# Patient Record
Sex: Female | Born: 1970 | Race: White | Hispanic: No | Marital: Married | Smoking: Former smoker
Health system: Southern US, Community
[De-identification: ages and names within clinical notes are randomized; demographics above are authoritative.]

## PROBLEM LIST (undated history)

## (undated) DIAGNOSIS — I639 Cerebral infarction, unspecified: Secondary | ICD-10-CM

## (undated) DIAGNOSIS — G4700 Insomnia, unspecified: Secondary | ICD-10-CM

## (undated) DIAGNOSIS — F4310 Post-traumatic stress disorder, unspecified: Secondary | ICD-10-CM

## (undated) DIAGNOSIS — I10 Essential (primary) hypertension: Secondary | ICD-10-CM

## (undated) DIAGNOSIS — E079 Disorder of thyroid, unspecified: Secondary | ICD-10-CM

## (undated) DIAGNOSIS — M5124 Other intervertebral disc displacement, thoracic region: Secondary | ICD-10-CM

## (undated) DIAGNOSIS — F319 Bipolar disorder, unspecified: Secondary | ICD-10-CM

## (undated) DIAGNOSIS — M4714 Other spondylosis with myelopathy, thoracic region: Secondary | ICD-10-CM

## (undated) DIAGNOSIS — K219 Gastro-esophageal reflux disease without esophagitis: Secondary | ICD-10-CM

## (undated) DIAGNOSIS — N189 Chronic kidney disease, unspecified: Secondary | ICD-10-CM

## (undated) DIAGNOSIS — G894 Chronic pain syndrome: Secondary | ICD-10-CM

## (undated) DIAGNOSIS — M961 Postlaminectomy syndrome, not elsewhere classified: Secondary | ICD-10-CM

## (undated) DIAGNOSIS — G5600 Carpal tunnel syndrome, unspecified upper limb: Secondary | ICD-10-CM

## (undated) DIAGNOSIS — J45909 Unspecified asthma, uncomplicated: Secondary | ICD-10-CM

## (undated) DIAGNOSIS — F32A Depression, unspecified: Secondary | ICD-10-CM

## (undated) DIAGNOSIS — G4730 Sleep apnea, unspecified: Secondary | ICD-10-CM

## (undated) DIAGNOSIS — E039 Hypothyroidism, unspecified: Secondary | ICD-10-CM

## (undated) DIAGNOSIS — T7840XA Allergy, unspecified, initial encounter: Secondary | ICD-10-CM

## (undated) DIAGNOSIS — F17200 Nicotine dependence, unspecified, uncomplicated: Secondary | ICD-10-CM

## (undated) DIAGNOSIS — E119 Type 2 diabetes mellitus without complications: Secondary | ICD-10-CM

## (undated) DIAGNOSIS — J189 Pneumonia, unspecified organism: Secondary | ICD-10-CM

## (undated) DIAGNOSIS — M1990 Unspecified osteoarthritis, unspecified site: Secondary | ICD-10-CM

## (undated) DIAGNOSIS — F419 Anxiety disorder, unspecified: Secondary | ICD-10-CM

## (undated) HISTORY — PX: ABDOMINAL HYSTERECTOMY: SHX81

## (undated) HISTORY — PX: TONSILLECTOMY: SUR1361

## (undated) HISTORY — PX: KNEE ARTHROSCOPY: SUR90

## (undated) HISTORY — DX: Depression, unspecified: F32.A

## (undated) HISTORY — PX: SPINE SURGERY: SHX786

## (undated) HISTORY — DX: Sleep apnea, unspecified: G47.30

## (undated) HISTORY — PX: CHOLECYSTECTOMY: SHX55

## (undated) HISTORY — DX: Essential (primary) hypertension: I10

## (undated) HISTORY — DX: Anxiety disorder, unspecified: F41.9

## (undated) HISTORY — DX: Unspecified osteoarthritis, unspecified site: M19.90

## (undated) HISTORY — DX: Unspecified asthma, uncomplicated: J45.909

## (undated) HISTORY — DX: Allergy, unspecified, initial encounter: T78.40XA

## (undated) HISTORY — DX: Bipolar disorder, unspecified: F31.9

## (undated) HISTORY — DX: Post-traumatic stress disorder, unspecified: F43.10

## (undated) HISTORY — PX: JOINT REPLACEMENT: SHX530

---
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
## 1991-10-28 HISTORY — PX: KNEE ARTHROSCOPY: SUR90

## 1996-10-27 HISTORY — PX: KNEE ARTHROSCOPY: SHX127

## 2000-07-02 ENCOUNTER — Emergency Department (HOSPITAL_COMMUNITY): Admission: EM | Admit: 2000-07-02 | Discharge: 2000-07-02 | Payer: Self-pay | Admitting: Emergency Medicine

## 2011-06-08 HISTORY — PX: CHOLECYSTECTOMY: SHX55

## 2011-12-03 HISTORY — PX: ABDOMINAL HYSTERECTOMY: SHX81

## 2019-10-28 HISTORY — PX: LUMBAR LAMINECTOMY: SHX95

## 2019-10-28 HISTORY — PX: BACK SURGERY: SHX140

## 2020-05-23 ENCOUNTER — Ambulatory Visit: Payer: Self-pay | Attending: Internal Medicine

## 2020-05-23 DIAGNOSIS — Z20822 Contact with and (suspected) exposure to covid-19: Secondary | ICD-10-CM

## 2020-05-24 LAB — SARS-COV-2, NAA 2 DAY TAT

## 2020-05-24 LAB — NOVEL CORONAVIRUS, NAA: SARS-CoV-2, NAA: NOT DETECTED

## 2020-06-24 ENCOUNTER — Encounter: Payer: Self-pay | Admitting: Emergency Medicine

## 2020-06-24 ENCOUNTER — Other Ambulatory Visit: Payer: Self-pay

## 2020-06-24 ENCOUNTER — Ambulatory Visit
Admission: EM | Admit: 2020-06-24 | Discharge: 2020-06-24 | Disposition: A | Discharge status: Home / Self Care | Payer: 59 | Attending: Family Medicine | Admitting: Family Medicine

## 2020-06-24 DIAGNOSIS — Z79899 Other long term (current) drug therapy: Secondary | ICD-10-CM | POA: Diagnosis not present

## 2020-06-24 DIAGNOSIS — Z20822 Contact with and (suspected) exposure to covid-19: Secondary | ICD-10-CM | POA: Diagnosis not present

## 2020-06-24 DIAGNOSIS — K219 Gastro-esophageal reflux disease without esophagitis: Secondary | ICD-10-CM | POA: Insufficient documentation

## 2020-06-24 DIAGNOSIS — R112 Nausea with vomiting, unspecified: Secondary | ICD-10-CM | POA: Insufficient documentation

## 2020-06-24 DIAGNOSIS — E119 Type 2 diabetes mellitus without complications: Secondary | ICD-10-CM | POA: Insufficient documentation

## 2020-06-24 DIAGNOSIS — B349 Viral infection, unspecified: Secondary | ICD-10-CM | POA: Diagnosis not present

## 2020-06-24 DIAGNOSIS — E079 Disorder of thyroid, unspecified: Secondary | ICD-10-CM | POA: Insufficient documentation

## 2020-06-24 DIAGNOSIS — Z87891 Personal history of nicotine dependence: Secondary | ICD-10-CM | POA: Insufficient documentation

## 2020-06-24 DIAGNOSIS — H9202 Otalgia, left ear: Secondary | ICD-10-CM | POA: Insufficient documentation

## 2020-06-24 DIAGNOSIS — R0989 Other specified symptoms and signs involving the circulatory and respiratory systems: Secondary | ICD-10-CM | POA: Diagnosis not present

## 2020-06-24 DIAGNOSIS — Z7984 Long term (current) use of oral hypoglycemic drugs: Secondary | ICD-10-CM | POA: Insufficient documentation

## 2020-06-24 HISTORY — DX: Disorder of thyroid, unspecified: E07.9

## 2020-06-24 HISTORY — DX: Gastro-esophageal reflux disease without esophagitis: K21.9

## 2020-06-24 HISTORY — DX: Type 2 diabetes mellitus without complications: E11.9

## 2020-06-24 MED ORDER — PROMETHAZINE HCL 25 MG PO TABS: 25.0000 mg | ORAL_TABLET | Freq: Four times a day (QID) | ORAL | 0 refills | Status: DC | PRN

## 2020-06-24 MED ORDER — ONDANSETRON 4 MG PO TBDP: 4.0000 mg | ORAL_TABLET | Freq: Three times a day (TID) | ORAL | 0 refills | Status: DC | PRN

## 2020-06-24 MED ORDER — NEOMYCIN-POLYMYXIN-HC 3.5-10000-1 OT SUSP: 4.0000 [drp] | Freq: Three times a day (TID) | OTIC | 0 refills | Status: AC

## 2020-06-24 NOTE — Discharge Instructions (Addendum)
Rest.  Fluids.  Meds as prescribed.  Call health at work.  Take care  Dr. Lacinda Axon

## 2020-06-24 NOTE — ED Provider Notes (Signed)
[facility]    CSN: [id number] Arrival date & time: 06/24/20  0177      History   Chief Complaint Chief Complaint  Patient presents with   Nausea   Emesis   Otalgia   HPI  49 year old female presents with the above complaints.  Patient reports that she developed nausea and vomiting which started on Saturday.  She had 2 episodes of emesis.  She had an episode of nausea vomiting again today.  Unclear inciting factor.  No fever.  No chills.  No reports of abdominal pain.  No diarrhea.  She does note that she has a scratchy throat.  She reports that she has had left ear pain as of today as well.  She denies respiratory symptoms.  She has taken Zofran without relief.  No other complaints or concerns at this time.  Past Medical History:  Diagnosis Date   Diabetes mellitus without complication (HCC)    GERD (gastroesophageal reflux disease)    Thyroid disease    Past Surgical History:  Procedure Laterality Date   ABDOMINAL HYSTERECTOMY     CHOLECYSTECTOMY     TONSILLECTOMY     OB History   No obstetric history on file.    Home Medications    Prior to Admission medications   Medication Sig Start Date End Date Taking? Authorizing Provider  cholecalciferol (VITAMIN D3) 25 MCG (1000 UNIT) tablet Take 1,000 Units by mouth daily.   Yes [provider]  levothyroxine (SYNTHROID) 50 MCG tablet Take 50 mcg by mouth daily before breakfast.   Yes [provider]  metFORMIN (GLUCOPHAGE) 1000 MG tablet Take 1,000 mg by mouth daily with breakfast.   Yes [provider]  pantoprazole (PROTONIX) 40 MG tablet Take 40 mg by mouth daily.   Yes [provider]  QUEtiapine (SEROQUEL) 300 MG tablet Take 300 mg by mouth at bedtime.   Yes [provider]  rosuvastatin (CRESTOR) 5 MG tablet Take 5 mg by mouth daily.   Yes [provider]  sertraline (ZOLOFT) 100 MG tablet Take 100 mg by mouth daily.   Yes [provider]  sitaGLIPtin (JANUVIA) 100 MG tablet Take 100 mg by mouth daily.   Yes [provider]  zolpidem (AMBIEN) 10 MG tablet Take 10 mg by mouth at bedtime as needed for sleep.   Yes [provider]  neomycin-polymyxin-hydrocortisone (CORTISPORIN) 3.5-10000-1 OTIC suspension Place 4 drops into the left ear 3 (three) times daily for 7 days. 06/24/20 07/01/20  Coral Spikes, DO  ondansetron (ZOFRAN ODT) 4 MG disintegrating tablet Take 1 tablet (4 mg total) by mouth every 8 (eight) hours as needed for nausea or vomiting. 06/24/20   Coral Spikes, DO  promethazine (PHENERGAN) 25 MG tablet Take 1 tablet (25 mg total) by mouth every 6 (six) hours as needed for refractory nausea / vomiting. 06/24/20   Coral Spikes, DO    Family History Family History  Problem Relation Age of Onset   Hypertension Mother     Social History Social History   Tobacco Use   Smoking status: Former Smoker   Smokeless tobacco: Never Used  Scientific laboratory technician Use: Never used  Substance Use Topics   Alcohol use: Not Currently   Drug use: Not on file     Allergies   Sulfa antibiotics   Review of Systems Review of Systems  Constitutional: Negative for fever.  HENT: Positive for ear pain.   Gastrointestinal: Positive for  nausea and vomiting.   Physical Exam Triage Vital Signs ED Triage Vitals  Enc Vitals Group     BP 06/24/20 0941 120/66     Pulse Rate 06/24/20 0941 86     Resp 06/24/20 0941 14     Temp 06/24/20 0941 98.4 F (36.9 C)     Temp Source 06/24/20 0941 Oral     SpO2 06/24/20 0941 96 %     Weight 06/24/20 0936 172 lb (78 kg)     Height 06/24/20 0936 5\' 4"  (1.626 m)     Head Circumference --      Peak Flow --      Pain Score 06/24/20 0935 3     Pain Loc --      Pain Edu? --      Excl. in [place]? --    Updated Vital Signs BP 120/66 (BP Location: Left Arm)    Pulse 86    Temp 98.4 F (36.9 C) (Oral)    Resp 14    Ht 5\' 4"  (1.626 m)    Wt 78 kg    SpO2 96%    BMI  29.52 kg/m   Visual Acuity Right Eye Distance:   Left Eye Distance:   Bilateral Distance:    Right Eye Near:   Left Eye Near:    Bilateral Near:     Physical Exam Vitals and nursing note reviewed.  Constitutional:      General: She is not in acute distress.    Appearance: Normal appearance. She is not ill-appearing.  HENT:     Head: Normocephalic and atraumatic.  Eyes:     General:        Right eye: No discharge.        Left eye: No discharge.     Conjunctiva/sclera: Conjunctivae normal.  Cardiovascular:     Rate and Rhythm: Normal rate.     Heart sounds: No murmur heard.   Pulmonary:     Effort: Pulmonary effort is normal.     Breath sounds: Normal breath sounds. No wheezing, rhonchi or rales.  Abdominal:     General: There is no distension.     Palpations: Abdomen is soft.     Comments: Epigastric tenderness to palpation.  Neurological:     Mental Status: She is alert.  Psychiatric:        Mood and Affect: Mood normal.        Behavior: Behavior normal.    UC Treatments / Results  Labs (all labs ordered are listed, but only abnormal results are displayed) Labs Reviewed  SARS CORONAVIRUS 2 (TAT 6-24 HRS)    EKG   Radiology No results found.  Procedures Procedures (including critical care time)  Medications Ordered in UC Medications - No data to display  Initial Impression / Assessment and Plan / UC Course  I have reviewed the triage vital signs and the nursing notes.  Pertinent labs & imaging results that were available during my care of the patient were reviewed by me and considered in my medical decision making (see chart for details).    49 year old female presents with suspected viral illness.  Zofran as needed for nausea and vomiting.  Phenergan for refractory nausea/vomiting.  Cortisporin otic for the left ear. Awaiting COVID test results.   Final Clinical Impressions(s) / UC Diagnoses   Final diagnoses:  Viral illness     Discharge  Instructions     Rest.  Fluids.  Meds as prescribed.  Call health  at work.  Take care  Dr. Lacinda Axon    ED Prescriptions    Medication Sig Dispense Auth. Provider   promethazine (PHENERGAN) 25 MG tablet Take 1 tablet (25 mg total) by mouth every 6 (six) hours as needed for refractory nausea / vomiting. 30 tablet Gennavieve Huq G, DO   ondansetron (ZOFRAN ODT) 4 MG disintegrating tablet Take 1 tablet (4 mg total) by mouth every 8 (eight) hours as needed for nausea or vomiting. 20 tablet Starlin Steib G, DO   neomycin-polymyxin-hydrocortisone (CORTISPORIN) 3.5-10000-1 OTIC suspension Place 4 drops into the left ear 3 (three) times daily for 7 days. 10 mL Coral Spikes, DO     PDMP not reviewed this encounter.   Coral Spikes, DO 06/24/20 1012

## 2020-06-24 NOTE — ED Triage Notes (Signed)
Patient c/o N/V that started Saturday.  Patient c/o left ear pain that started today.  Patient denies fevers.

## 2020-06-25 LAB — SARS CORONAVIRUS 2 (TAT 6-24 HRS): SARS Coronavirus 2: NEGATIVE

## 2020-07-13 ENCOUNTER — Telehealth: Payer: Self-pay

## 2020-07-13 NOTE — Telephone Encounter (Signed)
Copied from [place] [id number]. Topic: Appointment Scheduling - Scheduling Inquiry for Clinic >> Jul 12, 2020 10:06 AM Scherrie Gerlach wrote: Reason for CRM: Timia Casselman is married to this pt, and Lattie Haw sees Dr Caryn Section.  Pt is a [employer] employee. Lattie Haw would like to know if anyone there would be willing to take this pt on as a new pt?

## 2020-07-13 NOTE — Telephone Encounter (Signed)
Dr. Caryn Section is not accepting any new patients. Are there any other providers in the office who are accepting new patients? Please let this patient know.

## 2020-07-16 NOTE — Telephone Encounter (Signed)
Appt. Made with Laverna Peace, FNP for 08/01/20 at 3:00 p.m.

## 2020-07-31 ENCOUNTER — Other Ambulatory Visit: Payer: Self-pay

## 2020-07-31 ENCOUNTER — Ambulatory Visit
Admission: EM | Admit: 2020-07-31 | Discharge: 2020-07-31 | Disposition: A | Discharge status: Home / Self Care | Payer: 59 | Attending: Internal Medicine | Admitting: Internal Medicine

## 2020-07-31 DIAGNOSIS — S336XXA Sprain of sacroiliac joint, initial encounter: Secondary | ICD-10-CM

## 2020-07-31 MED ORDER — METHOCARBAMOL 500 MG PO TABS: 500.0000 mg | ORAL_TABLET | Freq: Two times a day (BID) | ORAL | 0 refills | Status: DC

## 2020-07-31 MED ORDER — IBUPROFEN 600 MG PO TABS: 600.0000 mg | ORAL_TABLET | Freq: Four times a day (QID) | ORAL | 0 refills | Status: DC | PRN

## 2020-07-31 MED ORDER — KETOROLAC TROMETHAMINE 60 MG/2ML IM SOLN
60.0000 mg | Freq: Once | INTRAMUSCULAR | Status: AC
  Administered 2020-07-31: 60 mg via INTRAMUSCULAR

## 2020-07-31 NOTE — ED Triage Notes (Signed)
Pt reports on Sunday a donkey bucked her and she injured her low back.

## 2020-07-31 NOTE — ED Provider Notes (Signed)
[facility]    CSN: [id number] Arrival date & time: 07/31/20  1741      History   Chief Complaint Chief Complaint  Patient presents with   Back Pain    HPI Kathleen Carr is a 49 y.o. female.   49 yo female here for left low back pain after being hit by her donkey. She says she was feeding the horses and did not see the donkey behind her. The donkey hit her and she fell back jarring her back. She has been using Lidocaine patches and getting about 3 hours of relief.      Past Medical History:  Diagnosis Date   Diabetes mellitus without complication (HCC)    GERD (gastroesophageal reflux disease)    Thyroid disease     There are no problems to display for this patient.   Past Surgical History:  Procedure Laterality Date   ABDOMINAL HYSTERECTOMY     CHOLECYSTECTOMY     TONSILLECTOMY      OB History   No obstetric history on file.      Home Medications    Prior to Admission medications   Medication Sig Start Date End Date Taking? Authorizing Provider  cholecalciferol (VITAMIN D3) 25 MCG (1000 UNIT) tablet Take 1,000 Units by mouth daily.    [provider]  levothyroxine (SYNTHROID) 50 MCG tablet Take 50 mcg by mouth daily before breakfast.    [provider]  metFORMIN (GLUCOPHAGE) 1000 MG tablet Take 1,000 mg by mouth daily with breakfast.    [provider]  ondansetron (ZOFRAN ODT) 4 MG disintegrating tablet Take 1 tablet (4 mg total) by mouth every 8 (eight) hours as needed for nausea or vomiting. 06/24/20   Coral Spikes, DO  pantoprazole (PROTONIX) 40 MG tablet Take 40 mg by mouth daily.    [provider]  promethazine (PHENERGAN) 25 MG tablet Take 1 tablet (25 mg total) by mouth every 6 (six) hours as needed for refractory nausea / vomiting. 06/24/20   Coral Spikes, DO  QUEtiapine (SEROQUEL) 300 MG tablet Take 300 mg by mouth at bedtime.    [provider]  rosuvastatin (CRESTOR) 5 MG tablet Take  5 mg by mouth daily.    [provider]  sertraline (ZOLOFT) 100 MG tablet Take 100 mg by mouth daily.    [provider]  sitaGLIPtin (JANUVIA) 100 MG tablet Take 100 mg by mouth daily.    [provider]  zolpidem (AMBIEN) 10 MG tablet Take 10 mg by mouth at bedtime as needed for sleep.    [provider]    Family History Family History  Problem Relation Age of Onset   Hypertension Mother     Social History Social History   Tobacco Use   Smoking status: Former Smoker   Smokeless tobacco: Never Used  Scientific laboratory technician Use: Never used  Substance Use Topics   Alcohol use: Not Currently   Drug use: Not on file     Allergies   Sulfa antibiotics   Review of Systems Review of Systems  Constitutional: Negative for activity change and fever.  HENT: Negative for congestion and rhinorrhea.   Respiratory: Negative for cough.   Cardiovascular: Negative for chest pain.  Gastrointestinal: Negative for nausea and vomiting.  Musculoskeletal: Positive for back pain and myalgias. Negative for arthralgias and joint swelling.  Skin: Negative.   Neurological: Negative for weakness and numbness.  Hematological: Negative.   Psychiatric/Behavioral: Negative.  Physical Exam Triage Vital Signs ED Triage Vitals  Enc Vitals Group     BP 07/31/20 1752 (!) 131/94     Pulse Rate 07/31/20 1752 75     Resp 07/31/20 1752 18     Temp 07/31/20 1752 98.6 F (37 C)     Temp Source 07/31/20 1752 Oral     SpO2 07/31/20 1752 98 %     Weight 07/31/20 1751 171 lb 15.3 oz (78 kg)     Height 07/31/20 1751 5\' 4"  (1.626 m)     Head Circumference --      Peak Flow --      Pain Score 07/31/20 1750 8     Pain Loc --      Pain Edu? --      Excl. in [place]? --    No data found.  Updated Vital Signs BP (!) 131/94 (BP Location: Left Arm)    Pulse 75    Temp 98.6 F (37 C) (Oral)    Resp 18    Ht 5\' 4"  (1.626 m)    Wt 171 lb 15.3 oz (78 kg)    SpO2 98%     BMI 29.52 kg/m   Visual Acuity Right Eye Distance:   Left Eye Distance:   Bilateral Distance:    Right Eye Near:   Left Eye Near:    Bilateral Near:     Physical Exam Vitals and nursing note reviewed.  Constitutional:      General: She is not in acute distress.    Appearance: Normal appearance.  HENT:     Head: Normocephalic and atraumatic.  Pulmonary:     Effort: Pulmonary effort is normal.     Breath sounds: Normal breath sounds.  Musculoskeletal:     Comments: There is no midline tenderness noted in lumbar of sacral spine. There is soft tissue swelling over the posterior, superior sacroiliac ligament  Neurological:     Mental Status: She is alert.      UC Treatments / Results  Labs (all labs ordered are listed, but only abnormal results are displayed) Labs Reviewed - No data to display  EKG   Radiology No results found.  Procedures Procedures (including critical care time)  Medications Ordered in UC Medications  ketorolac (TORADOL) injection 60 mg (60 mg Intramuscular Given 07/31/20 1819)    Initial Impression / Assessment and Plan / UC Course  I have reviewed the triage vital signs and the nursing notes.  Pertinent labs & imaging results that were available during my care of the patient were reviewed by me and considered in my medical decision making (see chart for details).   Patient is here for evaluation of left low back pain that started this weekend after being bucked by a donkey. She has normal gait, no bony tenderness. On exam her sacroiliac ligament is inflamed.  Will D/c home with Ibuprofen, moist heat, and stretches.    Final Clinical Impressions(s) / UC Diagnoses   Final diagnoses:  Sacroiliac (ligament) sprain, initial encounter     Discharge Instructions     Take Ibuprofen 600 mg every 6 hours as needed for pain.  Apply moist heat for 20 minutes at a time, 2-3 times a day.  Continue to use the Lidocaine patches.  Return for  worsening symptoms.     ED Prescriptions    None     PDMP not reviewed this encounter.   Margarette Canada, NP 07/31/20 [phone]

## 2020-07-31 NOTE — Discharge Instructions (Addendum)
Take Ibuprofen 600 mg every 6 hours as needed for pain.  Apply moist heat for 20 minutes at a time, 2-3 times a day.  Continue to use the Lidocaine patches.  Return for worsening symptoms.

## 2020-08-01 ENCOUNTER — Ambulatory Visit (INDEPENDENT_AMBULATORY_CARE_PROVIDER_SITE_OTHER): Payer: 59 | Admitting: Adult Health

## 2020-08-01 ENCOUNTER — Encounter: Payer: Self-pay | Admitting: Adult Health

## 2020-08-01 VITALS — BP 124/97 | HR 77 | Temp 97.5°F | Resp 16 | Wt 178.4 lb

## 2020-08-01 DIAGNOSIS — M545 Low back pain, unspecified: Secondary | ICD-10-CM

## 2020-08-01 DIAGNOSIS — Z6830 Body mass index (BMI) 30.0-30.9, adult: Secondary | ICD-10-CM

## 2020-08-01 DIAGNOSIS — S3992XA Unspecified injury of lower back, initial encounter: Secondary | ICD-10-CM

## 2020-08-01 DIAGNOSIS — S7002XA Contusion of left hip, initial encounter: Secondary | ICD-10-CM | POA: Diagnosis not present

## 2020-08-01 LAB — POCT URINALYSIS DIPSTICK (MANUAL)
Leukocytes, UA: NEGATIVE
Nitrite, UA: NEGATIVE
Poct Bilirubin: NEGATIVE
Poct Blood: NEGATIVE
Poct Glucose: NORMAL mg/dL
Poct Ketones: NEGATIVE
Poct Protein: NEGATIVE mg/dL
Poct Urobilinogen: NORMAL mg/dL
Spec Grav, UA: <=1.005 — AB (ref 1.010–1.025)
pH, UA: 6 (ref 5.0–8.0)

## 2020-08-01 NOTE — Progress Notes (Addendum)
New patient visit   Patient: Kathleen Carr   DOB: 1971/05/11   49 y.o. Female  MRN: [id number] Visit Date: 08/01/2020  Today's healthcare provider: Marcille Buffy, FNP   Chief Complaint  Patient presents with  . New Patient (Initial Visit)   Subjective    Kathleen Carr is a 49 y.o. female who presents today as a new patient to establish care.  HPI  Patient comes to our office from [place], she states that she feels fairly well today but would like to address acute back pain. Patient reports that 4 days ago a  [place] had ran into the back of her, she reports being seen at [facility]gent care last night and given injection of Torodal and prescribed Methocarbamol 500mg . Patient reports today that pain is still present and medication prescribed has not helped with pain in lumbar area.   She moved here in June from [place].   She reports her donkey ran into her on  07/29/2020 while she was feeding her donkey. She was seen at the [facility] urgent care on 07/31/2020 and given Robaxin and Toradol and Ibuprofen of which she has not taken Ibuprfen today.  She was not x rayed at urgent care. Felt no relief from Robaxin. She reprts pain in her left groin when she is turning her back.  Numbness into left thigh and buttock she reports is always there and never goes away.  Kathleen Carr is a 49 y.o. female who presents today as a new patient to establish care.  HPI  Patient comes to our office from [place], she states that she feels fairly well today but would like to address acute back pain. Patient reports that 4 days ago a  [place] had ran into the back of her, she reports being seen at [facility] last night and given injection of Torodal and prescribed Methocarbamol 500mg . Patient reports today that pain is still present and medication prescribed has not helped with pain in lumbar area.   She moved here in June from [place].   She reports her donkey ran into her on  07/29/2020 while she was  feeding her donkey. She was seen at the [facility] urgent care on 07/31/2020 and given Robaxin and Toradol and Ibuprofen of which she has not taken Ibuprfen today.  She was not x rayed at urgent care. Felt no relief from Robaxin. She reprts pain in her left groin when she is turning her back.  Numbness into left thigh and buttock she reports is always there and never goes away. She reports she has felt a crunching in her lower back.   Patient  denies any fever, chills, rash, chest pain, shortness of breath, nausea, vomiting, or diarrhea.  Denies dizziness, lightheadedness, pre syncopal or syncopal episodes.    Past Medical History:  Diagnosis Date  . Allergy   . Anxiety   . Arthritis   . Asthma   . Bipolar depression ([place])   . Depression   . Diabetes mellitus without complication ([place])   . GERD (gastroesophageal reflux disease)   . Hypertension   . PTSD (post-traumatic stress disorder)   . PTSD (post-traumatic stress disorder)   . Thyroid disease    Past Surgical History:  Procedure Laterality Date  . ABDOMINAL HYSTERECTOMY    . CHOLECYSTECTOMY    . TONSILLECTOMY     Family Status  Relation Name Status  . Mother  Alive  . Father  Alive  . MGM  (Not  Specified)  . MGF  (Not Specified)  . PGF  (Not Specified)   Family History  Problem Relation Age of Onset  . Hypertension Mother   . Thyroid disease Mother   . Ulcerative colitis Father   . Glaucoma Maternal Grandmother   . Renal Disease Maternal Grandfather   . Diabetes Maternal Grandfather        Type2  . Stroke Paternal Grandfather   . Hypertension Paternal Grandfather    Social History   Socioeconomic History  . Marital status: Married    Spouse name: Not on file  . Number of children: Not on file  . Years of education: Not on file  . Highest education level: Not on file  Occupational History  . Not on file  Tobacco Use  . Smoking status: Former Smoker    Quit date: 05/25/2020    Years since quitting: 0.1  .  Smokeless tobacco: Never Used  Vaping Use  . Vaping Use: Never used  Substance and Sexual Activity  . Alcohol use: Not Currently  . Drug use: Not on file  . Sexual activity: Not on file  Other Topics Concern  . Not on file  Social History Narrative  . Not on file   Social Determinants of Health   Financial Resource Strain:   . Difficulty of Paying Living Expenses: Not on file  Food Insecurity:   . Worried About Charity fundraiser in the Last Year: Not on file  . Ran Out of Food in the Last Year: Not on file  Transportation Needs:   . Lack of Transportation (Medical): Not on file  . Lack of Transportation (Non-Medical): Not on file  Physical Activity:   . Days of Exercise per Week: Not on file  . Minutes of Exercise per Session: Not on file  Stress:   . Feeling of Stress : Not on file  Social Connections:   . Frequency of Communication with Friends and Family: Not on file  . Frequency of Social Gatherings with Friends and Family: Not on file  . Attends Religious Services: Not on file  . Active Member of Clubs or Organizations: Not on file  . Attends Archivist Meetings: Not on file  . Marital Status: Not on file   Outpatient Medications Prior to Visit  Medication Sig  . cholecalciferol (VITAMIN D3) 25 MCG (1000 UNIT) tablet Take 1,000 Units by mouth daily.  Marland Kitchen ibuprofen (ADVIL) 600 MG tablet Take 1 tablet (600 mg total) by mouth every 6 (six) hours as needed.  Marland Kitchen levothyroxine (SYNTHROID) 50 MCG tablet Take 50 mcg by mouth daily before breakfast.  . metFORMIN (GLUCOPHAGE) 1000 MG tablet Take 1,000 mg by mouth daily with breakfast.  . methocarbamol (ROBAXIN) 500 MG tablet Take 1 tablet (500 mg total) by mouth 2 (two) times daily.  . pantoprazole (PROTONIX) 40 MG tablet Take 40 mg by mouth daily.  . QUEtiapine (SEROQUEL) 300 MG tablet Take 300 mg by mouth at bedtime.  . rosuvastatin (CRESTOR) 5 MG tablet Take 5 mg by mouth daily.  . sertraline (ZOLOFT) 100 MG tablet  Take 100 mg by mouth daily.  . sitaGLIPtin (JANUVIA) 100 MG tablet Take 100 mg by mouth daily.  Marland Kitchen zolpidem (AMBIEN) 10 MG tablet Take 10 mg by mouth at bedtime as needed for sleep.  . [DISCONTINUED] ondansetron (ZOFRAN ODT) 4 MG disintegrating tablet Take 1 tablet (4 mg total) by mouth every 8 (eight) hours as needed for nausea or vomiting.  . [  DISCONTINUED] promethazine (PHENERGAN) 25 MG tablet Take 1 tablet (25 mg total) by mouth every 6 (six) hours as needed for refractory nausea / vomiting.   No facility-administered medications prior to visit.   Allergies  Allergen Reactions  . Dust Mite Mixed Allergen Ext [Mite (D. Farinae)]     Respiratory distresss  . Other     Allergy to Hickory, walnut and Birch trees and all grasses and allergic to Rabbits- patient reports anaphylactic   . Sulfa Antibiotics Hives     There is no immunization history on file for this patient.  Health Maintenance  Topic Date Due  . COVID-19 Vaccine (1) Never done  . PAP SMEAR-Modifier  08/05/2020 (Originally 09/23/1992)  . INFLUENZA VACCINE  01/24/2021 (Originally 05/27/2020)  . TETANUS/TDAP  08/05/2021 (Originally 09/23/1990)  . Hepatitis C Screening  08/05/2021 (Originally October 21, 1971)  . HIV Screening  08/05/2021 (Originally 09/23/1986)    Patient Care Team: Diontre Harps, Kelby Aline, FNP as PCP - General (Family Medicine)  Review of Systems  Constitutional: Negative.   HENT: Negative.   Respiratory: Negative.   Cardiovascular: Negative.   Gastrointestinal: Negative.   Genitourinary: Positive for urgency. Negative for decreased urine volume, difficulty urinating, dyspareunia, dysuria, enuresis, flank pain, frequency, genital sores, hematuria, menstrual problem, pelvic pain and vaginal bleeding.  Musculoskeletal: Positive for back pain. Negative for arthralgias, gait problem, joint swelling, myalgias, neck pain and neck stiffness.  Allergic/Immunologic: Positive for environmental allergies.  Neurological:  Positive for numbness. Negative for dizziness, tremors, seizures, syncope, facial asymmetry, speech difficulty, weakness, light-headedness and headaches.  Hematological: Negative.   Psychiatric/Behavioral: Negative.   All other systems reviewed and are negative.     Objective    BP (!) 124/97   Pulse 77   Temp (!) 97.5 F (36.4 C) (Oral)   Resp 16   Wt 178 lb 6.4 oz (80.9 kg)   SpO2 100%   BMI 30.62 kg/m  Physical Exam BP (!) 124/97   Pulse 77   Temp (!) 97.5 F (36.4 C) (Oral)   Resp 16   Wt 178 lb 6.4 oz (80.9 kg)   SpO2 100%   BMI 30.62 kg/m  Physical Exam Vitals reviewed.  Constitutional:      General: She is not in acute distress.    Appearance: Normal appearance. She is not ill-appearing, toxic-appearing or diaphoretic.  HENT:     Head: Normocephalic and atraumatic.     Right Ear: Tympanic membrane, ear canal and external ear normal. There is no impacted cerumen.     Left Ear: Tympanic membrane, ear canal and external ear normal.     Nose: Nose normal. No congestion or rhinorrhea.     Mouth/Throat:     Mouth: Mucous membranes are moist.     Pharynx: No oropharyngeal exudate or posterior oropharyngeal erythema.  Eyes:     General: No scleral icterus.       Right eye: No discharge.        Left eye: No discharge.     Extraocular Movements: Extraocular movements intact.     Conjunctiva/sclera: Conjunctivae normal.     Pupils: Pupils are equal, round, and reactive to light.  Neck:     Vascular: No carotid bruit.  Cardiovascular:     Rate and Rhythm: Normal rate and regular rhythm.     Pulses: Normal pulses.     Heart sounds: Normal heart sounds. No murmur heard.  No friction rub. No gallop.   Pulmonary:     Effort: Pulmonary effort  is normal. No respiratory distress.     Breath sounds: Normal breath sounds. No stridor. No wheezing, rhonchi or rales.  Chest:     Chest wall: No tenderness.  Abdominal:     General: There is no distension.     Palpations:  Abdomen is soft.     Tenderness: There is no abdominal tenderness. There is no right CVA tenderness or left CVA tenderness.  Genitourinary:    Comments: Patient is alert and oriented and responsive to questions Engages in eye contact with provider. Speaks in full sentences without any pauses without any shortness of breath or distress.   Musculoskeletal:        General: Tenderness and signs of injury present.     Cervical back: Normal, normal range of motion and neck supple. No rigidity or tenderness.     Thoracic back: Normal.     Lumbar back: No swelling, edema, deformity, signs of trauma, lacerations, spasms or bony tenderness. Decreased range of motion. Positive left straight leg raise test (patient grimaces and hollars with any raise. ). Negative right straight leg raise test. No scoliosis.     Right lower leg: No edema.     Left lower leg: No edema.     Comments: No bruising or rash noted.  She is able to get on exam table.   Lymphadenopathy:     Cervical: No cervical adenopathy.  Skin:    General: Skin is warm.     Capillary Refill: Capillary refill takes less than 2 seconds.     Findings: No erythema or rash.  Neurological:     General: No focal deficit present.     Mental Status: She is alert and oriented to person, place, and time.     Coordination: Coordination normal.     Deep Tendon Reflexes: Reflexes normal.  Psychiatric:        Mood and Affect: Mood normal.        Behavior: Behavior normal.        Thought Content: Thought content normal.        Judgment: Judgment normal.    Depression Screen PHQ 2/9 Scores 08/01/2020  PHQ - 2 Score 0  PHQ- 9 Score 3   Results for orders placed or performed in visit on 08/01/20  TSH  Result Value Ref Range   TSH 2.420 0.450 - 4.500 uIU/mL  CBC with Differential/Platelet  Result Value Ref Range   WBC 10.1 3.4 - 10.8 x10E3/uL   RBC 4.68 3.77 - 5.28 x10E6/uL   Hemoglobin 15.2 11.1 - 15.9 g/dL   Hematocrit 44.0 34.0 - 46.6 %     MCV 94 79 - 97 fL   MCH 32.5 26.6 - 33.0 pg   MCHC 34.5 31 - 35 g/dL   RDW 12.1 11.7 - 15.4 %   Platelets 409 150 - 450 x10E3/uL   Neutrophils 39 Not Estab. %   Lymphs 51 Not Estab. %   Monocytes 6 Not Estab. %   Eos 2 Not Estab. %   Basos 1 Not Estab. %   Neutrophils Absolute 4.0 1 - 7 x10E3/uL   Lymphocytes Absolute 5.3 (H) 0 - 3 x10E3/uL   Monocytes Absolute 0.6 0 - 0 x10E3/uL   EOS (ABSOLUTE) 0.2 0.0 - 0.4 x10E3/uL   Basophils Absolute 0.1 0 - 0 x10E3/uL   Immature Granulocytes 1 Not Estab. %   Immature Grans (Abs) 0.1 0.0 - 0.1 x10E3/uL  Comprehensive Metabolic Panel (CMET)  Result Value Ref Range  Glucose 92 65 - 99 mg/dL   BUN 13 6 - 24 mg/dL   Creatinine, Ser 0.88 0.57 - 1.00 mg/dL   GFR calc non Af Amer 78 >59 mL/min/1.73   GFR calc Af Amer 90 >59 mL/min/1.73   BUN/Creatinine Ratio 15 9 - 23   Sodium 137 134 - 144 mmol/L   Potassium 4.7 3.5 - 5.2 mmol/L   Chloride 100 96 - 106 mmol/L   CO2 22 20 - 29 mmol/L   Calcium 9.8 8.7 - 10.2 mg/dL   Total Protein 7.2 6.0 - 8.5 g/dL   Albumin 5.0 (H) 3.8 - 4.8 g/dL   Globulin, Total 2.2 1.5 - 4.5 g/dL   Albumin/Globulin Ratio 2.3 (H) 1.2 - 2.2   Bilirubin Total 0.2 0.0 - 1.2 mg/dL   Alkaline Phosphatase 74 44 - 121 IU/L   AST 18 0 - 40 IU/L   ALT 17 0 - 32 IU/L  HgB A1c  Result Value Ref Range   Hgb A1c MFr Bld 5.9 (H) 4.8 - 5.6 %   Est. average glucose Bld gHb Est-mCnc 123 mg/dL  Lipid Panel w/o Chol/HDL Ratio  Result Value Ref Range   Cholesterol, Total 193 100 - 199 mg/dL   Triglycerides 151 (H) 0 - 149 mg/dL   HDL 61 >39 mg/dL   VLDL Cholesterol Cal 26 5 - 40 mg/dL   LDL Chol Calc (NIH) 106 (H) 0 - 99 mg/dL  POCT Urinalysis Dip Manual  Result Value Ref Range   Spec Grav, UA <=1.005 (A) 1.010 - 1.025   pH, UA 6.0 5.0 - 8.0   Leukocytes, UA Negative Negative   Nitrite, UA Negative Negative   Poct Protein Negative Negative, trace mg/dL   Poct Glucose Normal Normal mg/dL   Poct Ketones Negative Negative    Poct Urobilinogen Normal Normal mg/dL   Poct Bilirubin Negative Negative   Poct Blood Negative Negative, trace    Assessment & Plan     She is advised to go to [facility] for further work up on her back given the amount of pain she has with straight left raise leg. She will go now to [facility].   .    Orders Placed This Encounter  Procedures  . TSH  . CBC with Differential/Platelet  . Comprehensive Metabolic Panel (CMET)  . HgB A1c  . Lipid Panel w/o Chol/HDL Ratio  . POCT Urinalysis Dip Manual    Labs ordered.  Needs CPE in future.  Return in about 1 week (around 08/08/2020), or if symptoms worsen or fail to improve, for at any time for any worsening symptoms, Go to Emergency room/ urgent care if wors    Addressed acute and or chronic medical problems today requiring over 40  minutes reviewing patients medical record,labs, counseling patient regarding patient's conditions, any medications, answering questions regarding health, and coordination of care as needed. After visit summary patient given copy and reviewed.  There are other unrelated non-urgent complaints, but due to the busy schedule and the amount of time I've already spent with her, time does not permit me to address these routine issues at today's visit. I've requested another appointment to review these additional issues.   Red Flags discussed. The patient was given clear instructions to go to ER or return to medical center if any red flags develop, symptoms do not improve, worsen or new problems develop. They verbalized understanding.   Marcille Buffy, [place] [phone] (phone) [phone] (fax)  [place]

## 2020-08-01 NOTE — Patient Instructions (Addendum)
Walk in at [facility] now  [phone] [employer] reviews Orthopedic clinic in [place], [place] COVID-19 info: [employer] [email or web address] Get online care: [email or web address] Address: [address], [place], [place] [address] Hours:  Open ? Closes 9PM Updated by business 3 weeks ago  Phone: [phone] Appointments: [email or web address]  Health Maintenance, Female Adopting a healthy lifestyle and getting preventive care are important in promoting health and wellness. Ask your health care provider about:  The right schedule for you to have regular tests and exams.  Things you can do on your own to prevent diseases and keep yourself healthy. What should I know about diet, weight, and exercise? Eat a healthy diet   Eat a diet that includes plenty of vegetables, fruits, low-fat dairy products, and lean protein.  Do not eat a lot of foods that are high in solid fats, added sugars, or sodium. Maintain a healthy weight Body mass index (BMI) is used to identify weight problems. It estimates body fat based on height and weight. Your health care provider can help determine your BMI and help you achieve or maintain a healthy weight. Get regular exercise Get regular exercise. This is one of the most important things you can do for your health. Most adults should:  Exercise for at least 150 minutes each week. The exercise should increase your heart rate and make you sweat (moderate-intensity exercise).  Do strengthening exercises at least twice a week. This is in addition to the moderate-intensity exercise.  Spend less time sitting. Even light physical activity can be beneficial. Watch cholesterol and blood lipids Have your blood tested for lipids and cholesterol at 48 years of age, then have this test every 5 years. Have your cholesterol levels checked more often if:  Your lipid or cholesterol levels are high.  You are older than 49 years of age.  You are at high risk for  heart disease. What should I know about cancer screening? Depending on your health history and family history, you may need to have cancer screening at various ages. This may include screening for:  Breast cancer.  Cervical cancer.  Colorectal cancer.  Skin cancer.  Lung cancer. What should I know about heart disease, diabetes, and high blood pressure? Blood pressure and heart disease  High blood pressure causes heart disease and increases the risk of stroke. This is more likely to develop in people who have high blood pressure readings, are of African descent, or are overweight.  Have your blood pressure checked: ? Every 3-5 years if you are 59-23 years of age. ? Every year if you are 84 years old or older. Diabetes Have regular diabetes screenings. This checks your fasting blood sugar level. Have the screening done:  Once every three years after age 51 if you are at a normal weight and have a low risk for diabetes.  More often and at a younger age if you are overweight or have a high risk for diabetes. What should I know about preventing infection? Hepatitis B If you have a higher risk for hepatitis B, you should be screened for this virus. Talk with your health care provider to find out if you are at risk for hepatitis B infection. Hepatitis C Testing is recommended for:  Everyone born from [birth year] through 1965.  Anyone with known risk factors for hepatitis C. Sexually transmitted infections (STIs)  Get screened for STIs, including gonorrhea and chlamydia, if: ? You are sexually active and are younger than 49 years of age. ?  You are older than 49 years of age and your health care provider tells you that you are at risk for this type of infection. ? Your sexual activity has changed since you were last screened, and you are at increased risk for chlamydia or gonorrhea. Ask your health care provider if you are at risk.  Ask your health care provider about whether you are at  high risk for HIV. Your health care provider may recommend a prescription medicine to help prevent HIV infection. If you choose to take medicine to prevent HIV, you should first get tested for HIV. You should then be tested every 3 months for as long as you are taking the medicine. Pregnancy  If you are about to stop having your period (premenopausal) and you may become pregnant, seek counseling before you get pregnant.  Take 400 to 800 micrograms (mcg) of folic acid every day if you become pregnant.  Ask for birth control (contraception) if you want to prevent pregnancy. Osteoporosis and menopause Osteoporosis is a disease in which the bones lose minerals and strength with aging. This can result in bone fractures. If you are 14 years old or older, or if you are at risk for osteoporosis and fractures, ask your health care provider if you should:  Be screened for bone loss.  Take a calcium or vitamin D supplement to lower your risk of fractures.  Be given hormone replacement therapy (HRT) to treat symptoms of menopause. Follow these instructions at home: Lifestyle  Do not use any products that contain nicotine or tobacco, such as cigarettes, e-cigarettes, and chewing tobacco. If you need help quitting, ask your health care provider.  Do not use street drugs.  Do not share needles.  Ask your health care provider for help if you need support or information about quitting drugs. Alcohol use  Do not drink alcohol if: ? Your health care provider tells you not to drink. ? You are pregnant, may be pregnant, or are planning to become pregnant.  If you drink alcohol: ? Limit how much you use to 0-1 drink a day. ? Limit intake if you are breastfeeding.  Be aware of how much alcohol is in your drink. In the [place] one drink equals one 12 oz bottle of beer (355 mL), one 5 oz glass of wine (148 mL), or one 1 oz glass of hard liquor (44 mL). General instructions  Schedule regular health,  dental, and eye exams.  Stay current with your vaccines.  Tell your health care provider if: ? You often feel depressed. ? You have ever been abused or do not feel safe at home. Summary  Adopting a healthy lifestyle and getting preventive care are important in promoting health and wellness.  Follow your health care provider's instructions about healthy diet, exercising, and getting tested or screened for diseases.  Follow your health care provider's instructions on monitoring your cholesterol and blood pressure. This information is not intended to replace advice given to you by your health care provider. Make sure you discuss any questions you have with your health care provider. Document Revised: 10/06/2018 Document Reviewed: 10/06/2018 Elsevier Patient Education  2020 [employer] for [employer] Calories are units of energy. Your body needs a certain amount of calories from food to keep you going throughout the day. When you eat more calories than your body needs, your body stores the extra calories as fat. When you eat fewer calories than your body needs, your body  burns fat to get the energy it needs. Calorie counting means keeping track of how many calories you eat and drink each day. Calorie counting can be helpful if you need to lose weight. If you make sure to eat fewer calories than your body needs, you should lose weight. Ask your health care provider what a healthy weight is for you. For calorie counting to work, you will need to eat the right number of calories in a day in order to lose a healthy amount of weight per week. A dietitian can help you determine how many calories you need in a day and will give you suggestions on how to reach your calorie goal.  A healthy amount of weight to lose per week is usually 1-2 lb (0.5-0.9 kg). This usually means that your daily calorie intake should be reduced by 500-750 calories.  Eating 1,200 - 1,500 calories per day  can help most women lose weight.  Eating 1,500 - 1,800 calories per day can help most men lose weight. What is my plan? My goal is to have __________ calories per day. If I have this many calories per day, I should lose around __________ pounds per week. What do I need to know about calorie counting? In order to meet your daily calorie goal, you will need to:  Find out how many calories are in each food you would like to eat. Try to do this before you eat.  Decide how much of the food you plan to eat.  Write down what you ate and how many calories it had. Doing this is called keeping a food log. To successfully lose weight, it is important to balance calorie counting with a healthy lifestyle that includes regular activity. Aim for 150 minutes of moderate exercise (such as walking) or 75 minutes of vigorous exercise (such as running) each week. Where do I find calorie information?  The number of calories in a food can be found on a Nutrition Facts label. If a food does not have a Nutrition Facts label, try to look up the calories online or ask your dietitian for help. Remember that calories are listed per serving. If you choose to have more than one serving of a food, you will have to multiply the calories per serving by the amount of servings you plan to eat. For example, the label on a package of bread might say that a serving size is 1 slice and that there are 90 calories in a serving. If you eat 1 slice, you will have eaten 90 calories. If you eat 2 slices, you will have eaten 180 calories. How do I keep a food log? Immediately after each meal, record the following information in your food log:  What you ate. Don't forget to include toppings, sauces, and other extras on the food.  How much you ate. This can be measured in cups, ounces, or number of items.  How many calories each food and drink had.  The total number of calories in the meal. Keep your food log near you, such as in a  small notebook in your pocket, or use a mobile app or website. Some programs will calculate calories for you and show you how many calories you have left for the day to meet your goal. What are some calorie counting tips?   Use your calories on foods and drinks that will fill you up and not leave you hungry: ? Some examples of foods that fill you up are  nuts and nut butters, vegetables, lean proteins, and high-fiber foods like whole grains. High-fiber foods are foods with more than 5 g fiber per serving. ? Drinks such as sodas, specialty coffee drinks, alcohol, and juices have a lot of calories, yet do not fill you up.  Eat nutritious foods and avoid empty calories. Empty calories are calories you get from foods or beverages that do not have many vitamins or protein, such as candy, sweets, and soda. It is better to have a nutritious high-calorie food (such as an avocado) than a food with few nutrients (such as a bag of chips).  Know how many calories are in the foods you eat most often. This will help you calculate calorie counts faster.  Pay attention to calories in drinks. Low-calorie drinks include water and unsweetened drinks.  Pay attention to nutrition labels for "low fat" or "fat free" foods. These foods sometimes have the same amount of calories or more calories than the full fat versions. They also often have added sugar, starch, or salt, to make up for flavor that was removed with the fat.  Find a way of tracking calories that works for you. Get creative. Try different apps or programs if writing down calories does not work for you. What are some portion control tips?  Know how many calories are in a serving. This will help you know how many servings of a certain food you can have.  Use a measuring cup to measure serving sizes. You could also try weighing out portions on a kitchen scale. With time, you will be able to estimate serving sizes for some foods.  Take some time to put  servings of different foods on your favorite plates, bowls, and cups so you know what a serving looks like.  Try not to eat straight from a bag or box. Doing this can lead to overeating. Put the amount you would like to eat in a cup or on a plate to make sure you are eating the right portion.  Use smaller plates, glasses, and bowls to prevent overeating.  Try not to multitask (for example, watch TV or use your computer) while eating. If it is time to eat, sit down at a table and enjoy your food. This will help you to know when you are full. It will also help you to be aware of what you are eating and how much you are eating. What are tips for following this plan? Reading food labels  Check the calorie count compared to the serving size. The serving size may be smaller than what you are used to eating.  Check the source of the calories. Make sure the food you are eating is high in vitamins and protein and low in saturated and trans fats. Shopping  Read nutrition labels while you shop. This will help you make healthy decisions before you decide to purchase your food.  Make a grocery list and stick to it. Cooking  Try to cook your favorite foods in a healthier way. For example, try baking instead of frying.  Use low-fat dairy products. Meal planning  Use more fruits and vegetables. Half of your plate should be fruits and vegetables.  Include lean proteins like poultry and fish. How do I count calories when eating out?  Ask for smaller portion sizes.  Consider sharing an entree and sides instead of getting your own entree.  If you get your own entree, eat only half. Ask for a box at the beginning of  your meal and put the rest of your entree in it so you are not tempted to eat it.  If calories are listed on the menu, choose the lower calorie options.  Choose dishes that include vegetables, fruits, whole grains, low-fat dairy products, and lean protein.  Choose items that are  boiled, broiled, grilled, or steamed. Stay away from items that are buttered, battered, fried, or served with cream sauce. Items labeled "crispy" are usually fried, unless stated otherwise.  Choose water, low-fat milk, unsweetened iced tea, or other drinks without added sugar. If you want an alcoholic beverage, choose a lower calorie option such as a glass of wine or light beer.  Ask for dressings, sauces, and syrups on the side. These are usually high in calories, so you should limit the amount you eat.  If you want a salad, choose a garden salad and ask for grilled meats. Avoid extra toppings like bacon, cheese, or fried items. Ask for the dressing on the side, or ask for olive oil and vinegar or lemon to use as dressing.  Estimate how many servings of a food you are given. For example, a serving of cooked rice is  cup or about the size of half a baseball. Knowing serving sizes will help you be aware of how much food you are eating at restaurants. The list below tells you how big or small some common portion sizes are based on everyday objects: ? 1 oz--4 stacked dice. ? 3 oz--1 deck of cards. ? 1 tsp--1 die. ? 1 Tbsp-- a ping-pong ball. ? 2 Tbsp--1 ping-pong ball. ?  cup-- baseball. ? 1 cup--1 baseball. Summary  Calorie counting means keeping track of how many calories you eat and drink each day. If you eat fewer calories than your body needs, you should lose weight.  A healthy amount of weight to lose per week is usually 1-2 lb (0.5-0.9 kg). This usually means reducing your daily calorie intake by 500-750 calories.  The number of calories in a food can be found on a Nutrition Facts label. If a food does not have a Nutrition Facts label, try to look up the calories online or ask your dietitian for help.  Use your calories on foods and drinks that will fill you up, and not on foods and drinks that will leave you hungry.  Use smaller plates, glasses, and bowls to prevent  overeating. This information is not intended to replace advice given to you by your health care provider. Make sure you discuss any questions you have with your health care provider. Document Revised: 07/02/2018 Document Reviewed: 09/12/2016 Elsevier Patient Education  [place] and Cholesterol Restricted Eating Plan Getting too much fat and cholesterol in your diet may cause health problems. Choosing the right foods helps keep your fat and cholesterol at normal levels. This can keep you from getting certain diseases. Your doctor may recommend an eating plan that includes:  Total fat: ______% or less of total calories a day.  Saturated fat: ______% or less of total calories a day.  Cholesterol: less than _________mg a day.  Fiber: ______g a day. What are tips for following this plan? Meal planning  At meals, divide your plate into four equal parts: ? Fill one-half of your plate with vegetables and green salads. ? Fill one-fourth of your plate with whole grains. ? Fill one-fourth of your plate with low-fat (lean) protein foods.  Eat fish that is high in omega-3 fats at least  two times a week. This includes mackerel, tuna, sardines, and salmon.  Eat foods that are high in fiber, such as whole grains, beans, apples, broccoli, carrots, peas, and barley. General tips   Work with your doctor to lose weight if you need to.  Avoid: ? Foods with added sugar. ? Fried foods. ? Foods with partially hydrogenated oils.  Limit alcohol intake to no more than 1 drink a day for nonpregnant women and 2 drinks a day for men. One drink equals 12 oz of beer, 5 oz of wine, or 1 oz of hard liquor. Reading food labels  Check food labels for: ? Trans fats. ? Partially hydrogenated oils. ? Saturated fat (g) in each serving. ? Cholesterol (mg) in each serving. ? Fiber (g) in each serving.  Choose foods with healthy fats, such as: ? Monounsaturated fats. ? Polyunsaturated  fats. ? Omega-3 fats.  Choose grain products that have whole grains. Look for the word "whole" as the first word in the ingredient list. Cooking  Cook foods using low-fat methods. These include baking, boiling, grilling, and broiling.  Eat more home-cooked foods. Eat at restaurants and buffets less often.  Avoid cooking using saturated fats, such as butter, cream, palm oil, palm kernel oil, and coconut oil. Recommended foods  Fruits  All fresh, canned (in natural juice), or frozen fruits. Vegetables  Fresh or frozen vegetables (raw, steamed, roasted, or grilled). Green salads. Grains  Whole grains, such as whole wheat or whole grain breads, crackers, cereals, and pasta. Unsweetened oatmeal, bulgur, barley, quinoa, or brown rice. Corn or whole wheat flour tortillas. Meats and other protein foods  Ground beef (85% or leaner), grass-fed beef, or beef trimmed of fat. Skinless chicken or [place]. Ground chicken or [place]. Pork trimmed of fat. All fish and seafood. Egg whites. Dried beans, peas, or lentils. Unsalted nuts or seeds. Unsalted canned beans. Nut butters without added sugar or oil. Dairy  Low-fat or nonfat dairy products, such as skim or 1% milk, 2% or reduced-fat cheeses, low-fat and fat-free ricotta or cottage cheese, or plain low-fat and nonfat yogurt. Fats and oils  Tub margarine without trans fats. Light or reduced-fat mayonnaise and salad dressings. Avocado. Olive, canola, sesame, or safflower oils. The items listed above may not be a complete list of foods and beverages you can eat. Contact a dietitian for more information. Foods to avoid Fruits  Canned fruit in heavy syrup. Fruit in cream or butter sauce. Fried fruit. Vegetables  Vegetables cooked in cheese, cream, or butter sauce. Fried vegetables. Grains  White bread. White pasta. White rice. Cornbread. Bagels, pastries, and croissants. Crackers and snack foods that contain trans fat and hydrogenated  oils. Meats and other protein foods  Fatty cuts of meat. Ribs, chicken wings, bacon, sausage, bologna, salami, chitterlings, fatback, hot dogs, bratwurst, and packaged lunch meats. Liver and organ meats. Whole eggs and egg yolks. Chicken and [place] with skin. Fried meat. Dairy  Whole or 2% milk, cream, half-and-half, and cream cheese. Whole milk cheeses. Whole-fat or sweetened yogurt. Full-fat cheeses. Nondairy creamers and whipped toppings. Processed cheese, cheese spreads, and cheese curds. Beverages  Alcohol. Sugar-sweetened drinks such as sodas, lemonade, and fruit drinks. Fats and oils  Butter, stick margarine, lard, shortening, ghee, or bacon fat. Coconut, palm kernel, and palm oils. Sweets and desserts  Corn syrup, sugars, honey, and molasses. Candy. Jam and jelly. Syrup. Sweetened cereals. Cookies, pies, cakes, donuts, muffins, and ice cream. The items listed above may not be a complete list  of foods and beverages you should avoid. Contact a dietitian for more information. Summary  Choosing the right foods helps keep your fat and cholesterol at normal levels. This can keep you from getting certain diseases.  At meals, fill one-half of your plate with vegetables and green salads.  Eat high-fiber foods, like whole grains, beans, apples, carrots, peas, and barley.  Limit added sugar, saturated fats, alcohol, and fried foods. This information is not intended to replace advice given to you by your health care provider. Make sure you discuss any questions you have with your health care provider. Document Revised: 06/16/2018 Document Reviewed: 06/30/2017 Elsevier Patient Education  [place].

## 2020-08-02 ENCOUNTER — Telehealth: Payer: Self-pay

## 2020-08-02 DIAGNOSIS — M545 Low back pain, unspecified: Secondary | ICD-10-CM | POA: Diagnosis not present

## 2020-08-02 NOTE — Telephone Encounter (Signed)
Please review and authorize refills. KW

## 2020-08-02 NOTE — Telephone Encounter (Signed)
Waiting on pending lab results- new patient.

## 2020-08-02 NOTE — Telephone Encounter (Signed)
Kathleen Carr stopped by the office this morning to please let [name] know that she needs all of her medications sent into [facility].    She only had enough for a month and she is almost out.

## 2020-08-03 LAB — CBC WITH DIFFERENTIAL/PLATELET
Basophils Absolute: 0.1 10*3/uL (ref 0.0–0.2)
Basos: 1 %
EOS (ABSOLUTE): 0.2 10*3/uL (ref 0.0–0.4)
Eos: 2 %
Hematocrit: 44 % (ref 34.0–46.6)
Hemoglobin: 15.2 g/dL (ref 11.1–15.9)
Immature Grans (Abs): 0.1 10*3/uL (ref 0.0–0.1)
Immature Granulocytes: 1 %
Lymphocytes Absolute: 5.3 10*3/uL — ABNORMAL HIGH (ref 0.7–3.1)
Lymphs: 51 %
MCH: 32.5 pg (ref 26.6–33.0)
MCHC: 34.5 g/dL (ref 31.5–35.7)
MCV: 94 fL (ref 79–97)
Monocytes Absolute: 0.6 10*3/uL (ref 0.1–0.9)
Monocytes: 6 %
Neutrophils Absolute: 4 10*3/uL (ref 1.4–7.0)
Neutrophils: 39 %
Platelets: 409 10*3/uL (ref 150–450)
RBC: 4.68 x10E6/uL (ref 3.77–5.28)
RDW: 12.1 % (ref 11.7–15.4)
WBC: 10.1 10*3/uL (ref 3.4–10.8)

## 2020-08-03 LAB — COMPREHENSIVE METABOLIC PANEL
ALT: 17 IU/L (ref 0–32)
AST: 18 IU/L (ref 0–40)
Albumin/Globulin Ratio: 2.3 — ABNORMAL HIGH (ref 1.2–2.2)
Albumin: 5 g/dL — ABNORMAL HIGH (ref 3.8–4.8)
Alkaline Phosphatase: 74 IU/L (ref 44–121)
BUN/Creatinine Ratio: 15 (ref 9–23)
BUN: 13 mg/dL (ref 6–24)
Bilirubin Total: 0.2 mg/dL (ref 0.0–1.2)
CO2: 22 mmol/L (ref 20–29)
Calcium: 9.8 mg/dL (ref 8.7–10.2)
Chloride: 100 mmol/L (ref 96–106)
Creatinine, Ser: 0.88 mg/dL (ref 0.57–1.00)
GFR calc Af Amer: 90 mL/min/{1.73_m2} (ref >59)
GFR calc non Af Amer: 78 mL/min/{1.73_m2} (ref >59)
Globulin, Total: 2.2 g/dL (ref 1.5–4.5)
Glucose: 92 mg/dL (ref 65–99)
Potassium: 4.7 mmol/L (ref 3.5–5.2)
Sodium: 137 mmol/L (ref 134–144)
Total Protein: 7.2 g/dL (ref 6.0–8.5)

## 2020-08-03 LAB — HEMOGLOBIN A1C
Est. average glucose Bld gHb Est-mCnc: 123 mg/dL
Hgb A1c MFr Bld: 5.9 % — ABNORMAL HIGH (ref 4.8–5.6)

## 2020-08-03 LAB — LIPID PANEL W/O CHOL/HDL RATIO
Cholesterol, Total: 193 mg/dL (ref 100–199)
HDL: 61 mg/dL (ref >39)
LDL Chol Calc (NIH): 106 mg/dL — ABNORMAL HIGH (ref 0–99)
Triglycerides: 151 mg/dL — ABNORMAL HIGH (ref 0–149)
VLDL Cholesterol Cal: 26 mg/dL (ref 5–40)

## 2020-08-03 LAB — TSH: TSH: 2.42 u[IU]/mL (ref 0.450–4.500)

## 2020-08-05 ENCOUNTER — Encounter: Payer: Self-pay | Admitting: Adult Health

## 2020-08-05 DIAGNOSIS — Z6830 Body mass index (BMI) 30.0-30.9, adult: Secondary | ICD-10-CM | POA: Insufficient documentation

## 2020-08-05 DIAGNOSIS — S3992XA Unspecified injury of lower back, initial encounter: Secondary | ICD-10-CM | POA: Insufficient documentation

## 2020-08-05 DIAGNOSIS — E663 Overweight: Secondary | ICD-10-CM | POA: Insufficient documentation

## 2020-08-05 DIAGNOSIS — G8929 Other chronic pain: Secondary | ICD-10-CM | POA: Insufficient documentation

## 2020-08-05 HISTORY — DX: Morbid (severe) obesity due to excess calories: E66.01

## 2020-08-08 ENCOUNTER — Other Ambulatory Visit: Payer: Self-pay | Admitting: Adult Health

## 2020-08-08 MED ORDER — ROSUVASTATIN CALCIUM 5 MG PO TABS
5.0000 mg | ORAL_TABLET | Freq: Every day | ORAL | 1 refills | Status: DC
Start: 2020-08-08 — End: 2020-08-08

## 2020-08-08 MED ORDER — METFORMIN HCL 1000 MG PO TABS
1000.0000 mg | ORAL_TABLET | Freq: Every day | ORAL | 1 refills | Status: DC
Start: 2020-08-08 — End: 2021-01-22

## 2020-08-08 MED ORDER — SERTRALINE HCL 100 MG PO TABS
100.0000 mg | ORAL_TABLET | Freq: Every day | ORAL | 0 refills | Status: DC
Start: 2020-08-08 — End: 2020-11-05

## 2020-08-08 MED ORDER — LEVOTHYROXINE SODIUM 50 MCG PO TABS: 50.0000 ug | ORAL_TABLET | Freq: Every day | ORAL | 1 refills | Status: DC

## 2020-08-08 MED ORDER — QUETIAPINE FUMARATE 300 MG PO TABS
300.0000 mg | ORAL_TABLET | Freq: Every day | ORAL | 0 refills | Status: DC
Start: 2020-08-08 — End: 2020-11-13

## 2020-08-08 MED ORDER — PANTOPRAZOLE SODIUM 40 MG PO TBEC: 40.0000 mg | DELAYED_RELEASE_TABLET | Freq: Every day | ORAL | 1 refills | Status: DC

## 2020-08-08 MED ORDER — SITAGLIPTIN PHOSPHATE 100 MG PO TABS: 100.0000 mg | ORAL_TABLET | Freq: Every day | ORAL | 1 refills | Status: DC

## 2020-08-08 NOTE — Progress Notes (Signed)
TSH within normal limits continue synthroid 8mcg daily.  Cholesterol shows high triglycerides, monitor glucose closely, avoid excessive breads, starches in diet as well as processed foods. Adding on daily mercury free fish oil may help lower as well.  LDL elevated will continue Crestor 5mg  for now, will recheck cholesterol in 6 months advised.   Discuss lifestyle modification with patient e.g. increase exercise, fiber, fruits, vegetables, lean meat, and omega 3/fish intake and decrease saturated fat.   CBC and CMP ok.  Hemoglobin A1C controlled continue current medications and keep log of blood sugars, report any persistent or symptomatic low/ high readings.   Recheck cbc, cmp, TSH, A1C and lipid panel in 6 months please add future labs to patients orders.   Need to schedule follow up appointment in 3 months for anxiety/ depression and refill on psych medications advised.   Refilled following medications, will not refill Ambien since on Seroquel at night as discussed. Can follow with psychiatry if wants.    Follow up as recommended and if needed.

## 2020-08-09 DIAGNOSIS — M5459 Other low back pain: Secondary | ICD-10-CM | POA: Diagnosis not present

## 2020-08-09 DIAGNOSIS — M5416 Radiculopathy, lumbar region: Secondary | ICD-10-CM | POA: Diagnosis not present

## 2020-08-15 ENCOUNTER — Other Ambulatory Visit: Payer: Self-pay | Admitting: Physician Assistant

## 2020-08-15 DIAGNOSIS — M545 Low back pain, unspecified: Secondary | ICD-10-CM

## 2020-08-23 ENCOUNTER — Other Ambulatory Visit: Payer: Self-pay | Admitting: Physician Assistant

## 2020-08-28 ENCOUNTER — Other Ambulatory Visit: Payer: Self-pay

## 2020-08-28 ENCOUNTER — Ambulatory Visit
Admission: RE | Admit: 2020-08-28 | Discharge: 2020-08-28 | Disposition: A | Discharge status: Home / Self Care | Payer: 59 | Source: Ambulatory Visit | Attending: Physician Assistant | Admitting: Physician Assistant

## 2020-08-28 DIAGNOSIS — M545 Low back pain, unspecified: Secondary | ICD-10-CM | POA: Diagnosis not present

## 2020-08-28 IMAGING — MR MR LUMBAR SPINE W/O CM
4 of 6 series · 32 of 48 positions shown · non-contrast
Comparison: None.

CLINICAL DATA: Central left low back pain. Left hip pain and left
buttock numbness. Pain down left leg into toes.

EXAM:
MRI LUMBAR SPINE WITHOUT CONTRAST
TECHNIQUE: Multiplanar, multisequence MR imaging of the lumbar spine was
performed. No intravenous contrast was administered.

[Series 5: T2 · sagittal · 4.0mm · 0.88mm/px · 5 of 17 slices shown (1 of 2)]
[im 1/17]
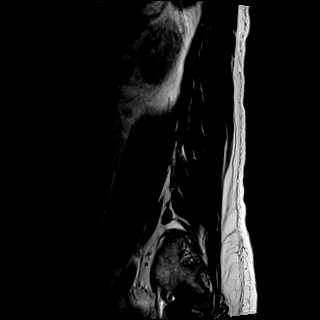
[im 5/17]
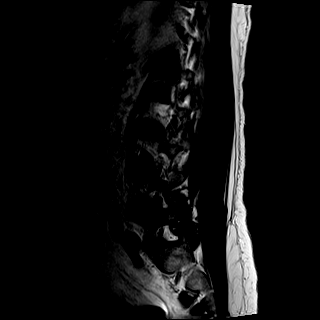
[im 9/17]
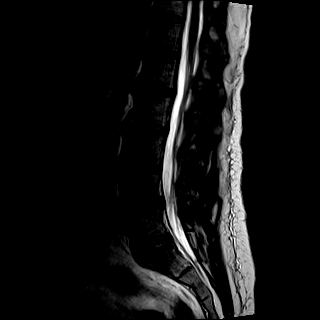
[im 13/17]
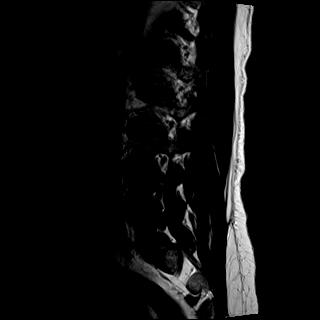
[im 17/17]
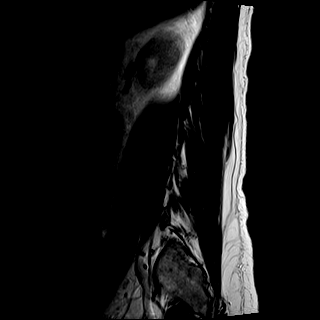

[Series 6: T1 · sagittal · 4.0mm · 0.88mm/px · 5 of 17 slices shown (1 of 2)]
[im 1/17]
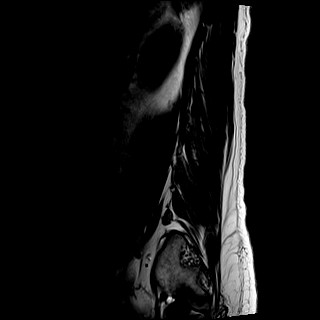
[im 5/17]
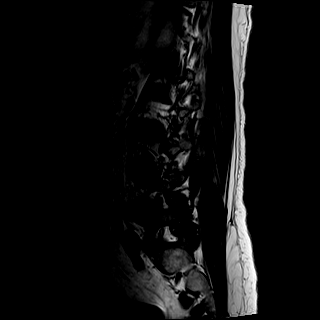
[im 9/17]
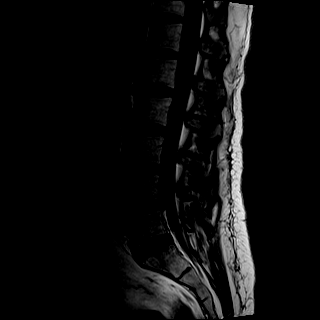
[im 13/17]
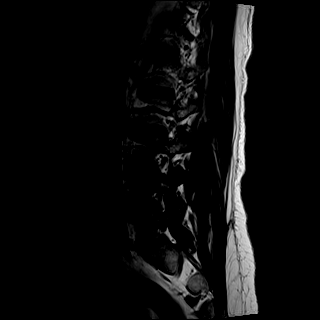
[im 17/17]
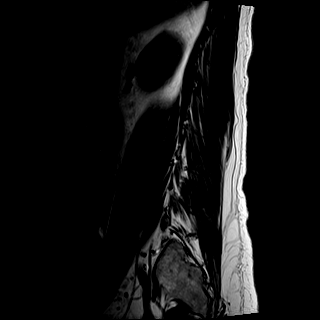

[Series 8: T2 · axial · 4.0mm · 0.78mm/px · z∈[-117,+101]mm · 11 of 36 slices shown (2 of 2)]
[im 1/36]
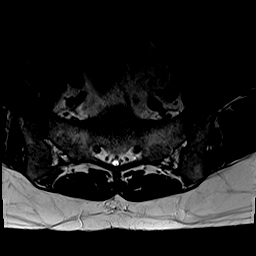
[im 4/36]
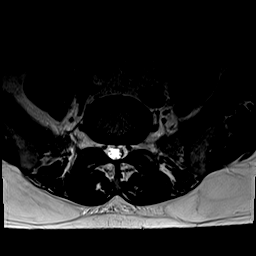
[im 8/36]
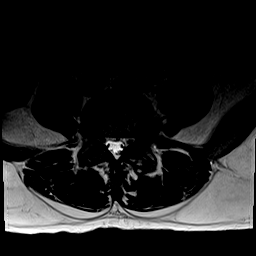
[im 11/36]
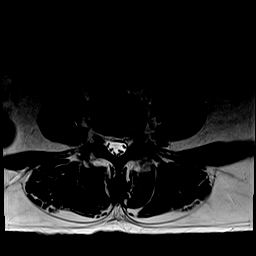
[im 15/36]
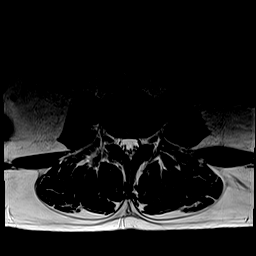
[im 18/36]
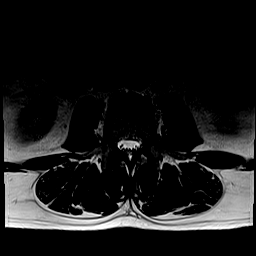
[im 22/36]
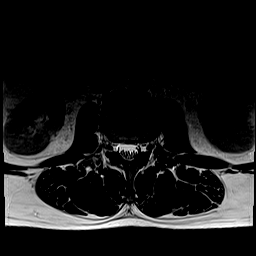
[im 25/36]
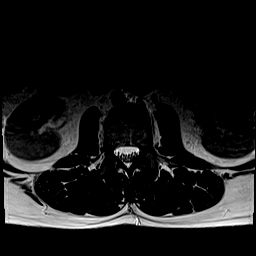
[im 29/36]
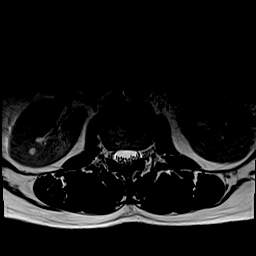
[im 32/36]
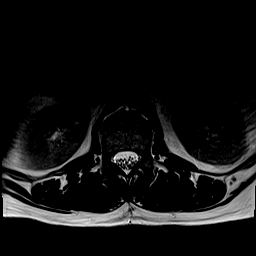
[im 36/36]
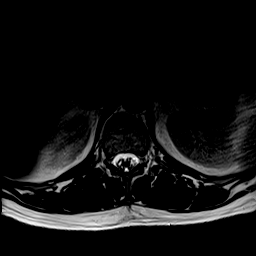

[Series 9: T1 · axial · 4.0mm · 0.39mm/px · z∈[-117,+101]mm · 11 of 36 slices shown (2 of 2)]
[im 1/36]
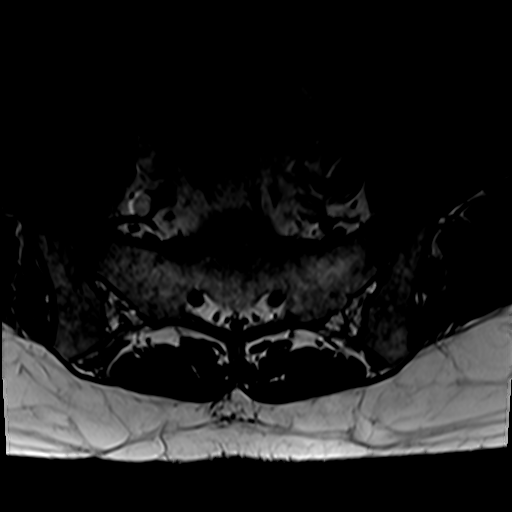
[im 4/36]
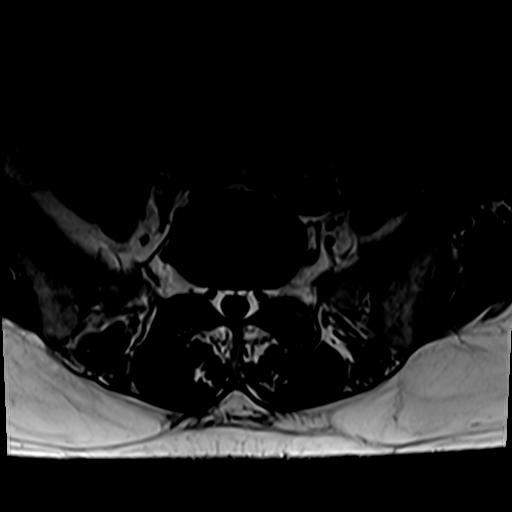
[im 8/36]
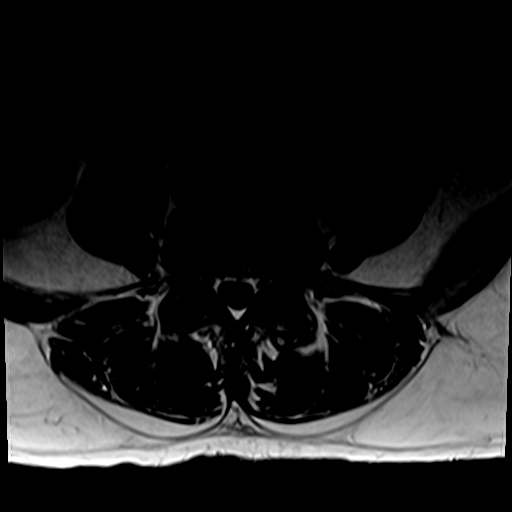
[im 11/36]
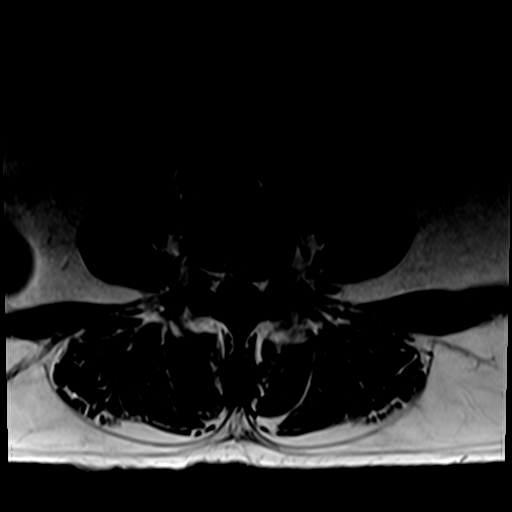
[im 15/36]
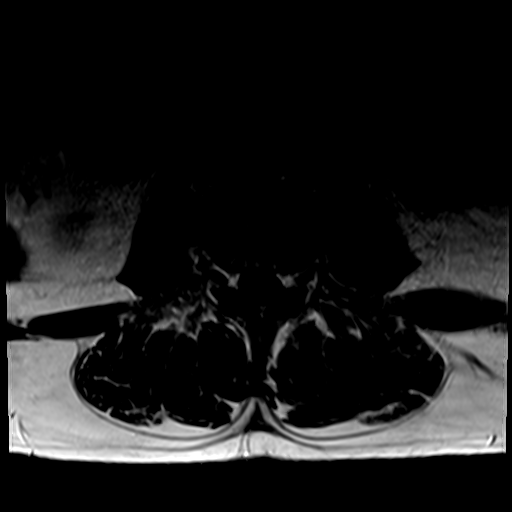
[im 18/36]
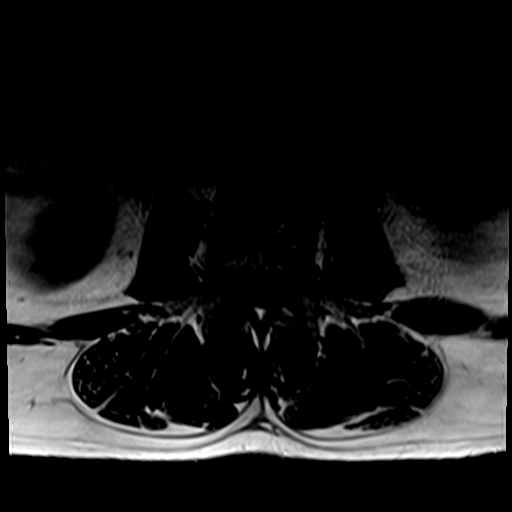
[im 22/36]
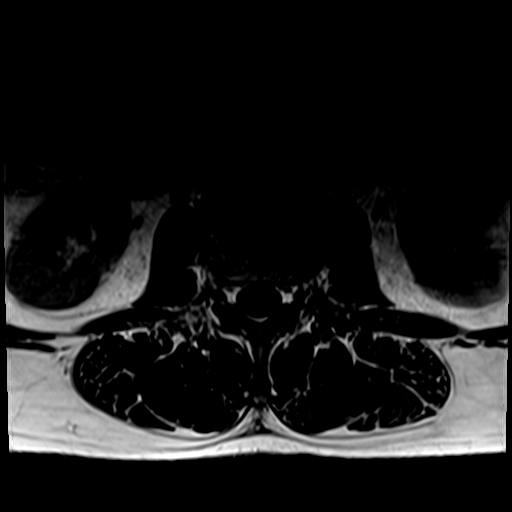
[im 25/36]
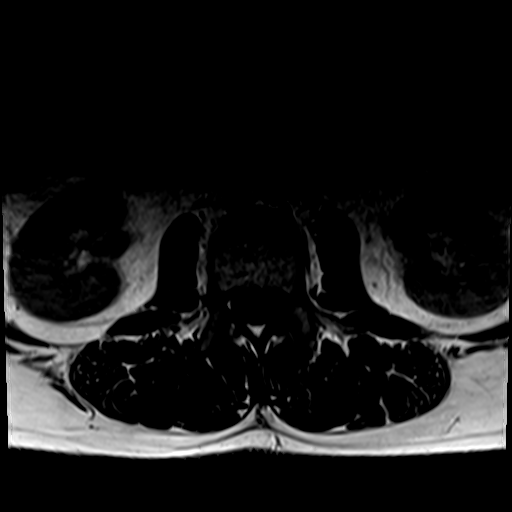
[im 29/36]
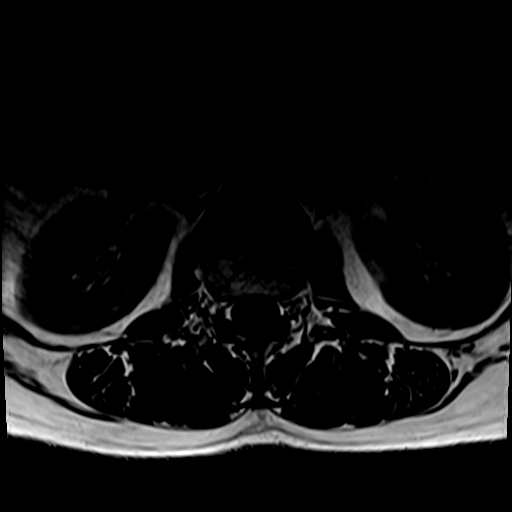
[im 32/36]
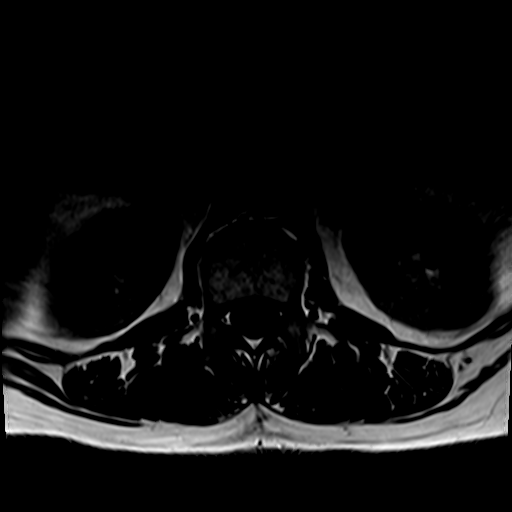
[im 36/36]
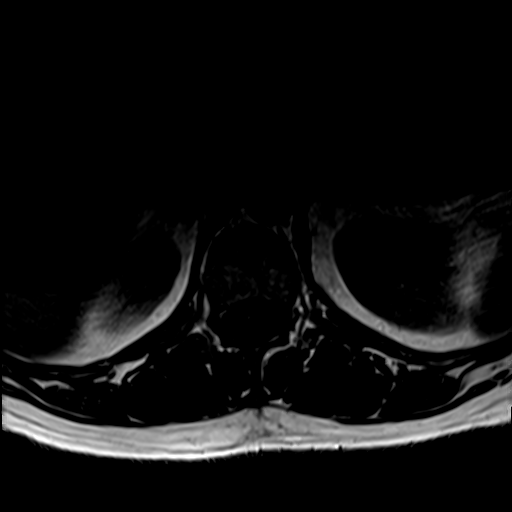

[32 of 48 positions shown; findings below may reference images not displayed]

FINDINGS: Segmentation: 5 lumbar type vertebral bodies with the inferior-most
fully formed intervertebral disc labeled L5-S1.

Alignment:  Physiologic.

Vertebrae: No evidence of acute fracture, discitis/osteomyelitis or
suspicious bone lesion. Vertebral body heights are maintained.

Conus medullaris and cauda equina: Conus extends to the superior L1
level. Conus and cauda equina appear normal.

Paraspinal and other soft tissues: Unremarkable.

Disc levels:

T12-L1: No significant disc protrusion, foraminal stenosis, or canal
stenosis.

L1-L2: No significant disc protrusion, foraminal stenosis, or canal
stenosis.

L2-L3: Minimal disc bulge and mild facet hypertrophy without
significant canal or foraminal stenosis.

L3-L4: No significant disc protrusion, foraminal stenosis, or canal
stenosis.

L4-L5: Broad-based disc bulge with superimposed left
foraminal/extraforaminal disc protrusion. Moderate bilateral facet
hypertrophy. Resulting moderate to severe left foraminal stenosis.
Moderate left subarticular recess stenosis. No significant right
foraminal stenosis or central canal stenosis.

L5-S1: Small broad-based disc bulge and moderate bilateral facet
hypertrophy without significant canal or foraminal stenosis.
IMPRESSION: At L4-L5 there is a disc bulge with superimposed left
foraminal/extraforaminal disc protrusion, which results in moderate
to severe left foraminal stenosis and moderate left subarticular
recess stenosis. No significant central canal stenosis.

## 2020-08-31 ENCOUNTER — Other Ambulatory Visit: Payer: Self-pay | Admitting: Nurse Practitioner

## 2020-08-31 DIAGNOSIS — M5116 Intervertebral disc disorders with radiculopathy, lumbar region: Secondary | ICD-10-CM | POA: Diagnosis not present

## 2020-09-13 ENCOUNTER — Other Ambulatory Visit: Payer: Self-pay | Admitting: Physician Assistant

## 2020-09-13 DIAGNOSIS — Z7984 Long term (current) use of oral hypoglycemic drugs: Secondary | ICD-10-CM | POA: Diagnosis not present

## 2020-09-13 DIAGNOSIS — E119 Type 2 diabetes mellitus without complications: Secondary | ICD-10-CM | POA: Diagnosis not present

## 2020-09-13 DIAGNOSIS — Z87891 Personal history of nicotine dependence: Secondary | ICD-10-CM | POA: Diagnosis not present

## 2020-09-13 DIAGNOSIS — Z882 Allergy status to sulfonamides status: Secondary | ICD-10-CM | POA: Diagnosis not present

## 2020-09-13 DIAGNOSIS — M47816 Spondylosis without myelopathy or radiculopathy, lumbar region: Secondary | ICD-10-CM | POA: Diagnosis not present

## 2020-09-13 DIAGNOSIS — M48061 Spinal stenosis, lumbar region without neurogenic claudication: Secondary | ICD-10-CM | POA: Diagnosis not present

## 2020-09-13 DIAGNOSIS — M5126 Other intervertebral disc displacement, lumbar region: Secondary | ICD-10-CM | POA: Diagnosis not present

## 2020-09-13 DIAGNOSIS — I10 Essential (primary) hypertension: Secondary | ICD-10-CM | POA: Diagnosis not present

## 2020-09-13 DIAGNOSIS — J45909 Unspecified asthma, uncomplicated: Secondary | ICD-10-CM | POA: Diagnosis not present

## 2020-09-17 ENCOUNTER — Other Ambulatory Visit: Payer: Self-pay | Admitting: Physician Assistant

## 2020-09-24 ENCOUNTER — Other Ambulatory Visit: Payer: Self-pay | Admitting: Orthopedic Surgery

## 2020-10-03 ENCOUNTER — Other Ambulatory Visit: Payer: Self-pay | Admitting: Orthopedic Surgery

## 2020-11-05 ENCOUNTER — Other Ambulatory Visit: Payer: Self-pay | Admitting: Adult Health

## 2020-11-08 ENCOUNTER — Ambulatory Visit: Payer: Self-pay | Admitting: Adult Health

## 2020-11-13 ENCOUNTER — Encounter: Payer: Self-pay | Admitting: Adult Health

## 2020-11-13 ENCOUNTER — Other Ambulatory Visit: Payer: Self-pay | Admitting: Adult Health

## 2020-11-13 ENCOUNTER — Other Ambulatory Visit: Payer: Self-pay

## 2020-11-13 ENCOUNTER — Telehealth (INDEPENDENT_AMBULATORY_CARE_PROVIDER_SITE_OTHER): Payer: 59 | Admitting: Adult Health

## 2020-11-13 DIAGNOSIS — E119 Type 2 diabetes mellitus without complications: Secondary | ICD-10-CM | POA: Diagnosis not present

## 2020-11-13 DIAGNOSIS — G4700 Insomnia, unspecified: Secondary | ICD-10-CM | POA: Diagnosis not present

## 2020-11-13 DIAGNOSIS — E039 Hypothyroidism, unspecified: Secondary | ICD-10-CM

## 2020-11-13 DIAGNOSIS — F319 Bipolar disorder, unspecified: Secondary | ICD-10-CM | POA: Diagnosis not present

## 2020-11-13 MED ORDER — QUETIAPINE FUMARATE 300 MG PO TABS: 300.0000 mg | ORAL_TABLET | Freq: Every day | ORAL | 0 refills | Status: DC

## 2020-11-13 MED ORDER — ZOLPIDEM TARTRATE 5 MG PO TABS: 5.0000 mg | ORAL_TABLET | Freq: Every evening | ORAL | 0 refills | Status: DC | PRN

## 2020-11-13 NOTE — Patient Instructions (Signed)
Hypothyroidism  Hypothyroidism is when the thyroid gland does not make enough of certain hormones (it is underactive). The thyroid gland is a small gland located in the lower front part of the neck, just in front of the windpipe (trachea). This gland makes hormones that help control how the body uses food for energy (metabolism) as well as how the heart and brain function. These hormones also play a role in keeping your bones strong. When the thyroid is underactive, it produces too little of the hormones thyroxine (T4) and triiodothyronine (T3). What are the causes? This condition may be caused by:  Hashimoto's disease. This is a disease in which the body's disease-fighting system (immune system) attacks the thyroid gland. This is the most common cause.  Viral infections.  Pregnancy.  Certain medicines.  Birth defects.  Past radiation treatments to the head or neck for cancer.  Past treatment with radioactive iodine.  Past exposure to radiation in the environment.  Past surgical removal of part or all of the thyroid.  Problems with a gland in the center of the brain (pituitary gland).  Lack of enough iodine in the diet. What increases the risk? You are more likely to develop this condition if:  You are female.  You have a family history of thyroid conditions.  You use a medicine called lithium.  You take medicines that affect the immune system (immunosuppressants). What are the signs or symptoms? Symptoms of this condition include:  Feeling as though you have no energy (lethargy).  Not being able to tolerate cold.  Weight gain that is not explained by a change in diet or exercise habits.  Lack of appetite.  Dry skin.  Coarse hair.  Menstrual irregularity.  Slowing of thought processes.  Constipation.  Sadness or depression. How is this diagnosed? This condition may be diagnosed based on:  Your symptoms, your medical history, and a physical exam.  Blood  tests. You may also have imaging tests, such as an ultrasound or MRI. How is this treated? This condition is treated with medicine that replaces the thyroid hormones that your body does not make. After you begin treatment, it may take several weeks for symptoms to go away. Follow these instructions at home:  Take over-the-counter and prescription medicines only as told by your health care provider.  If you start taking any new medicines, tell your health care provider.  Keep all follow-up visits as told by your health care provider. This is important. ? As your condition improves, your dosage of thyroid hormone medicine may change. ? You will need to have blood tests regularly so that your health care provider can monitor your condition. Contact a health care provider if:  Your symptoms do not get better with treatment.  You are taking thyroid hormone replacement medicine and you: ? Sweat a lot. ? Have tremors. ? Feel anxious. ? Lose weight rapidly. ? Cannot tolerate heat. ? Have emotional swings. ? Have diarrhea. ? Feel weak. Get help right away if you have:  Chest pain.  An irregular heartbeat.  A rapid heartbeat.  Difficulty breathing. Summary  Hypothyroidism is when the thyroid gland does not make enough of certain hormones (it is underactive).  When the thyroid is underactive, it produces too little of the hormones thyroxine (T4) and triiodothyronine (T3).  The most common cause is Hashimoto's disease, a disease in which the body's disease-fighting system (immune system) attacks the thyroid gland. The condition can also be caused by viral infections, medicine, pregnancy, or  past radiation treatment to the head or neck.  Symptoms may include weight gain, dry skin, constipation, feeling as though you do not have energy, and not being able to tolerate cold.  This condition is treated with medicine to replace the thyroid hormones that your body does not make. This  information is not intended to replace advice given to you by your health care provider. Make sure you discuss any questions you have with your health care provider. Document Revised: 07/13/2020 Document Reviewed: 06/28/2020 Elsevier Patient Education  2021 [employer] Inc. Levothyroxine tablets What is this medicine? LEVOTHYROXINE (lee voe thye ROX een) is a thyroid hormone. This medicine can improve symptoms of thyroid deficiency such as slow speech, lack of energy, weight gain, hair loss, dry skin, and feeling cold. It also helps to treat goiter (an enlarged thyroid gland). It is also used to treat some kinds of thyroid cancer along with surgery and other medicines. This medicine may be used for other purposes; ask your health care provider or pharmacist if you have questions. COMMON BRAND NAME(S): Estre, Euthyrox, Levo-T, Levothroid, Levoxyl, Synthroid, Thyro-Tabs, Unithroid What should I tell my health care provider before I take this medicine? They need to know if you have any of these conditions:  Addison's disease or other adrenal gland problem  angina  bone problems  diabetes  dieting or on a weight loss program  fertility problems  heart disease  pituitary gland problem  take medicines that treat or prevent blood clots  an unusual or allergic reaction to levothyroxine, thyroid hormones, other medicines, foods, dyes, or preservatives  pregnant or trying to get pregnant  breast-feeding How should I use this medicine? Take this medicine by mouth with plenty of water. It is best to take on an empty stomach, at least 30 minutes to one hour before breakfast. Avoid taking antacids containing aluminum or magnesium, simethicone, bile acid sequestrants, calcium carbonate, sodium polystyrene sulfonate, ferrous sulfate, sevelamer, lanthanum, or sucralfate within 4 hours of taking this medicine. Follow the directions on the prescription label. Take at the same time each day. Do not take  your medicine more often than directed. Contact your pediatrician regarding the use of this medicine in children. While this drug may be prescribed for children and infants as young as a few days of age for selected conditions, precautions do apply. For infants, you may crush the tablet and place in a small amount of (5 to 10 mL or 1 to 2 teaspoonfuls) of water, breast milk, or non-soy based infant formula. Do not mix with soy-based infant formula. Give as directed. Overdosage: If you think you have taken too much of this medicine contact a poison control center or emergency room at once. NOTE: This medicine is only for you. Do not share this medicine with others. What if I miss a dose? If you miss a dose, take it as soon as you can. If it is almost time for your next dose, take only that dose. Do not take double or extra doses. What may interact with this medicine?  amiodarone  antacids  anti-thyroid medicines  calcium supplements  carbamazepine  certain medicines for depression  certain medicines to treat cancer  cholestyramine  clofibrate  colesevelam  colestipol  digoxin  female hormones, like estrogens and birth control pills, patches, rings, or injections  iron supplements  ketamine  lanthanum  liquid nutrition products like Ensure  lithium  medicines for colds and breathing difficulties  medicines for diabetes  medicines or dietary  supplements for weight loss  methadone  niacin  orlistat  oxandrolone  phenobarbital or other barbiturates  phenytoin  rifampin  sevelamer  simethicone  sodium polystyrene sulfonate  soy isoflavones  steroid medicines like prednisone or cortisone  sucralfate  testosterone  theophylline  warfarin This list may not describe all possible interactions. Give your health care provider a list of all the medicines, herbs, non-prescription drugs, or dietary supplements you use. Also tell them if you smoke,  drink alcohol, or use illegal drugs. Some items may interact with your medicine. What should I watch for while using this medicine? Be sure to take this medicine with plenty of fluids. Some tablets may cause choking, gagging, or difficulty swallowing from the tablet getting stuck in your throat. Most of these problems disappear if the medicine is taken with the right amount of water or other fluids. Do not switch brands of this medicine unless your health care professional agrees with the change. Ask questions if you are uncertain. You will need regular exams and occasional blood tests to check the response to treatment. If you are receiving this medicine for an underactive thyroid, it may be several weeks before you notice an improvement. Check with your doctor or health care professional if your symptoms do not improve. It may be necessary for you to take this medicine for the rest of your life. Do not stop using this medicine unless your doctor or health care professional advises you to. This medicine can affect blood sugar levels. If you have diabetes, check your blood sugar as directed. You may lose some of your hair when you first start treatment. With time, this usually corrects itself. If you are going to have surgery, tell your doctor or health care professional that you are taking this medicine. What side effects may I notice from receiving this medicine? Side effects that you should report to your doctor or health care professional as soon as possible:  allergic reactions like skin rash, itching or hives, swelling of the face, lips, or tongue  anxious  breathing problems  changes in menstrual periods  chest pain  diarrhea  excessive sweating or intolerance to heat  fast or irregular heartbeat  leg cramps  nervousness  swelling of ankles, feet, or legs  tremors  trouble sleeping  vomiting Side effects that usually do not require medical attention (report to your  doctor or health care professional if they continue or are bothersome):  changes in appetite  headache  irritable  nausea  weight loss This list may not describe all possible side effects. Call your doctor for medical advice about side effects. You may report side effects to FDA at 1-800-FDA-1088. Where should I keep my medicine? Keep out of the reach of children. Store at room temperature between 15 and 30 degrees C (59 and 86 degrees F). Protect from light and moisture. Keep container tightly closed. Throw away any unused medicine after the expiration date. NOTE: This sheet is a summary. It may not cover all possible information. If you have questions about this medicine, talk to your doctor, pharmacist, or health care provider.  2021 Elsevier/Gold Standard (2019-10-06 13:39:26) Diabetes Mellitus and Nutrition, Adult When you have diabetes, or diabetes mellitus, it is very important to have healthy eating habits because your blood sugar (glucose) levels are greatly affected by what you eat and drink. Eating healthy foods in the right amounts, at about the same times every day, can help you:  Control your blood glucose.  Lower your risk of heart disease.  Improve your blood pressure.  Reach or maintain a healthy weight. What can affect my meal plan? Every person with diabetes is different, and each person has different needs for a meal plan. Your health care provider may recommend that you work with a dietitian to make a meal plan that is best for you. Your meal plan may vary depending on factors such as:  The calories you need.  The medicines you take.  Your weight.  Your blood glucose, blood pressure, and cholesterol levels.  Your activity level.  Other health conditions you have, such as heart or kidney disease. How do carbohydrates affect me? Carbohydrates, also called carbs, affect your blood glucose level more than any other type of food. Eating carbs naturally  raises the amount of glucose in your blood. Carb counting is a method for keeping track of how many carbs you eat. Counting carbs is important to keep your blood glucose at a healthy level, especially if you use insulin or take certain oral diabetes medicines. It is important to know how many carbs you can safely have in each meal. This is different for every person. Your dietitian can help you calculate how many carbs you should have at each meal and for each snack. How does alcohol affect me? Alcohol can cause a sudden decrease in blood glucose (hypoglycemia), especially if you use insulin or take certain oral diabetes medicines. Hypoglycemia can be a life-threatening condition. Symptoms of hypoglycemia, such as sleepiness, dizziness, and confusion, are similar to symptoms of having too much alcohol.  Do not drink alcohol if: ? Your health care provider tells you not to drink. ? You are pregnant, may be pregnant, or are planning to become pregnant.  If you drink alcohol: ? Do not drink on an empty stomach. ? Limit how much you use to:  0-1 drink a day for women.  0-2 drinks a day for men. ? Be aware of how much alcohol is in your drink. In the [place] one drink equals one 12 oz bottle of beer (355 mL), one 5 oz glass of wine (148 mL), or one 1 oz glass of hard liquor (44 mL). ? Keep yourself hydrated with water, diet soda, or unsweetened iced tea.  Keep in mind that regular soda, juice, and other mixers may contain a lot of sugar and must be counted as carbs. What are tips for following this plan? Reading food labels  Start by checking the serving size on the "Nutrition Facts" label of packaged foods and drinks. The amount of calories, carbs, fats, and other nutrients listed on the label is based on one serving of the item. Many items contain more than one serving per package.  Check the total grams (g) of carbs in one serving. You can calculate the number of servings of carbs in one  serving by dividing the total carbs by 15. For example, if a food has 30 g of total carbs per serving, it would be equal to 2 servings of carbs.  Check the number of grams (g) of saturated fats and trans fats in one serving. Choose foods that have a low amount or none of these fats.  Check the number of milligrams (mg) of salt (sodium) in one serving. Most people should limit total sodium intake to less than 2,300 mg per day.  Always check the nutrition information of foods labeled as "low-fat" or "nonfat." These foods may be higher in added sugar or refined  carbs and should be avoided.  Talk to your dietitian to identify your daily goals for nutrients listed on the label. Shopping  Avoid buying canned, pre-made, or processed foods. These foods tend to be high in fat, sodium, and added sugar.  Shop around the outside edge of the grocery store. This is where you will most often find fresh fruits and vegetables, bulk grains, fresh meats, and fresh dairy. Cooking  Use low-heat cooking methods, such as baking, instead of high-heat cooking methods like deep frying.  Cook using healthy oils, such as olive, canola, or sunflower oil.  Avoid cooking with butter, cream, or high-fat meats. Meal planning  Eat meals and snacks regularly, preferably at the same times every day. Avoid going long periods of time without eating.  Eat foods that are high in fiber, such as fresh fruits, vegetables, beans, and whole grains. Talk with your dietitian about how many servings of carbs you can eat at each meal.  Eat 4-6 oz (112-168 g) of lean protein each day, such as lean meat, chicken, fish, eggs, or tofu. One ounce (oz) of lean protein is equal to: ? 1 oz (28 g) of meat, chicken, or fish. ? 1 egg. ?  cup (62 g) of tofu.  Eat some foods each day that contain healthy fats, such as avocado, nuts, seeds, and fish.   What foods should I eat? Fruits Berries. Apples. Oranges. Peaches. Apricots. Plums. Grapes.  Mango. Papaya. Pomegranate. Kiwi. Cherries. Vegetables Lettuce. Spinach. Leafy greens, including kale, chard, collard greens, and mustard greens. Beets. Cauliflower. Cabbage. Broccoli. Carrots. Green beans. Tomatoes. Peppers. Onions. Cucumbers. Brussels sprouts. Grains Whole grains, such as whole-wheat or whole-grain bread, crackers, tortillas, cereal, and pasta. Unsweetened oatmeal. Quinoa. Brown or wild rice. Meats and other proteins Seafood. Poultry without skin. Lean cuts of poultry and beef. Tofu. Nuts. Seeds. Dairy Low-fat or fat-free dairy products such as milk, yogurt, and cheese. The items listed above may not be a complete list of foods and beverages you can eat. Contact a dietitian for more information. What foods should I avoid? Fruits Fruits canned with syrup. Vegetables Canned vegetables. Frozen vegetables with butter or cream sauce. Grains Refined white flour and flour products such as bread, pasta, snack foods, and cereals. Avoid all processed foods. Meats and other proteins Fatty cuts of meat. Poultry with skin. Breaded or fried meats. Processed meat. Avoid saturated fats. Dairy Full-fat yogurt, cheese, or milk. Beverages Sweetened drinks, such as soda or iced tea. The items listed above may not be a complete list of foods and beverages you should avoid. Contact a dietitian for more information. Questions to ask a health care provider  Do I need to meet with a diabetes educator?  Do I need to meet with a dietitian?  What number can I call if I have questions?  When are the best times to check my blood glucose? Where to find more information:  American Diabetes Association: diabetes.org  Academy of Nutrition and Dietetics: www.eatright.[employer]  National Institute of Diabetes and Digestive and Kidney Diseases: [employer].tnwww.niddk.nih.gov  Association of Diabetes Care and Education Specialists: www.diabeteseducator.org Summary  It is important to have healthy eating habits  because your blood sugar (glucose) levels are greatly affected by what you eat and drink.  A healthy meal plan will help you control your blood glucose and maintain a healthy lifestyle.  Your health care provider may recommend that you work with a dietitian to make a meal plan that is best for you.  Keep  in mind that carbohydrates (carbs) and alcohol have immediate effects on your blood glucose levels. It is important to count carbs and to use alcohol carefully. This information is not intended to replace advice given to you by your health care provider. Make sure you discuss any questions you have with your health care provider. Document Revised: 09/20/2019 Document Reviewed: 09/20/2019 Elsevier Patient Education  2021 Elsevier Inc. Insomnia Insomnia is a sleep disorder that makes it difficult to fall asleep or stay asleep. Insomnia can cause fatigue, low energy, difficulty concentrating, mood swings, and poor performance at work or school. There are three different ways to classify insomnia:  Difficulty falling asleep.  Difficulty staying asleep.  Waking up too early in the morning. Any type of insomnia can be long-term (chronic) or short-term (acute). Both are common. Short-term insomnia usually lasts for three months or less. Chronic insomnia occurs at least three times a week for longer than three months. What are the causes? Insomnia may be caused by another condition, situation, or substance, such as:  Anxiety.  Certain medicines.  Gastroesophageal reflux disease (GERD) or other gastrointestinal conditions.  Asthma or other breathing conditions.  Restless legs syndrome, sleep apnea, or other sleep disorders.  Chronic pain.  Menopause.  Stroke.  Abuse of alcohol, tobacco, or illegal drugs.  Mental health conditions, such as depression.  Caffeine.  Neurological disorders, such as Alzheimer's disease.  An overactive thyroid (hyperthyroidism). Sometimes, the cause  of insomnia may not be known. What increases the risk? Risk factors for insomnia include:  Gender. Women are affected more often than men.  Age. Insomnia is more common as you get older.  Stress.  Lack of exercise.  Irregular work schedule or working night shifts.  Traveling between different time zones.  Certain medical and mental health conditions. What are the signs or symptoms? If you have insomnia, the main symptom is having trouble falling asleep or having trouble staying asleep. This may lead to other symptoms, such as:  Feeling fatigued or having low energy.  Feeling nervous about going to sleep.  Not feeling rested in the morning.  Having trouble concentrating.  Feeling irritable, anxious, or depressed. How is this diagnosed? This condition may be diagnosed based on:  Your symptoms and medical history. Your health care provider may ask about: ? Your sleep habits. ? Any medical conditions you have. ? Your mental health.  A physical exam. How is this treated? Treatment for insomnia depends on the cause. Treatment may focus on treating an underlying condition that is causing insomnia. Treatment may also include:  Medicines to help you sleep.  Counseling or therapy.  Lifestyle adjustments to help you sleep better. Follow these instructions at home: Eating and drinking  Limit or avoid alcohol, caffeinated beverages, and cigarettes, especially close to bedtime. These can disrupt your sleep.  Do not eat a large meal or eat spicy foods right before bedtime. This can lead to digestive discomfort that can make it hard for you to sleep.   Sleep habits  Keep a sleep diary to help you and your health care provider figure out what could be causing your insomnia. Write down: ? When you sleep. ? When you wake up during the night. ? How well you sleep. ? How rested you feel the next day. ? Any side effects of medicines you are taking. ? What you eat and  drink.  Make your bedroom a dark, comfortable place where it is easy to fall asleep. ? Put up shades  or blackout curtains to block light from outside. ? Use a white noise machine to block noise. ? Keep the temperature cool.  Limit screen use before bedtime. This includes: ? Watching TV. ? Using your smartphone, tablet, or computer.  Stick to a routine that includes going to bed and waking up at the same times every day and night. This can help you fall asleep faster. Consider making a quiet activity, such as reading, part of your nighttime routine.  Try to avoid taking naps during the day so that you sleep better at night.  Get out of bed if you are still awake after 15 minutes of trying to sleep. Keep the lights down, but try reading or doing a quiet activity. When you feel sleepy, go back to bed.   General instructions  Take over-the-counter and prescription medicines only as told by your health care provider.  Exercise regularly, as told by your health care provider. Avoid exercise starting several hours before bedtime.  Use relaxation techniques to manage stress. Ask your health care provider to suggest some techniques that may work well for you. These may include: ? Breathing exercises. ? Routines to release muscle tension. ? Visualizing peaceful scenes.  Make sure that you drive carefully. Avoid driving if you feel very sleepy.  Keep all follow-up visits as told by your health care provider. This is important. Contact a health care provider if:  You are tired throughout the day.  You have trouble in your daily routine due to sleepiness.  You continue to have sleep problems, or your sleep problems get worse. Get help right away if:  You have serious thoughts about hurting yourself or someone else. If you ever feel like you may hurt yourself or others, or have thoughts about taking your own life, get help right away. You can go to your nearest emergency department or  call:  Your local emergency services (911 in the [place]  A suicide crisis helpline, such as the [place] at 216-648-8385. This is open 24 hours a day. Summary  Insomnia is a sleep disorder that makes it difficult to fall asleep or stay asleep.  Insomnia can be long-term (chronic) or short-term (acute).  Treatment for insomnia depends on the cause. Treatment may focus on treating an underlying condition that is causing insomnia.  Keep a sleep diary to help you and your health care provider figure out what could be causing your insomnia. This information is not intended to replace advice given to you by your health care provider. Make sure you discuss any questions you have with your health care provider. Document Revised: 08/23/2020 Document Reviewed: 08/23/2020 Elsevier Patient Education  2021 [place]. Zolpidem Tablets What is this medicine? ZOLPIDEM (zole PI dem) is used to treat insomnia. This medicine helps you to fall asleep and sleep through the night. This medicine may be used for other purposes; ask your health care provider or pharmacist if you have questions. COMMON BRAND NAME(S): Ambien What should I tell my health care provider before I take this medicine? They need to know if you have any of these conditions:  depression  history of drug abuse or addiction  if you often drink alcohol  liver disease  lung or breathing disease  myasthenia gravis  sleep apnea  sleep-walking, driving, eating or other activity while not fully awake after taking a sleep medicine  suicidal thoughts, plans, or attempt; a previous suicide attempt by you or a family member  an unusual  or allergic reaction to zolpidem, other medicines, foods, dyes, or preservatives  pregnant or trying to get pregnant  breast-feeding How should I use this medicine? Take this medicine by mouth with a glass of water. Follow the directions on the prescription label. It  is better to take this medicine on an empty stomach and only when you are ready for bed. Do not take your medicine more often than directed. If you have been taking this medicine for several weeks and suddenly stop taking it, you may get unpleasant withdrawal symptoms. Your doctor or health care professional may want to gradually reduce the dose. Do not stop taking this medicine on your own. Always follow your doctor or health care professional's advice. A special MedGuide will be given to you by the pharmacist with each prescription and refill. Be sure to read this information carefully each time. Talk to your pediatrician regarding the use of this medicine in children. Special care may be needed. Overdosage: If you think you have taken too much of this medicine contact a poison control center or emergency room at once. NOTE: This medicine is only for you. Do not share this medicine with others. What if I miss a dose? This does not apply. This medication should only be taken immediately before going to sleep. Do not take double or extra doses. What may interact with this medicine?  alcohol  antihistamines for allergy, cough and cold  certain medicines for anxiety or sleep  certain medicines for depression, like amitriptyline, fluoxetine, sertraline  certain medicines for fungal infections like ketoconazole and itraconazole  certain medicines for seizures like phenobarbital, primidone  ciprofloxacin  dietary supplements for sleep, like valerian or kava kava  general anesthetics like halothane, isoflurane, methoxyflurane, propofol  local anesthetics like lidocaine, pramoxine, tetracaine  medicines that relax muscles for surgery  narcotic medicines for pain  phenothiazines like chlorpromazine, mesoridazine, prochlorperazine, thioridazine  rifampin This list may not describe all possible interactions. Give your health care provider a list of all the medicines, herbs, non-prescription  drugs, or dietary supplements you use. Also tell them if you smoke, drink alcohol, or use illegal drugs. Some items may interact with your medicine. What should I watch for while using this medicine? Visit your doctor or health care professional for regular checks on your progress. Keep a regular sleep schedule by going to bed at about the same time each night. Avoid caffeine-containing drinks in the evening hours. When sleep medicines are used every night for more than a few weeks, they may stop working. Talk to your doctor if you still have trouble sleeping. After taking this medicine, you may get up out of bed and do an activity that you do not know you are doing. The next morning, you may have no memory of this. Activities include driving a car ("sleep-driving"), making and eating food, talking on the phone, sexual activity, and sleep-walking. Serious injuries have occurred. Stop the medicine and call your doctor right away if you find out you have done any of these activities. Do not take this medicine if you have used alcohol that evening. Do not take it if you have taken another medicine for sleep. The risk of doing these sleep-related activities is higher. Wait for at least 8 hours after you take a dose before driving or doing other activities that require full mental alertness. Do not take this medicine unless you are able to stay in bed for a full night (7 to 8 hours) before you must  be active again. You may have a decrease in mental alertness the day after use, even if you feel that you are fully awake. Tell your doctor if you will need to perform activities requiring full alertness, such as driving, the next day. Do not stand or sit up quickly after taking this medicine, especially if you are an older patient. This reduces the risk of dizzy or fainting spells. If you or your family notice any changes in your behavior, such as new or worsening depression, thoughts of harming yourself, anxiety, other  unusual or disturbing thoughts, or memory loss, call your doctor right away. After you stop taking this medicine, you may have trouble falling asleep. This is called rebound insomnia. This problem usually goes away on its own after 1 or 2 nights. What side effects may I notice from receiving this medicine? Side effects that you should report to your doctor or health care professional as soon as possible:  allergic reactions like skin rash, itching or hives, swelling of the face, lips, or tongue  breathing problems  changes in vision  confusion  depressed mood or other changes in moods or emotions  feeling faint or lightheaded, falls  hallucinations  loss of balance or coordination  loss of memory  numbness or tingling of the tongue  restlessness, excitability, or feelings of anxiety or agitation  signs and symptoms of liver injury like dark yellow or brown urine; general ill feeling or flu-like symptoms; light-colored stools; loss of appetite; nausea; right upper belly pain; unusually weak or tired; yellowing of the eyes or skin  suicidal thoughts  unusual activities while not fully awake like driving, eating, making phone calls, or sexual activity Side effects that usually do not require medical attention (report to your doctor or health care professional if they continue or are bothersome):  dizziness  drowsiness the day after you take this medicine  headache This list may not describe all possible side effects. Call your doctor for medical advice about side effects. You may report side effects to FDA at 1-800-FDA-1088. Where should I keep my medicine? Keep out of the reach of children. This medicine can be abused. Keep your medicine in a safe place to protect it from theft. Do not share this medicine with anyone. Selling or giving away this medicine is dangerous and against the law. This medicine may cause accidental overdose and death if taken by other adults, children,  or pets. Mix any unused medicine with a substance like cat litter or coffee grounds. Then throw the medicine away in a sealed container like a sealed bag or a coffee can with a lid. Do not use the medicine after the expiration date. Store at room temperature between 20 and 25 degrees C (68 and 77 degrees F). NOTE: This sheet is a summary. It may not cover all possible information. If you have questions about this medicine, talk to your doctor, pharmacist, or health care provider.  2021 Elsevier/Gold Standard (2020-10-05 09:54:34) Quetiapine tablets What is this medicine? QUETIAPINE (kwe TYE a peen) is an antipsychotic. It is used to treat schizophrenia and bipolar disorder, also known as manic-depression. This medicine may be used for other purposes; ask your health care provider or pharmacist if you have questions. COMMON BRAND NAME(S): Seroquel What should I tell my health care provider before I take this medicine? They need to know if you have any of these conditions:  blockage in your bowel  cataracts  constipation  dementia  diabetes  difficulty  swallowing  glaucoma  heart disease  high levels of prolactin  history of breast cancer  history of irregular heartbeat  liver disease  low blood counts, like low white cell, platelet, or red cell counts  low blood pressure  Parkinson's disease  prostate disease  seizures  suicidal thoughts, plans or attempt; a previous suicide attempt by you or a family member  thyroid disease  trouble passing urine  an unusual or allergic reaction to quetiapine, other medicines, foods, dyes, or preservatives  pregnant or trying to get pregnant  breast-feeding How should I use this medicine? Take this medicine by mouth. Swallow it with a drink of water. Follow the directions on the prescription label. If it upsets your stomach you can take it with food. Take your medicine at regular intervals. Do not take it more often than  directed. Do not stop taking except on the advice of your doctor or health care professional. A special MedGuide will be given to you by the pharmacist with each prescription and refill. Be sure to read this information carefully each time. Talk to your pediatrician regarding the use of this medicine in children. While this drug may be prescribed for children as young as 10 years for selected conditions, precautions do apply. Patients over age 6 years may have a stronger reaction to this medicine and need smaller doses. Overdosage: If you think you have taken too much of this medicine contact a poison control center or emergency room at once. NOTE: This medicine is only for you. Do not share this medicine with others. What if I miss a dose? If you miss a dose, take it as soon as you can. If it is almost time for your next dose, take only that dose. Do not take double or extra doses. What may interact with this medicine? Do not take this medicine with any of the following medications:  cisapride  dronedarone  metoclopramide  pimozide  thioridazine This medicine may also interact with the following medications:  alcohol  antihistamines for allergy, cough, and cold  atropine  avasimibe  certain antivirals for HIV or hepatitis  certain medicines for anxiety or sleep  certain medicines for bladder problems like oxybutynin, tolterodine  certain medicines for depression like amitriptyline, fluoxetine, nefazodone, sertraline  certain medicines for fungal infections like fluconazole, ketoconazole, itraconazole, posaconazole  certain medicines for stomach problems like dicyclomine, hyoscyamine  certain medicines for travel sickness like scopolamine  cimetidine  general anesthetics like halothane, isoflurane, methoxyflurane, propofol  ipratropium  levodopa or other medicines for Parkinson's disease  medicines for blood pressure  medicines for seizures  medicines that  relax muscles for surgery  narcotic medicines for pain  other medicines that prolong the QT interval (cause an abnormal heart rhythm)  phenothiazines like chlorpromazine, prochlorperazine  rifampin  St. John's wort This list may not describe all possible interactions. Give your health care provider a list of all the medicines, herbs, non-prescription drugs, or dietary supplements you use. Also tell them if you smoke, drink alcohol, or use illegal drugs. Some items may interact with your medicine. What should I watch for while using this medicine? Visit your health care professional for regular checks on your progress. Tell your health care professional if symptoms do not start to get better or if they get worse. Do not stop taking except on your health care professional's advice. You may develop a severe reaction. Your health care professional will tell you how much medicine to take. You may need  to have an eye exam before and during use of this medicine. This medicine may increase blood sugar. Ask your health care provider if changes in diet or medicines are needed if you have diabetes. Patients and their families should watch out for new or worsening depression or thoughts of suicide. Also watch out for sudden or severe changes in feelings such as feeling anxious, agitated, panicky, irritable, hostile, aggressive, impulsive, severely restless, overly excited and hyperactive, or not being able to sleep. If this happens, especially at the beginning of antidepressant treatment or after a change in dose, call your health care professional. Dennis Bast may get dizzy or drowsy. Do not drive, use machinery, or do anything that needs mental alertness until you know how this medicine affects you. Do not stand or sit up quickly, especially if you are an older patient. This reduces the risk of dizzy or fainting spells. Alcohol may interfere with the effect of this medicine. Avoid alcoholic drinks. This drug can  cause problems with controlling your body temperature. It can lower the response of your body to cold temperatures. If possible, stay indoors during cold weather. If you must go outdoors, wear warm clothes. It can also lower the response of your body to heat. Do not overheat. Do not over-exercise. Stay out of the sun when possible. If you must be in the sun, wear cool clothing. Drink plenty of water. If you have trouble controlling your body temperature, call your health care provider right away. What side effects may I notice from receiving this medicine? Side effects that you should report to your doctor or health care professional as soon as possible:  allergic reactions like skin rash, itching or hives, swelling of the face, lips, or tongue  breathing problems  changes in vision  confusion  elevated mood, decreased need for sleep, racing thoughts, impulsive behavior  eye pain  fast, irregular heartbeat  fever or chills, sore throat  inability to keep still  males: prolonged or painful erection  problems with balance, talking, walking  redness, blistering, peeling, or loosening of the skin, including inside the mouth  seizures  signs and symptoms of high blood sugar such as being more thirsty or hungry or having to urinate more than normal. You may also feel very tired or have blurry vision  signs and symptoms of hypothyroidism like fatigue; increased sensitivity to cold; weight gain; hoarseness; thinning hair  signs and symptoms of low blood pressure like dizziness; feeling faint or lightheaded; falls; unusually weak or tired  signs and symptoms of neuroleptic malignant syndrome like confusion; fast, irregular heartbeat; high fever; increased sweating; stiff muscles  sudden numbness or weakness of the face, arm, or leg  suicidal thoughts or other mood changes  trouble swallowing  uncontrollable movements of the arms, face, head, mouth, neck, or upper body Side effects  that usually do not require medical attention (report to your doctor or health care professional if they continue or are bothersome):  change in sex drive or performance  constipation  drowsiness  dry mouth  upset stomach  weight gain This list may not describe all possible side effects. Call your doctor for medical advice about side effects. You may report side effects to FDA at 1-800-FDA-1088. Where should I keep my medicine? Keep out of the reach of children. Store at room temperature between 15 and 30 degrees C (59 and 86 degrees F). Throw away any unused medicine after the expiration date. NOTE: This sheet is a summary. It may  not cover all possible information. If you have questions about this medicine, talk to your doctor, pharmacist, or health care provider.  2021 Elsevier/Gold Standard (2019-08-31 14:42:48)

## 2020-11-13 NOTE — Progress Notes (Addendum)
Virtual Visit via Video Note  I connected with Kathleen Carr on 11/13/20 at 10:20 AM EST by a video enabled telemedicine application and verified that I am speaking with the correct person using two identifiers. Parties involved in visit as below:   Location: Patient: at home  Provider: Provider: Provider's office at  [facility], [place].      I discussed the limitations of evaluation and management by telemedicine and the availability of in person appointments. The patient expressed understanding and agreed to proceed.  History of Present Illness:  She still suffers from Insomnia, she is taking Seroquel 300 mg for bipolar, she has always taken Ambien 10 mg for sleep she reports. I will give her Ambien 5mg  for sleep, not to mix with any other sedatives and discussed precautions.  She is still taking current dose of synthroid, She has no concerns or unwanted symptoms. Due for TSH now. Will come to lab for this.   She reports she does not drink alcohol.  She had hemidisectomy, L5 and L4. She is feeing better.  Denies any loss of bowel or bladder control.  Denies saddle paresthesias.  Denies radiculopathy/ paresthesias.    She has no other concerns at this visit.   Patient  denies any fever, body aches,chills, rash, chest pain, shortness of breath, nausea, vomiting, or diarrhea.  Denies dizziness, lightheadedness, pre syncopal or syncopal episodes.   Observations/Objective:   Patient is alert and oriented and responsive to questions Engages in conversation with provider. Speaks in full sentences without any pauses without any shortness of breath or distress.  Assessment and Plan:  Insomnia, unspecified type - Plan: zolpidem (AMBIEN) 5 MG tablet, TSH, CBC with Differential/Platelet, Comprehensive Metabolic Panel (CMET)  Diabetes mellitus without complication ([place]) - Plan: HgB A1c  Hypothyroidism, unspecified type - Plan: TSH  Bipolar 1 disorder ([facility]) - Plan:  QUEtiapine (SEROQUEL) 300 MG tablet   Orders Placed This Encounter  Procedures  . TSH  . CBC with Differential/Platelet  . Comprehensive Metabolic Panel (CMET)  . HgB A1c    Follow Up Instructions:   Red Flags discussed. The patient was given clear instructions to go to ER or return to medical center if any red flags develop, symptoms do not improve, worsen or new problems develop. They verbalized understanding.   Return in about 3 months (around 02/11/2021), or if symptoms worsen or fail to improve, for at any time for any worsening symptoms, Go to Emergency room/ urgent care if worse.   I discussed the assessment and treatment plan with the patient. The patient was provided an opportunity to ask questions and all were answered. The patient agreed with the plan and demonstrated an understanding of the instructions.   The patient was advised to call back or seek an in-person evaluation if the symptoms worsen or if the condition fails to improve as anticipated.  I provided 30 minutes of non-face-to-face time during this encounter. The entirety of the information documented in the History of Present Illness, Review of Systems and Physical Exam were personally obtained by me. Portions of this information were initially documented by the CMA and reviewed by me for thoroughness and accuracy.    I discussed the limitations of evaluation and management by telemedicine and the availability of in person appointments. The patient expressed understanding and agreed to proceed.  Marcille Buffy, FNP

## 2020-11-23 ENCOUNTER — Other Ambulatory Visit: Payer: Self-pay | Admitting: Orthopedic Surgery

## 2020-12-14 ENCOUNTER — Other Ambulatory Visit: Payer: Self-pay | Admitting: Adult Health

## 2020-12-14 DIAGNOSIS — G4700 Insomnia, unspecified: Secondary | ICD-10-CM

## 2020-12-14 NOTE — Telephone Encounter (Signed)
Requested medication (s) are due for refill today: yes  Requested medication (s) are on the active medication list: yes  Last refill: 11/13/2020  Future visit scheduled: no  Notes to clinic:  this refill cannot be delegated    Requested Prescriptions  Pending Prescriptions Disp Refills   zolpidem (AMBIEN) 5 MG tablet [Pharmacy Med Name: ZOLPIDEM TARTRATE 5 MG TAB 5 Tablet] 30 tablet 0    Sig: Take 1 tablet (5 mg total) by mouth at bedtime as needed for sleep.      Not Delegated - Psychiatry:  Anxiolytics/Hypnotics Failed - 12/14/2020 11:37 AM      Failed - This refill cannot be delegated      Failed - Urine Drug Screen completed in last 360 days      Passed - Valid encounter within last 6 months    Recent Outpatient Visits           1 month ago Insomnia, unspecified type   [place], FNP   4 months ago Acute left-sided low back pain without sciatica   [place], Kelby Aline, FNP

## 2020-12-14 NOTE — Telephone Encounter (Signed)
Refilled Ambien 12/14/20.Marland Kitchen

## 2021-01-14 ENCOUNTER — Other Ambulatory Visit: Payer: Self-pay | Admitting: Physician Assistant

## 2021-01-15 LAB — COMPREHENSIVE METABOLIC PANEL
ALT: 21 IU/L (ref 0–32)
AST: 19 IU/L (ref 0–40)
Albumin/Globulin Ratio: 2.2 (ref 1.2–2.2)
Albumin: 4.9 g/dL — ABNORMAL HIGH (ref 3.8–4.8)
Alkaline Phosphatase: 81 IU/L (ref 44–121)
BUN/Creatinine Ratio: 15 (ref 9–23)
BUN: 13 mg/dL (ref 6–24)
Bilirubin Total: 0.3 mg/dL (ref 0.0–1.2)
CO2: 22 mmol/L (ref 20–29)
Calcium: 10 mg/dL (ref 8.7–10.2)
Chloride: 103 mmol/L (ref 96–106)
Creatinine, Ser: 0.86 mg/dL (ref 0.57–1.00)
Globulin, Total: 2.2 g/dL (ref 1.5–4.5)
Glucose: 97 mg/dL (ref 65–99)
Potassium: 4.7 mmol/L (ref 3.5–5.2)
Sodium: 140 mmol/L (ref 134–144)
Total Protein: 7.1 g/dL (ref 6.0–8.5)
eGFR: 83 mL/min/{1.73_m2} (ref >59)

## 2021-01-15 LAB — CBC WITH DIFFERENTIAL/PLATELET
Basophils Absolute: 0.1 10*3/uL (ref 0.0–0.2)
Basos: 0 %
EOS (ABSOLUTE): 0.2 10*3/uL (ref 0.0–0.4)
Eos: 1 %
Hematocrit: 41 % (ref 34.0–46.6)
Hemoglobin: 14.4 g/dL (ref 11.1–15.9)
Immature Grans (Abs): 0.1 10*3/uL (ref 0.0–0.1)
Immature Granulocytes: 1 %
Lymphocytes Absolute: 6.7 10*3/uL — ABNORMAL HIGH (ref 0.7–3.1)
Lymphs: 44 %
MCH: 32.4 pg (ref 26.6–33.0)
MCHC: 35.1 g/dL (ref 31.5–35.7)
MCV: 92 fL (ref 79–97)
Monocytes Absolute: 0.7 10*3/uL (ref 0.1–0.9)
Monocytes: 5 %
Neutrophils Absolute: 7.6 10*3/uL — ABNORMAL HIGH (ref 1.4–7.0)
Neutrophils: 49 %
Platelets: 346 10*3/uL (ref 150–450)
RBC: 4.44 x10E6/uL (ref 3.77–5.28)
RDW: 12.6 % (ref 11.7–15.4)
WBC: 15.4 10*3/uL — ABNORMAL HIGH (ref 3.4–10.8)

## 2021-01-15 LAB — TSH: TSH: 0.803 u[IU]/mL (ref 0.450–4.500)

## 2021-01-15 LAB — HEMOGLOBIN A1C
Est. average glucose Bld gHb Est-mCnc: 146 mg/dL
Hgb A1c MFr Bld: 6.7 % — ABNORMAL HIGH (ref 4.8–5.6)

## 2021-01-18 ENCOUNTER — Encounter: Payer: Self-pay | Admitting: Adult Health

## 2021-01-18 NOTE — Progress Notes (Signed)
WBC elevated, any signs of infection ?  CMP ok.  Hemoglobin A1C is 6.7 continue current medications for now, and increase exercises and make lifestyle dietary changes.  TSH ok, continue medication same dose recheck TSH in 3 months.

## 2021-01-21 ENCOUNTER — Other Ambulatory Visit: Payer: Self-pay

## 2021-01-21 ENCOUNTER — Ambulatory Visit (INDEPENDENT_AMBULATORY_CARE_PROVIDER_SITE_OTHER): Payer: BLUE CROSS/BLUE SHIELD | Admitting: Adult Health

## 2021-01-21 ENCOUNTER — Other Ambulatory Visit: Payer: Self-pay | Admitting: Adult Health

## 2021-01-21 ENCOUNTER — Encounter: Payer: Self-pay | Admitting: Adult Health

## 2021-01-21 VITALS — BP 127/67 | HR 72 | Temp 98.1°F | Wt 184.0 lb

## 2021-01-21 DIAGNOSIS — G8929 Other chronic pain: Secondary | ICD-10-CM | POA: Diagnosis not present

## 2021-01-21 DIAGNOSIS — D72829 Elevated white blood cell count, unspecified: Secondary | ICD-10-CM

## 2021-01-21 DIAGNOSIS — B369 Superficial mycosis, unspecified: Secondary | ICD-10-CM

## 2021-01-21 DIAGNOSIS — G4700 Insomnia, unspecified: Secondary | ICD-10-CM | POA: Diagnosis not present

## 2021-01-21 DIAGNOSIS — H6243 Otitis externa in other diseases classified elsewhere, bilateral: Secondary | ICD-10-CM

## 2021-01-21 DIAGNOSIS — M545 Low back pain, unspecified: Secondary | ICD-10-CM

## 2021-01-21 DIAGNOSIS — R5383 Other fatigue: Secondary | ICD-10-CM | POA: Insufficient documentation

## 2021-01-21 LAB — POCT URINALYSIS DIPSTICK
Bilirubin, UA: NEGATIVE
Blood, UA: NEGATIVE
Glucose, UA: NEGATIVE
Ketones, UA: NEGATIVE
Leukocytes, UA: NEGATIVE
Nitrite, UA: NEGATIVE
Protein, UA: NEGATIVE
Spec Grav, UA: 1.015 (ref 1.010–1.025)
Urobilinogen, UA: 0.2 E.U./dL
pH, UA: 6 (ref 5.0–8.0)

## 2021-01-21 MED ORDER — FLUCONAZOLE 150 MG PO TABS: 150.0000 mg | ORAL_TABLET | ORAL | 0 refills | Status: DC

## 2021-01-21 MED ORDER — ZOLPIDEM TARTRATE 5 MG PO TABS: 5.0000 mg | ORAL_TABLET | Freq: Every evening | ORAL | 0 refills | Status: DC | PRN

## 2021-01-21 MED ORDER — ACETIC ACID 2 % OT SOLN: 4.0000 [drp] | Freq: Three times a day (TID) | OTIC | 2 refills | Status: DC

## 2021-01-21 MED ORDER — AMOXICILLIN-POT CLAVULANATE 875-125 MG PO TABS: 1.0000 | ORAL_TABLET | Freq: Two times a day (BID) | ORAL | 0 refills | Status: DC

## 2021-01-21 NOTE — Patient Instructions (Addendum)
Acetic Acid ear solution What is this medicine? ACETIC ACID (a SEE tik AS id) ear drops are used to treat external ear infections, such as "swimmer's ear". This medicine may be used for other purposes; ask your health care provider or pharmacist if you have questions. COMMON BRAND NAME(S): Acetasol, Borofair, VoSoL What should I tell my health care provider before I take this medicine? They need to know if you have any of these conditions:  ruptured ear drum  an unusual or allergic reaction to acetic acid, propylene glycol, other medicines, foods, dyes, or preservatives  pregnant or trying to get pregnant  breast-feeding How should I use this medicine? This medicine is only for use in your ears. Follow the directions on the prescription label. Wash your hands with soap and water. First, carefully clean your ear(s) with a dry cotton swab. Gently warm the bottle by holding it in the hand for 1 to 2 minutes. Use as directed by your doctor or health care professional. Shanda Howells down on your side with the affected ear up. Try not to touch the tip of the dropper to your ear, fingertips, or other surface. Squeeze the bottle gently to place the prescribed number of drops in the ear canal. Stay in this position for 30 to 60 seconds, and up to 5 minutes, to help the drops soak into the ear. Repeat, if necessary, for the opposite ear. Do not use your medicine more often than directed. Finish the full course of medicine prescribed by your health care professional even if you think your condition is better. Talk to your pediatrician regarding the use of this medicine in children. While this drug may be prescribed for children as young as 23 years of age and older for selected conditions, precautions do apply. Overdosage: If you think you have taken too much of this medicine contact a poison control center or emergency room at once. NOTE: This medicine is only for you. Do not share this medicine with others. What if  I miss a dose? If you miss a dose, use it as soon as you can. If it is almost time for your next dose, use only that dose. Do not use double or extra doses. What may interact with this medicine? Interactions are not expected. Do not use other ear products without talking to your doctor or health care professional. This list may not describe all possible interactions. Give your health care provider a list of all the medicines, herbs, non-prescription drugs, or dietary supplements you use. Also tell them if you smoke, drink alcohol, or use illegal drugs. Some items may interact with your medicine. What should I watch for while using this medicine? Tell your doctor or health care professional if your ear infection does not get better in a few days. If a rash or allergic reaction occurs, stop using this product right away and contact your doctor or health care professional. It is important that you keep the infected ear(s) clean and dry. When bathing, try not to get the infected ear(s) wet. Do not go swimming unless your doctor or health care professional has told you otherwise. To prevent the spread of infection, do not share ear products or share towels and washcloths with anyone else. What side effects may I notice from receiving this medicine? Side effects that you should report to your doctor or health care professional as soon as possible:  allergic reactions like skin rash, itching or hives, swelling of the face, lips, or tongue  burning and redness  worsening ear pain Side effects that usually do not require medical attention (report to your doctor or health care professional if they continue or are bothersome):  unpleasant feeling while putting the drops in the ear This list may not describe all possible side effects. Call your doctor for medical advice about side effects. You may report side effects to FDA at 1-800-FDA-1088. Where should I keep my medicine? Keep out of the reach of  children. Store at room temperature between 15 and 30 degrees C (59 and 86 degrees F). Throw away any unused medicine after the expiration date. NOTE: This sheet is a summary. It may not cover all possible information. If you have questions about this medicine, talk to your doctor, pharmacist, or health care provider.  2021 Elsevier/Gold Standard (2015-11-15 08:33:14) Leukocytosis Leukocytosis means that a person has more white blood cells than normal. White blood cells are made in the bone marrow. Bone marrow is the spongy tissue inside bones. The main job of white blood cells is to fight infection. Having too many white blood cells is a common condition. It can develop as a result of many types of medical problems. What are the causes? Leukocytosis may be caused by various conditions. In some cases, the bone marrow is normal but is still making too many white blood cells. This can be due to:  Infection.  Injury.  Physical stress.  Emotional stress.  Surgery.  Allergic reactions.  Tumors that do not start in the blood or bone marrow.  An inherited disease.  Certain medicines.  Pregnancy and labor. In other cases, a person may have a bone marrow disorder that is causing the body to make too many white blood cells. Bone marrow disorders include:  Leukemia. This is a type of blood cancer.  Myeloproliferative disorders. These disorders cause blood cells to grow abnormally. What are the signs or symptoms? Often, this condition causes no symptoms. Some people may have symptoms due to the medical condition that is causing their leukocytosis. These symptoms may include:  Bleeding.  Bruising.  Fever.  Night sweats.  Swollen lymph nodes.  An enlarged spleen.  Repeated infections.  Weakness.  Weight loss. How is this diagnosed? This condition is diagnosed with blood tests. It is often found when blood is tested as part of a routine physical exam. You may have other tests  to help determine why you have too many white blood cells. These tests may include:  A complete blood count (CBC). This test measures all the types of blood cells in your body.  Chest X-rays, urine tests, or other tests to look for signs of infection.  Bone marrow aspiration. For this test, a needle is put into your bone. Cells from the bone marrow are removed through the needle and examined under a microscope.  Other tests on the blood or bone marrow sample.  CT scan, bone scan, or other imaging tests.   How is this treated? Usually, treatment is not needed for leukocytosis. However, if an infection, cancer, bone marrow disorder, or other serious problem is causing your leukocytosis, it will need to be treated. Treatment may include:  Regular monitoring of your white blood cell count to look for changes.  Antibiotic medicine if you have a bacterial infection.  Bone marrow transplant. This treatment replaces your diseased bone marrow with healthy cells that will grow new bone marrow.  Chemotherapy or biological therapies such as the use of antibodies. These treatments may be used  to kill cancer cells or to decrease the number of white blood cells. Follow these instructions at home: Medicines  Take over-the-counter and prescription medicines only as told by your health care provider.  If you were prescribed an antibiotic medicine, take it as told by your health care provider. Do not stop taking the antibiotic even if you start to feel better. Eating and drinking  Eat foods that are low in saturated fats and high in fiber. Eat plenty of fruits and vegetables.  Drink enough fluid to keep your urine pale yellow.  Limit your intake of caffeine and alcohol.   General instructions  Maintain a healthy weight. Ask your health care provider what weight is best for you.  Do 30 minutes of exercise at least 5 times each week. Check with your health care provider before you start a new  exercise routine.  Follow any safety precautions as told by your health care provider. This may be needed if you are at increased risk for infection or bleeding because of your condition.  Do not use any products that contain nicotine or tobacco, such as cigarettes, e-cigarettes, and chewing tobacco. If you need help quitting, ask your health care provider.  Keep all follow-up visits as told by your health care provider. This is important. Contact a health care provider if you:  Feel weak or more tired than usual.  Develop chills, a cough, or nasal congestion.  Have a fever.  Lose weight without trying.  Have night sweats.  Bruise easily.  Have new or worsening symptoms. Get help right away if you:  Bleed more than normal.  Have chest pain.  Have trouble breathing.  Have uncontrolled nausea or vomiting.  Feel dizzy or light-headed. Summary  Leukocytosis means that a person has more white blood cells than normal.  This condition often causes no symptoms.  This condition may be caused by various conditions.  If an infection, cancer, bone marrow disorder, or other serious problem is causing your leukocytosis, it will need to be treated.  Keep all follow-up visits as told by your health care provider. This is important. This information is not intended to replace advice given to you by your health care provider. Make sure you discuss any questions you have with your health care provider. Document Revised: 07/08/2018 Document Reviewed: 07/08/2018 Elsevier Patient Education  2021 [employer].

## 2021-01-21 NOTE — Progress Notes (Signed)
Established patient visit   Patient: Kathleen Carr   DOB: May 20, 1971   51 y.o. Female  MRN: [id number] Visit Date: 01/21/2021  Today's healthcare provider: Marcille Buffy, FNP   No chief complaint on file.  Subjective    HPI  Patient is a 50 year old female who states she is here to discuss her last labwork.  Patient had labs done on 01/14/21 which included Hgb A1C, Met C, CBC and TSH.     Recommendation was as follows: Hemoglobin A1C is 6.7 continue current medications for now, and increase exercises and make lifestyle dietary changes. WBC elevated, any signs of infection ?  CMP ok.  Did have steroid injection the beginning of March.  She denies any URI symptoms., she has has lower back pain- chronic. She denies any change.  She does have itchy bilateral ear canals.    Patient  denies any fever, body aches,chills, rash, chest pain, shortness of breath, nausea, vomiting, or diarrhea.  Denies dizziness, lightheadedness, pre syncopal or syncopal episodes.    Lab Results  Component Value Date   CHOL 193 08/02/2020   HDL 61 08/02/2020   LDLCALC 106 (H) 08/02/2020   TRIG 151 (H) 08/02/2020   Lab Results  Component Value Date   TSH 0.803 01/14/2021   Lab Results  Component Value Date   HGBA1C 6.7 (H) 01/14/2021   Lab Results  Component Value Date   WBC 15.4 (H) 01/14/2021   HGB 14.4 01/14/2021   HCT 41.0 01/14/2021   MCV 92 01/14/2021   PLT 346 01/14/2021     Medications: Outpatient Medications Prior to Visit  Medication Sig  . cholecalciferol (VITAMIN D3) 25 MCG (1000 UNIT) tablet Take 1,000 Units by mouth daily.  Marland Kitchen levothyroxine (SYNTHROID) 50 MCG tablet Take 1 tablet (50 mcg total) by mouth daily before breakfast.  . loratadine (CLARITIN) 10 MG tablet Take 10 mg by mouth daily.  . metFORMIN (GLUCOPHAGE) 1000 MG tablet Take 1 tablet (1,000 mg total) by mouth daily with breakfast.  . Omega-3 Fatty Acids (FISH OIL) 1000 MG CAPS Take by mouth.  .  pantoprazole (PROTONIX) 40 MG tablet Take 1 tablet (40 mg total) by mouth daily.  . QUEtiapine (SEROQUEL) 300 MG tablet Take 1 tablet (300 mg total) by mouth at bedtime.  . rosuvastatin (CRESTOR) 5 MG tablet Take 1 tablet (5 mg total) by mouth daily.  . sertraline (ZOLOFT) 100 MG tablet TAKE 1 TABLET BY MOUTH DAILY.  . sitaGLIPtin (JANUVIA) 100 MG tablet Take 1 tablet (100 mg total) by mouth daily.  . [DISCONTINUED] zolpidem (AMBIEN) 5 MG tablet TAKE 1 TABLET (5 MG TOTAL) BY MOUTH AT BEDTIME AS NEEDED FOR SLEEP.  Marland Kitchen methocarbamol (ROBAXIN) 500 MG tablet Take 1 tablet (500 mg total) by mouth 2 (two) times daily.   No facility-administered medications prior to visit.    Review of Systems  Constitutional: Positive for fatigue. Negative for chills, diaphoresis and fever.  HENT: Negative for congestion, ear pain, postnasal drip, rhinorrhea, sinus pressure, sinus pain, sneezing and sore throat.   Respiratory: Negative for cough, shortness of breath and wheezing.   Cardiovascular: Negative for chest pain.  Gastrointestinal: Negative for abdominal pain, constipation, diarrhea, nausea and vomiting.  Genitourinary: Negative for difficulty urinating, dysuria, frequency and hematuria.        Objective    BP 127/67 (BP Location: Left Arm, Patient Position: Sitting, Cuff Size: Normal)   Pulse 72   Temp 98.1 F (36.7 C) (  Oral)   Wt 184 lb (83.5 kg)   SpO2 95%   BMI 31.58 kg/m  BP Readings from Last 3 Encounters:  01/21/21 127/67  08/01/20 (!) 124/97  07/31/20 (!) 131/94   Wt Readings from Last 3 Encounters:  01/21/21 184 lb (83.5 kg)  08/01/20 178 lb 6.4 oz (80.9 kg)  07/31/20 171 lb 15.3 oz (78 kg)       Physical Exam Vitals reviewed.  Constitutional:      General: She is not in acute distress.    Appearance: Normal appearance. She is not ill-appearing, toxic-appearing or diaphoretic.  HENT:     Head: Normocephalic and atraumatic.     Jaw: There is normal jaw occlusion.     Right  Ear: Tympanic membrane normal. Drainage (dry skin with white exudate scant bilateral ear canals. ) and swelling present. There is no impacted cerumen. Tympanic membrane is not perforated or erythematous.     Left Ear: Tympanic membrane and ear canal normal. Drainage and swelling present. There is no impacted cerumen. Tympanic membrane is not perforated or erythematous.     Nose: Nose normal.     Mouth/Throat:     Mouth: Mucous membranes are moist.  Eyes:     General: No scleral icterus.       Right eye: No discharge.        Left eye: No discharge.  Cardiovascular:     Rate and Rhythm: Normal rate and regular rhythm.     Pulses: Normal pulses.     Heart sounds: Normal heart sounds.  Pulmonary:     Effort: Pulmonary effort is normal.     Breath sounds: Normal breath sounds.  Abdominal:     Palpations: Abdomen is soft.  Musculoskeletal:        General: Normal range of motion.     Cervical back: Normal range of motion and neck supple.  Skin:    General: Skin is warm.  Neurological:     General: No focal deficit present.     Mental Status: She is oriented to person, place, and time.  Psychiatric:        Mood and Affect: Mood normal.        Behavior: Behavior normal.        Thought Content: Thought content normal.        Judgment: Judgment normal.        Results for orders placed or performed in visit on 01/21/21  POCT urinalysis dipstick  Result Value Ref Range   Color, UA yellow    Clarity, UA clear    Glucose, UA Negative Negative   Bilirubin, UA neg    Ketones, UA neg    Spec Grav, UA 1.015 1.010 - 1.025   Blood, UA neg    pH, UA 6.0 5.0 - 8.0   Protein, UA Negative Negative   Urobilinogen, UA 0.2 0.2 or 1.0 E.U./dL   Nitrite, UA neg    Leukocytes, UA Negative Negative   Appearance     Odor      Assessment & Plan     Leukocytosis, unspecified type - Plan: POCT urinalysis dipstick, CBC with Differential/Platelet, amoxicillin-clavulanate (AUGMENTIN) 875-125 MG  tablet, fluconazole (DIFLUCAN) 150 MG tablet  Chronic midline low back pain, unspecified whether sciatica present - Plan: POCT urinalysis dipstick, CBC with Differential/Platelet  Insomnia, unspecified type - Plan: zolpidem (AMBIEN) 5 MG tablet  Otitis externa, fungal, both ears - Plan: acetic acid 2 % otic solution  Fatigue, unspecified type  Requested Diflucan for antibiotic yeast if occurs.   Negative urine.  Wbc was over 15.  Will give course of Augmentin.  We will recheck CBC today.   She had a steroid injection could be cause of WBC elevation however she has mildly elevated leukocytes as well; Meds ordered this encounter  Medications  . amoxicillin-clavulanate (AUGMENTIN) 875-125 MG tablet    Sig: Take 1 tablet by mouth 2 (two) times daily.    Dispense:  20 tablet    Refill:  0  . fluconazole (DIFLUCAN) 150 MG tablet    Sig: Take 1 tablet (150 mg total) by mouth as directed. Take one tablet by mouth on day 1. May repeat dose of one tablet by mouth on day four.    Dispense:  2 tablet    Refill:  0  . zolpidem (AMBIEN) 5 MG tablet    Sig: Take 1 tablet (5 mg total) by mouth at bedtime as needed for sleep.    Dispense:  30 tablet    Refill:  0  . acetic acid 2 % otic solution    Sig: Place 4 drops into both ears 3 (three) times daily.    Dispense:  15 mL    Refill:  2   Orders Placed This Encounter  Procedures  . CBC with Differential/Platelet  . POCT urinalysis dipstick  labs today.  Return in about 3 weeks (around 02/11/2021), or if symptoms worsen or fail to improve, for at any time for any worsening symptoms, Go to Emergency room/ urgent care if worse.          Marcille Buffy, [place] [phone] (phone) [phone] (fax)  [place]

## 2021-01-22 ENCOUNTER — Other Ambulatory Visit: Payer: Self-pay | Admitting: Adult Health

## 2021-01-22 DIAGNOSIS — D72829 Elevated white blood cell count, unspecified: Secondary | ICD-10-CM

## 2021-01-22 LAB — CBC WITH DIFFERENTIAL/PLATELET
Basophils Absolute: 0.1 10*3/uL (ref 0.0–0.2)
Basos: 1 %
EOS (ABSOLUTE): 0.3 10*3/uL (ref 0.0–0.4)
Eos: 3 %
Hematocrit: 42.8 % (ref 34.0–46.6)
Hemoglobin: 14.7 g/dL (ref 11.1–15.9)
Immature Grans (Abs): 0.1 10*3/uL (ref 0.0–0.1)
Immature Granulocytes: 1 %
Lymphocytes Absolute: 5.7 10*3/uL — ABNORMAL HIGH (ref 0.7–3.1)
Lymphs: 49 %
MCH: 31.8 pg (ref 26.6–33.0)
MCHC: 34.3 g/dL (ref 31.5–35.7)
MCV: 93 fL (ref 79–97)
Monocytes Absolute: 0.7 10*3/uL (ref 0.1–0.9)
Monocytes: 6 %
Neutrophils Absolute: 4.7 10*3/uL (ref 1.4–7.0)
Neutrophils: 40 %
Platelets: 337 10*3/uL (ref 150–450)
RBC: 4.62 x10E6/uL (ref 3.77–5.28)
RDW: 12.6 % (ref 11.7–15.4)
WBC: 11.5 10*3/uL — ABNORMAL HIGH (ref 3.4–10.8)

## 2021-01-22 MED ORDER — METFORMIN HCL 1000 MG PO TABS
1000.0000 mg | ORAL_TABLET | Freq: Two times a day (BID) | ORAL | 0 refills | Status: DC
Start: 2021-01-22 — End: 2021-01-22

## 2021-01-22 NOTE — Progress Notes (Signed)
WBC trending down, complete antibiotic as directed and then recheck  CBC in 2- 3 weeks.  Return if any symptoms worsening. Lab orders placed for walk in.

## 2021-01-22 NOTE — Progress Notes (Signed)
Medications Discontinued During This Encounter  Medication Reason  . sitaGLIPtin (JANUVIA) 100 MG tablet Not covered by the pt's insurance

## 2021-01-22 NOTE — Progress Notes (Signed)
Meds ordered this encounter  Medications  . metFORMIN (GLUCOPHAGE) 1000 MG tablet    Sig: Take 1 tablet (1,000 mg total) by mouth 2 (two) times daily.    Dispense:  180 tablet    Refill:  0

## 2021-02-04 ENCOUNTER — Encounter: Payer: Self-pay | Admitting: Adult Health

## 2021-02-04 ENCOUNTER — Ambulatory Visit (INDEPENDENT_AMBULATORY_CARE_PROVIDER_SITE_OTHER): Payer: BLUE CROSS/BLUE SHIELD | Admitting: Adult Health

## 2021-02-04 ENCOUNTER — Other Ambulatory Visit: Payer: Self-pay

## 2021-02-04 ENCOUNTER — Other Ambulatory Visit: Payer: Self-pay | Admitting: Adult Health

## 2021-02-04 VITALS — BP 110/70 | HR 71 | Resp 16 | Ht 64.0 in | Wt 180.2 lb

## 2021-02-04 DIAGNOSIS — Z713 Dietary counseling and surveillance: Secondary | ICD-10-CM | POA: Diagnosis not present

## 2021-02-04 DIAGNOSIS — D72829 Elevated white blood cell count, unspecified: Secondary | ICD-10-CM | POA: Diagnosis not present

## 2021-02-04 DIAGNOSIS — T3695XA Adverse effect of unspecified systemic antibiotic, initial encounter: Secondary | ICD-10-CM

## 2021-02-04 DIAGNOSIS — Z6830 Body mass index (BMI) 30.0-30.9, adult: Secondary | ICD-10-CM

## 2021-02-04 DIAGNOSIS — B379 Candidiasis, unspecified: Secondary | ICD-10-CM

## 2021-02-04 DIAGNOSIS — F319 Bipolar disorder, unspecified: Secondary | ICD-10-CM

## 2021-02-04 DIAGNOSIS — Z01818 Encounter for other preprocedural examination: Secondary | ICD-10-CM | POA: Insufficient documentation

## 2021-02-04 MED ORDER — PANTOPRAZOLE SODIUM 40 MG PO TBEC
DELAYED_RELEASE_TABLET | Freq: Every day | ORAL | 0 refills | Status: DC
  Filled 2021-02-04: qty 90, 90d supply, fill #0
  Filled 2021-02-26: qty 30, 30d supply, fill #0

## 2021-02-04 MED ORDER — ROSUVASTATIN CALCIUM 5 MG PO TABS
ORAL_TABLET | Freq: Every day | ORAL | 1 refills | Status: DC
  Filled 2021-02-04: qty 90, 90d supply, fill #0
  Filled 2021-05-19: qty 90, 90d supply, fill #1

## 2021-02-04 MED ORDER — LEVOTHYROXINE SODIUM 50 MCG PO TABS
ORAL_TABLET | Freq: Every day | ORAL | 1 refills | Status: DC
  Filled 2021-02-04: qty 90, 90d supply, fill #0
  Filled 2021-05-19: qty 90, 90d supply, fill #1

## 2021-02-04 MED ORDER — SERTRALINE HCL 100 MG PO TABS
ORAL_TABLET | Freq: Every day | ORAL | 0 refills | Status: DC
  Filled 2021-02-04: qty 90, 90d supply, fill #0

## 2021-02-04 MED ORDER — FLUCONAZOLE 150 MG PO TABS
150.0000 mg | ORAL_TABLET | ORAL | 0 refills | Status: DC
  Filled 2021-02-04: qty 2, 4d supply, fill #0

## 2021-02-04 NOTE — Patient Instructions (Signed)
Leukocytosis Leukocytosis means that a person has more white blood cells than normal. White blood cells are made in the bone marrow. Bone marrow is the spongy tissue inside bones. The main job of white blood cells is to fight infection. Having too many white blood cells is a common condition. It can develop as a result of many types of medical problems. What are the causes? Leukocytosis may be caused by various conditions. In some cases, the bone marrow is normal but is still making too many white blood cells. This can be due to:  Infection.  Injury.  Physical stress.  Emotional stress.  Surgery.  Allergic reactions.  Tumors that do not start in the blood or bone marrow.  An inherited disease.  Certain medicines.  Pregnancy and labor. In other cases, a person may have a bone marrow disorder that is causing the body to make too many white blood cells. Bone marrow disorders include:  Leukemia. This is a type of blood cancer.  Myeloproliferative disorders. These disorders cause blood cells to grow abnormally. What are the signs or symptoms? Often, this condition causes no symptoms. Some people may have symptoms due to the medical condition that is causing their leukocytosis. These symptoms may include:  Bleeding.  Bruising.  Fever.  Night sweats.  Swollen lymph nodes.  An enlarged spleen.  Repeated infections.  Weakness.  Weight loss. How is this diagnosed? This condition is diagnosed with blood tests. It is often found when blood is tested as part of a routine physical exam. You may have other tests to help determine why you have too many white blood cells. These tests may include:  A complete blood count (CBC). This test measures all the types of blood cells in your body.  Chest X-rays, urine tests, or other tests to look for signs of infection.  Bone marrow aspiration. For this test, a needle is put into your bone. Cells from the bone marrow are removed  through the needle and examined under a microscope.  Other tests on the blood or bone marrow sample.  CT scan, bone scan, or other imaging tests.   How is this treated? Usually, treatment is not needed for leukocytosis. However, if an infection, cancer, bone marrow disorder, or other serious problem is causing your leukocytosis, it will need to be treated. Treatment may include:  Regular monitoring of your white blood cell count to look for changes.  Antibiotic medicine if you have a bacterial infection.  Bone marrow transplant. This treatment replaces your diseased bone marrow with healthy cells that will grow new bone marrow.  Chemotherapy or biological therapies such as the use of antibodies. These treatments may be used to kill cancer cells or to decrease the number of white blood cells. Follow these instructions at home: Medicines  Take over-the-counter and prescription medicines only as told by your health care provider.  If you were prescribed an antibiotic medicine, take it as told by your health care provider. Do not stop taking the antibiotic even if you start to feel better. Eating and drinking  Eat foods that are low in saturated fats and high in fiber. Eat plenty of fruits and vegetables.  Drink enough fluid to keep your urine pale yellow.  Limit your intake of caffeine and alcohol.   General instructions  Maintain a healthy weight. Ask your health care provider what weight is best for you.  Do 30 minutes of exercise at least 5 times each week. Check with your health care  provider before you start a new exercise routine.  Follow any safety precautions as told by your health care provider. This may be needed if you are at increased risk for infection or bleeding because of your condition.  Do not use any products that contain nicotine or tobacco, such as cigarettes, e-cigarettes, and chewing tobacco. If you need help quitting, ask your health care provider.  Keep all  follow-up visits as told by your health care provider. This is important. Contact a health care provider if you:  Feel weak or more tired than usual.  Develop chills, a cough, or nasal congestion.  Have a fever.  Lose weight without trying.  Have night sweats.  Bruise easily.  Have new or worsening symptoms. Get help right away if you:  Bleed more than normal.  Have chest pain.  Have trouble breathing.  Have uncontrolled nausea or vomiting.  Feel dizzy or light-headed. Summary  Leukocytosis means that a person has more white blood cells than normal.  This condition often causes no symptoms.  This condition may be caused by various conditions.  If an infection, cancer, bone marrow disorder, or other serious problem is causing your leukocytosis, it will need to be treated.  Keep all follow-up visits as told by your health care provider. This is important. This information is not intended to replace advice given to you by your health care provider. Make sure you discuss any questions you have with your health care provider. Document Revised: 07/08/2018 Document Reviewed: 07/08/2018 Elsevier Patient Education  2021 [place] for [employer] Calories are units of energy. Your body needs a certain number of calories from food to keep going throughout the day. When you eat or drink more calories than your body needs, your body stores the extra calories mostly as fat. When you eat or drink fewer calories than your body needs, your body burns fat to get the energy it needs. Calorie counting means keeping track of how many calories you eat and drink each day. Calorie counting can be helpful if you need to lose weight. If you eat fewer calories than your body needs, you should lose weight. Ask your health care provider what a healthy weight is for you. For calorie counting to work, you will need to eat the right number of calories each day to lose a healthy  amount of weight per week. A dietitian can help you figure out how many calories you need in a day and will suggest ways to reach your calorie goal.  A healthy amount of weight to lose each week is usually 1-2 lb (0.5-0.9 kg). This usually means that your daily calorie intake should be reduced by 500-750 calories.  Eating 1,200-1,500 calories a day can help most women lose weight.  Eating 1,500-1,800 calories a day can help most men lose weight. What do I need to know about calorie counting? Work with your health care provider or dietitian to determine how many calories you should get each day. To meet your daily calorie goal, you will need to:  Find out how many calories are in each food that you would like to eat. Try to do this before you eat.  Decide how much of the food you plan to eat.  Keep a food log. Do this by writing down what you ate and how many calories it had. To successfully lose weight, it is important to balance calorie counting with a healthy lifestyle that includes regular activity. Where do  I find calorie information? The number of calories in a food can be found on a Nutrition Facts label. If a food does not have a Nutrition Facts label, try to look up the calories online or ask your dietitian for help. Remember that calories are listed per serving. If you choose to have more than one serving of a food, you will have to multiply the calories per serving by the number of servings you plan to eat. For example, the label on a package of bread might say that a serving size is 1 slice and that there are 90 calories in a serving. If you eat 1 slice, you will have eaten 90 calories. If you eat 2 slices, you will have eaten 180 calories.   How do I keep a food log? After each time that you eat, record the following in your food log as soon as possible:  What you ate. Be sure to include toppings, sauces, and other extras on the food.  How much you ate. This can be measured in  cups, ounces, or number of items.  How many calories were in each food and drink.  The total number of calories in the food you ate. Keep your food log near you, such as in a pocket-sized notebook or on an app or website on your mobile phone. Some programs will calculate calories for you and show you how many calories you have left to meet your daily goal. What are some portion-control tips?  Know how many calories are in a serving. This will help you know how many servings you can have of a certain food.  Use a measuring cup to measure serving sizes. You could also try weighing out portions on a kitchen scale. With time, you will be able to estimate serving sizes for some foods.  Take time to put servings of different foods on your favorite plates or in your favorite bowls and cups so you know what a serving looks like.  Try not to eat straight from a food's packaging, such as from a bag or box. Eating straight from the package makes it hard to see how much you are eating and can lead to overeating. Put the amount you would like to eat in a cup or on a plate to make sure you are eating the right portion.  Use smaller plates, glasses, and bowls for smaller portions and to prevent overeating.  Try not to multitask. For example, avoid watching TV or using your computer while eating. If it is time to eat, sit down at a table and enjoy your food. This will help you recognize when you are full. It will also help you be more mindful of what and how much you are eating. What are tips for following this plan? Reading food labels  Check the calorie count compared with the serving size. The serving size may be smaller than what you are used to eating.  Check the source of the calories. Try to choose foods that are high in protein, fiber, and vitamins, and low in saturated fat, trans fat, and sodium. Shopping  Read nutrition labels while you shop. This will help you make healthy decisions about which  foods to buy.  Pay attention to nutrition labels for low-fat or fat-free foods. These foods sometimes have the same number of calories or more calories than the full-fat versions. They also often have added sugar, starch, or salt to make up for flavor that was removed with the  fat.  Make a grocery list of lower-calorie foods and stick to it. Cooking  Try to cook your favorite foods in a healthier way. For example, try baking instead of frying.  Use low-fat dairy products. Meal planning  Use more fruits and vegetables. One-half of your plate should be fruits and vegetables.  Include lean proteins, such as chicken, [place], and fish. Lifestyle Each week, aim to do one of the following:  150 minutes of moderate exercise, such as walking.  75 minutes of vigorous exercise, such as running. General information  Know how many calories are in the foods you eat most often. This will help you calculate calorie counts faster.  Find a way of tracking calories that works for you. Get creative. Try different apps or programs if writing down calories does not work for you. What foods should I eat?  Eat nutritious foods. It is better to have a nutritious, high-calorie food, such as an avocado, than a food with few nutrients, such as a bag of potato chips.  Use your calories on foods and drinks that will fill you up and will not leave you hungry soon after eating. ? Examples of foods that fill you up are nuts and nut butters, vegetables, lean proteins, and high-fiber foods such as whole grains. High-fiber foods are foods with more than 5 g of fiber per serving.  Pay attention to calories in drinks. Low-calorie drinks include water and unsweetened drinks. The items listed above may not be a complete list of foods and beverages you can eat. Contact a dietitian for more information.   What foods should I limit? Limit foods or drinks that are not good sources of vitamins, minerals, or protein or that  are high in unhealthy fats. These include:  Candy.  Other sweets.  Sodas, specialty coffee drinks, alcohol, and juice. The items listed above may not be a complete list of foods and beverages you should avoid. Contact a dietitian for more information. How do I count calories when eating out?  Pay attention to portions. Often, portions are much larger when eating out. Try these tips to keep portions smaller: ? Consider sharing a meal instead of getting your own. ? If you get your own meal, eat only half of it. Before you start eating, ask for a container and put half of your meal into it. ? When available, consider ordering smaller portions from the menu instead of full portions.  Pay attention to your food and drink choices. Knowing the way food is cooked and what is included with the meal can help you eat fewer calories. ? If calories are listed on the menu, choose the lower-calorie options. ? Choose dishes that include vegetables, fruits, whole grains, low-fat dairy products, and lean proteins. ? Choose items that are boiled, broiled, grilled, or steamed. Avoid items that are buttered, battered, fried, or served with cream sauce. Items labeled as crispy are usually fried, unless stated otherwise. ? Choose water, low-fat milk, unsweetened iced tea, or other drinks without added sugar. If you want an alcoholic beverage, choose a lower-calorie option, such as a glass of wine or light beer. ? Ask for dressings, sauces, and syrups on the side. These are usually high in calories, so you should limit the amount you eat. ? If you want a salad, choose a garden salad and ask for grilled meats. Avoid extra toppings such as bacon, cheese, or fried items. Ask for the dressing on the side, or ask for olive oil  and vinegar or lemon to use as dressing.  Estimate how many servings of a food you are given. Knowing serving sizes will help you be aware of how much food you are eating at restaurants. Where to  find more information  Centers for Disease Control and Prevention: http://www.wolf.info/  [place]. Department of Agriculture: http://www.wilson-mendoza.org/ Summary  Calorie counting means keeping track of how many calories you eat and drink each day. If you eat fewer calories than your body needs, you should lose weight.  A healthy amount of weight to lose per week is usually 1-2 lb (0.5-0.9 kg). This usually means reducing your daily calorie intake by 500-750 calories.  The number of calories in a food can be found on a Nutrition Facts label. If a food does not have a Nutrition Facts label, try to look up the calories online or ask your dietitian for help.  Use smaller plates, glasses, and bowls for smaller portions and to prevent overeating.  Use your calories on foods and drinks that will fill you up and not leave you hungry shortly after a meal. This information is not intended to replace advice given to you by your health care provider. Make sure you discuss any questions you have with your health care provider. Document Revised: 11/24/2019 Document Reviewed: 11/24/2019 Elsevier Patient Education  [place]. Phentermine tablets or capsules What is this medicine? PHENTERMINE (FEN ter meen) decreases your appetite. It is used with a reduced calorie diet and exercise to help you lose weight. This medicine may be used for other purposes; ask your health care provider or pharmacist if you have questions. COMMON BRAND NAME(S): Adipex-P, Atti-Plex P, Atti-Plex P Spansule, Fastin, Lomaira, Pro-Fast, Tara-8 What should I tell my health care provider before I take this medicine? They need to know if you have any of these conditions:  agitation or nervousness  diabetes  glaucoma  heart disease  high blood pressure  history of drug abuse or addiction  history of stroke  kidney disease  lung disease called Primary Pulmonary Hypertension (PPH)  taken an MAOI like Carbex, Eldepryl, Marplan, Nardil, or  Parnate in last 14 days  taking stimulant medicines for attention disorders, weight loss, or to stay awake  thyroid disease  an unusual or allergic reaction to phentermine, other medicines, foods, dyes, or preservatives  pregnant or trying to get pregnant  breast-feeding How should I use this medicine? Take this medicine by mouth with a glass of water. Follow the directions on the prescription label. Take your medicine at regular intervals. Do not take it more often than directed. Do not stop taking except on your doctor's advice. Talk to your pediatrician regarding the use of this medicine in children. While this drug may be prescribed for children 17 years or older for selected conditions, precautions do apply. Overdosage: If you think you have taken too much of this medicine contact a poison control center or emergency room at once. NOTE: This medicine is only for you. Do not share this medicine with others. What if I miss a dose? If you miss a dose, take it as soon as you can. If it is almost time for your next dose, take only that dose. Do not take double or extra doses. What may interact with this medicine? Do not take this medicine with any of the following medications:  MAOIs like Carbex, Eldepryl, Marplan, Nardil, and Parnate This medicine may also interact with the following medications:  alcohol  certain medicines for depression,  anxiety, or psychotic disorders  certain medicines for high blood pressure  linezolid  medicines for colds or breathing difficulties like pseudoephedrine or phenylephrine  medicines for diabetes  sibutramine  stimulant medicines for attention disorders, weight loss, or to stay awake This list may not describe all possible interactions. Give your health care provider a list of all the medicines, herbs, non-prescription drugs, or dietary supplements you use. Also tell them if you smoke, drink alcohol, or use illegal drugs. Some items may  interact with your medicine. What should I watch for while using this medicine? Visit your doctor or health care provider for regular checks on your progress. Do not stop taking except on your health care provider's advice. You may develop a severe reaction. Your health care provider will tell you how much medicine to take. Do not take this medicine close to bedtime. It may prevent you from sleeping. You may get drowsy or dizzy. Do not drive, use machinery, or do anything that needs mental alertness until you know how this medicine affects you. Do not stand or sit up quickly, especially if you are an older patient. This reduces the risk of dizzy or fainting spells. Alcohol may increase dizziness and drowsiness. Avoid alcoholic drinks. This medicine may affect blood sugar levels. Ask your healthcare provider if changes in diet or medicines are needed if you have diabetes. Women should inform their health care provider if they wish to become pregnant or think they might be pregnant. Losing weight while pregnant is not advised and may cause harm to the unborn child. Talk to your health care provider for more information. What side effects may I notice from receiving this medicine? Side effects that you should report to your doctor or health care professional as soon as possible:  allergic reactions like skin rash, itching or hives, swelling of the face, lips, or tongue  breathing problems  changes in emotions or moods  changes in vision  chest pain or chest tightness  fast, irregular heartbeat  feeling faint or lightheaded  increased blood pressure  irritable  restlessness  tremors  seizures  signs and symptoms of a stroke like changes in vision; confusion; trouble speaking or understanding; severe headaches; sudden numbness or weakness of the face, arm or leg; trouble walking; dizziness; loss of balance or coordination  unusually weak or tired Side effects that usually do not  require medical attention (report to your doctor or health care professional if they continue or are bothersome):  changes in taste  constipation or diarrhea  dizziness  dry mouth  headache  trouble sleeping  upset stomach This list may not describe all possible side effects. Call your doctor for medical advice about side effects. You may report side effects to FDA at 1-800-FDA-1088. Where should I keep my medicine? Keep out of the reach of children. This medicine can be abused. Keep your medicine in a safe place to protect it from theft. Do not share this medicine with anyone. Selling or giving away this medicine is dangerous and against the law. This medicine may cause harm and death if it is taken by other adults, children, or pets. Return medicine that has not been used to an official disposal site. Contact the DEA at 581-553-8672 or your city/county government to find a site. If you cannot return the medicine, mix any unused medicine with a substance like cat litter or coffee grounds. Then throw the medicine away in a sealed container like a sealed bag or coffee can  with a lid. Do not use the medicine after the expiration date. Store at room temperature between 20 and 25 degrees C (68 and 77 degrees F). Keep container tightly closed. NOTE: This sheet is a summary. It may not cover all possible information. If you have questions about this medicine, talk to your doctor, pharmacist, or health care provider.  2021 Elsevier/Gold Standard (2019-08-19 12:54:20)

## 2021-02-04 NOTE — Progress Notes (Signed)
Established patient visit   Patient: Kathleen Carr   DOB: 08-Aug-1971   50 y.o. Female  MRN: [id number] Visit Date: 02/04/2021  Today's healthcare provider: Marcille Buffy, FNP   Chief Complaint  Patient presents with  . Obesity   Subjective    HPI HPI    Patient would like to discuss weight management and control, patient reports that she is getting married in June and  and would like to loss 10-15lbs, patient states that she is unable to exercise due to chronic back problems.     Last edited by Minette Headland, CMA on 02/04/2021 10:59 AM. (History)      Patient  denies any fever, body aches,chills, rash, chest pain, shortness of breath, nausea, vomiting, or diarrhea.  Denies dizziness, lightheadedness, pre syncopal or syncopal episodes.       Medications: Outpatient Medications Prior to Visit  Medication Sig  . acetic acid 2 % otic solution PLACE 4 DROPS INTO BOTH EARS 3 (THREE) TIMES DAILY.  . cholecalciferol (VITAMIN D3) 25 MCG (1000 UNIT) tablet Take 1,000 Units by mouth daily.  Marland Kitchen gabapentin (NEURONTIN) 300 MG capsule TAKE 1 CAPSULE BY MOUTH TWICE DAILY FOR 30 DAYS  . loratadine (CLARITIN) 10 MG tablet Take 10 mg by mouth daily.  . metFORMIN (GLUCOPHAGE) 1000 MG tablet TAKE 1 TABLET (1,000 MG TOTAL) BY MOUTH 2 (TWO) TIMES DAILY.  Marland Kitchen Omega-3 Fatty Acids (FISH OIL) 1000 MG CAPS Take by mouth.  Marland Kitchen tiZANidine (ZANAFLEX) 4 MG tablet TAKE 1 TABLET BY MOUTH AS NEEDED AT BED TIME  . zolpidem (AMBIEN) 5 MG tablet TAKE 1 TABLET (5 MG TOTAL) BY MOUTH AT BEDTIME AS NEEDED FOR SLEEP.  . [DISCONTINUED] amoxicillin-clavulanate (AUGMENTIN) 875-125 MG tablet TAKE 1 TABLET BY MOUTH 2 (TWO) TIMES DAILY.  . [DISCONTINUED] levothyroxine (SYNTHROID) 50 MCG tablet TAKE 1 TABLET BY MOUTH DAILY BEFORE BREAKFAST.  . [DISCONTINUED] pantoprazole (PROTONIX) 40 MG tablet TAKE 1 TABLET BY MOUTH DAILY.  . [DISCONTINUED] QUEtiapine (SEROQUEL) 300 MG tablet TAKE 1 TABLET (300 MG TOTAL) BY  MOUTH AT BEDTIME.  . [DISCONTINUED] rosuvastatin (CRESTOR) 5 MG tablet TAKE 1 TABLET BY MOUTH DAILY.  . [DISCONTINUED] sertraline (ZOLOFT) 100 MG tablet TAKE 1 TABLET BY MOUTH DAILY  . [DISCONTINUED] acetaminophen-codeine (TYLENOL #3) 300-30 MG tablet TAKE 1 TABLET BY MOUTH EVERY 6 HOURS  . [DISCONTINUED] cyclobenzaprine (FLEXERIL) 10 MG tablet TAKE 1 TABLET BY MOUTH DAILY AT BEDTIME.  . [DISCONTINUED] fluconazole (DIFLUCAN) 150 MG tablet TAKE ONE TABLET BY MOUTH ON DAY 1. MAY REPEAT DOSE OF ONE TABLET BY MOUTH ON DAY FOUR.  . [DISCONTINUED] HYDROcodone-acetaminophen (NORCO/VICODIN) 5-325 MG tablet TAKE 1 TABLET BY MOUTH EVERY 6 HOURS AS NEEDED FOR PAIN  . [DISCONTINUED] HYDROcodone-acetaminophen (NORCO/VICODIN) 5-325 MG tablet TAKE 1 TABLET BY MOUTH EVERY 6 HOURS AS NEEDED FOR PAIN  . [DISCONTINUED] HYDROcodone-acetaminophen (NORCO/VICODIN) 5-325 MG tablet TAKE 1 TO 2 TABLETS BY MOUTH EVERY 6 HOURS AS NEEDED FOR PAIN  . [DISCONTINUED] meloxicam (MOBIC) 7.5 MG tablet TAKE 1 TABLET BY MOUTH TWICE A DAY WITH MEALS.  . [DISCONTINUED] meloxicam (MOBIC) 7.5 MG tablet TAKE 1 TABLET BY MOUTH ONCE DAILY  . [DISCONTINUED] methylPREDNISolone (MEDROL DOSEPAK) 4 MG TBPK tablet TAKE ALL 6 TABLETS BY MOUTH ON DAY 1, THEN DECREASE BY ONE TABLET EACH DAY (6-5-4-3-2-1)  . [DISCONTINUED] traMADol (ULTRAM) 50 MG tablet TAKE 1 TABLET BY MOUTH EVERY 6 HOURS AS NEEDED.   No facility-administered medications prior to visit.    Review of  Systems  Constitutional: Negative.   HENT: Negative.   Cardiovascular: Negative.   Gastrointestinal: Negative.   Genitourinary: Negative.   Musculoskeletal: Negative.   Neurological: Negative.        Objective    BP 110/70   Pulse 71   Resp 16   Ht 5\' 4"  (1.626 m)   Wt 180 lb 3.2 oz (81.7 kg)   SpO2 99%   BMI 30.93 kg/m  BP Readings from Last 3 Encounters:  02/04/21 110/70  01/21/21 127/67  08/01/20 (!) 124/97   Wt Readings from Last 3 Encounters:  02/04/21 180  lb 3.2 oz (81.7 kg)  01/21/21 184 lb (83.5 kg)  08/01/20 178 lb 6.4 oz (80.9 kg)       Physical Exam Vitals reviewed.  Constitutional:      General: She is not in acute distress.    Appearance: She is well-developed. She is not diaphoretic.     Interventions: She is not intubated. HENT:     Head: Normocephalic and atraumatic.     Right Ear: External ear normal.     Left Ear: External ear normal.     Nose: Nose normal.     Mouth/Throat:     Pharynx: No oropharyngeal exudate.  Eyes:     General: Lids are normal. No scleral icterus.       Right eye: No discharge.        Left eye: No discharge.     Conjunctiva/sclera: Conjunctivae normal.     Right eye: Right conjunctiva is not injected. No exudate or hemorrhage.    Left eye: Left conjunctiva is not injected. No exudate or hemorrhage.    Pupils: Pupils are equal, round, and reactive to light.  Neck:     Thyroid: No thyroid mass or thyromegaly.     Vascular: Normal carotid pulses. No carotid bruit, hepatojugular reflux or JVD.     Trachea: Trachea and phonation normal. No tracheal tenderness or tracheal deviation.     Meningeal: Brudzinski's sign and Kernig's sign absent.  Cardiovascular:     Rate and Rhythm: Normal rate and regular rhythm.     Pulses: Normal pulses.          Radial pulses are 2+ on the right side and 2+ on the left side.       Dorsalis pedis pulses are 2+ on the right side and 2+ on the left side.       Posterior tibial pulses are 2+ on the right side and 2+ on the left side.     Heart sounds: Normal heart sounds, S1 normal and S2 normal. Heart sounds not distant. No murmur heard. No friction rub. No gallop.   Pulmonary:     Effort: Pulmonary effort is normal. No tachypnea, bradypnea, accessory muscle usage or respiratory distress. She is not intubated.     Breath sounds: Normal breath sounds. No stridor. No wheezing or rales.  Chest:     Chest wall: No tenderness.  Breasts:     Right: No supraclavicular  adenopathy.     Left: No supraclavicular adenopathy.    Abdominal:     General: Bowel sounds are normal. There is no distension or abdominal bruit.     Palpations: Abdomen is soft. There is no shifting dullness, fluid wave, hepatomegaly, splenomegaly, mass or pulsatile mass.     Tenderness: There is no abdominal tenderness. There is no guarding or rebound.     Hernia: No hernia is present.  Musculoskeletal:  General: No tenderness or deformity. Normal range of motion.     Cervical back: Full passive range of motion without pain, normal range of motion and neck supple. No edema, erythema or rigidity. No spinous process tenderness or muscular tenderness. Normal range of motion.  Lymphadenopathy:     Head:     Right side of head: No submental, submandibular, tonsillar, preauricular, posterior auricular or occipital adenopathy.     Left side of head: No submental, submandibular, tonsillar, preauricular, posterior auricular or occipital adenopathy.     Cervical: No cervical adenopathy.     Right cervical: No superficial, deep or posterior cervical adenopathy.    Left cervical: No superficial, deep or posterior cervical adenopathy.     Upper Body:     Right upper body: No supraclavicular or pectoral adenopathy.     Left upper body: No supraclavicular or pectoral adenopathy.  Skin:    General: Skin is warm and dry.     Coloration: Skin is not pale.     Findings: No abrasion, bruising, burn, ecchymosis, erythema, lesion, petechiae or rash.     Nails: There is no clubbing.  Neurological:     Mental Status: She is alert and oriented to person, place, and time.     GCS: GCS eye subscore is 4. GCS verbal subscore is 5. GCS motor subscore is 6.     Cranial Nerves: No cranial nerve deficit.     Sensory: No sensory deficit.     Motor: No tremor, atrophy, abnormal muscle tone or seizure activity.     Coordination: Coordination normal.     Gait: Gait normal.     Deep Tendon Reflexes: Reflexes  are normal and symmetric. Reflexes normal. Babinski sign absent on the right side. Babinski sign absent on the left side.     Reflex Scores:      Tricep reflexes are 2+ on the right side and 2+ on the left side.      Bicep reflexes are 2+ on the right side and 2+ on the left side.      Brachioradialis reflexes are 2+ on the right side and 2+ on the left side.      Patellar reflexes are 2+ on the right side and 2+ on the left side.      Achilles reflexes are 2+ on the right side and 2+ on the left side. Psychiatric:        Speech: Speech normal.        Behavior: Behavior normal.        Thought Content: Thought content normal.        Judgment: Judgment normal.      No results found for any visits on 02/04/21.  Assessment & Plan     Antibiotic-induced yeast infection - Plan: DISCONTINUED: fluconazole (DIFLUCAN) 150 MG tablet  Leukocytosis, unspecified type  Weight loss counseling, encounter for  Body mass index (BMI) of 30.0-30.9 in adult  Meds ordered this encounter  Medications  . DISCONTD: fluconazole (DIFLUCAN) 150 MG tablet    Sig: Take 1 tablet (150 mg total) by mouth as directed. Take one tablet by mouth on day 1. May repeat dose of one tablet by mouth on day four.    Dispense:  2 tablet    Refill:  0   phentermine 37.5mg  po qd sent to pharmacy, patient verbalized understanding of risk versus benefit.   Follow up in one month blood pressure and weight check.   Return in about 1 month (around  03/06/2021), or if symptoms worsen or fail to improve, for at any time for any worsening symptoms, Go to Emergency room/ urgent care if worse.      The entirety of the information documented in the History of Present Illness, Review of Systems and Physical Exam were personally obtained by me. Portions of this information were initially documented by the CMA and reviewed by me for thoroughness and accuracy.      Marcille Buffy, [place] [phone] (phone) [phone] (fax)  [place]

## 2021-02-04 NOTE — Telephone Encounter (Signed)
Requested medications are due for refill today yes  Requested medications are on the active medication list yes  Last refill 11/13/20  Last visit 10/2020  Future visit scheduled 03/06/21  Notes to clinic Not Delegated.

## 2021-02-05 ENCOUNTER — Other Ambulatory Visit: Payer: Self-pay | Admitting: Adult Health

## 2021-02-05 ENCOUNTER — Other Ambulatory Visit: Payer: Self-pay

## 2021-02-05 ENCOUNTER — Encounter: Payer: Self-pay | Admitting: Adult Health

## 2021-02-05 DIAGNOSIS — D72829 Elevated white blood cell count, unspecified: Secondary | ICD-10-CM

## 2021-02-05 DIAGNOSIS — B379 Candidiasis, unspecified: Secondary | ICD-10-CM

## 2021-02-05 LAB — CBC WITH DIFFERENTIAL/PLATELET
Basophils Absolute: 0.1 10*3/uL (ref 0.0–0.2)
Basos: 1 %
EOS (ABSOLUTE): 0.2 10*3/uL (ref 0.0–0.4)
Eos: 2 %
Hematocrit: 44.3 % (ref 34.0–46.6)
Hemoglobin: 14.6 g/dL (ref 11.1–15.9)
Immature Grans (Abs): 0.1 10*3/uL (ref 0.0–0.1)
Immature Granulocytes: 1 %
Lymphocytes Absolute: 4.9 10*3/uL — ABNORMAL HIGH (ref 0.7–3.1)
Lymphs: 44 %
MCH: 31.3 pg (ref 26.6–33.0)
MCHC: 33 g/dL (ref 31.5–35.7)
MCV: 95 fL (ref 79–97)
Monocytes Absolute: 0.6 10*3/uL (ref 0.1–0.9)
Monocytes: 5 %
Neutrophils Absolute: 5.3 10*3/uL (ref 1.4–7.0)
Neutrophils: 47 %
Platelets: 308 10*3/uL (ref 150–450)
RBC: 4.66 x10E6/uL (ref 3.77–5.28)
RDW: 13 % (ref 11.7–15.4)
WBC: 11.2 10*3/uL — ABNORMAL HIGH (ref 3.4–10.8)

## 2021-02-05 LAB — RETICULOCYTES: Retic Ct Pct: 1.1 % (ref 0.6–2.6)

## 2021-02-05 MED ORDER — QUETIAPINE FUMARATE 300 MG PO TABS
ORAL_TABLET | ORAL | 0 refills | Status: DC
  Filled 2021-02-05: qty 90, 90d supply, fill #0

## 2021-02-05 MED ORDER — PHENTERMINE HCL 37.5 MG PO CAPS
37.5000 mg | ORAL_CAPSULE | ORAL | 0 refills | Status: DC
  Filled 2021-02-05: qty 30, 30d supply, fill #0

## 2021-02-05 MED ORDER — NYSTATIN 100000 UNIT/ML MT SUSP
OROMUCOSAL | 0 refills | Status: DC
  Filled 2021-02-05: qty 60, 3d supply, fill #0

## 2021-02-05 MED ORDER — FLUCONAZOLE 150 MG PO TABS: 150.0000 mg | ORAL_TABLET | ORAL | 0 refills | Status: DC

## 2021-02-05 MED ORDER — DOXYCYCLINE HYCLATE 100 MG PO TABS
100.0000 mg | ORAL_TABLET | Freq: Two times a day (BID) | ORAL | 0 refills | Status: DC
  Filled 2021-02-05: qty 20, 10d supply, fill #0

## 2021-02-05 NOTE — Progress Notes (Signed)
Meds ordered this encounter  Medications  . phentermine 37.5 MG capsule    Sig: Take 1 capsule (37.5 mg total) by mouth every morning.    Dispense:  30 capsule    Refill:  0

## 2021-02-05 NOTE — Progress Notes (Signed)
Meds ordered this encounter  Medications  . fluconazole (DIFLUCAN) 150 MG tablet    Sig: Take 1 tablet (150 mg total) by mouth as directed. Take one tablet by mouth on day 1. May repeat dose of one tablet by mouth on day four.    Dispense:  2 tablet    Refill:  0  . nystatin (MYCOSTATIN) 100000 UNIT/ML suspension    Sig: Use as needed up to four times daily 89ml in mouth swish hold and spit.    Dispense:  60 mL    Refill:  0

## 2021-02-05 NOTE — Progress Notes (Signed)
Meds ordered this encounter Medications  doxycycline (VIBRA-TABS) 100 MG tablet   Sig: Take 1 tablet (100 mg total) by mouth 2 (two) times daily.   Dispense:  20 tablet   Refill:  0  CBC still slightly elevated but improved with Augmentin, she has completed, would like her to take Doxycycline as prescribed antibiotic and recheck CBC in 3 to 4 weeks walk in at lab and return to office sooner if she starts to feel bad again or any new or worsening symptoms at anytime.

## 2021-02-05 NOTE — Progress Notes (Signed)
Meds ordered this encounter  Medications  . doxycycline (VIBRA-TABS) 100 MG tablet    Sig: Take 1 tablet (100 mg total) by mouth 2 (two) times daily.    Dispense:  20 tablet    Refill:  0   Orders Placed This Encounter  Procedures  . CBC with Differential/Platelet    Standing Status:   Future    Standing Expiration Date:   04/07/2021

## 2021-02-08 ENCOUNTER — Other Ambulatory Visit: Payer: Self-pay

## 2021-02-20 ENCOUNTER — Other Ambulatory Visit: Payer: Self-pay | Admitting: Adult Health

## 2021-02-20 DIAGNOSIS — G4700 Insomnia, unspecified: Secondary | ICD-10-CM

## 2021-02-21 ENCOUNTER — Other Ambulatory Visit: Payer: Self-pay

## 2021-02-21 MED ORDER — ZOLPIDEM TARTRATE 5 MG PO TABS
ORAL_TABLET | ORAL | 0 refills | Status: DC
  Filled 2021-02-21: qty 30, 30d supply, fill #0

## 2021-02-21 NOTE — Telephone Encounter (Signed)
Requested medication (s) are due for refill today: yes  Requested medication (s) are on the active medication list: yes   Last refill:  01/21/2021  Future visit scheduled: yes   Notes to clinic:  this refill cannot be delegated    Requested Prescriptions  Pending Prescriptions Disp Refills   zolpidem (AMBIEN) 5 MG tablet 30 tablet 0    Sig: TAKE 1 TABLET (5 MG TOTAL) BY MOUTH AT BEDTIME AS NEEDED FOR SLEEP.      Not Delegated - Psychiatry:  Anxiolytics/Hypnotics Failed - 02/20/2021 11:01 PM      Failed - This refill cannot be delegated      Failed - Urine Drug Screen completed in last 360 days      Passed - Valid encounter within last 6 months    Recent Outpatient Visits           2 weeks ago Antibiotic-induced yeast infection   [facility], Kelby Aline, FNP   1 month ago Leukocytosis, unspecified type   [facility], Kelby Aline, FNP   3 months ago Insomnia, unspecified type   [facility], Kelby Aline, FNP   6 months ago Acute left-sided low back pain without sciatica   [facility], Kelby Aline, FNP       Future Appointments             In 1 week Flinchum, Kelby Aline, [place], [facility]

## 2021-02-26 ENCOUNTER — Other Ambulatory Visit: Payer: Self-pay

## 2021-02-27 ENCOUNTER — Telehealth: Payer: Self-pay

## 2021-02-27 NOTE — Telephone Encounter (Signed)
Submitted a prior authorization for Protonix 40MG  Tablets.  KEY [id number]

## 2021-03-01 NOTE — Telephone Encounter (Signed)
Dann returned the call and asks if we can do an appeal for the medication. She states that she has tried everything else and was put on Protonix after being septic by her previous doctors.

## 2021-03-01 NOTE — Telephone Encounter (Signed)
Pt called returning your call 

## 2021-03-01 NOTE — Telephone Encounter (Signed)
Left a detailed message for Kathleen Carr that her Pantoprazole medication was denied. Case Details placed in Kathleen Carr's review folder.

## 2021-03-04 NOTE — Telephone Encounter (Signed)
Could try to do another prior authorization if she can give you all that she is taken in past, if unable to take any other medication may have to have her evaluate with gastrointestinal.

## 2021-03-06 ENCOUNTER — Other Ambulatory Visit: Payer: Self-pay

## 2021-03-06 ENCOUNTER — Encounter: Payer: Self-pay | Admitting: Adult Health

## 2021-03-06 ENCOUNTER — Ambulatory Visit (INDEPENDENT_AMBULATORY_CARE_PROVIDER_SITE_OTHER): Payer: Self-pay | Admitting: Adult Health

## 2021-03-06 VITALS — BP 90/58 | HR 78 | Temp 98.3°F | Ht 64.02 in | Wt 172.2 lb

## 2021-03-06 DIAGNOSIS — Z713 Dietary counseling and surveillance: Secondary | ICD-10-CM | POA: Diagnosis not present

## 2021-03-06 DIAGNOSIS — K219 Gastro-esophageal reflux disease without esophagitis: Secondary | ICD-10-CM | POA: Diagnosis not present

## 2021-03-06 DIAGNOSIS — K2970 Gastritis, unspecified, without bleeding: Secondary | ICD-10-CM | POA: Insufficient documentation

## 2021-03-06 MED ORDER — PHENTERMINE HCL 37.5 MG PO CAPS
37.5000 mg | ORAL_CAPSULE | ORAL | 0 refills | Status: DC
  Filled 2021-03-06: qty 30, 30d supply, fill #0

## 2021-03-06 MED ORDER — PANTOPRAZOLE SODIUM 40 MG PO TBEC
40.0000 mg | DELAYED_RELEASE_TABLET | Freq: Every day | ORAL | 11 refills | Status: DC
  Filled 2021-03-06 – 2021-03-13 (×3): qty 30, 30d supply, fill #0
  Filled 2021-04-09: qty 30, 30d supply, fill #1
  Filled 2021-05-19: qty 30, 30d supply, fill #2
  Filled 2021-06-18: qty 30, 30d supply, fill #3
  Filled 2021-07-18: qty 30, 30d supply, fill #4
  Filled 2021-08-19: qty 30, 30d supply, fill #5

## 2021-03-06 NOTE — Telephone Encounter (Signed)
Noted thanks °

## 2021-03-06 NOTE — Telephone Encounter (Signed)
Pt was in office to discuss prior authorization. Kathleen Carr stated that she has taken Pepcid, zantac, prilosec, nexium. Requests another prior authorization to be sent in for Protonix.

## 2021-03-06 NOTE — Progress Notes (Signed)
Established Patient Office Visit  Subjective:  Patient ID: Kathleen Carr, female    DOB: 1971-10-06  Age: 50 y.o. MRN: [id number]  CC:  Chief Complaint  Patient presents with  . Medication question    Pt wants to discuss prior authorization for protonix. Pt has tried Pepcid, zantac, prilosec, nexium    HPI  Kathleen Carr presents for here for weight check on Phentermine 37.5 mg once daily.   Blood pressure slightly low this morning, she is hydrated  She reports and will drink more today. She is asymptomatic.  Patient  denies any fever, body aches,chills, rash, chest pain, shortness of breath, nausea, vomiting, or diarrhea.  Denies any palpitations.   Denies dizziness, lightheadedness, pre syncopal or syncopal episodes.   Wt Readings from Last 3 Encounters:  03/06/21 172 lb 3.2 oz (78.1 kg)  02/04/21 180 lb 3.2 oz (81.7 kg)  01/21/21 184 lb (83.5 kg)   Filed Weights   03/06/21 1006  Weight: 172 lb 3.2 oz (78.1 kg)   Vitals with BMI 03/06/2021 02/04/2021 01/21/2021  Height 5' 4.016" 5' 4"  -  Weight 172 lbs 3 oz 180 lbs 3 oz 184 lbs  BMI 94.85 46.27 -  Systolic 90 035 009  Diastolic 58 70 67  Pulse 78 71 72   Past Medical History:  Diagnosis Date  . Allergy   . Anxiety   . Arthritis   . Asthma   . Bipolar depression ([place])   . Depression   . Diabetes mellitus without complication ([place])   . GERD (gastroesophageal reflux disease)   . Hypertension   . PTSD (post-traumatic stress disorder)   . PTSD (post-traumatic stress disorder)   . Thyroid disease     Past Surgical History:  Procedure Laterality Date  . ABDOMINAL HYSTERECTOMY    . CHOLECYSTECTOMY    . TONSILLECTOMY      Family History  Problem Relation Age of Onset  . Hypertension Mother   . Thyroid disease Mother   . Ulcerative colitis Father   . Glaucoma Maternal Grandmother   . Renal Disease Maternal Grandfather   . Diabetes Maternal Grandfather        Type2  . Stroke Paternal Grandfather   .  Hypertension Paternal Grandfather     Social History   Socioeconomic History  . Marital status: Married    Spouse name: Not on file  . Number of children: Not on file  . Years of education: Not on file  . Highest education level: Not on file  Occupational History  . Not on file  Tobacco Use  . Smoking status: Former Smoker    Quit date: 05/25/2020    Years since quitting: 0.7  . Smokeless tobacco: Never Used  Vaping Use  . Vaping Use: Never used  Substance and Sexual Activity  . Alcohol use: Not Currently  . Drug use: Not on file  . Sexual activity: Not on file  Other Topics Concern  . Not on file  Social History Narrative  . Not on file   Social Determinants of Health   Financial Resource Strain: Not on file  Food Insecurity: Not on file  Transportation Needs: Not on file  Physical Activity: Not on file  Stress: Not on file  Social Connections: Not on file  Intimate Partner Violence: Not on file    Outpatient Medications Prior to Visit  Medication Sig Dispense Refill  . acetic acid 2 % otic solution PLACE 4 DROPS INTO BOTH EARS 3 (  THREE) TIMES DAILY. 15 mL 2  . cholecalciferol (VITAMIN D3) 25 MCG (1000 UNIT) tablet Take 1,000 Units by mouth daily.    Marland Kitchen levothyroxine (SYNTHROID) 50 MCG tablet TAKE 1 TABLET BY MOUTH DAILY BEFORE BREAKFAST. 90 tablet 1  . loratadine (CLARITIN) 10 MG tablet Take 10 mg by mouth daily.    . metFORMIN (GLUCOPHAGE) 1000 MG tablet TAKE 1 TABLET (1,000 MG TOTAL) BY MOUTH 2 (TWO) TIMES DAILY. 180 tablet 0  . Omega-3 Fatty Acids (FISH OIL) 1000 MG CAPS Take by mouth.    . QUEtiapine (SEROQUEL) 300 MG tablet TAKE 1 TABLET (300 MG TOTAL) BY MOUTH AT BEDTIME. 90 tablet 0  . rosuvastatin (CRESTOR) 5 MG tablet TAKE 1 TABLET BY MOUTH DAILY. 90 tablet 1  . sertraline (ZOLOFT) 100 MG tablet TAKE 1 TABLET BY MOUTH DAILY 90 tablet 0  . zolpidem (AMBIEN) 5 MG tablet TAKE 1 TABLET (5 MG TOTAL) BY MOUTH AT BEDTIME AS NEEDED FOR SLEEP. 30 tablet 0  .  pantoprazole (PROTONIX) 40 MG tablet TAKE 1 TABLET BY MOUTH DAILY. 90 tablet 0  . phentermine 37.5 MG capsule Take 1 capsule (37.5 mg total) by mouth every morning. 30 capsule 0  . doxycycline (VIBRA-TABS) 100 MG tablet Take 1 tablet (100 mg total) by mouth 2 (two) times daily. (Patient not taking: Reported on 03/06/2021) 20 tablet 0  . fluconazole (DIFLUCAN) 150 MG tablet Take 1 tablet (150 mg total) by mouth as directed. Take one tablet by mouth on day 1. May repeat dose of one tablet by mouth on day four. (Patient not taking: Reported on 03/06/2021) 2 tablet 0  . nystatin (MYCOSTATIN) 100000 UNIT/ML suspension Use as needed up to four times daily 62m in mouth swish hold and spit. (Patient not taking: Reported on 03/06/2021) 60 mL 0  . tiZANidine (ZANAFLEX) 4 MG tablet TAKE 1 TABLET BY MOUTH AS NEEDED AT BED TIME (Patient not taking: Reported on 03/06/2021) 30 tablet 0   No facility-administered medications prior to visit.    Allergies  Allergen Reactions  . Dust Mite Mixed Allergen Ext [Mite (D. Farinae)]     Respiratory distresss  . Sulfa Antibiotics Hives  . Other     Allergy to [place]ory, walnut and Birch trees and all grasses and allergic to Rabbits- patient reports anaphylactic     ROS Review of Systems  Constitutional: Negative.   HENT: Negative.   Respiratory: Negative.   Cardiovascular: Negative.   Gastrointestinal: Negative.   Genitourinary: Negative.   Musculoskeletal: Negative.   Neurological: Negative.   Hematological: Negative.   Psychiatric/Behavioral: Negative.       Objective:    Physical Exam Constitutional:      General: She is not in acute distress.    Appearance: Normal appearance. She is not ill-appearing, toxic-appearing or diaphoretic.  HENT:     Head: Normocephalic and atraumatic.     Right Ear: External ear normal.     Left Ear: External ear normal.     Nose: Nose normal.     Mouth/Throat:     Mouth: Mucous membranes are moist.  Eyes:      Conjunctiva/sclera: Conjunctivae normal.  Neck:     Vascular: No carotid bruit.  Cardiovascular:     Rate and Rhythm: Normal rate and regular rhythm.     Pulses: Normal pulses.     Heart sounds: Normal heart sounds. No murmur heard. No friction rub. No gallop.   Pulmonary:     Effort: Pulmonary effort is normal.  No respiratory distress.     Breath sounds: Normal breath sounds. No stridor. No wheezing, rhonchi or rales.  Chest:     Chest wall: No tenderness.  Abdominal:     Palpations: Abdomen is soft.  Musculoskeletal:        General: Normal range of motion.     Cervical back: Normal range of motion and neck supple. No rigidity or tenderness.  Lymphadenopathy:     Cervical: No cervical adenopathy.  Skin:    General: Skin is warm.     Findings: No erythema or rash.  Neurological:     General: No focal deficit present.     Mental Status: She is oriented to person, place, and time. Mental status is at baseline.     Motor: No weakness.     Gait: Gait normal.  Psychiatric:        Mood and Affect: Mood normal.        Behavior: Behavior normal.        Thought Content: Thought content normal.        Judgment: Judgment normal.     BP (!) 90/58 (BP Location: Left Arm, Patient Position: Sitting)   Pulse 78   Temp 98.3 F (36.8 C)   Ht 5' 4.02" (1.626 m)   Wt 172 lb 3.2 oz (78.1 kg)   SpO2 95%   BMI 29.54 kg/m  Wt Readings from Last 3 Encounters:  03/06/21 172 lb 3.2 oz (78.1 kg)  02/04/21 180 lb 3.2 oz (81.7 kg)  01/21/21 184 lb (83.5 kg)     Health Maintenance Due  Topic Date Due  . URINE MICROALBUMIN  Never done  . PAP SMEAR-Modifier  Never done  . COLONOSCOPY (Pts 45-47yr Insurance coverage will need to be confirmed)  Never done    There are no preventive care reminders to display for this patient.  Lab Results  Component Value Date   TSH 0.803 01/14/2021   Lab Results  Component Value Date   WBC 11.2 (H) 02/04/2021   HGB 14.6 02/04/2021   HCT 44.3  02/04/2021   MCV 95 02/04/2021   PLT 308 02/04/2021   Lab Results  Component Value Date   NA 140 01/14/2021   K 4.7 01/14/2021   CO2 22 01/14/2021   GLUCOSE 97 01/14/2021   BUN 13 01/14/2021   CREATININE 0.86 01/14/2021   BILITOT 0.3 01/14/2021   ALKPHOS 81 01/14/2021   AST 19 01/14/2021   ALT 21 01/14/2021   PROT 7.1 01/14/2021   ALBUMIN 4.9 (H) 01/14/2021   CALCIUM 10.0 01/14/2021   EGFR 83 01/14/2021   Lab Results  Component Value Date   CHOL 193 08/02/2020   Lab Results  Component Value Date   HDL 61 08/02/2020   Lab Results  Component Value Date   LDLCALC 106 (H) 08/02/2020   Lab Results  Component Value Date   TRIG 151 (H) 08/02/2020   No results found for: CHOLHDL Lab Results  Component Value Date   HGBA1C 6.7 (H) 01/14/2021      Assessment & Plan:   Problem List Items Addressed This Visit      Other   Weight loss counseling, encounter for - Primary   Relevant Medications   phentermine 37.5 MG capsule    Other Visit Diagnoses    Gastroesophageal reflux disease, unspecified whether esophagitis present       Relevant Medications   pantoprazole (PROTONIX) 40 MG tablet     Per weight above she  has lost weight with phentermine, denies any unwanted side effects feels well.would like to continue for 2 more months a total of 3 months.  She will call if any unwanted side effect and will discontinue medication immediately.   Discussed blood pressure being low end normal she is asymptomatic feels well and will increase hydration.   Meds ordered this encounter  Medications  . pantoprazole (PROTONIX) 40 MG tablet    Sig: Take 1 tablet (40 mg total) by mouth daily.    Dispense:  30 tablet    Refill:  11  . phentermine 37.5 MG capsule    Sig: Take 1 capsule (37.5 mg total) by mouth every morning.    Dispense:  30 capsule    Refill:  0   We will send in appeal for GERD medication as she has tried all the alternatives [facility] has  suggested per patient. She has been on Protonix for years.   Return precautions given. Red Flags discussed. The patient was given clear instructions to go to ER or return to medical center if any red flags develop, symptoms do not improve, worsen or new problems develop. They verbalized understanding.    Risks, benefits, and alternatives of the medications and treatment plan prescribed today were discussed, and patient expressed understanding.    Education regarding symptom management and diagnosis given to patient on AVS.  Patient was in agreement with treatment plan.   Continue to follow with  Kelby Aline. Aliscia Clayton AGNP-C, FNP-C for routine health maintenance.   Kelby Aline. Annjeanette Sarwar AGNP-C, FNP-C  Follow-up: Return in about 1 month (around 04/06/2021), or if symptoms worsen or fail to improve, for at any time for any worsening symptoms, Go to Emergency room/ urgent care if worse.    Marcille Buffy, FNP

## 2021-03-06 NOTE — Patient Instructions (Signed)
Mediterranean Diet A Mediterranean diet refers to food and lifestyle choices that are based on the traditions of countries located on the [place]. This way of eating has been shown to help prevent certain conditions and improve outcomes for people who have chronic diseases, like kidney disease and heart disease. What are tips for following this plan? Lifestyle  Cook and eat meals together with your family, when possible.  Drink enough fluid to keep your urine clear or pale yellow.  Be physically active every day. This includes: ? Aerobic exercise like running or swimming. ? Leisure activities like gardening, walking, or housework.  Get 7-8 hours of sleep each night.  If recommended by your health care provider, drink red wine in moderation. This means 1 glass a day for nonpregnant women and 2 glasses a day for men. A glass of wine equals 5 oz (150 mL). Reading food labels  Check the serving size of packaged foods. For foods such as rice and pasta, the serving size refers to the amount of cooked product, not dry.  Check the total fat in packaged foods. Avoid foods that have saturated fat or trans fats.  Check the ingredients list for added sugars, such as corn syrup.   Shopping  At the grocery store, buy most of your food from the areas near the walls of the store. This includes: ? Fresh fruits and vegetables (produce). ? Grains, beans, nuts, and seeds. Some of these may be available in unpackaged forms or large amounts (in bulk). ? Fresh seafood. ? Poultry and eggs. ? Low-fat dairy products.  Buy whole ingredients instead of prepackaged foods.  Buy fresh fruits and vegetables in-season from local farmers markets.  Buy frozen fruits and vegetables in resealable bags.  If you do not have access to quality fresh seafood, buy precooked frozen shrimp or canned fish, such as tuna, salmon, or sardines.  Buy small amounts of raw or cooked vegetables, salads, or olives from  the deli or salad bar at your store.  Stock your pantry so you always have certain foods on hand, such as olive oil, canned tuna, canned tomatoes, rice, pasta, and beans. Cooking  Cook foods with extra-virgin olive oil instead of using butter or other vegetable oils.  Have meat as a side dish, and have vegetables or grains as your main dish. This means having meat in small portions or adding small amounts of meat to foods like pasta or stew.  Use beans or vegetables instead of meat in common dishes like chili or lasagna.  Experiment with different cooking methods. Try roasting or broiling vegetables instead of steaming or sauteing them.  Add frozen vegetables to soups, stews, pasta, or rice.  Add nuts or seeds for added healthy fat at each meal. You can add these to yogurt, salads, or vegetable dishes.  Marinate fish or vegetables using olive oil, lemon juice, garlic, and fresh herbs. Meal planning  Plan to eat 1 vegetarian meal one day each week. Try to work up to 2 vegetarian meals, if possible.  Eat seafood 2 or more times a week.  Have healthy snacks readily available, such as: ? Vegetable sticks with hummus. ? Greek yogurt. ? Fruit and nut trail mix.  Eat balanced meals throughout the week. This includes: ? Fruit: 2-3 servings a day ? Vegetables: 4-5 servings a day ? Low-fat dairy: 2 servings a day ? Fish, poultry, or lean meat: 1 serving a day ? Beans and legumes: 2 or more servings a week ?   Nuts and seeds: 1-2 servings a day ? Whole grains: 6-8 servings a day ? Extra-virgin olive oil: 3-4 servings a day  Limit red meat and sweets to only a few servings a month   What are my food choices?  Mediterranean diet ? Recommended  Grains: Whole-grain pasta. Brown rice. Bulgar wheat. Polenta. Couscous. Whole-wheat bread. [place].  Vegetables: Artichokes. Beets. Broccoli. Cabbage. Carrots. Eggplant. Green beans. Chard. Kale. Spinach. Onions. Leeks. Peas. Squash.  Tomatoes. Peppers. Radishes.  Fruits: Apples. Apricots. Avocado. Berries. Bananas. Cherries. Dates. Figs. Grapes. Lemons. Melon. Oranges. Peaches. Plums. Pomegranate.  Meats and other protein foods: Beans. Almonds. Sunflower seeds. Pine nuts. Peanuts. [place]. Salmon. Scallops. Shrimp. [place]. Tilapia. Clams. Oysters. Eggs.  Dairy: Low-fat milk. Cheese. Greek yogurt.  Beverages: Water. Red wine. Herbal tea.  Fats and oils: Extra virgin olive oil. Avocado oil. Grape seed oil.  Sweets and desserts: [place] yogurt with honey. Baked apples. Poached pears. Trail mix.  Seasoning and other foods: Basil. Cilantro. Coriander. Cumin. Mint. Parsley. Sage. Rosemary. Tarragon. Garlic. Oregano. Thyme. Pepper. Balsalmic vinegar. Tahini. Hummus. Tomato sauce. Olives. Mushrooms. ? Limit these  Grains: Prepackaged pasta or rice dishes. Prepackaged cereal with added sugar.  Vegetables: Deep fried potatoes (french fries).  Fruits: Fruit canned in syrup.  Meats and other protein foods: Beef. Pork. Lamb. Poultry with skin. Hot dogs. Berniece Salines.  Dairy: Ice cream. Sour cream. Whole milk.  Beverages: Juice. Sugar-sweetened soft drinks. Beer. Liquor and spirits.  Fats and oils: Butter. Canola oil. Vegetable oil. Beef fat (tallow). Lard.  Sweets and desserts: Cookies. Cakes. Pies. Candy.  Seasoning and other foods: Mayonnaise. Premade sauces and marinades. The items listed may not be a complete list. Talk with your dietitian about what dietary choices are right for you. Summary  The Mediterranean diet includes both food and lifestyle choices.  Eat a variety of fresh fruits and vegetables, beans, nuts, seeds, and whole grains.  Limit the amount of red meat and sweets that you eat.  Talk with your health care provider about whether it is safe for you to drink red wine in moderation. This means 1 glass a day for nonpregnant women and 2 glasses a day for men. A glass of wine equals 5 oz (150 mL). This information  is not intended to replace advice given to you by your health care provider. Make sure you discuss any questions you have with your health care provider. Document Revised: 06/12/2016 Document Reviewed: 06/05/2016 Elsevier Patient Education  [place]. Phentermine tablets or capsules What is this medicine? PHENTERMINE (FEN ter meen) decreases your appetite. It is used with a reduced calorie diet and exercise to help you lose weight. This medicine may be used for other purposes; ask your health care provider or pharmacist if you have questions. COMMON BRAND NAME(S): Adipex-P, Atti-Plex P, Atti-Plex P Spansule, Fastin, Lomaira, Pro-Fast, Tara-8 What should I tell my health care provider before I take this medicine? They need to know if you have any of these conditions:  agitation or nervousness  diabetes  glaucoma  heart disease  high blood pressure  history of drug abuse or addiction  history of stroke  kidney disease  lung disease called Primary Pulmonary Hypertension (PPH)  taken an MAOI like Carbex, Eldepryl, Marplan, Nardil, or Parnate in last 14 days  taking stimulant medicines for attention disorders, weight loss, or to stay awake  thyroid disease  an unusual or allergic reaction to phentermine, other medicines, foods, dyes, or preservatives  pregnant or trying to  get pregnant  breast-feeding How should I use this medicine? Take this medicine by mouth with a glass of water. Follow the directions on the prescription label. Take your medicine at regular intervals. Do not take it more often than directed. Do not stop taking except on your doctor's advice. Talk to your pediatrician regarding the use of this medicine in children. While this drug may be prescribed for children 17 years or older for selected conditions, precautions do apply. Overdosage: If you think you have taken too much of this medicine contact a poison control center or emergency room at  once. NOTE: This medicine is only for you. Do not share this medicine with others. What if I miss a dose? If you miss a dose, take it as soon as you can. If it is almost time for your next dose, take only that dose. Do not take double or extra doses. What may interact with this medicine? Do not take this medicine with any of the following medications:  MAOIs like Carbex, Eldepryl, Marplan, Nardil, and Parnate This medicine may also interact with the following medications:  alcohol  certain medicines for depression, anxiety, or psychotic disorders  certain medicines for high blood pressure  linezolid  medicines for colds or breathing difficulties like pseudoephedrine or phenylephrine  medicines for diabetes  sibutramine  stimulant medicines for attention disorders, weight loss, or to stay awake This list may not describe all possible interactions. Give your health care provider a list of all the medicines, herbs, non-prescription drugs, or dietary supplements you use. Also tell them if you smoke, drink alcohol, or use illegal drugs. Some items may interact with your medicine. What should I watch for while using this medicine? Visit your doctor or health care provider for regular checks on your progress. Do not stop taking except on your health care provider's advice. You may develop a severe reaction. Your health care provider will tell you how much medicine to take. Do not take this medicine close to bedtime. It may prevent you from sleeping. You may get drowsy or dizzy. Do not drive, use machinery, or do anything that needs mental alertness until you know how this medicine affects you. Do not stand or sit up quickly, especially if you are an older patient. This reduces the risk of dizzy or fainting spells. Alcohol may increase dizziness and drowsiness. Avoid alcoholic drinks. This medicine may affect blood sugar levels. Ask your healthcare provider if changes in diet or medicines are  needed if you have diabetes. Women should inform their health care provider if they wish to become pregnant or think they might be pregnant. Losing weight while pregnant is not advised and may cause harm to the unborn child. Talk to your health care provider for more information. What side effects may I notice from receiving this medicine? Side effects that you should report to your doctor or health care professional as soon as possible:  allergic reactions like skin rash, itching or hives, swelling of the face, lips, or tongue  breathing problems  changes in emotions or moods  changes in vision  chest pain or chest tightness  fast, irregular heartbeat  feeling faint or lightheaded  increased blood pressure  irritable  restlessness  tremors  seizures  signs and symptoms of a stroke like changes in vision; confusion; trouble speaking or understanding; severe headaches; sudden numbness or weakness of the face, arm or leg; trouble walking; dizziness; loss of balance or coordination  unusually weak or tired Side  effects that usually do not require medical attention (report to your doctor or health care professional if they continue or are bothersome):  changes in taste  constipation or diarrhea  dizziness  dry mouth  headache  trouble sleeping  upset stomach This list may not describe all possible side effects. Call your doctor for medical advice about side effects. You may report side effects to FDA at 1-800-FDA-1088. Where should I keep my medicine? Keep out of the reach of children. This medicine can be abused. Keep your medicine in a safe place to protect it from theft. Do not share this medicine with anyone. Selling or giving away this medicine is dangerous and against the law. This medicine may cause harm and death if it is taken by other adults, children, or pets. Return medicine that has not been used to an official disposal site. Contact the DEA at  (512) 245-6603 or your city/county government to find a site. If you cannot return the medicine, mix any unused medicine with a substance like cat litter or coffee grounds. Then throw the medicine away in a sealed container like a sealed bag or coffee can with a lid. Do not use the medicine after the expiration date. Store at room temperature between 20 and 25 degrees C (68 and 77 degrees F). Keep container tightly closed. NOTE: This sheet is a summary. It may not cover all possible information. If you have questions about this medicine, talk to your doctor, pharmacist, or health care provider.  2021 Elsevier/Gold Standard (2019-08-19 12:54:20)

## 2021-03-06 NOTE — Telephone Encounter (Signed)
Pt was seen in office to discuss medication and declined prior authorization.

## 2021-03-07 ENCOUNTER — Other Ambulatory Visit: Payer: Self-pay

## 2021-03-07 NOTE — Telephone Encounter (Signed)
Second Prior Authorization for Protonix has been submitted.

## 2021-03-12 ENCOUNTER — Other Ambulatory Visit: Payer: Self-pay

## 2021-03-12 NOTE — Telephone Encounter (Signed)
Noted we can check with her to see if she received the medication.

## 2021-03-13 ENCOUNTER — Other Ambulatory Visit: Payer: Self-pay

## 2021-03-13 NOTE — Telephone Encounter (Signed)
Received a fax stating that the patients pantoprazole has been approved.

## 2021-03-13 NOTE — Telephone Encounter (Signed)
Called and spoke to Draya. Zanaria was informed that the Pantoprazole Sodium 40mg  tablets have been approved. Reference Number [id number].

## 2021-03-18 ENCOUNTER — Telehealth (INDEPENDENT_AMBULATORY_CARE_PROVIDER_SITE_OTHER): Payer: BLUE CROSS/BLUE SHIELD | Admitting: Adult Health

## 2021-03-18 ENCOUNTER — Encounter: Payer: Self-pay | Admitting: Adult Health

## 2021-03-18 ENCOUNTER — Other Ambulatory Visit: Payer: Self-pay

## 2021-03-18 VITALS — Ht 64.02 in | Wt 168.0 lb

## 2021-03-18 DIAGNOSIS — B9689 Other specified bacterial agents as the cause of diseases classified elsewhere: Secondary | ICD-10-CM

## 2021-03-18 DIAGNOSIS — J40 Bronchitis, not specified as acute or chronic: Secondary | ICD-10-CM | POA: Diagnosis not present

## 2021-03-18 DIAGNOSIS — R059 Cough, unspecified: Secondary | ICD-10-CM

## 2021-03-18 DIAGNOSIS — J069 Acute upper respiratory infection, unspecified: Secondary | ICD-10-CM | POA: Diagnosis not present

## 2021-03-18 DIAGNOSIS — R062 Wheezing: Secondary | ICD-10-CM

## 2021-03-18 MED ORDER — AZITHROMYCIN 250 MG PO TABS
ORAL_TABLET | ORAL | 0 refills | Status: AC
  Filled 2021-03-18: qty 6, 5d supply, fill #0

## 2021-03-18 MED ORDER — BENZONATATE 200 MG PO CAPS
200.0000 mg | ORAL_CAPSULE | Freq: Two times a day (BID) | ORAL | 0 refills | Status: DC | PRN
  Filled 2021-03-18: qty 20, 10d supply, fill #0

## 2021-03-18 MED ORDER — ALBUTEROL SULFATE HFA 108 (90 BASE) MCG/ACT IN AERS
1.0000 | INHALATION_SPRAY | Freq: Four times a day (QID) | RESPIRATORY_TRACT | 1 refills | Status: DC | PRN
  Filled 2021-03-18: qty 8.5, 25d supply, fill #0

## 2021-03-18 MED ORDER — PREDNISONE 10 MG PO TABS
ORAL_TABLET | ORAL | 0 refills | Status: DC
  Filled 2021-03-18: qty 21, 6d supply, fill #0

## 2021-03-18 NOTE — Telephone Encounter (Signed)
Called patient to schedule for video visit with [facility]. Left message to call back.

## 2021-03-18 NOTE — Progress Notes (Signed)
Virtual Visit via Telephone Note  I connected with Kathleen Carr on 03/18/21 at  2:30 PM EDT by a video enabled telemedicine application and verified that I am speaking with the correct person using two identifiers. Parties involved in visit as below:   Location: Patient: at home Provider: Provider: Provider's office at  [facility], [place].      I discussed the limitations of evaluation and management by telemedicine and the availability of in person appointments. The patient expressed understanding and agreed to proceed.  History of Present Illness: [place] on telephone call only no viideo.  Onset on 03/11/2021.  Chest congestion. Covid test negative.  Significant other with same symptoms. Cough, fatigue, sinus and chest congestion, mild shortness of breath, denies fever or chills. ( see MY CHART message sent) Sinus pressure. Congestion in chest. NO dyspnea. NO distress. Patient  denies any fever, body aches,chills, rash, chest pain, shortness of breath, nausea, vomiting, or diarrhea.  Denies dizziness, lightheadedness, pre syncopal or syncopal episodes.    Observations/Objective: Telephone only no video.   Patient is alert and oriented and responsive to questions Engages in conversation with provider. Speaks in full sentences without any pauses without any shortness of breath or distress.  Assessment and Plan:   The primary encounter diagnosis was Bacterial upper respiratory infection. Diagnoses of Cough, Wheezing, and Bronchitis were also pertinent to this visit.   Meds ordered this encounter  Medications  . albuterol (PROAIR HFA) 108 (90 Base) MCG/ACT inhaler    Sig: Inhale 1-2 puffs into the lungs every 6 (six) hours as needed for wheezing.    Dispense:  8.5 g    Refill:  1  . azithromycin (ZITHROMAX) 250 MG tablet    Sig: Take 2 tablets on day 1, then 1 tablet daily on days 2 through 5    Dispense:  6 tablet    Refill:  0  . benzonatate  (TESSALON) 200 MG capsule    Sig: Take 1 capsule (200 mg total) by mouth 2 (two) times daily as needed for cough.    Dispense:  20 capsule    Refill:  0  . predniSONE (DELTASONE) 10 MG tablet    Sig: Take 6 tabs on day 1, 5 tabs on day 2, 4 tabs on day 3, 3 tabs on day 4, 2 tabs on day 5, 1 tab on day 6. Then stop.    Dispense:  21 tablet    Refill:  0   Continue Claritin daily.   Follow Up Instructions:   Red Flags discussed. The patient was given clear instructions to go to ER or return to medical center if any red flags develop, symptoms do not improve, worsen or new problems develop. They verbalized understanding.  Advised in person evaluation at anytime is advised if any symptoms do not improve, worsen or change at any given time.  Red Flags discussed. The patient was given clear instructions to go to ER or return to medical center if any red flags develop, symptoms do not improve, worsen or new problems develop. They verbalized understanding. Return if symptoms worsen or fail to improve, for at any time for any worsening symptoms, Go to Emergency room/ urgent care if worse.    Advised in person evaluation at anytime is advised if any symptoms do not improve, worsen or change at any given time.  Red Flags discussed. The patient was given clear instructions to go to ER or return to medical center if  any red flags develop, symptoms do not improve, worsen or new problems develop. They verbalized understanding. I discussed the assessment and treatment plan with the patient. The patient was provided an opportunity to ask questions and all were answered. The patient agreed with the plan and demonstrated an understanding of the instructions.   The patient was advised to call back or seek an in-person evaluation if the symptoms worsen or if the condition fails to improve as anticipated.  I provided 20 minutes of non-face-to-face time during this encounter.   Marcille Buffy,  FNP

## 2021-03-18 NOTE — Patient Instructions (Addendum)
Meds ordered this encounter  Medications  . albuterol (PROAIR HFA) 108 (90 Base) MCG/ACT inhaler    Sig: Inhale 1-2 puffs into the lungs every 6 (six) hours as needed for wheezing.    Dispense:  8 g    Refill:  1  . azithromycin (ZITHROMAX) 250 MG tablet    Sig: Take 2 tablets on day 1, then 1 tablet daily on days 2 through 5    Dispense:  6 tablet    Refill:  0  . benzonatate (TESSALON) 200 MG capsule    Sig: Take 1 capsule (200 mg total) by mouth 2 (two) times daily as needed for cough.    Dispense:  20 capsule    Refill:  0  . predniSONE (STERAPRED UNI-PAK 21 TAB) 10 MG (21) TBPK tablet    Sig: PO: Take 6 tablets on day 1:Take 5 tablets day 2:Take 4 tablets day 3: Take 3 tablets day 4:Take 2 tablets day five: 5 Take 1 tablet day 6    Dispense:  21 tablet    Refill:  0  Continue your allergy medication you are taking daily as well.  Advised in person evaluation at anytime is advised if any symptoms do not improve, worsen or change at any given time.  Red Flags discussed. The patient was given clear instructions to go to ER or return to medical center if any red flags develop, symptoms do not improve, worsen or new problems develop. They verbalized understanding.   Albuterol Tablets What is this medicine? ALBUTEROL (al [place]) is a bronchodilator. It treats bronchospasm. Bronchospasm is when you have trouble breathing and make loud or whistling sounds when you breathe. This drug opens the airways in the lungs so it is easier to breathe. Do not use this medicine to treat an acute asthma attack or bronchospasm. This medicine may be used for other purposes; ask your health care provider or pharmacist if you have questions. COMMON BRAND NAME(S): Proventil Repetabs What should I tell my health care provider before I take this medicine? They need to know if you have any of the following conditions:  diabetes (high blood sugar)  heart disease  high blood pressure  irregular  heartbeat or rhythm  pheochromocytoma  seizures  thyroid disease  an unusual or allergic reaction to albuterol, levalbuterol, sulfites, other medicines, foods, dyes, or preservatives  pregnant or trying to get pregnant  breast-feeding How should I use this medicine? Take this medicine by mouth. Take it as directed on the prescription label. You can take this medicine with or without food. If it upsets your stomach, take it with food. Keep taking it unless your health care provider tells you to stop. Talk to your health care provider about the use of this medicine in children. Special care may be needed. Overdosage: If you think you have taken too much of this medicine contact a poison control center or emergency room at once. NOTE: This medicine is only for you. Do not share this medicine with others. What if I miss a dose? If you miss a dose, take it as soon as you can. If it is almost time for your next dose, take only that dose. Do not take double or extra doses. What may interact with this medicine?  anti-infectives like chloroquine and pentamidine  caffeine  cisapride  diuretics  medicines for colds  medicines for depression or for emotional or psychotic conditions  medicines for weight loss including some herbal products  methadone  some antibiotics like clarithromycin, erythromycin, levofloxacin, and linezolid  some heart medicines  steroid hormones like dexamethasone, cortisone, hydrocortisone  theophylline  thyroid hormones This list may not describe all possible interactions. Give your health care provider a list of all the medicines, herbs, non-prescription drugs, or dietary supplements you use. Also tell them if you smoke, drink alcohol, or use illegal drugs. Some items may interact with your medicine. What should I watch for while using this medicine? Visit your health care provider for regular checks on your progress. Tell your health care provider if  your symptoms do not start to get better or if they get worse. Talk to your health care provider about how to treat an acute asthma attack or bronchospasm (wheezing). Be sure to always have a short-acting inhaler with you. If you use your short-acting inhaler and your symptoms do not get better or if they get worse, call your health care provider right away. You and your health care provider should develop an Asthma Action Plan that is just for you. Be sure to know what to do if you are in the yellow (asthma is getting worse) or red (medical alert) zones. Do not treat yourself for coughs, colds, or allergies without asking your health care provider for advice. Some of these medicines can affect this one. Your mouth may get dry. Chewing sugarless gum or sucking hard candy and drinking plenty of water may help. Contact your health care provider if the problem does not go away or is severe. What side effects may I notice from receiving this medicine? Side effects that you should report to your doctor or health care professional as soon as possible:  allergic reactions (skin rash, itching or hives; swelling of the face, lips, or tongue)  fever  heartbeat rhythm changes (trouble breathing; chest pain; dizziness; fast, irregular heartbeat; feeling faint or lightheaded, falls)  increase in blood pressure  muscle cramps, pain  muscle weakness  pain, tingling, numbness in the hands or feet  vomiting Side effects that usually do not require medical attention (report to your doctor or health care professional if they continue or are bothersome):  anxious  cough  diarrhea  headache  nasal congestion (runny or stuffy nose)  tremors  trouble sleeping  upset stomach This list may not describe all possible side effects. Call your doctor for medical advice about side effects. You may report side effects to FDA at 1-800-FDA-1088. Where should I keep my medicine? Keep out of the reach of  children and pets. Store at room temperature between 20 and 25 degrees C (68 and 77 degrees F). Protect from light. Keep the container tightly closed. Get rid of any unused medicine after the expiration date. To get rid of medicines that are no longer needed or expired:  Take the medicine to a medicine take-back program. Check with your pharmacy or law enforcement to find a location.  If you cannot return the medicine, check the label or package insert to see if the medicine should be thrown out in the garbage or flushed down the toilet. If you are not sure, ask your health care provider. If it is safe to put in the trash, empty the medicine out of the container. Mix the medicine with cat litter, dirt, coffee grounds, or other unwanted substance. Seal the mixture in a bag or container. Put it in the trash. NOTE: This sheet is a summary. It may not cover all possible information. If you have questions about this medicine,  talk to your doctor, pharmacist, or health care provider.  2021 Elsevier/Gold Standard (2020-09-08 15:09:45) Prednisolone tablets What is this medicine? PREDNISOLONE (pred NISS oh lone) is a corticosteroid. It is commonly used to treat inflammation of the skin, joints, lungs, and other organs. Common conditions treated include asthma, allergies, and arthritis. It is also used for other conditions, such as blood disorders and diseases of the adrenal glands. This medicine may be used for other purposes; ask your health care provider or pharmacist if you have questions. COMMON BRAND NAME(S): Millipred, Millipred DP, Millipred DP 12-Day, Millipred DP 6 Day, Prednoral What should I tell my health care provider before I take this medicine? They need to know if you have any of these conditions:  Cushing's syndrome  diabetes  glaucoma  heart problems or disease  high blood pressure  infection such as herpes, measles, tuberculosis, or chickenpox  kidney disease  liver  disease  mental problems  myasthenia gravis  osteoporosis  seizures  stomach ulcer or intestine disease including colitis and diverticulitis  thyroid problem  an unusual or allergic reaction to lactose, prednisolone, other medicines, foods, dyes, or preservatives  pregnant or trying to get pregnant  breast-feeding How should I use this medicine? Take this medicine by mouth with a glass of water. Follow the directions on the prescription label. Take it with food or milk to avoid stomach upset. If you are taking this medicine once a day, take it in the morning. Do not take more medicine than you are told to take. Do not suddenly stop taking your medicine because you may develop a severe reaction. Your doctor will tell you how much medicine to take. If your doctor wants you to stop the medicine, the dose may be slowly lowered over time to avoid any side effects. Talk to your pediatrician regarding the use of this medicine in children. Special care may be needed. Overdosage: If you think you have taken too much of this medicine contact a poison control center or emergency room at once. NOTE: This medicine is only for you. Do not share this medicine with others. What if I miss a dose? If you miss a dose, take it as soon as you can. If it is almost time for your next dose, take only that dose. Do not take double or extra doses. What may interact with this medicine? Do not take this medicine with any of the following medications:  metyrapone  mifepristone This medicine may also interact with the following medications:  aminoglutethimide  amphotericin B  aspirin and aspirin-like medicines  barbiturates  certain medicines for diabetes, like glipizide or glyburide  cholestyramine  cholinesterase inhibitors  cyclosporine  digoxin  diuretics  ephedrine  female hormones, like estrogens and birth control pills  isoniazid  ketoconazole  NSAIDS, medicines for pain and  inflammation, like ibuprofen or naproxen  phenytoin  rifampin  toxoids  vaccines  warfarin This list may not describe all possible interactions. Give your health care provider a list of all the medicines, herbs, non-prescription drugs, or dietary supplements you use. Also tell them if you smoke, drink alcohol, or use illegal drugs. Some items may interact with your medicine. What should I watch for while using this medicine? Visit your doctor or health care professional for regular checks on your progress. If you are taking this medicine over a prolonged period, carry an identification card with your name and address, the type and dose of your medicine, and your doctor's name and address. This  medicine may increase your risk of getting an infection. Tell your doctor or health care professional if you are around anyone with measles or chickenpox, or if you develop sores or blisters that do not heal properly. If you are going to have surgery, tell your doctor or health care professional that you have taken this medicine within the last twelve months. Ask your doctor or health care professional about your diet. You may need to lower the amount of salt you eat. This medicine may increase blood sugar. Ask your healthcare provider if changes in diet or medicines are needed if you have diabetes. What side effects may I notice from receiving this medicine? Side effects that you should report to your doctor or health care professional as soon as possible:  allergic reactions like skin rash, itching or hives, swelling of the face, lips, or tongue  changes in emotions or moods  eye pain   signs and symptoms of high blood sugar such as being more thirsty or hungry or having to urinate more than normal. You may also feel very tired or have blurry vision.  signs and symptoms of infection like fever or chills; cough; sore throat; pain or trouble passing urine  slow growth in children (if used for  longer periods of time)  swelling of ankles, feet  trouble sleeping  weak bones (if used for longer periods of time) Side effects that usually do not require medical attention (report to your doctor or health care professional if they continue or are bothersome):  nausea  skin problems, acne, thin and shiny skin  upset stomach  weight gain This list may not describe all possible side effects. Call your doctor for medical advice about side effects. You may report side effects to FDA at 1-800-FDA-1088. Where should I keep my medicine? Keep out of the reach of children. Store at room temperature between 15 and 30 degrees C (59 and 86 degrees F). Keep container tightly closed. Throw away any unused medicine after the expiration date. NOTE: This sheet is a summary. It may not cover all possible information. If you have questions about this medicine, talk to your doctor, pharmacist, or health care provider.  2021 Elsevier/Gold Standard (2018-07-15 10:30:56) Benzonatate capsules What is this medicine? BENZONATATE (ben ZOE na tate) is used to treat cough. This medicine may be used for other purposes; ask your health care provider or pharmacist if you have questions. COMMON BRAND NAME(S): Tessalon Perles, Zonatuss What should I tell my health care provider before I take this medicine? They need to know if you have any of these conditions:  kidney or liver disease  an unusual or allergic reaction to benzonatate, anesthetics, other medicines, foods, dyes, or preservatives  pregnant or trying to get pregnant  breast-feeding How should I use this medicine? Take this medicine by mouth with a glass of water. Follow the directions on the prescription label. Avoid breaking, chewing, or sucking the capsule, as this can cause serious side effects. Take your medicine at regular intervals. Do not take your medicine more often than directed. Talk to your pediatrician regarding the use of this  medicine in children. While this drug may be prescribed for children as young as 35 years old for selected conditions, precautions do apply. Overdosage: If you think you have taken too much of this medicine contact a poison control center or emergency room at once. NOTE: This medicine is only for you. Do not share this medicine with others. What if I miss  a dose? If you miss a dose, take it as soon as you can. If it is almost time for your next dose, take only that dose. Do not take double or extra doses. What may interact with this medicine? Do not take this medicine with any of the following medications:  MAOIs like Carbex, Eldepryl, Marplan, Nardil, and Parnate This list may not describe all possible interactions. Give your health care provider a list of all the medicines, herbs, non-prescription drugs, or dietary supplements you use. Also tell them if you smoke, drink alcohol, or use illegal drugs. Some items may interact with your medicine. What should I watch for while using this medicine? Tell your doctor if your symptoms do not improve or if they get worse. If you have a high fever, skin rash, or headache, see your health care professional. You may get drowsy or dizzy. Do not drive, use machinery, or do anything that needs mental alertness until you know how this medicine affects you. Do not sit or stand up quickly, especially if you are an older patient. This reduces the risk of dizzy or fainting spells. What side effects may I notice from receiving this medicine? Side effects that you should report to your doctor or health care professional as soon as possible:  allergic reactions like skin rash, itching or hives, swelling of the face, lips, or tongue  breathing problems  chest pain  confusion or hallucinations  irregular heartbeat  numbness of mouth or throat  seizures Side effects that usually do not require medical attention (report to your doctor or health care professional  if they continue or are bothersome):  burning feeling in the eyes  constipation  headache  nasal congestion  stomach upset This list may not describe all possible side effects. Call your doctor for medical advice about side effects. You may report side effects to FDA at 1-800-FDA-1088. Where should I keep my medicine? Keep out of the reach of children. Store at room temperature between 15 and 30 degrees C (59 and 86 degrees F). Keep tightly closed. Protect from light and moisture. Throw away any unused medicine after the expiration date. NOTE: This sheet is a summary. It may not cover all possible information. If you have questions about this medicine, talk to your doctor, pharmacist, or health care provider.  2021 Elsevier/Gold Standard (2008-01-12 14:52:56)  Cough, Adult A cough helps to clear your throat and lungs. A cough may be a sign of an illness or another medical condition. An acute cough may only last 2-3 weeks, while a chronic cough may last 8 or more weeks. Many things can cause a cough. They include:  Germs (viruses or bacteria) that attack the airway.  Breathing in things that bother (irritate) your lungs.  Allergies.  Asthma.  Mucus that runs down the back of your throat (postnasal drip).  Smoking.  Acid backing up from the stomach into the tube that moves food from the mouth to the stomach (gastroesophageal reflux).  Some medicines.  Lung problems.  Other medical conditions, such as heart failure or a blood clot in the lung (pulmonary embolism). Follow these instructions at home: Medicines  Take over-the-counter and prescription medicines only as told by your doctor.  Talk with your doctor before you take medicines that stop a cough (cough suppressants). Lifestyle  Do not smoke, and try not to be around smoke. Do not use any products that contain nicotine or tobacco, such as cigarettes, e-cigarettes, and chewing tobacco. If you need  help quitting,  ask your doctor.  Drink enough fluid to keep your pee (urine) pale yellow.  Avoid caffeine.  Do not drink alcohol if your doctor tells you not to drink.   General instructions  Watch for any changes in your cough. Tell your doctor about them.  Always cover your mouth when you cough.  Stay away from things that make you cough, such as perfume, candles, campfire smoke, or cleaning products.  If the air is dry, use a cool mist vaporizer or humidifier in your home.  If your cough is worse at night, try using extra pillows to raise your head up higher while you sleep.  Rest as needed.  Keep all follow-up visits as told by your doctor. This is important.   Contact a doctor if:  You have new symptoms.  You cough up pus.  Your cough does not get better after 2-3 weeks, or your cough gets worse.  Cough medicine does not help your cough and you are not sleeping well.  You have pain that gets worse or pain that is not helped with medicine.  You have a fever.  You are losing weight and you do not know why.  You have night sweats. Get help right away if:  You cough up blood.  You have trouble breathing.  Your heartbeat is very fast. These symptoms may be an emergency. Do not wait to see if the symptoms will go away. Get medical help right away. Call your local emergency services (911 in the [place] Do not drive yourself to the hospital. Summary  A cough helps to clear your throat and lungs. Many things can cause a cough.  Take over-the-counter and prescription medicines only as told by your doctor.  Always cover your mouth when you cough.  Contact a doctor if you have new symptoms or you have a cough that does not get better or gets worse. This information is not intended to replace advice given to you by your health care provider. Make sure you discuss any questions you have with your health care provider. Document Revised: 12/02/2019 Document Reviewed:  11/01/2018 Elsevier Patient Education  2021 [place]. Azithromycin tablets What is this medicine? AZITHROMYCIN (az ith roe MYE sin) is a macrolide antibiotic. It is used to treat or prevent certain kinds of bacterial infections. It will not work for colds, flu, or other viral infections. This medicine may be used for other purposes; ask your health care provider or pharmacist if you have questions. COMMON BRAND NAME(S): Zithromax, Zithromax Tri-Pak, Zithromax Z-Pak What should I tell my health care provider before I take this medicine? They need to know if you have any of these conditions:  history of blood diseases, like leukemia  history of irregular heartbeat  kidney disease  liver disease  myasthenia gravis  an unusual or allergic reaction to azithromycin, erythromycin, other macrolide antibiotics, foods, dyes, or preservatives  pregnant or trying to get pregnant  breast-feeding How should I use this medicine? Take this medicine by mouth with a full glass of water. Follow the directions on the prescription label. The tablets can be taken with food or on an empty stomach. If the medicine upsets your stomach, take it with food. Take your medicine at regular intervals. Do not take your medicine more often than directed. Take all of your medicine as directed even if you think your are better. Do not skip doses or stop your medicine early. Talk to your pediatrician regarding the use of  this medicine in children. While this drug may be prescribed for children as young as 6 months for selected conditions, precautions do apply. Overdosage: If you think you have taken too much of this medicine contact a poison control center or emergency room at once. NOTE: This medicine is only for you. Do not share this medicine with others. What if I miss a dose? If you miss a dose, take it as soon as you can. If it is almost time for your next dose, take only that dose. Do not take double or extra  doses. What may interact with this medicine? Do not take this medicine with any of the following medications:  cisapride  dronedarone  pimozide  thioridazine This medicine may also interact with the following medications:  antacids that contain aluminum or magnesium  birth control pills  colchicine  cyclosporine  digoxin  ergot alkaloids like dihydroergotamine, ergotamine  nelfinavir  other medicines that prolong the QT interval (an abnormal heart rhythm)  phenytoin  warfarin This list may not describe all possible interactions. Give your health care provider a list of all the medicines, herbs, non-prescription drugs, or dietary supplements you use. Also tell them if you smoke, drink alcohol, or use illegal drugs. Some items may interact with your medicine. What should I watch for while using this medicine? Tell your doctor or healthcare provider if your symptoms do not start to get better or if they get worse. This medicine may cause serious skin reactions. They can happen weeks to months after starting the medicine. Contact your healthcare provider right away if you notice fevers or flu-like symptoms with a rash. The rash may be red or purple and then turn into blisters or peeling of the skin. Or, you might notice a red rash with swelling of the face, lips or lymph nodes in your neck or under your arms. Do not treat diarrhea with over the counter products. Contact your doctor if you have diarrhea that lasts more than 2 days or if it is severe and watery. This medicine can make you more sensitive to the sun. Keep out of the sun. If you cannot avoid being in the sun, wear protective clothing and use sunscreen. Do not use sun lamps or tanning beds/booths. What side effects may I notice from receiving this medicine? Side effects that you should report to your doctor or health care professional as soon as possible:  allergic reactions like skin rash, itching or hives, swelling  of the face, lips, or tongue  bloody or watery diarrhea  breathing problems  chest pain  fast, irregular heartbeat  muscle weakness  rash, fever, and swollen lymph nodes  redness, blistering, peeling, or loosening of the skin, including inside the mouth  signs and symptoms of liver injury like dark yellow or brown urine; general ill feeling or flu-like symptoms; light-colored stools; loss of appetite; nausea; right upper belly pain; unusually weak or tired; yellowing of the eyes or skin  white patches or sores in the mouth  unusually weak or tired Side effects that usually do not require medical attention (report to your doctor or health care professional if they continue or are bothersome):  diarrhea  nausea  stomach pain  vomiting This list may not describe all possible side effects. Call your doctor for medical advice about side effects. You may report side effects to FDA at 1-800-FDA-1088. Where should I keep my medicine? Keep out of the reach of children. Store at room temperature between 15 and  30 degrees C (59 and 86 degrees F). Throw away any unused medicine after the expiration date. NOTE: This sheet is a summary. It may not cover all possible information. If you have questions about this medicine, talk to your doctor, pharmacist, or health care provider.  2021 Elsevier/Gold Standard (2019-01-20 17:19:20) Acute Bronchitis, Adult  Acute bronchitis is when air tubes in the lungs (bronchi) suddenly get swollen. The condition can make it hard for you to breathe. In adults, acute bronchitis usually goes away within 2 weeks. A cough caused by bronchitis may last up to 3 weeks. Smoking, allergies, and asthma can make the condition worse. What are the causes? This condition is caused by:  Cold and flu viruses. The most common cause of this condition is the virus that causes the common cold.  Bacteria.  Substances that irritate the lungs, including: ? Smoke from  cigarettes and other types of tobacco. ? Dust and pollen. ? Fumes from chemicals, gases, or burned fuel. ? Other materials that pollute indoor or outdoor air.  Close contact with someone who has acute bronchitis. What increases the risk? The following factors may make you more likely to develop this condition:  A weak body's defense system. This is also called the immune system.  Any condition that affects your lungs and breathing, such as asthma. What are the signs or symptoms? Symptoms of this condition include:  A cough.  Coughing up clear, yellow, or green mucus.  Wheezing.  Chest congestion.  Shortness of breath.  A fever.  Body aches.  Chills.  A sore throat. How is this treated? Acute bronchitis may go away over time without treatment. Your doctor may recommend:  Drinking more fluids.  Taking a medicine for a fever or cough.  Using a device that gets medicine into your lungs (inhaler).  Using a vaporizer or a humidifier. These are machines that add water or moisture in the air to help with coughing and poor breathing. Follow these instructions at home: Activity  Get a lot of rest.  Avoid places where there are fumes from chemicals.  Return to your normal activities as told by your doctor. Ask your doctor what activities are safe for you. Lifestyle  Drink enough fluids to keep your pee (urine) pale yellow.  Do not drink alcohol.  Do not use any products that contain nicotine or tobacco, such as cigarettes, e-cigarettes, and chewing tobacco. If you need help quitting, ask your doctor. Be aware that: ? Your bronchitis will get worse if you smoke or breathe in other people's smoke (secondhand smoke). ? Your lungs will heal faster if you quit smoking. General instructions  Take over-the-counter and prescription medicines only as told by your doctor.  Use an inhaler, cool mist vaporizer, or humidifier as told by your doctor.  Rinse your mouth often  with salt water. To make salt water, dissolve -1 tsp (3-6 g) of salt in 1 cup (237 mL) of warm water.  Keep all follow-up visits as told by your doctor. This is important.   How is this prevented? To lower your risk of getting this condition again:  Wash your hands often with soap and water. If soap and water are not available, use hand sanitizer.  Avoid contact with people who have cold symptoms.  Try not to touch your mouth, nose, or eyes with your hands.  Make sure to get the flu shot every year.   Contact a doctor if:  Your symptoms do not get better in  2 weeks.  You vomit more than once or twice.  You have symptoms of loss of fluid from your body (dehydration). These include: ? Dark urine. ? Dry skin or eyes. ? Increased thirst. ? Headaches. ? Confusion. ? Muscle cramps. Get help right away if:  You cough up blood.  You have chest pain.  You have very bad shortness of breath.  You become dehydrated.  You faint or keep feeling like you are going to faint.  You keep vomiting.  You have a very bad headache.  Your fever or chills get worse. These symptoms may be an emergency. Do not wait to see if the symptoms will go away. Get medical help right away. Call your local emergency services (911 in the [place] Do not drive yourself to the hospital. Summary  Acute bronchitis is when air tubes in the lungs (bronchi) suddenly get swollen. In adults, acute bronchitis usually goes away within 2 weeks.  Take over-the-counter and prescription medicines only as told by your doctor.  Drink enough fluid to keep your pee (urine) pale yellow.  Contact a doctor if your symptoms do not improve after 2 weeks of treatment.  Get help right away if you cough up blood, faint, or have chest pain or shortness of breath. This information is not intended to replace advice given to you by your health care provider. Make sure you discuss any questions you have with your health care  provider. Document Revised: 05/06/2019 Document Reviewed: 05/06/2019 Elsevier Patient Education  [place].

## 2021-03-19 ENCOUNTER — Other Ambulatory Visit: Payer: Self-pay

## 2021-03-31 ENCOUNTER — Other Ambulatory Visit: Payer: Self-pay | Admitting: Adult Health

## 2021-03-31 DIAGNOSIS — G4700 Insomnia, unspecified: Secondary | ICD-10-CM

## 2021-04-01 ENCOUNTER — Other Ambulatory Visit: Payer: Self-pay

## 2021-04-01 ENCOUNTER — Other Ambulatory Visit: Payer: Self-pay | Admitting: Adult Health

## 2021-04-01 DIAGNOSIS — G4700 Insomnia, unspecified: Secondary | ICD-10-CM

## 2021-04-03 ENCOUNTER — Other Ambulatory Visit: Payer: Self-pay

## 2021-04-04 ENCOUNTER — Other Ambulatory Visit: Payer: Self-pay

## 2021-04-04 ENCOUNTER — Encounter: Payer: Self-pay | Admitting: Adult Health

## 2021-04-04 ENCOUNTER — Telehealth (INDEPENDENT_AMBULATORY_CARE_PROVIDER_SITE_OTHER): Payer: BLUE CROSS/BLUE SHIELD | Admitting: Adult Health

## 2021-04-04 VITALS — BP 108/68 | Wt 170.0 lb

## 2021-04-04 DIAGNOSIS — Z713 Dietary counseling and surveillance: Secondary | ICD-10-CM | POA: Diagnosis not present

## 2021-04-04 MED ORDER — PHENTERMINE HCL 37.5 MG PO CAPS
37.5000 mg | ORAL_CAPSULE | ORAL | 0 refills | Status: DC
  Filled 2021-04-04: qty 30, 30d supply, fill #0

## 2021-04-04 MED FILL — Zolpidem Tartrate Tab 5 MG: ORAL | 30 days supply | Qty: 30 | Fill #0 | Status: AC

## 2021-04-04 NOTE — Progress Notes (Addendum)
Virtual Visit via Video Note  I connected with Kathleen Carr on 04/04/21 at 10:00 AM EDT by a video enabled telemedicine application and verified that I am speaking with the correct person using two identifiers. Parties involved in visit as below:    Location: Patient: at home  Provider: Provider: Provider's office at  [facility], [place].      I discussed the limitations of evaluation and management by telemedicine and the availability of in person appointments. The patient expressed understanding and agreed to proceed.  History of Present Illness: Patient is here by virtual video visit for a follow up on weight loss, she started Phentermine 37.5 mg  on 03/06/21 at patients request.  She is doing well on medication. She reports weight is 170 lbs on home scale.    Patient  denies any fever, body aches,chills, rash, chest pain, shortness of breath, nausea, vomiting, or diarrhea.   Blood pressure has been normal at home.   Patient  denies any fever, body aches,chills, rash, chest pain, shortness of breath, nausea, vomiting, or diarrhea.   She reports she has recovered completely from the bacterial upper respiratory infection she was treated for on 03/18/21.   Observations/Objective:   Patient is alert and oriented and responsive to questions Engages in conversation with provider. Speaks in full sentences without any pauses without any shortness of breath or distress.    Assessment and Plan:  1. Weight loss counseling, encounter for Doing well, lost a couple of pounds. Will continue diet and exercise/ lifestyle changed. Plan is phentermine for 3 months to help with weight loss jump start.  - phentermine 37.5 MG capsule; Take 1 capsule (37.5 mg total) by mouth every morning.  Dispense: 30 capsule; Refill: 0   Return in about 1 month (around 05/04/2021), or if symptoms worsen or fail to improve, for at any time for any worsening symptoms, Go to Emergency  room/ urgent care if worse.  Follow Up Instructions:   Advised in person evaluation at anytime is advised if any symptoms do not improve, worsen or change at any given time.  Red Flags discussed. The patient was given clear instructions to go to ER or return to medical center if any red flags develop, symptoms do not improve, worsen or new problems develop. They verbalized understanding.  I discussed the assessment and treatment plan with the patient. The patient was provided an opportunity to ask questions and all were answered. The patient agreed with the plan and demonstrated an understanding of the instructions.   The patient was advised to call back or seek an in-person evaluation if the symptoms worsen or if the condition fails to improve as anticipated.   Marcille Buffy, FNP

## 2021-04-04 NOTE — Patient Instructions (Signed)
Calorie Counting for Weight Loss Calories are units of energy. Your body needs a certain number of calories from food to keep going throughout the day. When you eat or drink more calories than your body needs, your body stores the extra calories mostly as fat. When you eat or drink fewer calories than your body needs, your body burns fat to get the energy it needs. Calorie counting means keeping track of how many calories you eat and drink each day. Calorie counting can be helpful if you need to lose weight. If you eat fewer calories than your body needs, you should lose weight. Ask your health care provider what a healthy weight is for you. For calorie counting to work, you will need to eat the right number of calories each day to lose a healthy amount of weight per week. A dietitian can help you figure out how many calories you need in a day and will suggest ways to reach your calorie goal. A healthy amount of weight to lose each week is usually 1-2 lb (0.5-0.9 kg). This usually means that your daily calorie intake should be reduced by 500-750 calories. Eating 1,200-1,500 calories a day can help most women lose weight. Eating 1,500-1,800 calories a day can help most men lose weight. What do I need to know about calorie counting? Work with your health care provider or dietitian to determine how many calories you should get each day. To meet your daily calorie goal, you will need to: Find out how many calories are in each food that you would like to eat. Try to do this before you eat. Decide how much of the food you plan to eat. Keep a food log. Do this by writing down what you ate and how many calories it had. To successfully lose weight, it is important to balance calorie counting with a healthy lifestyle that includes regular activity. Where do I find calorie information? The number of calories in a food can be found on a Nutrition Facts label. If a food does not have a Nutrition Facts label, try to  look up the calories online or ask your dietitian for help. Remember that calories are listed per serving. If you choose to have more than one serving of a food, you will have to multiply the calories per serving by the number of servings you plan to eat. For example, the label on a package of bread might say that a serving size is 1 slice and that there are 90 calories in a serving. If you eat 1 slice, you will have eaten 90 calories. If you eat 2 slices, you will have eaten 180 calories.   How do I keep a food log? After each time that you eat, record the following in your food log as soon as possible: What you ate. Be sure to include toppings, sauces, and other extras on the food. How much you ate. This can be measured in cups, ounces, or number of items. How many calories were in each food and drink. The total number of calories in the food you ate. Keep your food log near you, such as in a pocket-sized notebook or on an app or website on your mobile phone. Some programs will calculate calories for you and show you how many calories you have left to meet your daily goal. What are some portion-control tips? Know how many calories are in a serving. This will help you know how many servings you can have of a  certain food. Use a measuring cup to measure serving sizes. You could also try weighing out portions on a kitchen scale. With time, you will be able to estimate serving sizes for some foods. Take time to put servings of different foods on your favorite plates or in your favorite bowls and cups so you know what a serving looks like. Try not to eat straight from a food's packaging, such as from a bag or box. Eating straight from the package makes it hard to see how much you are eating and can lead to overeating. Put the amount you would like to eat in a cup or on a plate to make sure you are eating the right portion. Use smaller plates, glasses, and bowls for smaller portions and to prevent  overeating. Try not to multitask. For example, avoid watching TV or using your computer while eating. If it is time to eat, sit down at a table and enjoy your food. This will help you recognize when you are full. It will also help you be more mindful of what and how much you are eating. What are tips for following this plan? Reading food labels Check the calorie count compared with the serving size. The serving size may be smaller than what you are used to eating. Check the source of the calories. Try to choose foods that are high in protein, fiber, and vitamins, and low in saturated fat, trans fat, and sodium. Shopping Read nutrition labels while you shop. This will help you make healthy decisions about which foods to buy. Pay attention to nutrition labels for low-fat or fat-free foods. These foods sometimes have the same number of calories or more calories than the full-fat versions. They also often have added sugar, starch, or salt to make up for flavor that was removed with the fat. Make a grocery list of lower-calorie foods and stick to it. Cooking Try to cook your favorite foods in a healthier way. For example, try baking instead of frying. Use low-fat dairy products. Meal planning Use more fruits and vegetables. One-half of your plate should be fruits and vegetables. Include lean proteins, such as chicken, [place], and fish. Lifestyle Each week, aim to do one of the following: 150 minutes of moderate exercise, such as walking. 75 minutes of vigorous exercise, such as running. General information Know how many calories are in the foods you eat most often. This will help you calculate calorie counts faster. Find a way of tracking calories that works for you. Get creative. Try different apps or programs if writing down calories does not work for you. What foods should I eat? Eat nutritious foods. It is better to have a nutritious, high-calorie food, such as an avocado, than a food with few  nutrients, such as a bag of potato chips. Use your calories on foods and drinks that will fill you up and will not leave you hungry soon after eating. Examples of foods that fill you up are nuts and nut butters, vegetables, lean proteins, and high-fiber foods such as whole grains. High-fiber foods are foods with more than 5 g of fiber per serving. Pay attention to calories in drinks. Low-calorie drinks include water and unsweetened drinks. The items listed above may not be a complete list of foods and beverages you can eat. Contact a dietitian for more information.   What foods should I limit? Limit foods or drinks that are not good sources of vitamins, minerals, or protein or that are high in unhealthy  fats. These include: Candy. Other sweets. Sodas, specialty coffee drinks, alcohol, and juice. The items listed above may not be a complete list of foods and beverages you should avoid. Contact a dietitian for more information. How do I count calories when eating out? Pay attention to portions. Often, portions are much larger when eating out. Try these tips to keep portions smaller: Consider sharing a meal instead of getting your own. If you get your own meal, eat only half of it. Before you start eating, ask for a container and put half of your meal into it. When available, consider ordering smaller portions from the menu instead of full portions. Pay attention to your food and drink choices. Knowing the way food is cooked and what is included with the meal can help you eat fewer calories. If calories are listed on the menu, choose the lower-calorie options. Choose dishes that include vegetables, fruits, whole grains, low-fat dairy products, and lean proteins. Choose items that are boiled, broiled, grilled, or steamed. Avoid items that are buttered, battered, fried, or served with cream sauce. Items labeled as crispy are usually fried, unless stated otherwise. Choose water, low-fat milk,  unsweetened iced tea, or other drinks without added sugar. If you want an alcoholic beverage, choose a lower-calorie option, such as a glass of wine or light beer. Ask for dressings, sauces, and syrups on the side. These are usually high in calories, so you should limit the amount you eat. If you want a salad, choose a garden salad and ask for grilled meats. Avoid extra toppings such as bacon, cheese, or fried items. Ask for the dressing on the side, or ask for olive oil and vinegar or lemon to use as dressing. Estimate how many servings of a food you are given. Knowing serving sizes will help you be aware of how much food you are eating at restaurants. Where to find more information Centers for Disease Control and Prevention: http://www.wolf.info/ [place]. Department of Agriculture: http://www.wilson-mendoza.org/ Summary Calorie counting means keeping track of how many calories you eat and drink each day. If you eat fewer calories than your body needs, you should lose weight. A healthy amount of weight to lose per week is usually 1-2 lb (0.5-0.9 kg). This usually means reducing your daily calorie intake by 500-750 calories. The number of calories in a food can be found on a Nutrition Facts label. If a food does not have a Nutrition Facts label, try to look up the calories online or ask your dietitian for help. Use smaller plates, glasses, and bowls for smaller portions and to prevent overeating. Use your calories on foods and drinks that will fill you up and not leave you hungry shortly after a meal. This information is not intended to replace advice given to you by your health care provider. Make sure you discuss any questions you have with your health care provider. Document Revised: 11/24/2019 Document Reviewed: 11/24/2019 Elsevier Patient Education  [place]. Phentermine tablets or capsules What is this medicine? PHENTERMINE (FEN ter meen) decreases your appetite. It is used with a reduced calorie diet and exercise  to help you lose weight. This medicine may be used for other purposes; ask your health care provider or pharmacist if you have questions. COMMON BRAND NAME(S): Adipex-P, Atti-Plex P, Atti-Plex P Spansule, Fastin, Lomaira, Pro-Fast, Tara-8 What should I tell my health care provider before I take this medicine? They need to know if you have any of these conditions: agitation or nervousness diabetes glaucoma  heart disease high blood pressure history of drug abuse or addiction history of stroke kidney disease lung disease called Primary Pulmonary Hypertension (PPH) taken an MAOI like Carbex, Eldepryl, Marplan, Nardil, or Parnate in last 14 days taking stimulant medicines for attention disorders, weight loss, or to stay awake thyroid disease an unusual or allergic reaction to phentermine, other medicines, foods, dyes, or preservatives pregnant or trying to get pregnant breast-feeding How should I use this medicine? Take this medicine by mouth with a glass of water. Follow the directions on the prescription label. Take your medicine at regular intervals. Do not take it more often than directed. Do not stop taking except on your doctor's advice. Talk to your pediatrician regarding the use of this medicine in children. While this drug may be prescribed for children 17 years or older for selected conditions, precautions do apply. Overdosage: If you think you have taken too much of this medicine contact a poison control center or emergency room at once. NOTE: This medicine is only for you. Do not share this medicine with others. What if I miss a dose? If you miss a dose, take it as soon as you can. If it is almost time for your next dose, take only that dose. Do not take double or extra doses. What may interact with this medicine? Do not take this medicine with any of the following medications: MAOIs like Carbex, Eldepryl, Marplan, Nardil, and Parnate This medicine may also interact with the  following medications: alcohol certain medicines for depression, anxiety, or psychotic disorders certain medicines for high blood pressure linezolid medicines for colds or breathing difficulties like pseudoephedrine or phenylephrine medicines for diabetes sibutramine stimulant medicines for attention disorders, weight loss, or to stay awake This list may not describe all possible interactions. Give your health care provider a list of all the medicines, herbs, non-prescription drugs, or dietary supplements you use. Also tell them if you smoke, drink alcohol, or use illegal drugs. Some items may interact with your medicine. What should I watch for while using this medicine? Visit your doctor or health care provider for regular checks on your progress. Do not stop taking except on your health care provider's advice. You may develop a severe reaction. Your health care provider will tell you how much medicine to take. Do not take this medicine close to bedtime. It may prevent you from sleeping. You may get drowsy or dizzy. Do not drive, use machinery, or do anything that needs mental alertness until you know how this medicine affects you. Do not stand or sit up quickly, especially if you are an older patient. This reduces the risk of dizzy or fainting spells. Alcohol may increase dizziness and drowsiness. Avoid alcoholic drinks. This medicine may affect blood sugar levels. Ask your healthcare provider if changes in diet or medicines are needed if you have diabetes. Women should inform their health care provider if they wish to become pregnant or think they might be pregnant. Losing weight while pregnant is not advised and may cause harm to the unborn child. Talk to your health care provider for more information. What side effects may I notice from receiving this medicine? Side effects that you should report to your doctor or health care professional as soon as possible: allergic reactions like skin  rash, itching or hives, swelling of the face, lips, or tongue breathing problems changes in emotions or moods changes in vision chest pain or chest tightness fast, irregular heartbeat feeling faint or lightheaded increased blood pressure  irritable restlessness tremors seizures signs and symptoms of a stroke like changes in vision; confusion; trouble speaking or understanding; severe headaches; sudden numbness or weakness of the face, arm or leg; trouble walking; dizziness; loss of balance or coordination unusually weak or tired Side effects that usually do not require medical attention (report to your doctor or health care professional if they continue or are bothersome): changes in taste constipation or diarrhea dizziness dry mouth headache trouble sleeping upset stomach This list may not describe all possible side effects. Call your doctor for medical advice about side effects. You may report side effects to FDA at 1-800-FDA-1088. Where should I keep my medicine? Keep out of the reach of children. This medicine can be abused. Keep your medicine in a safe place to protect it from theft. Do not share this medicine with anyone. Selling or giving away this medicine is dangerous and against the law. This medicine may cause harm and death if it is taken by other adults, children, or pets. Return medicine that has not been used to an official disposal site. Contact the DEA at 346-052-6022 or your city/county government to find a site. If you cannot return the medicine, mix any unused medicine with a substance like cat litter or coffee grounds. Then throw the medicine away in a sealed container like a sealed bag or coffee can with a lid. Do not use the medicine after the expiration date. Store at room temperature between 20 and 25 degrees C (68 and 77 degrees F). Keep container tightly closed. NOTE: This sheet is a summary. It may not cover all possible information. If you have questions about  this medicine, talk to your doctor, pharmacist, or health care provider.  2021 Elsevier/Gold Standard (2019-08-19 12:54:20)

## 2021-04-10 ENCOUNTER — Other Ambulatory Visit: Payer: Self-pay | Admitting: Adult Health

## 2021-04-10 ENCOUNTER — Other Ambulatory Visit: Payer: Self-pay

## 2021-04-16 ENCOUNTER — Other Ambulatory Visit: Payer: Self-pay

## 2021-04-18 ENCOUNTER — Other Ambulatory Visit: Payer: Self-pay

## 2021-05-08 ENCOUNTER — Other Ambulatory Visit: Payer: Self-pay

## 2021-05-08 ENCOUNTER — Other Ambulatory Visit: Payer: Self-pay | Admitting: Adult Health

## 2021-05-08 ENCOUNTER — Telehealth: Payer: Self-pay | Admitting: Adult Health

## 2021-05-08 DIAGNOSIS — G4700 Insomnia, unspecified: Secondary | ICD-10-CM

## 2021-05-08 DIAGNOSIS — Z713 Dietary counseling and surveillance: Secondary | ICD-10-CM

## 2021-05-08 DIAGNOSIS — F319 Bipolar disorder, unspecified: Secondary | ICD-10-CM

## 2021-05-09 ENCOUNTER — Other Ambulatory Visit: Payer: Self-pay

## 2021-05-09 NOTE — Telephone Encounter (Signed)
PT needs a refill of:  metFORMIN (GLUCOPHAGE) 1000 MG tablet QUEtiapine (SEROQUEL) 300 MG tablet sertraline (ZOLOFT) 100 MG tablet zolpidem (AMBIEN) 5 MG tablet phentermine 37.5 MG capsule

## 2021-05-10 ENCOUNTER — Other Ambulatory Visit: Payer: Self-pay | Admitting: Family

## 2021-05-10 ENCOUNTER — Other Ambulatory Visit: Payer: Self-pay

## 2021-05-10 DIAGNOSIS — F319 Bipolar disorder, unspecified: Secondary | ICD-10-CM

## 2021-05-10 DIAGNOSIS — Z713 Dietary counseling and surveillance: Secondary | ICD-10-CM

## 2021-05-10 DIAGNOSIS — G4700 Insomnia, unspecified: Secondary | ICD-10-CM

## 2021-05-10 MED ORDER — PHENTERMINE HCL 37.5 MG PO CAPS
37.5000 mg | ORAL_CAPSULE | ORAL | 0 refills | Status: DC
  Filled 2021-05-10: qty 30, 30d supply, fill #0

## 2021-05-10 MED ORDER — METFORMIN HCL 1000 MG PO TABS
ORAL_TABLET | Freq: Two times a day (BID) | ORAL | 1 refills | Status: DC
  Filled 2021-05-10: qty 180, 90d supply, fill #0
  Filled 2021-08-11: qty 180, 90d supply, fill #1

## 2021-05-10 MED ORDER — SERTRALINE HCL 100 MG PO TABS
ORAL_TABLET | Freq: Every day | ORAL | 0 refills | Status: DC
  Filled 2021-05-10: qty 90, 90d supply, fill #0

## 2021-05-10 MED ORDER — ZOLPIDEM TARTRATE 5 MG PO TABS
ORAL_TABLET | ORAL | 0 refills | Status: DC
Start: 2021-05-10 — End: 2021-06-19
  Filled 2021-05-10: qty 30, 30d supply, fill #0

## 2021-05-10 MED ORDER — QUETIAPINE FUMARATE 300 MG PO TABS
ORAL_TABLET | ORAL | 1 refills | Status: DC
  Filled 2021-05-10: qty 90, 90d supply, fill #0
  Filled 2021-08-11: qty 30, 30d supply, fill #1
  Filled 2021-09-11: qty 30, 30d supply, fill #2
  Filled 2021-10-14: qty 30, 30d supply, fill #3

## 2021-05-10 NOTE — Telephone Encounter (Signed)
Spoke with pt to let her know that her medication has been refilled.

## 2021-05-20 ENCOUNTER — Other Ambulatory Visit: Payer: Self-pay

## 2021-06-18 ENCOUNTER — Telehealth: Payer: Self-pay | Admitting: Adult Health

## 2021-06-18 DIAGNOSIS — G4700 Insomnia, unspecified: Secondary | ICD-10-CM

## 2021-06-18 NOTE — Telephone Encounter (Signed)
PT called to request a refill of their following meds to be sent to the [facility] for refills:  zolpidem (AMBIEN) 5 MG tablet  phentermine 37.5 MG capsule

## 2021-06-18 NOTE — Telephone Encounter (Signed)
LS: 04-04-21 LO:05-10-21

## 2021-06-19 ENCOUNTER — Other Ambulatory Visit: Payer: Self-pay

## 2021-06-19 MED ORDER — ZOLPIDEM TARTRATE 5 MG PO TABS
ORAL_TABLET | ORAL | 0 refills | Status: DC
  Filled 2021-06-19: qty 30, 30d supply, fill #0

## 2021-06-19 NOTE — Telephone Encounter (Signed)
Patient notified of the below. She has appointment with PCP already.

## 2021-06-19 NOTE — Addendum Note (Signed)
Addended by: Alisa Graff on: 06/19/2021 01:30 AM   Modules accepted: Orders

## 2021-06-19 NOTE — Telephone Encounter (Signed)
I have refilled the ambien #30 with no refills.  Regarding the phentermine, per review of [name]'s note, she was going to give Kathleen Carr phentermine for three months to help with weight loss.  She has completed 3 months.  Can schedule f/u appt.

## 2021-07-18 ENCOUNTER — Telehealth: Payer: Self-pay | Admitting: Adult Health

## 2021-07-18 ENCOUNTER — Other Ambulatory Visit: Payer: Self-pay | Admitting: Internal Medicine

## 2021-07-18 DIAGNOSIS — G4700 Insomnia, unspecified: Secondary | ICD-10-CM

## 2021-07-18 NOTE — Telephone Encounter (Signed)
Patient had to be rescheduled do to her provider being out. Patient is leaving on 07/22/21 in the morning to go out of town and will be out of town for [employer] because of father's surgery. Patient needs refills on her medications. There are not office appointments.

## 2021-07-18 NOTE — Telephone Encounter (Signed)
Cancel request, patient has an appointment.

## 2021-07-19 ENCOUNTER — Other Ambulatory Visit: Payer: Self-pay

## 2021-07-19 ENCOUNTER — Encounter: Payer: Self-pay | Admitting: Internal Medicine

## 2021-07-19 ENCOUNTER — Other Ambulatory Visit: Payer: Self-pay | Admitting: Internal Medicine

## 2021-07-19 ENCOUNTER — Ambulatory Visit (INDEPENDENT_AMBULATORY_CARE_PROVIDER_SITE_OTHER): Payer: Self-pay | Admitting: Internal Medicine

## 2021-07-19 VITALS — BP 110/72 | HR 73 | Temp 98.2°F | Ht 64.0 in | Wt 171.8 lb

## 2021-07-19 DIAGNOSIS — Z23 Encounter for immunization: Secondary | ICD-10-CM

## 2021-07-19 DIAGNOSIS — D72825 Bandemia: Secondary | ICD-10-CM

## 2021-07-19 DIAGNOSIS — G4700 Insomnia, unspecified: Secondary | ICD-10-CM

## 2021-07-19 DIAGNOSIS — E785 Hyperlipidemia, unspecified: Secondary | ICD-10-CM

## 2021-07-19 DIAGNOSIS — E118 Type 2 diabetes mellitus with unspecified complications: Secondary | ICD-10-CM

## 2021-07-19 DIAGNOSIS — E663 Overweight: Secondary | ICD-10-CM

## 2021-07-19 DIAGNOSIS — F3170 Bipolar disorder, currently in remission, most recent episode unspecified: Secondary | ICD-10-CM

## 2021-07-19 DIAGNOSIS — E1169 Type 2 diabetes mellitus with other specified complication: Secondary | ICD-10-CM

## 2021-07-19 MED FILL — Zolpidem Tartrate Tab 5 MG: ORAL | 30 days supply | Qty: 30 | Fill #0 | Status: AC

## 2021-07-19 NOTE — Patient Instructions (Signed)
I am  recommending thatyou consider  adding Ozempic  to help you lose weight  and manage your diabetes.  ozempic is a medication that is taken as a weekly subcutaneous injection. It is not insulin.  It  causes your pancreas to increase its  own insulin secretion  And also slows down the emptying of your stomach,  So it decreases your appetite and helps you lose weight.

## 2021-07-19 NOTE — Telephone Encounter (Signed)
This patient has an appointment with Dr. Derrel Nip this afternoon to discuss medication refills.  I will defer this to that visit.

## 2021-07-19 NOTE — Progress Notes (Signed)
Subjective:  Patient ID: Kathleen Carr, female    DOB: [birth year]  Age: 50 y.o. MRN: [id number]  CC: The primary encounter diagnosis was Hyperlipidemia associated with type 2 diabetes mellitus ([place]). Diagnoses of Bandemia, Need for immunization against influenza, Overweight (BMI 25.0-29.9), Insomnia, unspecified type, Bipolar disorder, currently in remission ([place]), and Controlled type 2 diabetes mellitus with complication, without long-term current use of insulin ([place]) were also pertinent to this visit.  HPI Michille D Cortina presents for follow up on multiple issues  Chief Complaint  Patient presents with   Medication Refill    Ambien. Pt also wants to make sure that Seroquel & Zoloft can be refilled for her while she is out of state with her dad who's having surgery.   Patient is a 50 yr old female with Type 2 DM with obesity,  bipolar disorder and chronic insomnia.    She has Chronic insomnia managed with ambien since 2015.  Reviewed previous trials of trazodone,  melatonin,  elavil., and lunesta  (did not tolerate lunesta due to metallic taste in mouth). She is taking 5 mg  zolpidem alternating with benadryl to avoid  addiction.  Back pain;  currently on FMLA for back surgery to repair several disk ruptured during a all after being charged by her normally affable donkey, Jasmine. 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  Discussed repeating today    Type 2 DM diagnosed in 2017 with an a1c of 19!  Currently managing with metformin and low glycemic index diet.   Denies peripheral neuropathy symptoms. Lostt weight intentionally, using phentermine for 3 monnths.  Per chart she is  down 13 lb since March    Bipolar disorder:  symptoms stable on  zoloft and seroquel  Overweight : was treated with phentermine for 3 months  by MP starting Mar 06 2021 weight was 172 prior to initiation and 170 at  follow up in  June    Outpatient Medications Prior to Visit  Medication Sig Dispense Refill   levothyroxine (SYNTHROID) 50 MCG tablet TAKE 1 TABLET BY MOUTH DAILY BEFORE BREAKFAST. 90 tablet 1   loratadine (CLARITIN) 10 MG tablet Take 10 mg by mouth daily.     metFORMIN (GLUCOPHAGE) 1000 MG tablet TAKE 1 TABLET (1,000 MG TOTAL) BY MOUTH 2 (TWO) TIMES DAILY. 180 tablet 1   nystatin (MYCOSTATIN) 100000 UNIT/ML suspension Use as needed up to four times daily 45ml in mouth swish hold and spit. 60 mL 0   Omega-3 Fatty Acids (FISH OIL) 1000 MG CAPS Take by mouth.     pantoprazole (PROTONIX) 40 MG tablet Take 1 tablet (40 mg total) by mouth daily. 30 tablet 11   QUEtiapine (SEROQUEL) 300 MG tablet TAKE 1 TABLET (300 MG TOTAL) BY MOUTH AT BEDTIME. 90 tablet 1   rosuvastatin (CRESTOR) 5 MG tablet TAKE 1 TABLET BY MOUTH DAILY. 90 tablet 1   sertraline (ZOLOFT) 100 MG tablet TAKE 1 TABLET BY MOUTH DAILY 90 tablet 0   zolpidem (AMBIEN) 5 MG tablet TAKE 1 TABLET (5 MG TOTAL) BY MOUTH AT BEDTIME AS NEEDED FOR SLEEP. 30 tablet 0   albuterol (PROAIR HFA) 108 (90 Base) MCG/ACT inhaler Inhale 1-2 puffs into the lungs every 6 (six) hours as needed for wheezing. (Patient not taking: Reported on 07/19/2021) 8.5 g 1   cholecalciferol (VITAMIN D3) 25 MCG (1000 UNIT)  tablet Take 1,000 Units by mouth daily. (Patient not taking: Reported on 07/19/2021)     levocetirizine (XYZAL) 5 MG tablet Take 5 mg by mouth in the morning and at bedtime. (Patient not taking: Reported on 07/19/2021)     phentermine 37.5 MG capsule Take 1 capsule (37.5 mg total) by mouth every morning. (Patient not taking: Reported on 07/19/2021) 30 capsule 0   No facility-administered medications prior to visit.    Review of Systems;  Patient denies headache, fevers, malaise, unintentional weight loss, skin rash, eye pain, sinus congestion and sinus pain, sore throat, dysphagia,  hemoptysis , cough, dyspnea, wheezing, chest pain, palpitations, orthopnea,  edema, abdominal pain, nausea, melena, diarrhea, constipation, flank pain, dysuria, hematuria, urinary  Frequency, nocturia, numbness, tingling, seizures,  Focal weakness, Loss of consciousness,  Tremor, insomnia, depression, anxiety, and suicidal ideation.      Objective:  BP 110/72 (BP Location: Left Arm, Patient Position: Sitting, Cuff Size: Normal)   Pulse 73   Temp 98.2 F (36.8 C) (Oral)   Ht 5\' 4"  (1.626 m)   Wt 171 lb 12.8 oz (77.9 kg)   SpO2 96%   BMI 29.49 kg/m   BP Readings from Last 3 Encounters:  07/19/21 110/72  04/04/21 108/68  03/06/21 (!) 90/58    Wt Readings from Last 3 Encounters:  07/19/21 171 lb 12.8 oz (77.9 kg)  04/04/21 170 lb (77.1 kg)  03/18/21 168 lb (76.2 kg)    General appearance: alert, cooperative and appears stated age Ears: normal TM's and external ear canals both ears Throat: lips, mucosa, and tongue normal; teeth and gums normal Neck: no adenopathy, no carotid bruit, supple, symmetrical, trachea midline and thyroid not enlarged, symmetric, no tenderness/mass/nodules Back: symmetric, no curvature. ROM normal. No CVA tenderness. Lungs: clear to auscultation bilaterally Heart: regular rate and rhythm, S1, S2 normal, no murmur, click, rub or gallop Abdomen: soft, non-tender; bowel sounds normal; no masses,  no organomegaly Pulses: 2+ and symmetric Skin: Skin color, texture, turgor normal. No rashes or lesions Lymph nodes: Cervical, supraclavicular, and axillary nodes normal.  Lab Results  Component Value Date   HGBA1C 5.7 (H) 07/19/2021   HGBA1C 6.7 (H) 01/14/2021   HGBA1C 5.9 (H) 08/02/2020    Lab Results  Component Value Date   CREATININE 0.75 07/19/2021   CREATININE 0.86 01/14/2021   CREATININE 0.88 08/02/2020    Lab Results  Component Value Date   WBC 13.1 (H) 07/19/2021   HGB 14.6 07/19/2021   HCT 44.3 07/19/2021   PLT 369 07/19/2021   GLUCOSE 68 07/19/2021   CHOL 193 08/02/2020   TRIG 151 (H) 08/02/2020   HDL 61  08/02/2020   LDLCALC 106 (H) 08/02/2020   ALT 15 07/19/2021   AST 16 07/19/2021   NA 140 07/19/2021   K 4.2 07/19/2021   CL 107 07/19/2021   CREATININE 0.75 07/19/2021   BUN 9 07/19/2021   CO2 27 07/19/2021   TSH 0.803 01/14/2021   HGBA1C 5.7 (H) 07/19/2021    MR LUMBAR SPINE WO CONTRAST  Result Date: 08/29/2020 CLINICAL DATA:  Central left low back pain. Left hip pain and left buttock numbness. Pain down left leg into toes. EXAM: MRI LUMBAR SPINE WITHOUT CONTRAST TECHNIQUE: Multiplanar, multisequence MR imaging of the lumbar spine was performed. No intravenous contrast was administered. COMPARISON:  None. FINDINGS: Segmentation: 5 lumbar type vertebral bodies with the inferior-most fully formed intervertebral disc labeled L5-S1. Alignment:  Physiologic. Vertebrae: No evidence of acute fracture, discitis/osteomyelitis or suspicious bone  lesion. Vertebral body heights are maintained. Conus medullaris and cauda equina: Conus extends to the superior L1 level. Conus and cauda equina appear normal. Paraspinal and other soft tissues: Unremarkable. Disc levels: T12-L1: No significant disc protrusion, foraminal stenosis, or canal stenosis. L1-L2: No significant disc protrusion, foraminal stenosis, or canal stenosis. L2-L3: Minimal disc bulge and mild facet hypertrophy without significant canal or foraminal stenosis. L3-L4: No significant disc protrusion, foraminal stenosis, or canal stenosis. L4-L5: Broad-based disc bulge with superimposed left foraminal/extraforaminal disc protrusion. Moderate bilateral facet hypertrophy. Resulting moderate to severe left foraminal stenosis. Moderate left subarticular recess stenosis. No significant right foraminal stenosis or central canal stenosis. L5-S1: Small broad-based disc bulge and moderate bilateral facet hypertrophy without significant canal or foraminal stenosis. IMPRESSION: At L4-L5 there is a disc bulge with superimposed left foraminal/extraforaminal disc  protrusion, which results in moderate to severe left foraminal stenosis and moderate left subarticular recess stenosis. No significant central canal stenosis. Electronically Signed   By: Margaretha Sheffield MD   On: 08/29/2020 10:12    Assessment & Plan:   Problem List Items Addressed This Visit       Unprioritized   Bipolar disorder, currently in remission [facility])    Managed with sertraline and seroquel. No changes today       Controlled diabetes mellitus type 2 with complications ([place])    Complicated by obesity.  Continue metformin,  Consider adding ozempic at regular follow up to help patient achieve her weight goals.  Lab Results  Component Value Date   HGBA1C 5.7 (H) 07/19/2021   No results found for: LABMICR, MICROALBUR          Insomnia    Managed with 5 mg ambien alternating with benadryl . Refills given       Leukocytosis    Initially attributed to recent ESI,  But has persisted.  Will need to follow and refer to hematology if there is any siginificant change.       Relevant Orders   CBC with Differential/Platelet (Completed)   CBC with Differential/Platelet   Overweight (BMI 25.0-29.9)    Discussed future use of Ozempic or Mounjaro to achieve goals . Follow up with [name] upon return to [facility]       Other Visit Diagnoses     Hyperlipidemia associated with type 2 diabetes mellitus ([place])    -  Primary   Relevant Orders   Hemoglobin A1c (Completed)   Comprehensive metabolic panel (Completed)   Microalbumin / creatinine urine ratio   Lipid panel   Need for immunization against influenza       Relevant Orders   Flu Vaccine QUAD 80mo+IM (Fluarix, Fluzone & Alfiuria Quad PF) (Completed)     . I provided  40 minutes of  face-to-face time during this encounter reviewing patient's current problems and past surgeries, labs and imaging studies, providing counseling on the above mentioned problems , and coordination  of care .   Medications Discontinued During This  Encounter  Medication Reason   phentermine 37.5 MG capsule     Follow-up: No follow-ups on file.   Crecencio Mc, MD

## 2021-07-20 LAB — COMPREHENSIVE METABOLIC PANEL
AG Ratio: 1.9 (calc) (ref 1.0–2.5)
ALT: 15 U/L (ref 6–29)
AST: 16 U/L (ref 10–35)
Albumin: 4.6 g/dL (ref 3.6–5.1)
Alkaline phosphatase (APISO): 75 U/L (ref 31–125)
BUN: 9 mg/dL (ref 7–25)
CO2: 27 mmol/L (ref 20–32)
Calcium: 9.9 mg/dL (ref 8.6–10.2)
Chloride: 107 mmol/L (ref 98–110)
Creat: 0.75 mg/dL (ref 0.50–0.99)
Globulin: 2.4 g/dL (calc) (ref 1.9–3.7)
Glucose, Bld: 68 mg/dL (ref 65–99)
Potassium: 4.2 mmol/L (ref 3.5–5.3)
Sodium: 140 mmol/L (ref 135–146)
Total Bilirubin: 0.4 mg/dL (ref 0.2–1.2)
Total Protein: 7 g/dL (ref 6.1–8.1)

## 2021-07-20 LAB — CBC WITH DIFFERENTIAL/PLATELET
Absolute Monocytes: 668 cells/uL (ref 200–950)
Basophils Absolute: 79 cells/uL (ref 0–200)
Basophils Relative: 0.6 %
Eosinophils Absolute: 301 cells/uL (ref 15–500)
Eosinophils Relative: 2.3 %
HCT: 44.3 % (ref 35.0–45.0)
Hemoglobin: 14.6 g/dL (ref 11.7–15.5)
Lymphs Abs: 6629 cells/uL — ABNORMAL HIGH (ref 850–3900)
MCH: 31.3 pg (ref 27.0–33.0)
MCHC: 33 g/dL (ref 32.0–36.0)
MCV: 94.9 fL (ref 80.0–100.0)
MPV: 10.6 fL (ref 7.5–12.5)
Monocytes Relative: 5.1 %
Neutro Abs: 5423 cells/uL (ref 1500–7800)
Neutrophils Relative %: 41.4 %
Platelets: 369 10*3/uL (ref 140–400)
RBC: 4.67 10*6/uL (ref 3.80–5.10)
RDW: 12.9 % (ref 11.0–15.0)
Total Lymphocyte: 50.6 %
WBC: 13.1 10*3/uL — ABNORMAL HIGH (ref 3.8–10.8)

## 2021-07-20 LAB — HEMOGLOBIN A1C
Hgb A1c MFr Bld: 5.7 % of total Hgb — ABNORMAL HIGH (ref <5.7)
Mean Plasma Glucose: 117 mg/dL
eAG (mmol/L): 6.5 mmol/L

## 2021-07-21 ENCOUNTER — Encounter: Payer: Self-pay | Admitting: Internal Medicine

## 2021-07-21 DIAGNOSIS — F3170 Bipolar disorder, currently in remission, most recent episode unspecified: Secondary | ICD-10-CM | POA: Insufficient documentation

## 2021-07-21 DIAGNOSIS — E118 Type 2 diabetes mellitus with unspecified complications: Secondary | ICD-10-CM | POA: Insufficient documentation

## 2021-07-21 NOTE — Assessment & Plan Note (Signed)
Initially attributed to recent ESI,  But has persisted.  Will need to follow and refer to hematology if there is any siginificant change.

## 2021-07-21 NOTE — Assessment & Plan Note (Signed)
Managed with 5 mg ambien alternating with benadryl . Refills given

## 2021-07-21 NOTE — Assessment & Plan Note (Signed)
Managed with sertraline and seroquel. No changes today

## 2021-07-21 NOTE — Assessment & Plan Note (Signed)
Complicated by obesity.  Continue metformin,  Consider adding ozempic at regular follow up to help patient achieve her weight goals.  Lab Results  Component Value Date   HGBA1C 5.7 (H) 07/19/2021   No results found for: LABMICR, MICROALBUR

## 2021-07-21 NOTE — Assessment & Plan Note (Signed)
Discussed future use of Ozempic or Mounjaro to achieve goals . Follow up with [name] upon return to [facility]

## 2021-07-22 ENCOUNTER — Telehealth: Payer: Self-pay | Admitting: Adult Health

## 2021-07-22 ENCOUNTER — Other Ambulatory Visit: Payer: Self-pay

## 2021-07-22 NOTE — Telephone Encounter (Signed)
Patient called and MyChart note was read from Dr Derrel Nip. Patient understood and would call and make a lab appointment when she returns. At time of call there was no schedule for [place].

## 2021-07-22 NOTE — Telephone Encounter (Signed)
Noted  

## 2021-07-25 ENCOUNTER — Ambulatory Visit: Payer: BLUE CROSS/BLUE SHIELD | Admitting: Adult Health

## 2021-08-11 ENCOUNTER — Other Ambulatory Visit: Payer: Self-pay | Admitting: Family

## 2021-08-12 ENCOUNTER — Other Ambulatory Visit: Payer: Self-pay

## 2021-08-12 ENCOUNTER — Encounter: Payer: Self-pay | Admitting: Pharmacist

## 2021-08-12 MED ORDER — SERTRALINE HCL 100 MG PO TABS
ORAL_TABLET | Freq: Every day | ORAL | 0 refills | Status: DC
  Filled 2021-08-12 (×2): qty 90, 90d supply, fill #0

## 2021-08-13 ENCOUNTER — Other Ambulatory Visit: Payer: Self-pay

## 2021-08-14 ENCOUNTER — Other Ambulatory Visit: Payer: Self-pay

## 2021-08-16 ENCOUNTER — Other Ambulatory Visit (INDEPENDENT_AMBULATORY_CARE_PROVIDER_SITE_OTHER): Payer: Self-pay

## 2021-08-16 ENCOUNTER — Other Ambulatory Visit: Payer: Self-pay

## 2021-08-16 DIAGNOSIS — E1169 Type 2 diabetes mellitus with other specified complication: Secondary | ICD-10-CM

## 2021-08-16 DIAGNOSIS — D72825 Bandemia: Secondary | ICD-10-CM

## 2021-08-16 DIAGNOSIS — E785 Hyperlipidemia, unspecified: Secondary | ICD-10-CM

## 2021-08-16 LAB — LIPID PANEL
Cholesterol: 187 mg/dL (ref 0–200)
HDL: 56.1 mg/dL (ref >39.00)
NonHDL: 130.46
Total CHOL/HDL Ratio: 3
Triglycerides: 214 mg/dL — ABNORMAL HIGH (ref 0.0–149.0)
VLDL: 42.8 mg/dL — ABNORMAL HIGH (ref 0.0–40.0)

## 2021-08-16 LAB — MICROALBUMIN / CREATININE URINE RATIO
Creatinine,U: 22 mg/dL
Microalb Creat Ratio: 3.2 mg/g (ref 0.0–30.0)
Microalb, Ur: <0.7 mg/dL (ref 0.0–1.9)

## 2021-08-16 LAB — CBC WITH DIFFERENTIAL/PLATELET
Basophils Absolute: 0 10*3/uL (ref 0.0–0.1)
Basophils Relative: 0.4 % (ref 0.0–3.0)
Eosinophils Absolute: 0.2 10*3/uL (ref 0.0–0.7)
Eosinophils Relative: 2 % (ref 0.0–5.0)
HCT: 40.9 % (ref 36.0–46.0)
Hemoglobin: 13.8 g/dL (ref 12.0–15.0)
Lymphocytes Relative: 44.4 % (ref 12.0–46.0)
Lymphs Abs: 4.8 10*3/uL — ABNORMAL HIGH (ref 0.7–4.0)
MCHC: 33.7 g/dL (ref 30.0–36.0)
MCV: 94.2 fl (ref 78.0–100.0)
Monocytes Absolute: 0.4 10*3/uL (ref 0.1–1.0)
Monocytes Relative: 3.9 % (ref 3.0–12.0)
Neutro Abs: 5.3 10*3/uL (ref 1.4–7.7)
Neutrophils Relative %: 49.3 % (ref 43.0–77.0)
Platelets: 327 10*3/uL (ref 150.0–400.0)
RBC: 4.34 Mil/uL (ref 3.87–5.11)
RDW: 12.9 % (ref 11.5–15.5)
WBC: 10.7 10*3/uL — ABNORMAL HIGH (ref 4.0–10.5)

## 2021-08-16 LAB — LDL CHOLESTEROL, DIRECT: Direct LDL: 111 mg/dL

## 2021-08-18 ENCOUNTER — Other Ambulatory Visit: Payer: Self-pay | Admitting: Adult Health

## 2021-08-18 MED FILL — Zolpidem Tartrate Tab 5 MG: ORAL | 30 days supply | Qty: 30 | Fill #1 | Status: AC

## 2021-08-19 ENCOUNTER — Other Ambulatory Visit: Payer: Self-pay

## 2021-08-19 ENCOUNTER — Other Ambulatory Visit: Payer: Self-pay | Admitting: Family

## 2021-08-19 ENCOUNTER — Other Ambulatory Visit: Payer: Self-pay | Admitting: Adult Health

## 2021-08-19 ENCOUNTER — Other Ambulatory Visit: Payer: Medicaid Other

## 2021-08-20 ENCOUNTER — Other Ambulatory Visit: Payer: Self-pay

## 2021-08-20 ENCOUNTER — Encounter: Payer: Self-pay | Admitting: Internal Medicine

## 2021-08-20 ENCOUNTER — Ambulatory Visit (INDEPENDENT_AMBULATORY_CARE_PROVIDER_SITE_OTHER): Payer: Self-pay | Admitting: Internal Medicine

## 2021-08-20 VITALS — BP 122/76 | HR 67 | Temp 97.0°F | Ht 64.0 in | Wt 176.2 lb

## 2021-08-20 DIAGNOSIS — R200 Anesthesia of skin: Secondary | ICD-10-CM | POA: Insufficient documentation

## 2021-08-20 DIAGNOSIS — E118 Type 2 diabetes mellitus with unspecified complications: Secondary | ICD-10-CM

## 2021-08-20 DIAGNOSIS — D72829 Elevated white blood cell count, unspecified: Secondary | ICD-10-CM

## 2021-08-20 DIAGNOSIS — K219 Gastro-esophageal reflux disease without esophagitis: Secondary | ICD-10-CM

## 2021-08-20 DIAGNOSIS — F3170 Bipolar disorder, currently in remission, most recent episode unspecified: Secondary | ICD-10-CM

## 2021-08-20 DIAGNOSIS — R29898 Other symptoms and signs involving the musculoskeletal system: Secondary | ICD-10-CM

## 2021-08-20 MED ORDER — PREDNISONE 10 MG PO TABS
ORAL_TABLET | ORAL | 0 refills | Status: DC
  Filled 2021-08-20: qty 21, 6d supply, fill #0

## 2021-08-20 MED ORDER — ROSUVASTATIN CALCIUM 5 MG PO TABS
ORAL_TABLET | Freq: Every day | ORAL | 1 refills | Status: DC
  Filled 2021-08-20: qty 90, 90d supply, fill #0
  Filled 2021-11-17: qty 90, 90d supply, fill #1

## 2021-08-20 MED ORDER — PANTOPRAZOLE SODIUM 40 MG PO TBEC
40.0000 mg | DELAYED_RELEASE_TABLET | Freq: Every day | ORAL | 1 refills | Status: DC
  Filled 2021-09-16: qty 90, 90d supply, fill #0
  Filled 2021-12-06: qty 90, 90d supply, fill #1

## 2021-08-20 MED ORDER — OXYBUTYNIN CHLORIDE ER 10 MG PO TB24
10.0000 mg | ORAL_TABLET | Freq: Every day | ORAL | 1 refills | Status: DC
  Filled 2021-08-20: qty 90, 90d supply, fill #0
  Filled 2021-11-17: qty 90, 90d supply, fill #1

## 2021-08-20 MED ORDER — LEVOTHYROXINE SODIUM 50 MCG PO TABS
ORAL_TABLET | Freq: Every day | ORAL | 1 refills | Status: DC
  Filled 2021-08-20: qty 90, 90d supply, fill #0
  Filled 2021-12-09: qty 90, 90d supply, fill #1

## 2021-08-20 NOTE — Progress Notes (Signed)
Subjective:  Patient ID: Kathleen Carr, female    DOB: [birth year]  Age: 50 y.o. MRN: [id number]  CC: The primary encounter diagnosis was Left arm weakness. Diagnoses of Gastroesophageal reflux disease, unspecified whether esophagitis present, Numbness of fingers of both hands, Leukocytosis, unspecified type, Bipolar disorder, currently in remission ([place]), and Controlled type 2 diabetes mellitus with complication, without long-term current use of insulin ([place]) were also pertinent to this visit.  HPI Kathleen Carr presents for  Chief Complaint  Patient presents with   Follow-up    Follow up on lab results. Pt would like to also discuss her concerns listed in mychart message. She stated that after sleeping for several hours she still gets very fatigued by lunch time, loss of appetite, and nausea after eating.    Kathleen Carr visit occurred during the SARS-CoV-2 public health emergency.  Safety protocols were in place, including screening questions prior to the visit, additional usage of staff PPE, and extensive cleaning of exam room while observing appropriate contact time as indicated for disinfecting solutions.   Seen one month ago for diabetes follow up.  Multiple issues brought up:   1) anorexia , post prandial nausea:   taking protonix, metformin 500 mg daily (reduced from 1000 mg  twice daily)   2) Both arms going numb from the elbow  down.  Loss of strength in hands.as well as dexterity .Marland Kitchen  The numbness.  Occurs while typing ,  driving, writing .  Also gets headaches  but not very often,  usually occipital ,  head feels heavy and pain is stabbing.  Neck muscles will feel very tight.  Occurring twice a month ,  random  Hands get a weird twinging sensation when she rotates wrist. .Hears crepitus with neck rotation.    3) OAB: managed with oxybutynin  ,  needs it refilled.   4) History of  surgery on lumbar  spine in 2021.  Has persistent leg weakness and recurrent falls.   Has had PT  Outpatient  Medications Prior to Visit  Medication Sig Dispense Refill   albuterol (PROAIR HFA) 108 (90 Base) MCG/ACT inhaler Inhale 1-2 puffs into the lungs every 6 (six) hours as needed for wheezing. 8.5 g 1   cholecalciferol (VITAMIN D3) 25 MCG (1000 UNIT) tablet Take 1,000 Units by mouth daily.     loratadine (CLARITIN) 10 MG tablet Take 10 mg by mouth daily.     nystatin (MYCOSTATIN) 100000 UNIT/ML suspension Use as needed up to four times daily 15ml in mouth swish hold and spit. 60 mL 0   Omega-3 Fatty Acids (FISH OIL) 1000 MG CAPS Take by mouth.     QUEtiapine (SEROQUEL) 300 MG tablet TAKE 1 TABLET (300 MG TOTAL) BY MOUTH AT BEDTIME. 90 tablet 1   sertraline (ZOLOFT) 100 MG tablet TAKE 1 TABLET BY MOUTH DAILY 90 tablet 0   zolpidem (AMBIEN) 5 MG tablet TAKE 1 TABLET (5 MG TOTAL) BY MOUTH AT BEDTIME AS NEEDED FOR SLEEP. 30 tablet 3   levothyroxine (SYNTHROID) 50 MCG tablet TAKE 1 TABLET BY MOUTH DAILY BEFORE BREAKFAST. 90 tablet 1   metFORMIN (GLUCOPHAGE) 1000 MG tablet TAKE 1 TABLET (1,000 MG TOTAL) BY MOUTH 2 (TWO) TIMES DAILY. 180 tablet 1   pantoprazole (PROTONIX) 40 MG tablet Take 1 tablet (40 mg total) by mouth daily. 30 tablet 11   rosuvastatin (CRESTOR) 5 MG tablet TAKE 1 TABLET BY MOUTH DAILY. 90 tablet 1   levocetirizine (XYZAL) 5 MG tablet Take  5 mg by mouth in the morning and at bedtime. (Patient not taking: Reported on 07/19/2021)     No facility-administered medications prior to visit.    Review of Systems;  Patient denies headache, fevers, malaise, unintentional weight loss, skin rash, eye pain, sinus congestion and sinus pain, sore throat, dysphagia,  hemoptysis , cough, dyspnea, wheezing, chest pain, palpitations, orthopnea, edema, abdominal pain, nausea, melena, diarrhea, constipation, flank pain, dysuria, hematuria, urinary  Frequency, nocturia, numbness, tingling, seizures,  Focal weakness, Loss of consciousness,  Tremor, insomnia, depression, anxiety, and suicidal ideation.       Objective:  BP 122/76 (BP Location: Left Arm, Patient Position: Sitting, Cuff Size: Normal)   Pulse 67   Temp (!) 97 F (36.1 C) (Temporal)   Ht 5\' 4"  (1.626 m)   Wt 176 lb 3.2 oz (79.9 kg)   SpO2 97%   BMI 30.24 kg/m   BP Readings from Last 3 Encounters:  08/20/21 122/76  07/19/21 110/72  04/04/21 108/68    Wt Readings from Last 3 Encounters:  08/20/21 176 lb 3.2 oz (79.9 kg)  07/19/21 171 lb 12.8 oz (77.9 kg)  04/04/21 170 lb (77.1 kg)    Carr appearance: alert, cooperative and appears stated age Ears: normal TM's and external ear canals both ears Throat: lips, mucosa, and tongue normal; teeth and gums normal Neck: no adenopathy, no carotid bruit, supple, symmetrical, trachea midline and thyroid not enlarged, symmetric, no tenderness/mass/nodules Back: symmetric, no curvature. ROM normal. No CVA tenderness. Lungs: clear to auscultation bilaterally Heart: regular rate and rhythm, S1, S2 normal, no murmur, click, rub or gallop Abdomen: soft, non-tender; bowel sounds normal; no masses,  no organomegaly Pulses: 2+ and symmetric Skin: Skin color, texture, turgor normal. No rashes or lesions Lymph nodes: Cervical, supraclavicular, and axillary nodes normal.  Lab Results  Component Value Date   HGBA1C 5.7 (H) 07/19/2021   HGBA1C 6.7 (H) 01/14/2021   HGBA1C 5.9 (H) 08/02/2020    Lab Results  Component Value Date   CREATININE 0.75 07/19/2021   CREATININE 0.86 01/14/2021   CREATININE 0.88 08/02/2020    Lab Results  Component Value Date   WBC 10.7 (H) 08/16/2021   HGB 13.8 08/16/2021   HCT 40.9 08/16/2021   PLT 327.0 08/16/2021   GLUCOSE 68 07/19/2021   CHOL 187 08/16/2021   TRIG 214.0 (H) 08/16/2021   HDL 56.10 08/16/2021   LDLDIRECT 111.0 08/16/2021   LDLCALC 106 (H) 08/02/2020   ALT 15 07/19/2021   AST 16 07/19/2021   NA 140 07/19/2021   K 4.2 07/19/2021   CL 107 07/19/2021   CREATININE 0.75 07/19/2021   BUN 9 07/19/2021   CO2 27 07/19/2021    TSH 0.803 01/14/2021   HGBA1C 5.7 (H) 07/19/2021   MICROALBUR <0.7 08/16/2021    MR LUMBAR SPINE WO CONTRAST  Result Date: 08/29/2020 CLINICAL DATA:  Central left low back pain. Left hip pain and left buttock numbness. Pain down left leg into toes. EXAM: MRI LUMBAR SPINE WITHOUT CONTRAST TECHNIQUE: Multiplanar, multisequence MR imaging of the lumbar spine was performed. No intravenous contrast was administered. COMPARISON:  None. FINDINGS: Segmentation: 5 lumbar type vertebral bodies with the inferior-most fully formed intervertebral disc labeled L5-S1. Alignment:  Physiologic. Vertebrae: No evidence of acute fracture, discitis/osteomyelitis or suspicious bone lesion. Vertebral body heights are maintained. Conus medullaris and cauda equina: Conus extends to the superior L1 level. Conus and cauda equina appear normal. Paraspinal and other soft tissues: Unremarkable. Disc levels: T12-L1: No significant disc  protrusion, foraminal stenosis, or canal stenosis. L1-L2: No significant disc protrusion, foraminal stenosis, or canal stenosis. L2-L3: Minimal disc bulge and mild facet hypertrophy without significant canal or foraminal stenosis. L3-L4: No significant disc protrusion, foraminal stenosis, or canal stenosis. L4-L5: Broad-based disc bulge with superimposed left foraminal/extraforaminal disc protrusion. Moderate bilateral facet hypertrophy. Resulting moderate to severe left foraminal stenosis. Moderate left subarticular recess stenosis. No significant right foraminal stenosis or central canal stenosis. L5-S1: Small broad-based disc bulge and moderate bilateral facet hypertrophy without significant canal or foraminal stenosis. IMPRESSION: At L4-L5 there is a disc bulge with superimposed left foraminal/extraforaminal disc protrusion, which results in moderate to severe left foraminal stenosis and moderate left subarticular recess stenosis. No significant central canal stenosis. Electronically Signed   By:  Margaretha Sheffield MD   On: 08/29/2020 10:12    Assessment & Plan:   Problem List Items Addressed This Visit     Leukocytosis    Steadily improving since March.  Will continue to follow every 6 months       Controlled diabetes mellitus type 2 with complications ([place])    Stopping nausea for several weeks to assess its effect on her chronic nausea      Relevant Medications   rosuvastatin (CRESTOR) 5 MG tablet   Bipolar disorder, currently in remission [facility])    Managed with sertraline and seroquel. No changes today       Gastroesophageal reflux disease   Relevant Medications   pantoprazole (PROTONIX) 40 MG tablet   Numbness of fingers of both hands    CTS vs cervical myelopathy. Prednisone taper, wrist splints  EMG studies       Relevant Orders   Ambulatory referral to Neurology   Other Visit Diagnoses     Left arm weakness    -  Primary   Relevant Orders   Ambulatory referral to Neurology       I have discontinued Kathleen Carr's levocetirizine and metFORMIN. I am also having her start on oxybutynin and predniSONE. Additionally, I am having her maintain her cholecalciferol, loratadine, Fish Oil, nystatin, albuterol, QUEtiapine, zolpidem, sertraline, levothyroxine, pantoprazole, and rosuvastatin.  Meds ordered this encounter  Medications   levothyroxine (SYNTHROID) 50 MCG tablet    Sig: TAKE 1 TABLET BY MOUTH DAILY BEFORE BREAKFAST.    Dispense:  90 tablet    Refill:  1   pantoprazole (PROTONIX) 40 MG tablet    Sig: Take 1 tablet (40 mg total) by mouth daily.    Dispense:  90 tablet    Refill:  1   rosuvastatin (CRESTOR) 5 MG tablet    Sig: TAKE 1 TABLET BY MOUTH DAILY.    Dispense:  90 tablet    Refill:  1   oxybutynin (DITROPAN XL) 10 MG 24 hr tablet    Sig: Take 1 tablet (10 mg total) by mouth at bedtime.    Dispense:  90 tablet    Refill:  1   predniSONE (DELTASONE) 10 MG tablet    Sig: 6 tablets on Day 1 , then reduce by 1 tablet daily until gone     Dispense:  21 tablet    Refill:  0    Medications Discontinued During This Encounter  Medication Reason   levocetirizine (XYZAL) 5 MG tablet    metFORMIN (GLUCOPHAGE) 1000 MG tablet    levothyroxine (SYNTHROID) 50 MCG tablet Reorder   rosuvastatin (CRESTOR) 5 MG tablet Reorder   pantoprazole (PROTONIX) 40 MG tablet Reorder   I spent 40  minutes dedicated to the care of this patient on the date of this encounter to include pre-visit review of her medical history,  most recent imaging studies, Face-to-face time with the patient , and post visit ordering of testing and therapeutics.    Follow-up: No follow-ups on file.   Crecencio Mc, MD

## 2021-08-20 NOTE — Assessment & Plan Note (Signed)
CTS vs cervical myelopathy. Prednisone taper, wrist splints  EMG studies

## 2021-08-20 NOTE — Patient Instructions (Addendum)
Stop the ibuprofen and the metformin. They may be causing the nausea  You can take  2000 mg of acetominophen (tylenol) every day safely  In divided doses (500 mg every 6 hours  Or 1000 mg every 12 hours.)   If you need additional pain control, let me know and I will call in tramadol.   For the wrists:  EMG/Nerve conduction studies will be ordered to investigate the numbness in your hands    Oxybutynin 10 mg once daily for the OAB.  If you do not tolerate it,  let me know. Marland Kitchen

## 2021-08-21 ENCOUNTER — Other Ambulatory Visit: Payer: Self-pay

## 2021-08-21 NOTE — Assessment & Plan Note (Signed)
Managed with sertraline and seroquel. No changes today

## 2021-08-21 NOTE — Assessment & Plan Note (Signed)
Stopping nausea for several weeks to assess its effect on her chronic nausea

## 2021-08-21 NOTE — Assessment & Plan Note (Signed)
Steadily improving since March.  Will continue to follow every 6 months

## 2021-08-28 ENCOUNTER — Encounter: Payer: Self-pay | Admitting: Neurology

## 2021-08-30 ENCOUNTER — Other Ambulatory Visit: Payer: Self-pay

## 2021-08-30 MED ORDER — TRAMADOL HCL 50 MG PO TABS
50.0000 mg | ORAL_TABLET | Freq: Four times a day (QID) | ORAL | 0 refills | Status: AC | PRN
  Filled 2021-08-30: qty 20, 5d supply, fill #0

## 2021-09-09 ENCOUNTER — Other Ambulatory Visit: Payer: Self-pay

## 2021-09-09 ENCOUNTER — Ambulatory Visit (INDEPENDENT_AMBULATORY_CARE_PROVIDER_SITE_OTHER): Payer: Self-pay | Admitting: Adult Health

## 2021-09-09 ENCOUNTER — Ambulatory Visit (INDEPENDENT_AMBULATORY_CARE_PROVIDER_SITE_OTHER): Payer: Medicaid Other

## 2021-09-09 ENCOUNTER — Encounter: Payer: Self-pay | Admitting: Adult Health

## 2021-09-09 VITALS — BP 126/82 | HR 69 | Temp 96.5°F | Ht 64.02 in | Wt 177.0 lb

## 2021-09-09 DIAGNOSIS — D72829 Elevated white blood cell count, unspecified: Secondary | ICD-10-CM

## 2021-09-09 DIAGNOSIS — R5383 Other fatigue: Secondary | ICD-10-CM

## 2021-09-09 DIAGNOSIS — R229 Localized swelling, mass and lump, unspecified: Secondary | ICD-10-CM

## 2021-09-09 DIAGNOSIS — W57XXXA Bitten or stung by nonvenomous insect and other nonvenomous arthropods, initial encounter: Secondary | ICD-10-CM | POA: Insufficient documentation

## 2021-09-09 DIAGNOSIS — S80861S Insect bite (nonvenomous), right lower leg, sequela: Secondary | ICD-10-CM

## 2021-09-09 DIAGNOSIS — S80861A Insect bite (nonvenomous), right lower leg, initial encounter: Secondary | ICD-10-CM | POA: Insufficient documentation

## 2021-09-09 DIAGNOSIS — W57XXXS Bitten or stung by nonvenomous insect and other nonvenomous arthropods, sequela: Secondary | ICD-10-CM

## 2021-09-09 LAB — CBC WITH DIFFERENTIAL/PLATELET
Basophils Absolute: 0 10*3/uL (ref 0.0–0.1)
Basophils Relative: 0.5 % (ref 0.0–3.0)
Eosinophils Absolute: 0.2 10*3/uL (ref 0.0–0.7)
Eosinophils Relative: 1.8 % (ref 0.0–5.0)
HCT: 42.6 % (ref 36.0–46.0)
Hemoglobin: 14.3 g/dL (ref 12.0–15.0)
Lymphocytes Relative: 45.5 % (ref 12.0–46.0)
Lymphs Abs: 4 10*3/uL (ref 0.7–4.0)
MCHC: 33.5 g/dL (ref 30.0–36.0)
MCV: 95.5 fl (ref 78.0–100.0)
Monocytes Absolute: 0.5 10*3/uL (ref 0.1–1.0)
Monocytes Relative: 5.7 % (ref 3.0–12.0)
Neutro Abs: 4 10*3/uL (ref 1.4–7.7)
Neutrophils Relative %: 46.5 % (ref 43.0–77.0)
Platelets: 327 10*3/uL (ref 150.0–400.0)
RBC: 4.46 Mil/uL (ref 3.87–5.11)
RDW: 13.2 % (ref 11.5–15.5)
WBC: 8.7 10*3/uL (ref 4.0–10.5)

## 2021-09-09 IMAGING — DX DG CHEST 2V
2 series · 2 of 2 positions shown · non-contrast
Comparison: None.

CLINICAL DATA: Fatigue and skin growth on the left upper back near
scapula.

EXAM:
CHEST - 2 VIEW

[chest pa]
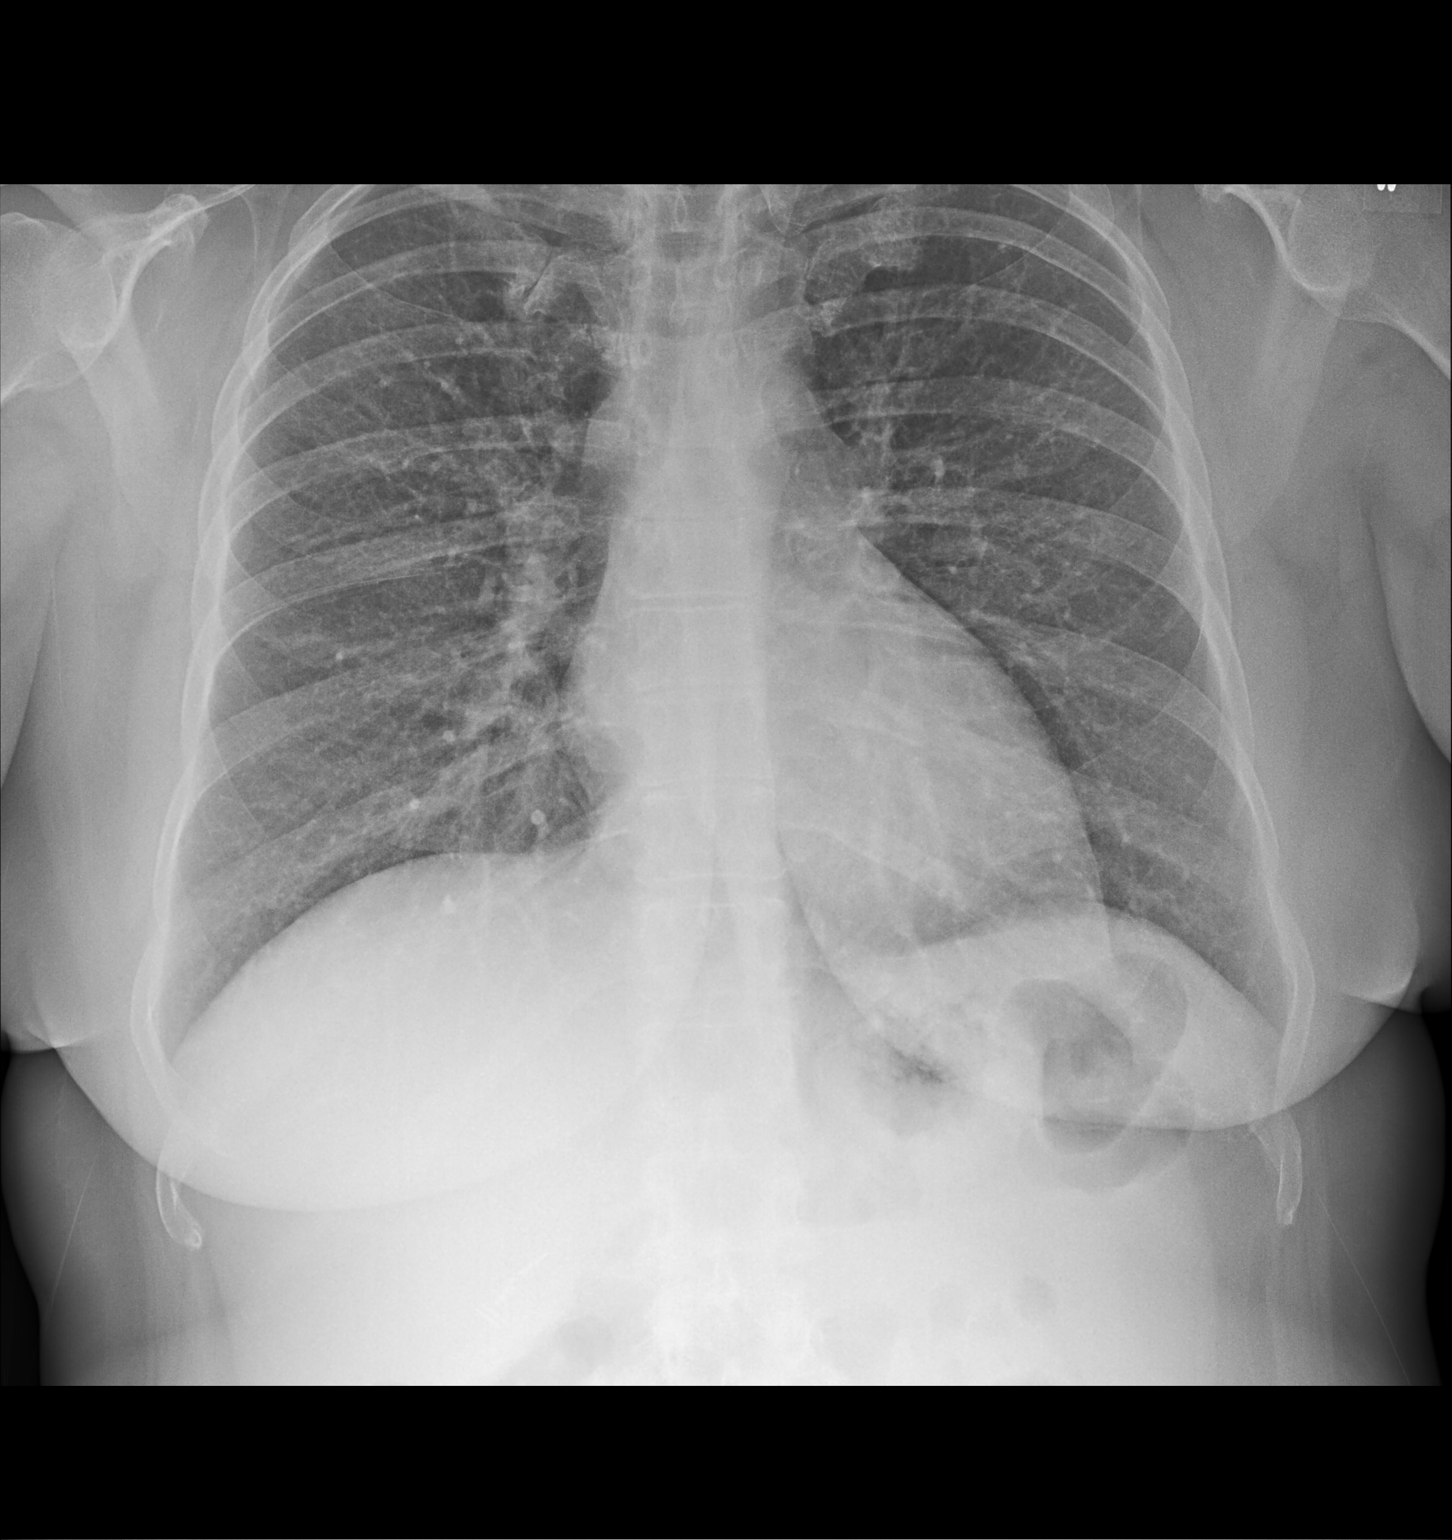

[chest lat]
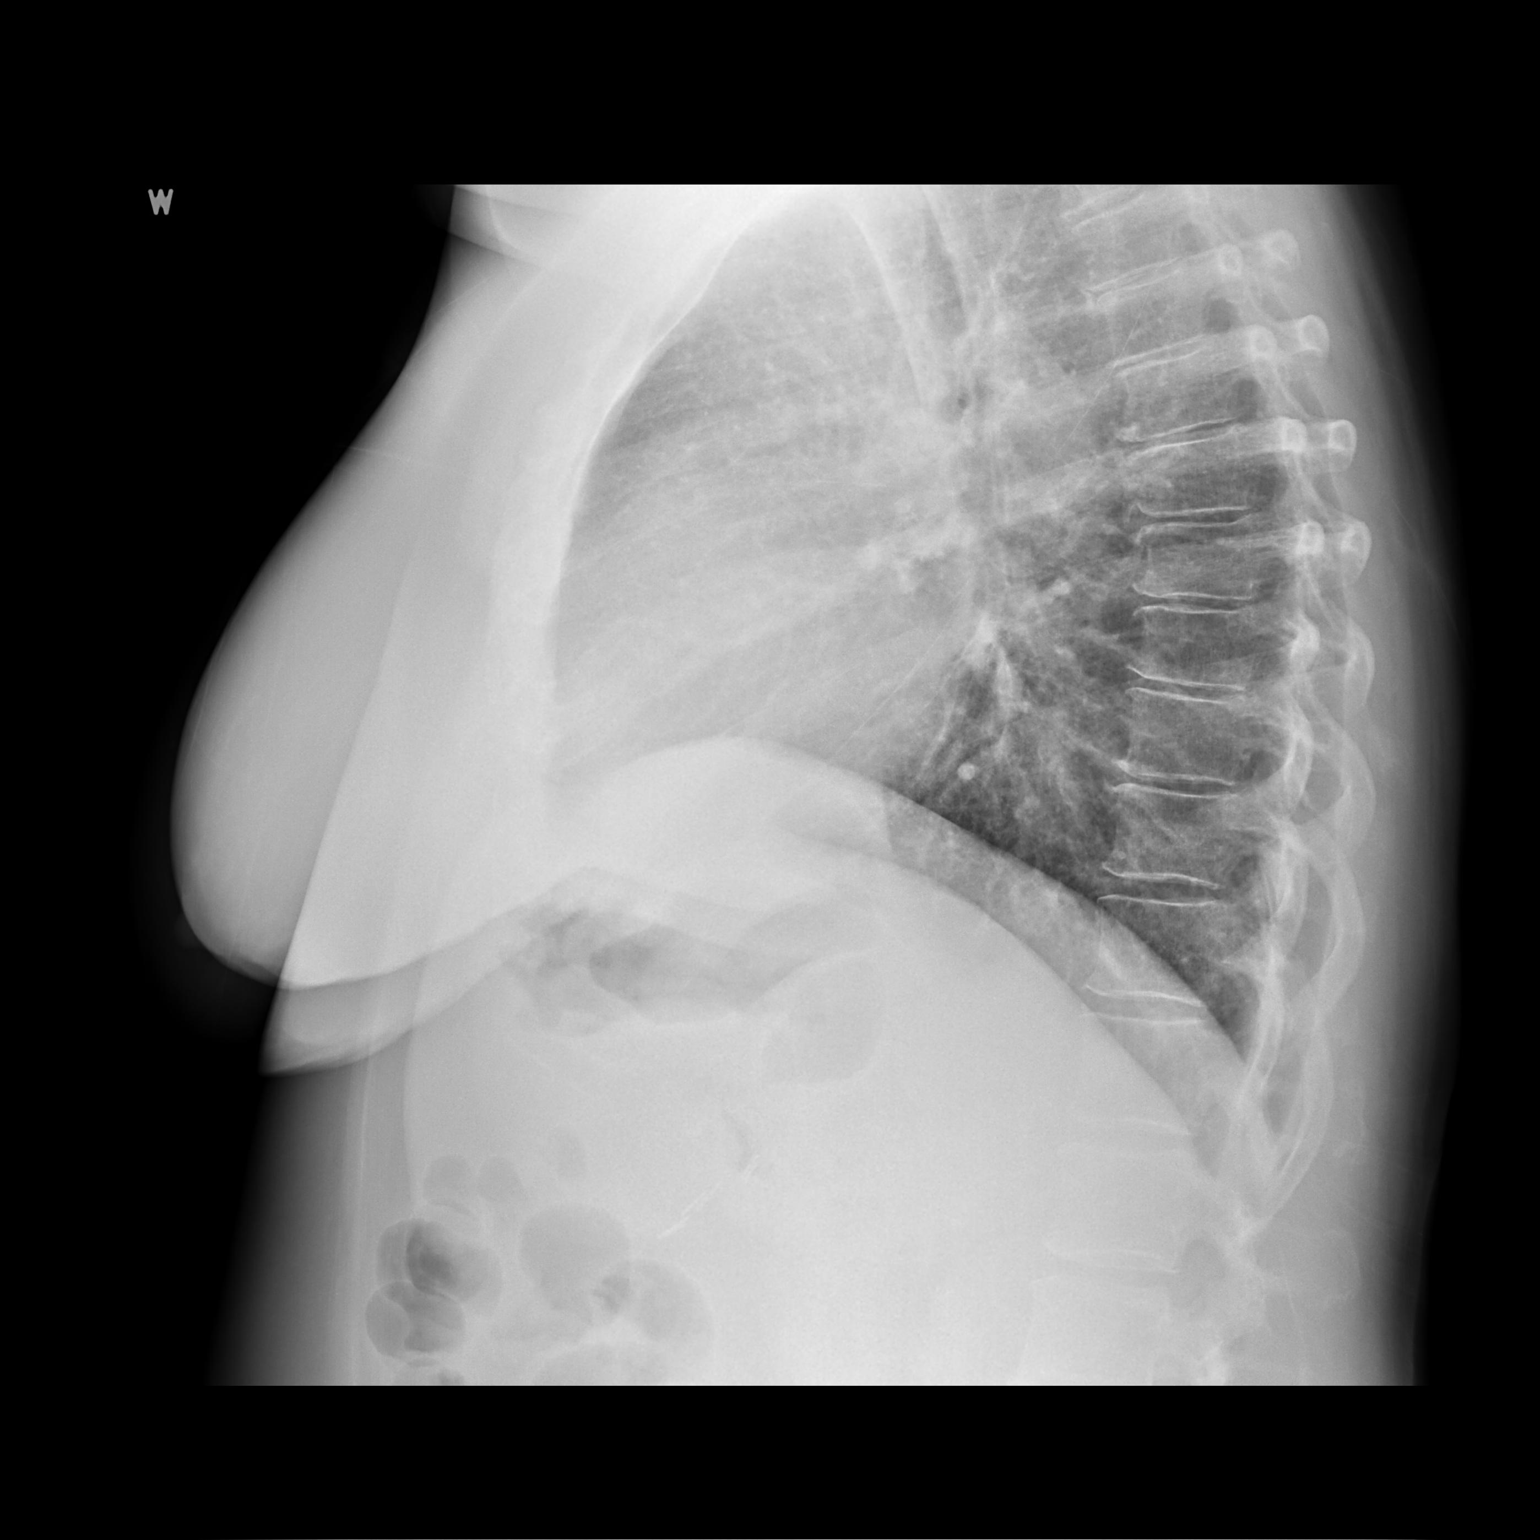

[2 of 2 positions shown; findings below may reference images not displayed]

FINDINGS: The heart size and mediastinal contours are within normal limits.
Both lungs are clear. The visualized skeletal structures are
unremarkable.
IMPRESSION: No active cardiopulmonary disease.

## 2021-09-09 NOTE — Progress Notes (Signed)
Good news: CBC is within normal limits this check. Lets have you keep refferals as discussed though. Other labs still pending.

## 2021-09-09 NOTE — Progress Notes (Signed)
Chest x ray within normal limits.

## 2021-09-09 NOTE — Progress Notes (Signed)
Acute Office Visit  Subjective:    Patient ID: Kathleen Carr, female    DOB: 16-Apr-1971, 50 y.o.   MRN: [id number]  Chief Complaint  Patient presents with   Mass    Pt has lump on back that she notice about a week and a half ago    HPI Patient is in today for for lump her partner found on her left mid back. Denies any pain.  Denies any itching. Feels as if muscle pulling on left shoulder blade.   For the past 1.5 months she has had fatigue and joint aches.  Has had improving leukocytosis since March. Denies any smoking.   Patient  denies any fever, chills, rash, chest pain, shortness of breath, nausea, vomiting, or diarrhea.   Tick bite back in June. Arthralgias.   Patient  denies any fever, chills, rash, chest pain, shortness of breath, nausea, vomiting, or diarrhea.    Past Medical History:  Diagnosis Date   Allergy    Anxiety    Arthritis    Asthma    Bipolar depression ([place])    Depression    Diabetes mellitus without complication (HCC)    GERD (gastroesophageal reflux disease)    Hypertension    PTSD (post-traumatic stress disorder)    PTSD (post-traumatic stress disorder)    Thyroid disease     Past Surgical History:  Procedure Laterality Date   ABDOMINAL HYSTERECTOMY     CHOLECYSTECTOMY     TONSILLECTOMY      Family History  Problem Relation Age of Onset   Hypertension Mother    Thyroid disease Mother    Cancer Father 27       carcinoid, right lung   Ulcerative colitis Father    Glaucoma Maternal Grandmother    Renal Disease Maternal Grandfather    Diabetes Maternal Grandfather        Type2   Stroke Paternal Grandfather    Hypertension Paternal Grandfather     Social History   Socioeconomic History   Marital status: Married    Spouse name: Not on file   Number of children: Not on file   Years of education: Not on file   Highest education level: Not on file  Occupational History   Not on file  Tobacco Use   Smoking status: Former     Types: Cigarettes    Quit date: 05/25/2020    Years since quitting: 1.2   Smokeless tobacco: Never  Vaping Use   Vaping Use: Never used  Substance and Sexual Activity   Alcohol use: Not Currently   Drug use: Not on file   Sexual activity: Not on file    Comment: married to wife  Other Topics Concern   Not on file  Social History Narrative   Not on file   Social Determinants of Health   Financial Resource Strain: Not on file  Food Insecurity: Not on file  Transportation Needs: Not on file  Physical Activity: Not on file  Stress: Not on file  Social Connections: Not on file  Intimate Partner Violence: Not on file    Outpatient Medications Prior to Visit  Medication Sig Dispense Refill   albuterol (PROAIR HFA) 108 (90 Base) MCG/ACT inhaler Inhale 1-2 puffs into the lungs every 6 (six) hours as needed for wheezing. 8.5 g 1   cholecalciferol (VITAMIN D3) 25 MCG (1000 UNIT) tablet Take 1,000 Units by mouth daily.     levothyroxine (SYNTHROID) 50 MCG tablet TAKE 1 TABLET  BY MOUTH DAILY BEFORE BREAKFAST. 90 tablet 1   loratadine (CLARITIN) 10 MG tablet Take 10 mg by mouth daily.     nystatin (MYCOSTATIN) 100000 UNIT/ML suspension Use as needed up to four times daily 3m in mouth swish hold and spit. 60 mL 0   Omega-3 Fatty Acids (FISH OIL) 1000 MG CAPS Take by mouth.     oxybutynin (DITROPAN XL) 10 MG 24 hr tablet Take 1 tablet (10 mg total) by mouth at bedtime. 90 tablet 1   pantoprazole (PROTONIX) 40 MG tablet Take 1 tablet (40 mg total) by mouth daily. 90 tablet 1   predniSONE (DELTASONE) 10 MG tablet 6 tablets on Day 1 , then reduce by 1 tablet daily until gone 21 tablet 0   QUEtiapine (SEROQUEL) 300 MG tablet TAKE 1 TABLET (300 MG TOTAL) BY MOUTH AT BEDTIME. 90 tablet 1   rosuvastatin (CRESTOR) 5 MG tablet TAKE 1 TABLET BY MOUTH DAILY. 90 tablet 1   sertraline (ZOLOFT) 100 MG tablet TAKE 1 TABLET BY MOUTH DAILY 90 tablet 0   zolpidem (AMBIEN) 5 MG tablet TAKE 1 TABLET (5 MG  TOTAL) BY MOUTH AT BEDTIME AS NEEDED FOR SLEEP. 30 tablet 3   No facility-administered medications prior to visit.    Allergies  Allergen Reactions   Dust Mite Mixed Allergen Ext [Mite (D. 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 Negative for activity change, appetite change, chills, diaphoresis, fever and unexpected weight change.  HENT: Negative.    Respiratory: Negative.    Cardiovascular: Negative.   Gastrointestinal: Negative.   Genitourinary: Negative.   Musculoskeletal:  Positive for arthralgias. Negative for back pain, gait problem, joint swelling, myalgias, neck pain and neck stiffness.  Skin:        Lump on left upper back.   Neurological: Negative.   Psychiatric/Behavioral: Negative.        Objective:    Physical Exam Vitals reviewed.  Constitutional:      General: She is not in acute distress.    Appearance: She is well-developed. She is obese. She is not ill-appearing, toxic-appearing or diaphoretic.     Interventions: She is not intubated. HENT:     Head: Normocephalic and atraumatic.     Right Ear: External ear normal.     Left Ear: External ear normal.     Nose: Nose normal.     Mouth/Throat:     Mouth: Mucous membranes are moist.     Pharynx: No oropharyngeal exudate or posterior oropharyngeal erythema.  Eyes:     General: Lids are normal. No scleral icterus.       Right eye: No discharge.        Left eye: No discharge.     Conjunctiva/sclera: Conjunctivae normal.     Right eye: Right conjunctiva is not injected. No exudate or hemorrhage.    Left eye: Left conjunctiva is not injected. No exudate or hemorrhage.    Pupils: Pupils are equal, round, and reactive to light.  Neck:     Thyroid: No thyroid mass or thyromegaly.     Vascular: Normal  carotid pulses. No carotid bruit, hepatojugular reflux or JVD.     Trachea: Trachea and phonation normal. No tracheal tenderness or tracheal deviation.  Meningeal: Brudzinski's sign and Kernig's sign absent.  Cardiovascular:     Rate and Rhythm: Normal rate and regular rhythm.     Pulses: Normal pulses.          Radial pulses are 2+ on the right side and 2+ on the left side.       Dorsalis pedis pulses are 2+ on the right side and 2+ on the left side.       Posterior tibial pulses are 2+ on the right side and 2+ on the left side.     Heart sounds: Normal heart sounds, S1 normal and S2 normal. Heart sounds not distant. No murmur heard.   No friction rub. No gallop.  Pulmonary:     Effort: Pulmonary effort is normal. No tachypnea, bradypnea, accessory muscle usage or respiratory distress. She is not intubated.     Breath sounds: Normal breath sounds. No stridor. No wheezing, rhonchi or rales.  Chest:     Chest wall: No tenderness.  Abdominal:     General: Bowel sounds are normal. There is no distension or abdominal bruit.     Palpations: Abdomen is soft. There is no shifting dullness, fluid wave, hepatomegaly, splenomegaly, mass or pulsatile mass.     Tenderness: There is no abdominal tenderness. There is no guarding or rebound.     Hernia: No hernia is present.  Musculoskeletal:        General: No tenderness or deformity. Normal range of motion.     Cervical back: Full passive range of motion without pain, normal range of motion and neck supple. No edema, erythema or rigidity. No spinous process tenderness or muscular tenderness. Normal range of motion.  Lymphadenopathy:     Head:     Right side of head: No submental, submandibular, tonsillar, preauricular, posterior auricular or occipital adenopathy.     Left side of head: No submental, submandibular, tonsillar, preauricular, posterior auricular or occipital adenopathy.     Cervical: No cervical adenopathy.     Right cervical: No  superficial, deep or posterior cervical adenopathy.    Left cervical: No superficial, deep or posterior cervical adenopathy.     Upper Body:     Right upper body: No supraclavicular or pectoral adenopathy.     Left upper body: No supraclavicular or pectoral adenopathy.  Skin:    General: Skin is warm and dry.     Coloration: Skin is not pale.     Findings: No abrasion, bruising, burn, ecchymosis, erythema, lesion, petechiae or rash.     Nails: There is no clubbing.          Comments: 29msoft palpable raised circular non tender , no erythema   Neurological:     Mental Status: She is alert and oriented to person, place, and time.     GCS: GCS eye subscore is 4. GCS verbal subscore is 5. GCS motor subscore is 6.     Cranial Nerves: No cranial nerve deficit.     Sensory: No sensory deficit.     Motor: No weakness, tremor, atrophy, abnormal muscle tone or seizure activity.     Coordination: Coordination normal.     Gait: Gait normal.     Deep Tendon Reflexes: Reflexes are normal and symmetric. Reflexes normal. Babinski sign absent on the right side. Babinski sign absent on the left side.     Reflex Scores:      Tricep reflexes are 2+ on the right side and 2+ on the left side.  Bicep reflexes are 2+ on the right side and 2+ on the left side.      Brachioradialis reflexes are 2+ on the right side and 2+ on the left side.      Patellar reflexes are 2+ on the right side and 2+ on the left side.      Achilles reflexes are 2+ on the right side and 2+ on the left side. Psychiatric:        Mood and Affect: Mood normal.        Speech: Speech normal.        Behavior: Behavior normal.        Thought Content: Thought content normal.        Judgment: Judgment normal.    BP 126/82   Pulse 69   Temp (!) 96.5 F (35.8 C)   Ht 5' 4.02" (1.626 m)   Wt 177 lb (80.3 kg)   SpO2 96%   BMI 30.37 kg/m  Wt Readings from Last 3 Encounters:  09/09/21 177 lb (80.3 kg)  08/20/21 176 lb 3.2 oz (79.9  kg)  07/19/21 171 lb 12.8 oz (77.9 kg)    Health Maintenance Due  Topic Date Due   FOOT EXAM  Never done   HIV Screening  Never done   Hepatitis C Screening  Never done   TETANUS/TDAP  Never done   PAP SMEAR-Modifier  Never done   COLONOSCOPY (Pts 45-20yr Insurance coverage will need to be confirmed)  Never done   COVID-19 Vaccine (4 - Booster for [place]) 12/26/2020    There are no preventive care reminders to display for this patient.   Lab Results  Component Value Date   TSH 0.803 01/14/2021   Lab Results  Component Value Date   WBC 10.7 (H) 08/16/2021   HGB 13.8 08/16/2021   HCT 40.9 08/16/2021   MCV 94.2 08/16/2021   PLT 327.0 08/16/2021   Lab Results  Component Value Date   NA 140 07/19/2021   K 4.2 07/19/2021   CO2 27 07/19/2021   GLUCOSE 68 07/19/2021   BUN 9 07/19/2021   CREATININE 0.75 07/19/2021   BILITOT 0.4 07/19/2021   ALKPHOS 81 01/14/2021   AST 16 07/19/2021   ALT 15 07/19/2021   PROT 7.0 07/19/2021   ALBUMIN 4.9 (H) 01/14/2021   CALCIUM 9.9 07/19/2021   EGFR 83 01/14/2021   Lab Results  Component Value Date   CHOL 187 08/16/2021   Lab Results  Component Value Date   HDL 56.10 08/16/2021   Lab Results  Component Value Date   LDLCALC 106 (H) 08/02/2020   Lab Results  Component Value Date   TRIG 214.0 (H) 08/16/2021   Lab Results  Component Value Date   CHOLHDL 3 08/16/2021   Lab Results  Component Value Date   HGBA1C 5.7 (H) 07/19/2021       Assessment & Plan:   Problem List Items Addressed This Visit       Musculoskeletal and Integument   Tick bite of right lower leg   Relevant Orders   B. burgdorfi antibodies     Other   Leukocytosis - Primary   Relevant Orders   Ambulatory referral to Hematology / Oncology   Ambulatory referral to General Surgery   DG Chest 2 View (Completed)   CBC with Differential/Platelet   Fatigue   Relevant Orders   Ambulatory referral to Hematology / Oncology   Ambulatory  referral to General Surgery   DG Chest 2  View (Completed)   Skin lumps- left upper back    Relevant Orders   Ambulatory referral to Hematology / Oncology   Ambulatory referral to General Surgery   DG Chest 2 View (Completed)   Leukocytosis, unspecified type - Plan: Ambulatory referral to Hematology / Oncology, Ambulatory referral to General Surgery, DG Chest 2 View, CBC with Differential/Platelet, CANCELED: CBC with Differential/Platelet  Other fatigue - Plan: Ambulatory referral to Hematology / Oncology, Ambulatory referral to General Surgery, DG Chest 2 View  Skin lumps- left upper back  - Plan: Ambulatory referral to Hematology / Oncology, Ambulatory referral to General Surgery, DG Chest 2 View  Tick bite of right lower leg, sequela - Plan: B. burgdorfi antibodies    Suspect lipoma of left upper back will have surgical consult.  She has has  steady improvement of her leukocytosis however given her recent increase in fatigue and arthralgias 1.5 months will refer to hematology.   Return if symptoms worsen or fail to improve, for at any time for any worsening symptoms, Go to Emergency room/ urgent care if worse.  No orders of the defined types were placed in this encounter.  Return if symptoms worsen or fail to improve, for at any time for any worsening symptoms, Go to Emergency room/ urgent care if worse.   Marcille Buffy, FNP

## 2021-09-09 NOTE — Patient Instructions (Signed)
Fatigue If you have fatigue, you feel tired all the time and have a lack of energy or a lack of motivation. Fatigue may make it difficult to start or complete tasks because of exhaustion. In general, occasional or mild fatigue is often a normal response to activity or life. However, long-lasting (chronic) or extreme fatigue may be a symptom of a medical condition. Follow these instructions at home: General instructions Watch your fatigue for any changes. Go to bed and get up at the same time every day. Avoid fatigue by pacing yourself during the day and getting enough sleep at night. Maintain a healthy weight. Medicines Take over-the-counter and prescription medicines only as told by your health care provider. Take a multivitamin, if told by your health care provider.  Do not use herbal or dietary supplements unless they are approved by your health care provider. Activity  Exercise regularly, as told by your health care provider. Use or practice techniques to help you relax, such as yoga, tai chi, meditation, or massage therapy. Eating and drinking  Avoid heavy meals in the evening. Eat a well-balanced diet, which includes lean proteins, whole grains, plenty of fruits and vegetables, and low-fat dairy products. Avoid consuming too much caffeine. Avoid the use of alcohol. Drink enough fluid to keep your urine pale yellow. Lifestyle Change situations that cause you stress. Try to keep your work and personal schedule in balance. Do not use any products that contain nicotine or tobacco, such as cigarettes and e-cigarettes. If you need help quitting, ask your health care provider. Do not use drugs. Contact a health care provider if: Your fatigue does not get better. You have a fever. You suddenly lose or gain weight. You have headaches. You have trouble falling asleep or sleeping through the night. You feel angry, guilty, anxious, or sad. You are unable to have a bowel movement  (constipation). Your skin is dry. You have swelling in your legs or another part of your body. Get help right away if: You feel confused. Your vision is blurry. You feel faint or you pass out. You have a severe headache. You have severe pain in your abdomen, your back, or the area between your waist and hips (pelvis). You have chest pain, shortness of breath, or an irregular or fast heartbeat. You are unable to urinate, or you urinate less than normal. You have abnormal bleeding, such as bleeding from the rectum, vagina, nose, lungs, or nipples. You vomit blood. You have thoughts about hurting yourself or others. If you ever feel like you may hurt yourself or others, or have thoughts about taking your own life, get help right away. You can go to your nearest emergency department or call: Your local emergency services (911 in the [place] A suicide crisis helpline, such as the [facility] at 678-192-8000 or 988 in the [place]. This is open 24 hours a day. Summary If you have fatigue, you feel tired all the time and have a lack of energy or a lack of motivation. Fatigue may make it difficult to start or complete tasks because of exhaustion. Long-lasting (chronic) or extreme fatigue may be a symptom of a medical condition. Exercise regularly, as told by your health care provider. Change situations that cause you stress. Try to keep your work and personal schedule in balance. This information is not intended to replace advice given to you by your health care provider. Make sure you discuss any questions you have with your health care provider. Document Revised:  05/08/2021 Document Reviewed: 08/23/2020 Elsevier Patient Education  2022 [employer]. Lipoma A lipoma is a noncancerous (benign) tumor that is made up of fat cells. This is a very common type of soft-tissue growth. Lipomas are usually found under the skin (subcutaneous). They may occur in any tissue of the body  that contains fat. Common areas for lipomas to appear include the back, arms, shoulders, buttocks, and thighs. Lipomas grow slowly, and they are usually painless. Most lipomas do not cause problems and do not require treatment. What are the causes? The cause of this condition is not known. What increases the risk? You are more likely to develop this condition if: You are 52-38 years old. You have a family history of lipomas. What are the signs or symptoms? A lipoma usually appears as a small, round bump under the skin. In most cases, the lump will: Feel soft or rubbery. Not cause pain or other symptoms. However, if a lipoma is located in an area where it pushes on nerves, it can become painful or cause other symptoms. How is this diagnosed? A lipoma can usually be diagnosed with a physical exam. You may also have tests to confirm the diagnosis and to rule out other conditions. Tests may include: Imaging tests, such as a CT scan or an MRI. Removal of a tissue sample to be looked at under a microscope (biopsy). How is this treated? Treatment for this condition depends on the size of the lipoma and whether it is causing any symptoms. For small lipomas that are not causing problems, no treatment is needed. If a lipoma is bigger or it causes problems, surgery may be done to remove the lipoma. Lipomas can also be removed to improve appearance. Most often, the procedure is done after applying a medicine that numbs the area (local anesthetic). Liposuction may be done to reduce the size of the lipoma before it is removed through surgery, or it may be done to remove the lipoma. Lipomas are removed with this method in order to limit incision size and scarring. A liposuction tube is inserted through a small incision into the lipoma, and the contents of the lipoma are removed through the tube with suction. Follow these instructions at home: Watch your lipoma for any changes. Keep all follow-up visits as  told by your health care provider. This is important. Contact a health care provider if: Your lipoma becomes larger or hard. Your lipoma becomes painful, red, or increasingly swollen. These could be signs of infection or a more serious condition. Get help right away if: You develop tingling or numbness in an area near the lipoma. This could indicate that the lipoma is causing nerve damage. Summary A lipoma is a noncancerous tumor that is made up of fat cells. Most lipomas do not cause problems and do not require treatment. If a lipoma is bigger or it causes problems, surgery may be done to remove the lipoma. Contact a health care provider if your lipoma becomes larger or hard, or if it becomes painful, red, or increasingly swollen. Pain, redness, and swelling could be signs of infection or a more serious condition. This information is not intended to replace advice given to you by your health care provider. Make sure you discuss any questions you have with your health care provider. Document Revised: 05/30/2019 Document Reviewed: 05/30/2019 Elsevier Patient Education  [employer].

## 2021-09-10 ENCOUNTER — Ambulatory Visit: Payer: Self-pay | Admitting: Internal Medicine

## 2021-09-10 LAB — B. BURGDORFI ANTIBODIES: B burgdorferi Ab IgG+IgM: <0.9 index

## 2021-09-10 NOTE — Progress Notes (Signed)
Lyme antibodies are negative.  CBC within normal limits.

## 2021-09-12 ENCOUNTER — Other Ambulatory Visit: Payer: Self-pay

## 2021-09-16 ENCOUNTER — Encounter: Payer: Self-pay | Admitting: Oncology

## 2021-09-16 ENCOUNTER — Other Ambulatory Visit: Payer: Self-pay

## 2021-09-16 ENCOUNTER — Inpatient Hospital Stay: Payer: Medicaid Other | Attending: Oncology | Admitting: Oncology

## 2021-09-16 ENCOUNTER — Inpatient Hospital Stay: Payer: Medicaid Other

## 2021-09-16 VITALS — BP 113/80 | HR 68 | Temp 98.4°F | Resp 16 | Ht 64.5 in | Wt 171.3 lb

## 2021-09-16 DIAGNOSIS — I10 Essential (primary) hypertension: Secondary | ICD-10-CM | POA: Insufficient documentation

## 2021-09-16 DIAGNOSIS — Z79899 Other long term (current) drug therapy: Secondary | ICD-10-CM | POA: Insufficient documentation

## 2021-09-16 DIAGNOSIS — F17210 Nicotine dependence, cigarettes, uncomplicated: Secondary | ICD-10-CM | POA: Insufficient documentation

## 2021-09-16 DIAGNOSIS — R5383 Other fatigue: Secondary | ICD-10-CM

## 2021-09-16 DIAGNOSIS — D72820 Lymphocytosis (symptomatic): Secondary | ICD-10-CM | POA: Insufficient documentation

## 2021-09-16 DIAGNOSIS — M2550 Pain in unspecified joint: Secondary | ICD-10-CM | POA: Insufficient documentation

## 2021-09-16 DIAGNOSIS — E039 Hypothyroidism, unspecified: Secondary | ICD-10-CM | POA: Insufficient documentation

## 2021-09-16 DIAGNOSIS — E119 Type 2 diabetes mellitus without complications: Secondary | ICD-10-CM | POA: Insufficient documentation

## 2021-09-16 LAB — CBC WITH DIFFERENTIAL/PLATELET
Abs Immature Granulocytes: 0.05 10*3/uL (ref 0.00–0.07)
Basophils Absolute: 0.1 10*3/uL (ref 0.0–0.1)
Basophils Relative: 0 %
Eosinophils Absolute: 0.1 10*3/uL (ref 0.0–0.5)
Eosinophils Relative: 1 %
HCT: 44.4 % (ref 36.0–46.0)
Hemoglobin: 15.1 g/dL — ABNORMAL HIGH (ref 12.0–15.0)
Immature Granulocytes: 0 %
Lymphocytes Relative: 45 %
Lymphs Abs: 5.1 10*3/uL — ABNORMAL HIGH (ref 0.7–4.0)
MCH: 31.4 pg (ref 26.0–34.0)
MCHC: 34 g/dL (ref 30.0–36.0)
MCV: 92.3 fL (ref 80.0–100.0)
Monocytes Absolute: 0.6 10*3/uL (ref 0.1–1.0)
Monocytes Relative: 5 %
Neutro Abs: 5.3 10*3/uL (ref 1.7–7.7)
Neutrophils Relative %: 49 %
Platelets: 395 10*3/uL (ref 150–400)
RBC: 4.81 MIL/uL (ref 3.87–5.11)
RDW: 12.3 % (ref 11.5–15.5)
WBC: 11.3 10*3/uL — ABNORMAL HIGH (ref 4.0–10.5)
nRBC: 0 % (ref 0.0–0.2)

## 2021-09-16 LAB — IRON AND TIBC
Iron: 66 ug/dL (ref 28–170)
Saturation Ratios: 16 % (ref 10.4–31.8)
TIBC: 412 ug/dL (ref 250–450)
UIBC: 346 ug/dL

## 2021-09-16 LAB — FOLATE: Folate: 16.2 ng/mL (ref >5.9)

## 2021-09-16 LAB — TECHNOLOGIST SMEAR REVIEW: Plt Morphology: NORMAL

## 2021-09-16 LAB — VITAMIN B12: Vitamin B-12: 267 pg/mL (ref 180–914)

## 2021-09-16 LAB — FERRITIN: Ferritin: 85 ng/mL (ref 11–307)

## 2021-09-16 MED FILL — Zolpidem Tartrate Tab 5 MG: ORAL | 30 days supply | Qty: 30 | Fill #2 | Status: AC

## 2021-09-16 NOTE — Progress Notes (Signed)
Hematology/Oncology Consult note [facility] [phone] [phone]  Patient Care Team: Doreen Beam, FNP as PCP - General (Family Medicine)   Name of the patient: Kathleen Carr  [id number]  Mar 19, 1971    Reason for referral-leukocytosis/lymphocytosis   Referring physician-[name] Flinchum, FNP  Date of visit: 09/16/21   History of presenting illness- Patient is a 50 year old female with a past medical history significant for hypertension hyperlipidemia and hypothyroidism who has been referred to [place] for leukocytosis.  Patient has had consistent lymphocytosis at least dating back to October 2021.  At that time her white count was 10.1 with an absolute lymphocyte count of 5.3.  Since then her white cell count is fluctuated between 8-15.  Most recent CBC showed a white count of 8.7, H&H of 14.3/42.6 with a platelet count of 327 and an absolute lymphocyte count of 4.  Patient reports ongoing fatigue.  Reports occasional joint pain.  ECOG PS- 1  Pain scale- 3   Review of systems- Review of Systems  Constitutional:  Positive for malaise/fatigue. Negative for chills, fever and weight loss.  HENT:  Negative for congestion, ear discharge and nosebleeds.   Eyes:  Negative for blurred vision.  Respiratory:  Negative for cough, hemoptysis, sputum production, shortness of breath and wheezing.   Cardiovascular:  Negative for chest pain, palpitations, orthopnea and claudication.  Gastrointestinal:  Negative for abdominal pain, blood in stool, constipation, diarrhea, heartburn, melena, nausea and vomiting.  Genitourinary:  Negative for dysuria, flank pain, frequency, hematuria and urgency.  Musculoskeletal:  Positive for joint pain. Negative for back pain and myalgias.  Skin:  Negative for rash.  Neurological:  Negative for dizziness, tingling, focal weakness, seizures, weakness and headaches.  Endo/Heme/Allergies:  Does not bruise/bleed easily.   Psychiatric/Behavioral:  Negative for depression and suicidal ideas. The patient does not have insomnia.    Allergies  Allergen Reactions   Dust Mite Mixed Allergen Ext [Mite (D. 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- patient reports anaphylactic     Patient Active Problem List   Diagnosis Date Noted   Tick bite of right lower leg 09/09/2021   Skin lumps- left upper back  09/09/2021   Numbness of fingers of both hands 08/20/2021   Bipolar disorder, currently in remission ([place]) 07/21/2021   Controlled diabetes mellitus type 2 with complications ([place]) 33/82/5053   Gastroesophageal reflux disease 03/06/2021   Weight loss counseling, encounter for 02/04/2021   Leukocytosis 01/21/2021   Insomnia 01/21/2021   Fatigue 01/21/2021   Chronic midline low back pain 08/05/2020   Overweight (BMI 25.0-29.9) 08/05/2020   Injury of back 08/05/2020     Past Medical History:  Diagnosis Date   Allergy    Anxiety    Arthritis    Asthma    Bipolar depression ([place])    Depression    Diabetes mellitus without complication (HCC)    GERD (gastroesophageal reflux disease)    Hypertension    PTSD (post-traumatic stress disorder)    PTSD (post-traumatic stress disorder)    Thyroid disease      Past Surgical History:  Procedure Laterality Date   ABDOMINAL HYSTERECTOMY     CHOLECYSTECTOMY     TONSILLECTOMY      Social History   Socioeconomic History   Marital status: Married    Spouse name: Not on file  Number of children: Not on file   Years of education: Not on file   Highest education level: Not on file  Occupational History   Not on file  Tobacco Use   Smoking status: Some Days    Types: Cigarettes    Last attempt to quit: 05/25/2020    Years since quitting: 1.3   Smokeless tobacco: Never  Vaping Use   Vaping Use: Never used  Substance and Sexual Activity    Alcohol use: Not Currently   Drug use: Not Currently   Sexual activity: Not Currently    Partners: Female    Comment: married to wife  Other Topics Concern   Not on file  Social History Narrative   Not on file   Social Determinants of Health   Financial Resource Strain: Not on file  Food Insecurity: Not on file  Transportation Needs: Not on file  Physical Activity: Not on file  Stress: Not on file  Social Connections: Not on file  Intimate Partner Violence: Not on file     Family History  Problem Relation Age of Onset   Hypertension Mother    Thyroid disease Mother    Cancer Father 27       carcinoid, right lung   Ulcerative colitis Father    Glaucoma Maternal Grandmother    Renal Disease Maternal Grandfather    Diabetes Maternal Grandfather        Type2   Stroke Paternal Grandfather    Hypertension Paternal Grandfather      Current Outpatient Medications:    albuterol (PROAIR HFA) 108 (90 Base) MCG/ACT inhaler, Inhale 1-2 puffs into the lungs every 6 (six) hours as needed for wheezing., Disp: 8.5 g, Rfl: 1   levothyroxine (SYNTHROID) 50 MCG tablet, TAKE 1 TABLET BY MOUTH DAILY BEFORE BREAKFAST., Disp: 90 tablet, Rfl: 1   loratadine (CLARITIN) 10 MG tablet, Take 10 mg by mouth daily., Disp: , Rfl:    oxybutynin (DITROPAN XL) 10 MG 24 hr tablet, Take 1 tablet (10 mg total) by mouth at bedtime., Disp: 90 tablet, Rfl: 1   pantoprazole (PROTONIX) 40 MG tablet, Take 1 tablet (40 mg total) by mouth daily., Disp: 90 tablet, Rfl: 1   QUEtiapine (SEROQUEL) 300 MG tablet, TAKE 1 TABLET (300 MG TOTAL) BY MOUTH AT BEDTIME., Disp: 90 tablet, Rfl: 1   rosuvastatin (CRESTOR) 5 MG tablet, TAKE 1 TABLET BY MOUTH DAILY., Disp: 90 tablet, Rfl: 1   sertraline (ZOLOFT) 100 MG tablet, TAKE 1 TABLET BY MOUTH DAILY, Disp: 90 tablet, Rfl: 0   traMADol (ULTRAM) 50 MG tablet, Take by mouth every 6 (six) hours as needed. Pt states not effective., Disp: , Rfl:    zolpidem (AMBIEN) 5 MG tablet,  TAKE 1 TABLET (5 MG TOTAL) BY MOUTH AT BEDTIME AS NEEDED FOR SLEEP., Disp: 30 tablet, Rfl: 3   cholecalciferol (VITAMIN D3) 25 MCG (1000 UNIT) tablet, Take 1,000 Units by mouth daily. (Patient not taking: Reported on 09/16/2021), Disp: , Rfl:    nystatin (MYCOSTATIN) 100000 UNIT/ML suspension, Use as needed up to four times daily 5ml in mouth swish hold and spit. (Patient not taking: Reported on 09/16/2021), Disp: 60 mL, Rfl: 0   Omega-3 Fatty Acids (FISH OIL) 1000 MG CAPS, Take by mouth. (Patient not taking: Reported on 09/16/2021), Disp: , Rfl:    predniSONE (DELTASONE) 10 MG tablet, 6 tablets on Day 1 , then reduce by 1 tablet daily until gone (Patient not taking: Reported on 09/16/2021), Disp: 21 tablet, Rfl:  0   Physical exam:  Vitals:   09/16/21 1500  BP: 113/80  Pulse: 68  Resp: 16  Temp: 98.4 F (36.9 C)  SpO2: 99%  Weight: 171 lb 4.8 oz (77.7 kg)  Height: 5' 4.5" (1.638 m)   Physical Exam Constitutional:      General: She is not in acute distress. Cardiovascular:     Rate and Rhythm: Normal rate and regular rhythm.     Heart sounds: Normal heart sounds.  Pulmonary:     Effort: Pulmonary effort is normal.     Breath sounds: Normal breath sounds.  Abdominal:     General: Bowel sounds are normal.     Palpations: Abdomen is soft.     Comments: No palpable hepatosplenomegaly  Musculoskeletal:     Cervical back: Normal range of motion.  Lymphadenopathy:     Comments: No palpable cervical, supraclavicular, axillary or inguinal adenopathy    Skin:    General: Skin is warm and dry.  Neurological:     Mental Status: She is alert and oriented to person, place, and time.       CMP Latest Ref Rng & Units 07/19/2021  Glucose 65 - 99 mg/dL 68  BUN 7 - 25 mg/dL 9  Creatinine 0.50 - 0.99 mg/dL 0.75  Sodium 135 - 146 mmol/L 140  Potassium 3.5 - 5.3 mmol/L 4.2  Chloride 98 - 110 mmol/L 107  CO2 20 - 32 mmol/L 27  Calcium 8.6 - 10.2 mg/dL 9.9  Total Protein 6.1 - 8.1 g/dL  7.0  Total Bilirubin 0.2 - 1.2 mg/dL 0.4  Alkaline Phos 44 - 121 IU/L -  AST 10 - 35 U/L 16  ALT 6 - 29 U/L 15   CBC Latest Ref Rng & Units 09/16/2021  WBC 4.0 - 10.5 K/uL 11.3(H)  Hemoglobin 12.0 - 15.0 g/dL 15.1(H)  Hematocrit 36.0 - 46.0 % 44.4  Platelets 150 - 400 K/uL 395    No images are attached to the encounter.  DG Chest 2 View  Result Date: 09/09/2021 CLINICAL DATA:  Fatigue and skin growth on the left upper back near scapula. EXAM: CHEST - 2 VIEW COMPARISON:  None. FINDINGS: The heart size and mediastinal contours are within normal limits. Both lungs are clear. The visualized skeletal structures are unremarkable. IMPRESSION: No active cardiopulmonary disease. Electronically Signed   By: Kathreen Devoid M.D.   On: 09/09/2021 10:51    Assessment and plan- Patient is a 50 y.o. female referred for leukocytosis/lymphocytosis  Given that patient has had persistent mild lymphocytosis over the last 1 year I will proceed with CBC with differentialAs well as flow cytometry at this time.  She does not have any significant anemia or thrombocytopenia and therefore even if this is CLL she would not require any treatment for this and I do not think that her fatigue and arthralgias are related to her white cell count.  I will also check iron studies B12 and folate to see if it is contributing to her fatigue and anemia.  I will see her back in a couple of weeks for a video visit to discuss further management  Thank you for this kind referral and the opportunity to participate in the care of this patient   Visit Diagnosis 1. Lymphocytosis   2. Other fatigue     Dr. Randa Evens, MD, MPH [facility] [phone] 09/16/2021   11:06 AM

## 2021-09-17 ENCOUNTER — Other Ambulatory Visit: Payer: Self-pay

## 2021-09-17 ENCOUNTER — Encounter: Payer: Self-pay | Admitting: Surgery

## 2021-09-17 ENCOUNTER — Ambulatory Visit (INDEPENDENT_AMBULATORY_CARE_PROVIDER_SITE_OTHER): Payer: Self-pay | Admitting: Surgery

## 2021-09-17 VITALS — BP 113/71 | HR 76 | Temp 98.3°F | Ht 64.5 in | Wt 174.0 lb

## 2021-09-17 DIAGNOSIS — R229 Localized swelling, mass and lump, unspecified: Secondary | ICD-10-CM

## 2021-09-17 DIAGNOSIS — R222 Localized swelling, mass and lump, trunk: Secondary | ICD-10-CM

## 2021-09-17 NOTE — Patient Instructions (Signed)
We recommend just monitoring the area for now. If the area starts growing and becomes bothersome let us know. Or if the area starts causing pain or looks like it is getting infected let us know.  We will have you follow up here in 3 months to look at the area.    Lipoma A lipoma is a noncancerous (benign) tumor that is made up of fat cells. This is a very common type of soft-tissue growth. Lipomas are usually found under the skin (subcutaneous). They may occur in any tissue of the body that contains fat. Common areas for lipomas to appear include the back, arms, shoulders, buttocks, and thighs. Lipomas grow slowly, and they are usually painless. Most lipomas do not cause problems and do not require treatment. What are the causes? The cause of this condition is not known. What increases the risk? You are more likely to develop this condition if: You are 50-50 years old. You have a family history of lipomas. What are the signs or symptoms? A lipoma usually appears as a small, round bump under the skin. In most cases, the lump will: Feel soft or rubbery. Not cause pain or other symptoms. However, if a lipoma is located in an area where it pushes on nerves, it can become painful or cause other symptoms. How is this diagnosed? A lipoma can usually be diagnosed with a physical exam. You may also have tests to confirm the diagnosis and to rule out other conditions. Tests may include: Imaging tests, such as a CT scan or an MRI. Removal of a tissue sample to be looked at under a microscope (biopsy). How is this treated? Treatment for this condition depends on the size of the lipoma and whether it is causing any symptoms. For small lipomas that are not causing problems, no treatment is needed. If a lipoma is bigger or it causes problems, surgery may be done to remove the lipoma. Lipomas can also be removed to improve appearance. Most often, the procedure is done after applying a medicine that numbs  the area (local anesthetic). Liposuction may be done to reduce the size of the lipoma before it is removed through surgery, or it may be done to remove the lipoma. Lipomas are removed with this method in order to limit incision size and scarring. A liposuction tube is inserted through a small incision into the lipoma, and the contents of the lipoma are removed through the tube with suction. Follow these instructions at home: Watch your lipoma for any changes. Keep all follow-up visits as told by your health care provider. This is important. Contact a health care provider if: Your lipoma becomes larger or hard. Your lipoma becomes painful, red, or increasingly swollen. These could be signs of infection or a more serious condition. Get help right away if: You develop tingling or numbness in an area near the lipoma. This could indicate that the lipoma is causing nerve damage. Summary A lipoma is a noncancerous tumor that is made up of fat cells. Most lipomas do not cause problems and do not require treatment. If a lipoma is bigger or it causes problems, surgery may be done to remove the lipoma. Contact a health care provider if your lipoma becomes larger or hard, or if it becomes painful, red, or increasingly swollen. Pain, redness, and swelling could be signs of infection or a more serious condition. This information is not intended to replace advice given to you by your health care provider. Make sure you discuss  any questions you have with your health care provider. Document Revised: 05/30/2019 Document Reviewed: 05/30/2019 Elsevier Patient Education  [place].

## 2021-09-17 NOTE — Progress Notes (Signed)
Patient ID: Kathleen Carr, female   DOB: [birth year], 50 y.o.   MRN: [id number]  Chief Complaint: Bump on back  History of Present Illness Kathleen Carr is a 50 y.o. female with a recently appreciated bump on the left parascapular area.  No overlying skin changes appreciated, has some soreness of the left shoulder musculature/rhomboid area.  No history of any discharge, no appreciable change since it was first appreciated; denies any history of pain specific to the bump.  No antibiotic treatment.  Has some additional abnormalities that are a bit of a conundrum right now with lab work.  Has a family history of cancer and so very hypervigilant, concerned about these more subtle findings.  Past Medical History Past Medical History:  Diagnosis Date   Allergy    Anxiety    Arthritis    Asthma    Bipolar depression ([place])    Depression    Diabetes mellitus without complication (HCC)    GERD (gastroesophageal reflux disease)    Hypertension    PTSD (post-traumatic stress disorder)    PTSD (post-traumatic stress disorder)    Thyroid disease       Past Surgical History:  Procedure Laterality Date   ABDOMINAL HYSTERECTOMY     CHOLECYSTECTOMY     KNEE ARTHROSCOPY Bilateral    TONSILLECTOMY      Allergies  Allergen Reactions   Dust Mite Mixed Allergen Ext [Mite (D. 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- patient reports anaphylactic     Current Outpatient Medications  Medication Sig Dispense Refill   albuterol (PROAIR HFA) 108 (90 Base) MCG/ACT inhaler Inhale 1-2 puffs into the lungs every 6 (six) hours as needed for wheezing. 8.5 g 1   levothyroxine (SYNTHROID) 50 MCG tablet TAKE 1 TABLET BY MOUTH DAILY BEFORE BREAKFAST. 90 tablet 1   loratadine (CLARITIN) 10 MG tablet Take 10 mg by mouth daily.     nystatin (MYCOSTATIN) 100000 UNIT/ML suspension Use as needed up to four  times daily 77ml in mouth swish hold and spit. 60 mL 0   oxybutynin (DITROPAN XL) 10 MG 24 hr tablet Take 1 tablet (10 mg total) by mouth at bedtime. 90 tablet 1   pantoprazole (PROTONIX) 40 MG tablet Take 1 tablet (40 mg total) by mouth daily. 90 tablet 1   QUEtiapine (SEROQUEL) 300 MG tablet TAKE 1 TABLET (300 MG TOTAL) BY MOUTH AT BEDTIME. 90 tablet 1   rosuvastatin (CRESTOR) 5 MG tablet TAKE 1 TABLET BY MOUTH DAILY. 90 tablet 1   sertraline (ZOLOFT) 100 MG tablet TAKE 1 TABLET BY MOUTH DAILY 90 tablet 0   traMADol (ULTRAM) 50 MG tablet Take by mouth every 6 (six) hours as needed. Pt states not effective.     zolpidem (AMBIEN) 5 MG tablet TAKE 1 TABLET (5 MG TOTAL) BY MOUTH AT BEDTIME AS NEEDED FOR SLEEP. 30 tablet 3   cholecalciferol (VITAMIN D3) 25 MCG (1000 UNIT) tablet Take 1,000 Units by mouth daily. (Patient not taking: Reported on 09/16/2021)     No current facility-administered medications for this visit.    Family History Family History  Problem Relation Age of Onset   Hypertension Mother    Thyroid disease Mother    Cancer Father 7       carcinoid, right lung   Ulcerative colitis Father    Glaucoma Maternal  Grandmother    Renal Disease Maternal Grandfather    Diabetes Maternal Grandfather        Type2   Stroke Paternal Grandfather    Hypertension Paternal Grandfather       Social History Social History   Tobacco Use   Smoking status: Some Days    Types: Cigarettes    Last attempt to quit: 05/25/2020    Years since quitting: 1.3   Smokeless tobacco: Never  Vaping Use   Vaping Use: Some days  Substance Use Topics   Alcohol use: Not Currently   Drug use: Not Currently        Review of Systems  Constitutional:  Positive for malaise/fatigue.  HENT: Negative.    Eyes:  Positive for blurred vision.  Respiratory: Negative.    Cardiovascular: Negative.   Gastrointestinal:  Positive for diarrhea and nausea.  Genitourinary:  Positive for frequency and urgency.   Skin:  Positive for itching.  Neurological:  Positive for dizziness, tingling and headaches.  Psychiatric/Behavioral:  Positive for depression. The patient is nervous/anxious.   60 still 6 originally diagnosed no mammo confusing sugars sugars very just would be weird and and  Physical Exam Blood pressure 113/71, pulse 76, temperature 98.3 F (36.8 C), height 5' 4.5" (1.638 m), weight 174 lb (78.9 kg), SpO2 93 %. Last Weight  Most recent update: 09/17/2021  3:45 PM    Weight  78.9 kg (174 lb)             CONSTITUTIONAL: Well developed, and nourished, appropriately responsive and aware without distress.   EYES: Sclera non-icteric.   EARS, NOSE, MOUTH AND THROAT: Mask worn.    Hearing is intact to voice.  NECK: Trachea is midline, and there is no jugular venous distension.  LYMPH NODES:  Lymph nodes in the neck are not appreciated. RESPIRATORY:  Lungs are clear, and breath sounds are equal bilaterally. Normal respiratory effort without pathologic use of accessory muscles. CARDIOVASCULAR: Heart is regular in rate and rhythm. GI: The abdomen is  soft, nontender, and nondistended. There were no palpable masses. MUSCULOSKELETAL:  Symmetrical muscle tone appreciated in all four extremities.    SKIN: Skin turgor is normal. No pathologic skin lesions appreciated.  In the left parascapular area there is about a 2 cm wide flat subcutaneous thickening, there is no change to the overlying dermis, no evidence of punctum, pigment changes or discoloration or erythema.  The subcutaneous density feels like it might be a bit of bruised fat or possibly even a very flat subcutaneous lipoma.  Does not feel at all suspicious from a neoplastic perspective. NEUROLOGIC:  Motor and sensation appear grossly normal.  Cranial nerves are grossly without defect. PSYCH:  Alert and oriented to person, place and time. Affect is appropriate for situation.  Data Reviewed I have personally reviewed what is currently  available of the patient's imaging, recent labs and medical records.   Labs:  CBC Latest Ref Rng & Units 09/16/2021 09/09/2021 08/16/2021  WBC 4.0 - 10.5 K/uL 11.3(H) 8.7 10.7(H)  Hemoglobin 12.0 - 15.0 g/dL 15.1(H) 14.3 13.8  Hematocrit 36.0 - 46.0 % 44.4 42.6 40.9  Platelets 150 - 400 K/uL 395 327.0 327.0   CMP Latest Ref Rng & Units 07/19/2021 01/14/2021 08/02/2020  Glucose 65 - 99 mg/dL 68 97 92  BUN 7 - 25 mg/dL 9 13 13   Creatinine 0.50 - 0.99 mg/dL 0.75 0.86 0.88  Sodium 135 - 146 mmol/L 140 140 137  Potassium 3.5 - 5.3 mmol/L  4.2 4.7 4.7  Chloride 98 - 110 mmol/L 107 103 100  CO2 20 - 32 mmol/L 27 22 22   Calcium 8.6 - 10.2 mg/dL 9.9 10.0 9.8  Total Protein 6.1 - 8.1 g/dL 7.0 7.1 7.2  Total Bilirubin 0.2 - 1.2 mg/dL 0.4 0.3 0.2  Alkaline Phos 44 - 121 IU/L - 81 74  AST 10 - 35 U/L 16 19 18   ALT 6 - 29 U/L 15 21 17       Imaging:  Within last 24 hrs: No results found.  Assessment    Flat subcutaneous lesion consistent with lipomatous etiology.  Not suspicious nor do I believe contributory to other abnormal lab work. Patient Active Problem List   Diagnosis Date Noted   Tick bite of right lower leg 09/09/2021   Skin lumps- left upper back  09/09/2021   Numbness of fingers of both hands 08/20/2021   Bipolar disorder, currently in remission ([place]) 07/21/2021   Controlled diabetes mellitus type 2 with complications ([place]) 53/00/5110   Gastroesophageal reflux disease 03/06/2021   Weight loss counseling, encounter for 02/04/2021   Leukocytosis 01/21/2021   Insomnia 01/21/2021   Fatigue 01/21/2021   Chronic midline low back pain 08/05/2020   Overweight (BMI 25.0-29.9) 08/05/2020   Injury of back 08/05/2020    Plan    We will continue to observe this lesion and I have asked the patient to follow-up in 3 months for a personal chance to reevaluate.  Face-to-face time spent with the patient and accompanying care providers(if present) was 25 minutes, with more than 50% of the  time spent counseling, educating, and coordinating care of the patient.    These notes generated with voice recognition software. I apologize for typographical errors.  Ronny Bacon M.D., FACS 09/17/2021, 4:05 PM

## 2021-09-20 LAB — COMP PANEL: LEUKEMIA/LYMPHOMA

## 2021-09-26 ENCOUNTER — Inpatient Hospital Stay: Payer: Medicaid Other | Admitting: Oncology

## 2021-09-30 ENCOUNTER — Encounter: Payer: Self-pay | Admitting: Adult Health

## 2021-09-30 ENCOUNTER — Other Ambulatory Visit: Payer: Self-pay

## 2021-09-30 ENCOUNTER — Encounter: Payer: Self-pay | Admitting: Oncology

## 2021-09-30 ENCOUNTER — Inpatient Hospital Stay: Payer: Medicaid Other | Attending: Oncology | Admitting: Oncology

## 2021-09-30 DIAGNOSIS — F17210 Nicotine dependence, cigarettes, uncomplicated: Secondary | ICD-10-CM

## 2021-09-30 DIAGNOSIS — R202 Paresthesia of skin: Secondary | ICD-10-CM

## 2021-09-30 DIAGNOSIS — D72820 Lymphocytosis (symptomatic): Secondary | ICD-10-CM

## 2021-09-30 MED ORDER — AMOXICILLIN-POT CLAVULANATE 875-125 MG PO TABS
ORAL_TABLET | ORAL | 0 refills | Status: DC
  Filled 2021-09-30: qty 20, 10d supply, fill #0

## 2021-09-30 NOTE — Progress Notes (Signed)
Yesterday she got cat bite from feral  cat

## 2021-09-30 NOTE — Progress Notes (Signed)
I connected with Kathleen Carr on 09/30/21 at  9:15 AM EST by video enabled telemedicine visit and verified that I am speaking with the correct person using two identifiers.   I discussed the limitations, risks, security and privacy concerns of performing an evaluation and management service by telemedicine and the availability of in-person appointments. I also discussed with the patient that there may be a patient responsible charge related to this service. The patient expressed understanding and agreed to proceed.  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  Patient has had consistent lymphocytosis at least dating back to October 2021.  At that time her white count was 10.1 with an absolute lymphocyte count of 5.3.  Since then her white cell count is fluctuated between 8-15.  Most recent CBC showed a white count of 8.7, H&H of 14.3/42.6 with a platelet count of 327 and an absolute lymphocyte count of 4.  Patient reports ongoing fatigue.   Results of blood work from 09/16/2021 showed a white cell count of 11.3 with a lymphocytosis of 5.1.  Hemoglobin and platelets were normal.  Flow cytometry did not show any immunophenotypic abnormality.  Multiple lymphocyte subsets elevated suggestive of a reactive process  Interval history patient has ongoing fatigue but denies any new complaints at this time.   Review of Systems  Constitutional:  Positive for malaise/fatigue. Negative for chills, fever and weight loss.  HENT:  Negative for congestion, ear discharge and nosebleeds.   Eyes:  Negative for blurred vision.  Respiratory:  Negative for cough, hemoptysis, sputum  production, shortness of breath and wheezing.   Cardiovascular:  Negative for chest pain, palpitations, orthopnea and claudication.  Gastrointestinal:  Negative for abdominal pain, blood in stool, constipation, diarrhea, heartburn, melena, nausea and vomiting.  Genitourinary:  Negative for dysuria, flank pain, frequency, hematuria and urgency.  Musculoskeletal:  Negative for back pain, joint pain and myalgias.  Skin:  Negative for rash.  Neurological:  Negative for dizziness, tingling, focal weakness, seizures, weakness and headaches.  Endo/Heme/Allergies:  Does not bruise/bleed easily.  Psychiatric/Behavioral:  Negative for depression and suicidal ideas. The patient does not have insomnia.    Allergies  Allergen Reactions   Dust Mite Mixed Allergen Ext [Mite (D. 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- patient reports anaphylactic     Past Medical History:  Diagnosis Date   Allergy    Anxiety    Arthritis    Asthma    Bipolar depression ([place])    Depression    Diabetes mellitus without complication (HCC)    GERD (gastroesophageal reflux disease)    Hypertension    PTSD (post-traumatic stress disorder)    PTSD (post-traumatic stress disorder)    Thyroid disease     Past Surgical History:  Procedure Laterality Date   ABDOMINAL HYSTERECTOMY     CHOLECYSTECTOMY     KNEE ARTHROSCOPY Bilateral    TONSILLECTOMY      Social History   Socioeconomic History   Marital status: Married    Spouse name: Not on file  Number of children: Not on file   Years of education: Not on file   Highest education level: Not on file  Occupational History   Not on file  Tobacco Use   Smoking status: Some Days    Types: Cigarettes    Last attempt to quit: 05/25/2020    Years since quitting: 1.3   Smokeless tobacco: Never  Vaping Use   Vaping Use: Some days  Substance and  Sexual Activity   Alcohol use: Not Currently   Drug use: Not Currently   Sexual activity: Not Currently    Partners: Female    Comment: married to wife  Other Topics Concern   Not on file  Social History Narrative   Not on file   Social Determinants of Health   Financial Resource Strain: Not on file  Food Insecurity: Not on file  Transportation Needs: Not on file  Physical Activity: Not on file  Stress: Not on file  Social Connections: Not on file  Intimate Partner Violence: Not on file    Family History  Problem Relation Age of Onset   Hypertension Mother    Thyroid disease Mother    Cancer Father 42       carcinoid, right lung   Ulcerative colitis Father    Glaucoma Maternal Grandmother    Renal Disease Maternal Grandfather    Diabetes Maternal Grandfather        Type2   Stroke Paternal Grandfather    Hypertension Paternal Grandfather      Current Outpatient Medications:    albuterol (PROAIR HFA) 108 (90 Base) MCG/ACT inhaler, Inhale 1-2 puffs into the lungs every 6 (six) hours as needed for wheezing., Disp: 8.5 g, Rfl: 1   levothyroxine (SYNTHROID) 50 MCG tablet, TAKE 1 TABLET BY MOUTH DAILY BEFORE BREAKFAST., Disp: 90 tablet, Rfl: 1   loratadine (CLARITIN) 10 MG tablet, Take 10 mg by mouth daily., Disp: , Rfl:    oxybutynin (DITROPAN XL) 10 MG 24 hr tablet, Take 1 tablet (10 mg total) by mouth at bedtime., Disp: 90 tablet, Rfl: 1   pantoprazole (PROTONIX) 40 MG tablet, Take 1 tablet (40 mg total) by mouth daily., Disp: 90 tablet, Rfl: 1   QUEtiapine (SEROQUEL) 300 MG tablet, TAKE 1 TABLET (300 MG TOTAL) BY MOUTH AT BEDTIME., Disp: 90 tablet, Rfl: 1   rosuvastatin (CRESTOR) 5 MG tablet, TAKE 1 TABLET BY MOUTH DAILY., Disp: 90 tablet, Rfl: 1   sertraline (ZOLOFT) 100 MG tablet, TAKE 1 TABLET BY MOUTH DAILY, Disp: 90 tablet, Rfl: 0   traMADol (ULTRAM) 50 MG tablet, Take by mouth every 6 (six) hours as needed. Pt states not effective., Disp: , Rfl:    zolpidem (AMBIEN)  5 MG tablet, TAKE 1 TABLET (5 MG TOTAL) BY MOUTH AT BEDTIME AS NEEDED FOR SLEEP., Disp: 30 tablet, Rfl: 3   cholecalciferol (VITAMIN D3) 25 MCG (1000 UNIT) tablet, Take 1,000 Units by mouth daily. (Patient not taking: Reported on 09/16/2021), Disp: , Rfl:    nystatin (MYCOSTATIN) 100000 UNIT/ML suspension, Use as needed up to four times daily 16ml in mouth swish hold and spit. (Patient not taking: Reported on 09/30/2021), Disp: 60 mL, Rfl: 0  DG Chest 2 View  Result Date: 09/09/2021 CLINICAL DATA:  Fatigue and skin growth on the left upper back near scapula. EXAM: CHEST - 2 VIEW COMPARISON:  None. FINDINGS: The heart size and mediastinal contours are within normal limits. Both lungs are clear. The visualized skeletal structures are unremarkable. IMPRESSION: No active  cardiopulmonary disease. Electronically Signed   By: Kathreen Devoid M.D.   On: 09/09/2021 10:51    No images are attached to the encounter.   CMP Latest Ref Rng & Units 07/19/2021  Glucose 65 - 99 mg/dL 68  BUN 7 - 25 mg/dL 9  Creatinine 0.50 - 0.99 mg/dL 0.75  Sodium 135 - 146 mmol/L 140  Potassium 3.5 - 5.3 mmol/L 4.2  Chloride 98 - 110 mmol/L 107  CO2 20 - 32 mmol/L 27  Calcium 8.6 - 10.2 mg/dL 9.9  Total Protein 6.1 - 8.1 g/dL 7.0  Total Bilirubin 0.2 - 1.2 mg/dL 0.4  Alkaline Phos 44 - 121 IU/L -  AST 10 - 35 U/L 16  ALT 6 - 29 U/L 15   CBC Latest Ref Rng & Units 09/16/2021  WBC 4.0 - 10.5 K/uL 11.3(H)  Hemoglobin 12.0 - 15.0 g/dL 15.1(H)  Hematocrit 36.0 - 46.0 % 44.4  Platelets 150 - 400 K/uL 395     Observation/objective: Appears in no acute distress over video visit today.  Breathing is nonlabored  Assessment and plan: Patient is a 50 year old female referred for leukocytosis/lymphocytosis likely reactive  Discussed results of flow cytometry which did not show any monoclonal lymphocytosis that would be suggestive of CLL.  Increase in multiple lymphocyte subsets rate suggestive for reactive process.  This is not  suggestive of any primary hematologic disorder.  I will recheck her CBC again in 6 months and if there is no consistent increase in her white cell count/lymphocytosis she does not require any further follow-up with me at that time  Follow-up instructions: As above  I discussed the assessment and treatment plan with the patient. The patient was provided an opportunity to ask questions and all were answered. The patient agreed with the plan and demonstrated an understanding of the instructions.   The patient was advised to call back or seek an in-person evaluation if the symptoms worsen or if the condition fails to improve as anticipated.   Visit Diagnosis: 1. Lymphocytosis     Dr. Randa Evens, MD, MPH [facility] at [facility] Tel- [phone] 09/30/2021 12:30 PM

## 2021-10-01 ENCOUNTER — Emergency Department
Admission: EM | Admit: 2021-10-01 | Discharge: 2021-10-01 | Disposition: A | Discharge status: Home / Self Care | Payer: Medicaid Other | Attending: Emergency Medicine | Admitting: Emergency Medicine

## 2021-10-01 ENCOUNTER — Emergency Department: Payer: Medicaid Other

## 2021-10-01 ENCOUNTER — Encounter: Payer: Medicaid Other | Admitting: Neurology

## 2021-10-01 ENCOUNTER — Other Ambulatory Visit: Payer: Self-pay

## 2021-10-01 DIAGNOSIS — F17210 Nicotine dependence, cigarettes, uncomplicated: Secondary | ICD-10-CM | POA: Insufficient documentation

## 2021-10-01 DIAGNOSIS — Z79899 Other long term (current) drug therapy: Secondary | ICD-10-CM | POA: Insufficient documentation

## 2021-10-01 DIAGNOSIS — W5501XA Bitten by cat, initial encounter: Secondary | ICD-10-CM | POA: Insufficient documentation

## 2021-10-01 DIAGNOSIS — L03011 Cellulitis of right finger: Secondary | ICD-10-CM | POA: Insufficient documentation

## 2021-10-01 DIAGNOSIS — L02511 Cutaneous abscess of right hand: Secondary | ICD-10-CM | POA: Insufficient documentation

## 2021-10-01 DIAGNOSIS — Z7984 Long term (current) use of oral hypoglycemic drugs: Secondary | ICD-10-CM | POA: Insufficient documentation

## 2021-10-01 DIAGNOSIS — E119 Type 2 diabetes mellitus without complications: Secondary | ICD-10-CM | POA: Insufficient documentation

## 2021-10-01 DIAGNOSIS — J45909 Unspecified asthma, uncomplicated: Secondary | ICD-10-CM | POA: Insufficient documentation

## 2021-10-01 DIAGNOSIS — I10 Essential (primary) hypertension: Secondary | ICD-10-CM | POA: Insufficient documentation

## 2021-10-01 LAB — CBC
HCT: 40.9 % (ref 36.0–46.0)
Hemoglobin: 14 g/dL (ref 12.0–15.0)
MCH: 32.2 pg (ref 26.0–34.0)
MCHC: 34.2 g/dL (ref 30.0–36.0)
MCV: 94 fL (ref 80.0–100.0)
Platelets: 359 10*3/uL (ref 150–400)
RBC: 4.35 MIL/uL (ref 3.87–5.11)
RDW: 12 % (ref 11.5–15.5)
WBC: 12.5 10*3/uL — ABNORMAL HIGH (ref 4.0–10.5)
nRBC: 0 % (ref 0.0–0.2)

## 2021-10-01 LAB — BASIC METABOLIC PANEL
Anion gap: 6 (ref 5–15)
BUN: 14 mg/dL (ref 6–20)
CO2: 27 mmol/L (ref 22–32)
Calcium: 9.4 mg/dL (ref 8.9–10.3)
Chloride: 104 mmol/L (ref 98–111)
Creatinine, Ser: 0.79 mg/dL (ref 0.44–1.00)
GFR, Estimated: >60 mL/min (ref >60)
Glucose, Bld: 118 mg/dL — ABNORMAL HIGH (ref 70–99)
Potassium: 3.8 mmol/L (ref 3.5–5.1)
Sodium: 137 mmol/L (ref 135–145)

## 2021-10-01 IMAGING — DX DG FINGER INDEX 2+V*R*
3 series · 3 of 3 positions shown · non-contrast
Comparison: None.

CLINICAL DATA: Cat bite.

EXAM:
RIGHT INDEX FINGER 2+V

[finger ap]
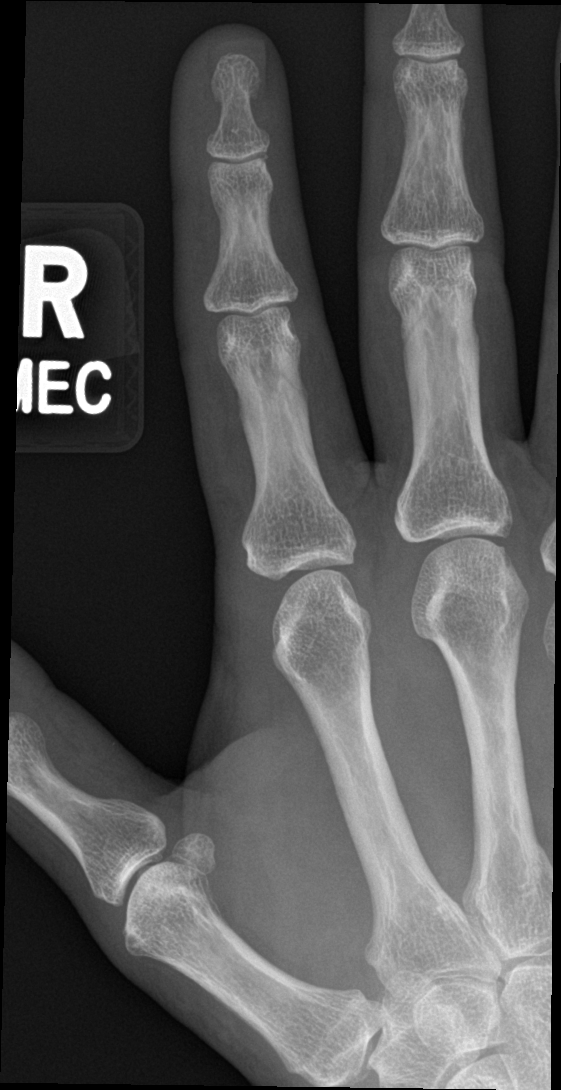

[finger obl]
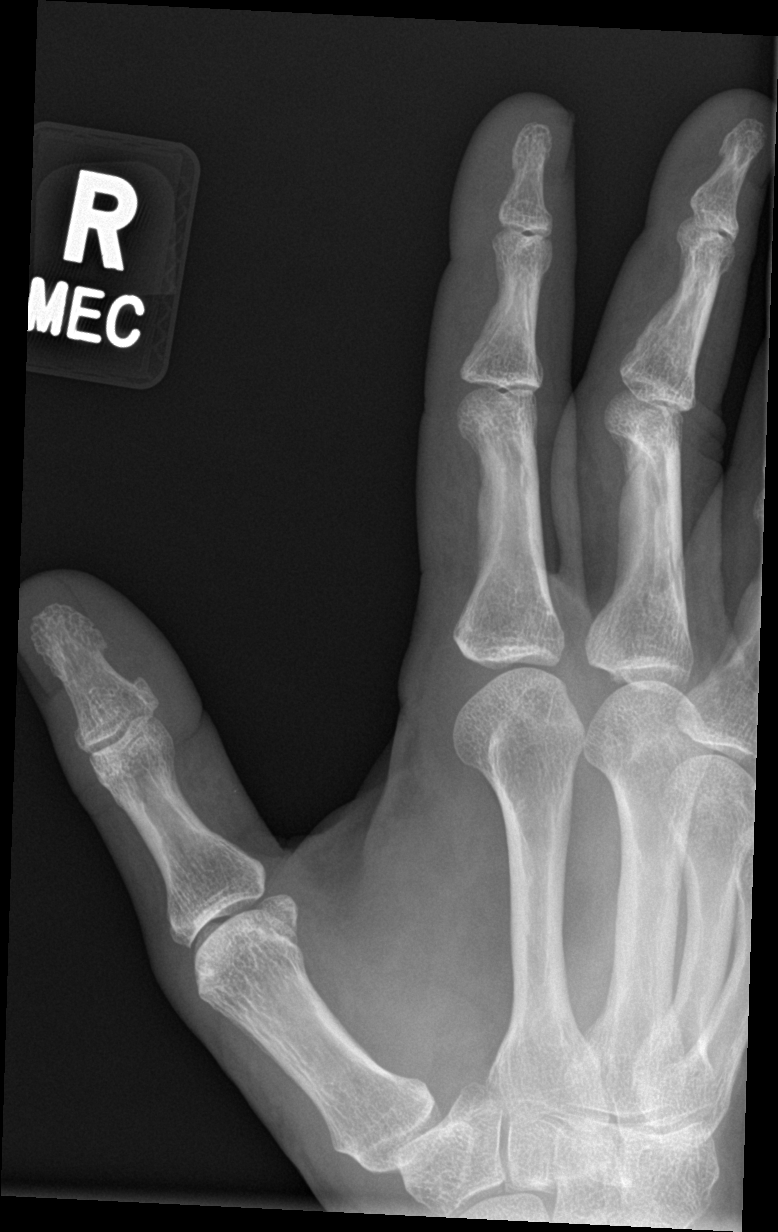

[finger lat]
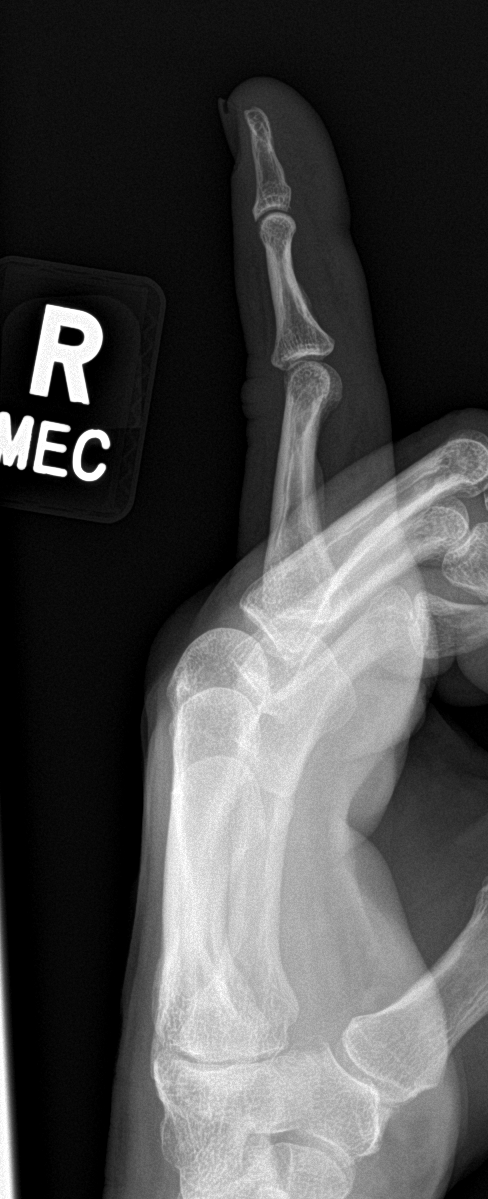

[3 of 3 positions shown; findings below may reference images not displayed]

FINDINGS: There is no evidence of fracture or dislocation. There is no
evidence of arthropathy or other focal bone abnormality. Soft
tissues are unremarkable.
IMPRESSION: Negative.

## 2021-10-01 MED ORDER — PIPERACILLIN-TAZOBACTAM 3.375 G IVPB 30 MIN
3.3750 g | Freq: Once | INTRAVENOUS | Status: AC
  Administered 2021-10-01: 3.375 g via INTRAVENOUS
  Filled 2021-10-01: qty 50

## 2021-10-01 MED ORDER — OXYCODONE HCL 5 MG PO TABS: 5.0000 mg | ORAL_TABLET | ORAL | 0 refills | Status: DC | PRN

## 2021-10-01 MED ORDER — MORPHINE SULFATE (PF) 4 MG/ML IV SOLN
4.0000 mg | Freq: Once | INTRAVENOUS | Status: AC
  Administered 2021-10-01: 4 mg via INTRAVENOUS
  Filled 2021-10-01: qty 1

## 2021-10-01 NOTE — ED Notes (Addendum)
Pt has wound to R index finger from cat bite. Pt states she was bitten by a feral kitten on Sunday (2 days ago). Pain radiates down R hand & is throbbing. Pt reports last tetanus shot was approx. 2 yrs ago.

## 2021-10-01 NOTE — ED Provider Notes (Signed)
[place] EMERGENCY DEPARTMENT Provider Note   CSN: [id number] Arrival date & time: 10/01/21  1653     History Chief Complaint  Patient presents with   Animal Bite    Kathleen Carr is a 50 y.o. female presents to the emergency department for evaluation of cat bite to the right index finger along the volar aspect of the DIP joint 2 days ago.  She started developing some pain and swelling and was seen yesterday afternoon and started on Augmentin at an urgent care facility.  Her tetanus is up-to-date.  She is only had 2 doses of Augmentin but is noticing worsening pain and swelling.  Swelling is beginning to track down to the proximal phalanx and she has pain with flexion.  No numbness or tingling.  Patient is diabetic.  HPI     Past Medical History:  Diagnosis Date   Allergy    Anxiety    Arthritis    Asthma    Bipolar depression ([place])    Depression    Diabetes mellitus without complication (HCC)    GERD (gastroesophageal reflux disease)    Hypertension    PTSD (post-traumatic stress disorder)    PTSD (post-traumatic stress disorder)    Thyroid disease     Patient Active Problem List   Diagnosis Date Noted   Tick bite of right lower leg 09/09/2021   Skin lumps- left upper back  09/09/2021   Numbness of fingers of both hands 08/20/2021   Bipolar disorder, currently in remission ([place]) 07/21/2021   Controlled diabetes mellitus type 2 with complications ([place]) 16/10/930   Gastroesophageal reflux disease 03/06/2021   Weight loss counseling, encounter for 02/04/2021   Leukocytosis 01/21/2021   Insomnia 01/21/2021   Fatigue 01/21/2021   Chronic midline low back pain 08/05/2020   Overweight (BMI 25.0-29.9) 08/05/2020   Injury of back 08/05/2020    Past Surgical History:  Procedure Laterality Date   ABDOMINAL HYSTERECTOMY     CHOLECYSTECTOMY     KNEE ARTHROSCOPY Bilateral    TONSILLECTOMY       OB History   No obstetric history on file.      Family History  Problem Relation Age of Onset   Hypertension Mother    Thyroid disease Mother    Cancer Father 85       carcinoid, right lung   Ulcerative colitis Father    Glaucoma Maternal Grandmother    Renal Disease Maternal Grandfather    Diabetes Maternal Grandfather        Type2   Stroke Paternal Grandfather    Hypertension Paternal Grandfather     Social History   Tobacco Use   Smoking status: Some Days    Types: Cigarettes    Last attempt to quit: 05/25/2020    Years since quitting: 1.3   Smokeless tobacco: Never  Vaping Use   Vaping Use: Some days  Substance Use Topics   Alcohol use: Not Currently   Drug use: Not Currently    Home Medications Prior to Admission medications   Medication Sig Start Date End Date Taking? Authorizing Provider  oxyCODONE (ROXICODONE) 5 MG immediate release tablet Take 1 tablet (5 mg total) by mouth every 4 (four) hours as needed. 10/01/21 10/01/22 Yes Duanne Guess, PA-C  albuterol (PROAIR HFA) 108 (90 Base) MCG/ACT inhaler Inhale 1-2 puffs into the lungs every 6 (six) hours as needed for wheezing. 03/18/21   Flinchum, Kelby Aline, FNP  amoxicillin-clavulanate (AUGMENTIN) 875-125 MG tablet Take 1  tablet by mouth every 12 (twelve) hours for 10 days. 09/30/21     cholecalciferol (VITAMIN D3) 25 MCG (1000 UNIT) tablet Take 1,000 Units by mouth daily. Patient not taking: Reported on 09/16/2021    [provider]  levothyroxine (SYNTHROID) 50 MCG tablet TAKE 1 TABLET BY MOUTH DAILY BEFORE BREAKFAST. 08/20/21 08/20/22  Crecencio Mc, MD  loratadine (CLARITIN) 10 MG tablet Take 10 mg by mouth daily.    [provider]  nystatin (MYCOSTATIN) 100000 UNIT/ML suspension Use as needed up to four times daily 57ml in mouth swish hold and spit. Patient not taking: Reported on 09/30/2021 02/05/21   Flinchum, Kelby Aline, FNP  oxybutynin (DITROPAN XL) 10 MG 24 hr tablet Take 1 tablet (10 mg total) by mouth at bedtime. 08/20/21   Crecencio Mc, MD  pantoprazole (PROTONIX) 40 MG tablet Take 1 tablet (40 mg total) by mouth daily. 08/20/21 08/20/22  Crecencio Mc, MD  QUEtiapine (SEROQUEL) 300 MG tablet TAKE 1 TABLET (300 MG TOTAL) BY MOUTH AT BEDTIME. 05/10/21 05/10/22  Dutch Quint B, FNP  rosuvastatin (CRESTOR) 5 MG tablet TAKE 1 TABLET BY MOUTH DAILY. 08/20/21 08/20/22  Crecencio Mc, MD  sertraline (ZOLOFT) 100 MG tablet TAKE 1 TABLET BY MOUTH DAILY 08/12/21 08/12/22  Dutch Quint B, FNP  traMADol (ULTRAM) 50 MG tablet Take by mouth every 6 (six) hours as needed. Pt states not effective.    [provider]  zolpidem (AMBIEN) 5 MG tablet TAKE 1 TABLET (5 MG TOTAL) BY MOUTH AT BEDTIME AS NEEDED FOR SLEEP. 07/19/21 01/15/22  Crecencio Mc, MD  sitaGLIPtin (JANUVIA) 100 MG tablet Take 1 tablet (100 mg total) by mouth daily. 08/08/20 01/22/21  Flinchum, Kelby Aline, FNP    Allergies    Dust mite mixed allergen ext [mite (d. farinae)], Sulfa antibiotics, and Other  Review of Systems   Review of Systems  Constitutional:  Negative for chills and fever.  Musculoskeletal:  Positive for arthralgias and joint swelling.  Skin:  Positive for wound. Negative for color change and rash.   Physical Exam Updated Vital Signs BP 131/74 (BP Location: Left Arm)   Pulse 68   Temp 97.9 F (36.6 C) (Oral)   Resp 17   Ht 5\' 4"  (1.626 m)   Wt 78.5 kg   SpO2 96%   BMI 29.70 kg/m   Physical Exam Constitutional:      Appearance: She is well-developed.  HENT:     Head: Normocephalic and atraumatic.  Eyes:     Conjunctiva/sclera: Conjunctivae normal.  Cardiovascular:     Rate and Rhythm: Normal rate.  Pulmonary:     Effort: Pulmonary effort is normal. No respiratory distress.  Musculoskeletal:     Cervical back: Normal range of motion.     Comments: Right index finger with small fluctuant superficial abscess along the volar aspect of the DIP joint measuring 0.5 cm in diameter.  There is minimal swelling throughout the  proximal phalanx.  No significant swelling throughout the digit.  Minimal erythema along the volar aspect of DIP joint.  She has some pain with flexion of the DIP joint but is able to flex the MCP joint to 45 degrees before having pain.  Nontender along the proximal phalanx but a little bit of tenderness along the volar aspect of the middle phalanx.  Skin:    General: Skin is warm.     Findings: No rash.  Neurological:     General: No focal deficit present.  Mental Status: She is alert and oriented to person, place, and time.  Psychiatric:        Behavior: Behavior normal.        Thought Content: Thought content normal.    ED Results / Procedures / Treatments   Labs (all labs ordered are listed, but only abnormal results are displayed) Labs Reviewed  CBC - Abnormal; Notable for the following components:      Result Value   WBC 12.5 (*)    All other components within normal limits  BASIC METABOLIC PANEL - Abnormal; Notable for the following components:   Glucose, Bld 118 (*)    All other components within normal limits  AEROBIC/ANAEROBIC CULTURE W GRAM STAIN (SURGICAL/DEEP WOUND)    EKG None  Radiology DG Finger Index Right  Result Date: 10/01/2021 CLINICAL DATA:  Cat bite. EXAM: RIGHT INDEX FINGER 2+V COMPARISON:  None. FINDINGS: There is no evidence of fracture or dislocation. There is no evidence of arthropathy or other focal bone abnormality. Soft tissues are unremarkable. IMPRESSION: Negative. Electronically Signed   By: Anner Crete M.D.   On: 10/01/2021 18:29    Procedures .Marland KitchenIncision and Drainage  Date/Time: 10/01/2021 7:56 PM Performed by: Duanne Guess, PA-C Authorized by: Duanne Guess, PA-C   Consent:    Consent obtained:  Verbal   Consent given by:  Patient   Risks discussed:  Infection Location:    Type:  Abscess Pre-procedure details:    Skin preparation:  Chlorhexidine with alcohol Procedure type:    Complexity:  Simple Procedure details:     Incision depth:  Dermal (Dear proofed with 25-gauge needle)   Wound management:  Irrigated with saline   Drainage:  Purulent   Drainage amount:  Scant   Wound treatment:  Wound left open Post-procedure details:    Procedure completion:  Tolerated   Medications Ordered in ED Medications  morphine 4 MG/ML injection 4 mg (4 mg Intravenous Given 10/01/21 1901)  piperacillin-tazobactam (ZOSYN) IVPB 3.375 g (3.375 g Intravenous New Bag/Given 10/01/21 1919)  morphine 4 MG/ML injection 4 mg (4 mg Intravenous Given 10/01/21 1947)    ED Course  I have reviewed the triage vital signs and the nursing notes.  Pertinent labs & imaging results that were available during my care of the patient were reviewed by me and considered in my medical decision making (see chart for details).    MDM Rules/Calculators/A&P                         50 year old female with cat bite to the right index finger.  Small fluctuant abscess along the DIP joint, this was deroofed with a 25-gauge needle and a small 1 to 2 cc amount of pus was removed, this was cultured.  Patient's pain did improve following this procedure.  Physical exam did not show a infectious tenosynovitis but due to mechanism of injury, there is potential for this to progress.  She was given a dose of IV Zosyn and will continue with oral Augmentin.  She will soak finger in half peroxide and warm water as much as possible.  Discussed case with orthopedist who was in agreement with treatment plan and recommended follow-up tomorrow in the clinic.  Patient understands signs and symptoms to return to the ER for. Final Clinical Impression(s) / ED Diagnoses Final diagnoses:  Cat bite, initial encounter  Cellulitis of right index finger    Rx / DC Orders ED Discharge Orders  Ordered    oxyCODONE (ROXICODONE) 5 MG immediate release tablet  Every 4 hours PRN        10/01/21 1945             Renata Caprice 10/01/21 Daphene Calamity, MD 10/02/21 2031

## 2021-10-01 NOTE — ED Triage Notes (Signed)
Pt comes with c/o cat bite. Pt states she was seen at [facility] and prescribed antibiotics. Pt states she thinks it is getting worse.

## 2021-10-01 NOTE — Discharge Instructions (Signed)
Please continue with oral antibiotics as prescribed.  Take oxycodone for breakthrough pain.  Please call orthopedic office first thing tomorrow morning to schedule a follow-up appointment tomorrow.  Dr. Harlow Mares would like to see you tomorrow morning.  In the meantime, soak right index finger and half peroxide as much as possible.  Return to the ER for any increasing pain swelling warmth redness or fevers.

## 2021-10-02 ENCOUNTER — Other Ambulatory Visit: Payer: Self-pay

## 2021-10-02 ENCOUNTER — Encounter: Payer: Self-pay | Admitting: Adult Health

## 2021-10-02 MED ORDER — DOXYCYCLINE HYCLATE 100 MG PO CAPS
ORAL_CAPSULE | ORAL | 0 refills | Status: DC
  Filled 2021-10-02: qty 20, 10d supply, fill #0

## 2021-10-03 ENCOUNTER — Encounter: Payer: Medicaid Other | Admitting: Neurology

## 2021-10-04 ENCOUNTER — Encounter: Payer: Self-pay | Admitting: Emergency Medicine

## 2021-10-04 ENCOUNTER — Other Ambulatory Visit: Payer: Self-pay

## 2021-10-04 ENCOUNTER — Emergency Department: Payer: Self-pay

## 2021-10-04 ENCOUNTER — Emergency Department
Admission: EM | Admit: 2021-10-04 | Discharge: 2021-10-04 | Disposition: A | Discharge status: Home / Self Care | Payer: Self-pay | Attending: Emergency Medicine | Admitting: Emergency Medicine

## 2021-10-04 DIAGNOSIS — E119 Type 2 diabetes mellitus without complications: Secondary | ICD-10-CM | POA: Insufficient documentation

## 2021-10-04 DIAGNOSIS — I10 Essential (primary) hypertension: Secondary | ICD-10-CM | POA: Insufficient documentation

## 2021-10-04 DIAGNOSIS — Z7984 Long term (current) use of oral hypoglycemic drugs: Secondary | ICD-10-CM | POA: Insufficient documentation

## 2021-10-04 DIAGNOSIS — S61250A Open bite of right index finger without damage to nail, initial encounter: Secondary | ICD-10-CM | POA: Insufficient documentation

## 2021-10-04 DIAGNOSIS — W5501XD Bitten by cat, subsequent encounter: Secondary | ICD-10-CM

## 2021-10-04 DIAGNOSIS — Z23 Encounter for immunization: Secondary | ICD-10-CM | POA: Insufficient documentation

## 2021-10-04 DIAGNOSIS — Z79899 Other long term (current) drug therapy: Secondary | ICD-10-CM | POA: Insufficient documentation

## 2021-10-04 DIAGNOSIS — J45909 Unspecified asthma, uncomplicated: Secondary | ICD-10-CM | POA: Insufficient documentation

## 2021-10-04 DIAGNOSIS — Z2914 Encounter for prophylactic rabies immune globin: Secondary | ICD-10-CM | POA: Insufficient documentation

## 2021-10-04 DIAGNOSIS — W5501XA Bitten by cat, initial encounter: Secondary | ICD-10-CM | POA: Insufficient documentation

## 2021-10-04 DIAGNOSIS — F17210 Nicotine dependence, cigarettes, uncomplicated: Secondary | ICD-10-CM | POA: Insufficient documentation

## 2021-10-04 LAB — COMPREHENSIVE METABOLIC PANEL
ALT: 21 U/L (ref 0–44)
AST: 24 U/L (ref 15–41)
Albumin: 4.3 g/dL (ref 3.5–5.0)
Alkaline Phosphatase: 65 U/L (ref 38–126)
Anion gap: 9 (ref 5–15)
BUN: 11 mg/dL (ref 6–20)
CO2: 27 mmol/L (ref 22–32)
Calcium: 9.5 mg/dL (ref 8.9–10.3)
Chloride: 100 mmol/L (ref 98–111)
Creatinine, Ser: 0.73 mg/dL (ref 0.44–1.00)
GFR, Estimated: >60 mL/min (ref >60)
Glucose, Bld: 123 mg/dL — ABNORMAL HIGH (ref 70–99)
Potassium: 3.7 mmol/L (ref 3.5–5.1)
Sodium: 136 mmol/L (ref 135–145)
Total Bilirubin: 0.5 mg/dL (ref 0.3–1.2)
Total Protein: 7.3 g/dL (ref 6.5–8.1)

## 2021-10-04 LAB — URINALYSIS, ROUTINE W REFLEX MICROSCOPIC
Bacteria, UA: NONE SEEN
Bilirubin Urine: NEGATIVE
Glucose, UA: NEGATIVE mg/dL
Ketones, ur: NEGATIVE mg/dL
Leukocytes,Ua: NEGATIVE
Nitrite: NEGATIVE
Protein, ur: NEGATIVE mg/dL
Specific Gravity, Urine: 1.011 (ref 1.005–1.030)
pH: 6 (ref 5.0–8.0)

## 2021-10-04 LAB — CBC WITH DIFFERENTIAL/PLATELET
Abs Immature Granulocytes: 0.08 10*3/uL — ABNORMAL HIGH (ref 0.00–0.07)
Basophils Absolute: 0.1 10*3/uL (ref 0.0–0.1)
Basophils Relative: 1 %
Eosinophils Absolute: 0.2 10*3/uL (ref 0.0–0.5)
Eosinophils Relative: 1 %
HCT: 42.6 % (ref 36.0–46.0)
Hemoglobin: 14.5 g/dL (ref 12.0–15.0)
Immature Granulocytes: 1 %
Lymphocytes Relative: 41 %
Lymphs Abs: 5.2 10*3/uL — ABNORMAL HIGH (ref 0.7–4.0)
MCH: 31.7 pg (ref 26.0–34.0)
MCHC: 34 g/dL (ref 30.0–36.0)
MCV: 93 fL (ref 80.0–100.0)
Monocytes Absolute: 0.6 10*3/uL (ref 0.1–1.0)
Monocytes Relative: 5 %
Neutro Abs: 6.7 10*3/uL (ref 1.7–7.7)
Neutrophils Relative %: 51 %
Platelets: 374 10*3/uL (ref 150–400)
RBC: 4.58 MIL/uL (ref 3.87–5.11)
RDW: 12 % (ref 11.5–15.5)
WBC: 12.8 10*3/uL — ABNORMAL HIGH (ref 4.0–10.5)
nRBC: 0 % (ref 0.0–0.2)

## 2021-10-04 LAB — LACTIC ACID, PLASMA
Lactic Acid, Venous: 2.7 mmol/L (ref 0.5–1.9)
Lactic Acid, Venous: 1.7 mmol/L (ref 0.5–1.9)

## 2021-10-04 IMAGING — CT CT HAND*R* W/CM
3 of 4 series · 8 of 20 positions shown, 10 images · IV contrast (omnipaque)
Comparison: [DATE]

CLINICAL DATA: Cat bite 3 days ago to index finger, worsening
infection despite antibiotic therapy

EXAM:
CT OF THE UPPER RIGHT EXTREMITY WITH CONTRAST
TECHNIQUE: Multidetector CT imaging of the upper right extremity was performed
according to the standard protocol following intravenous contrast
administration.
CONTRAST:  100mL OMNIPAQUE IOHEXOL 300 MG/ML  SOLN

[Series 4: axial bone · axial · 0.16mm/px · z∈[-131,-14]mm · 5 of 218 slices shown, 7 images]
[im 37/218  soft-tissue]
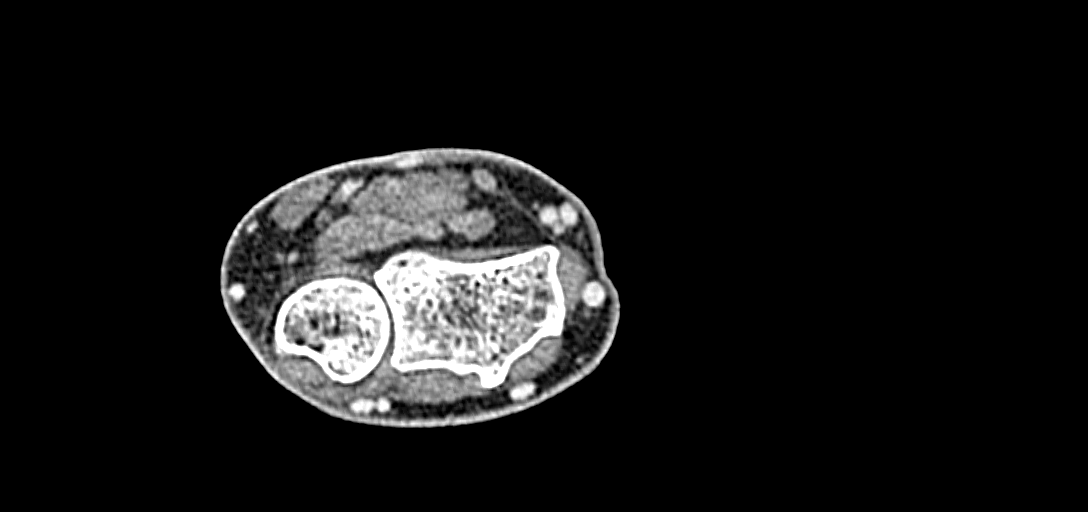
[im 37/218  bone]
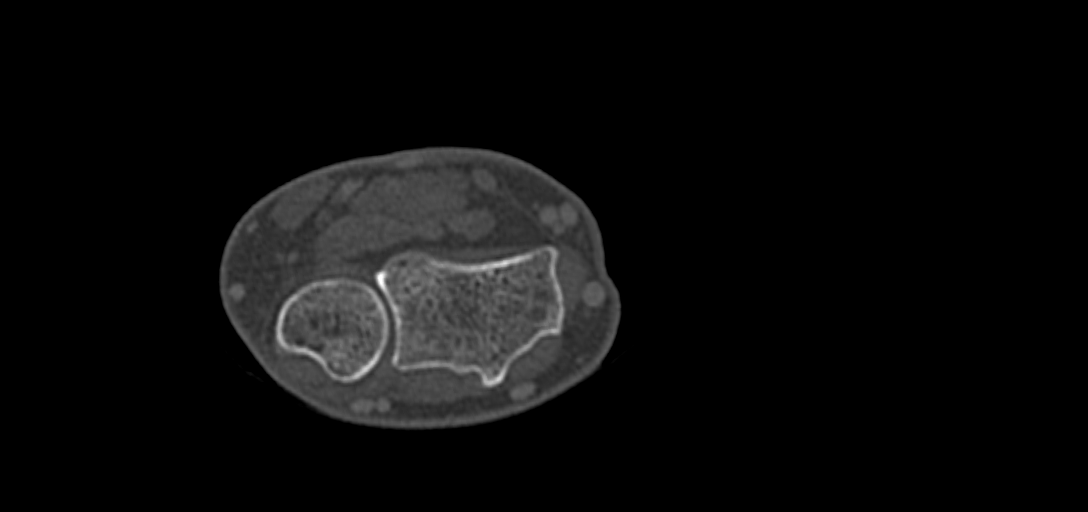
[im 73/218  bone]
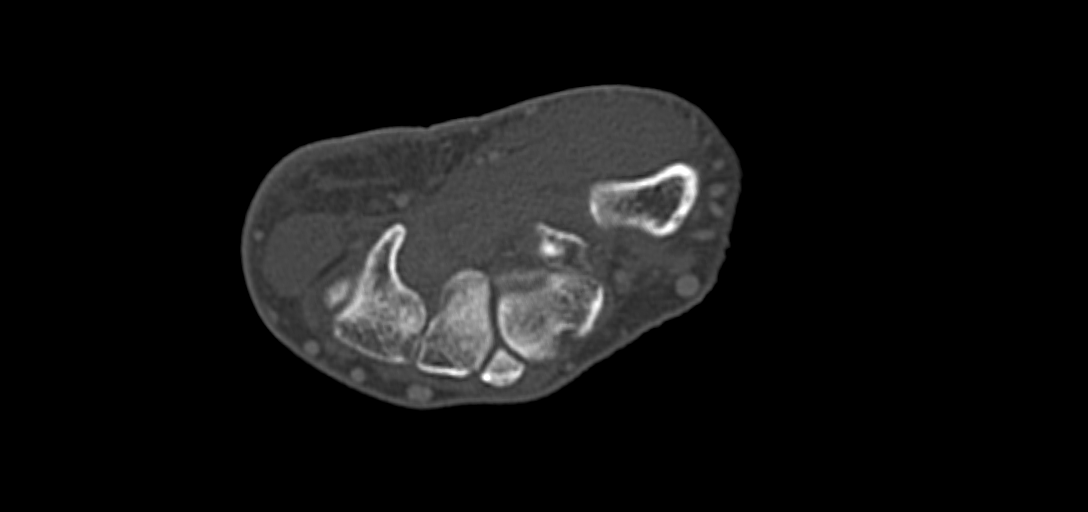
[im 109/218  bone]
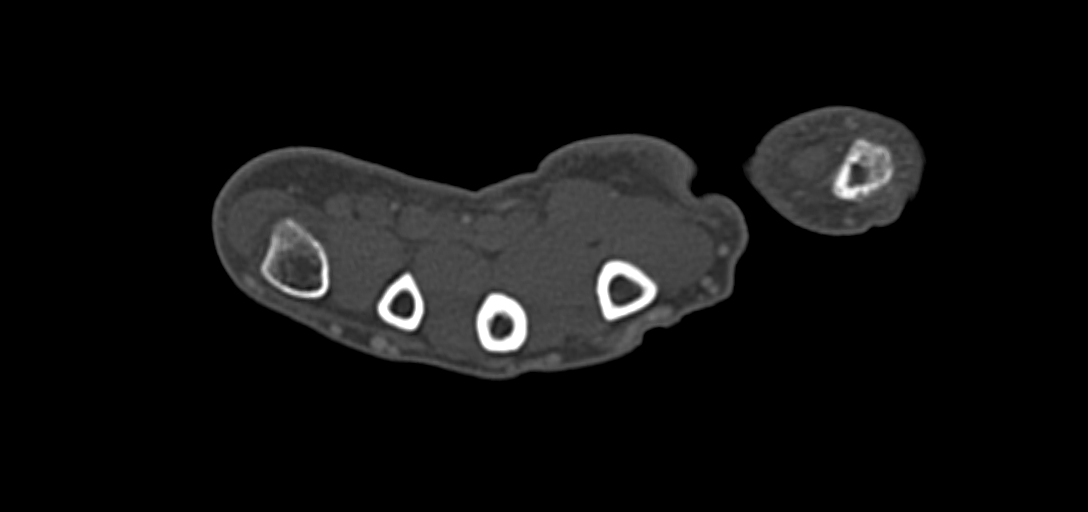
[im 145/218  bone]
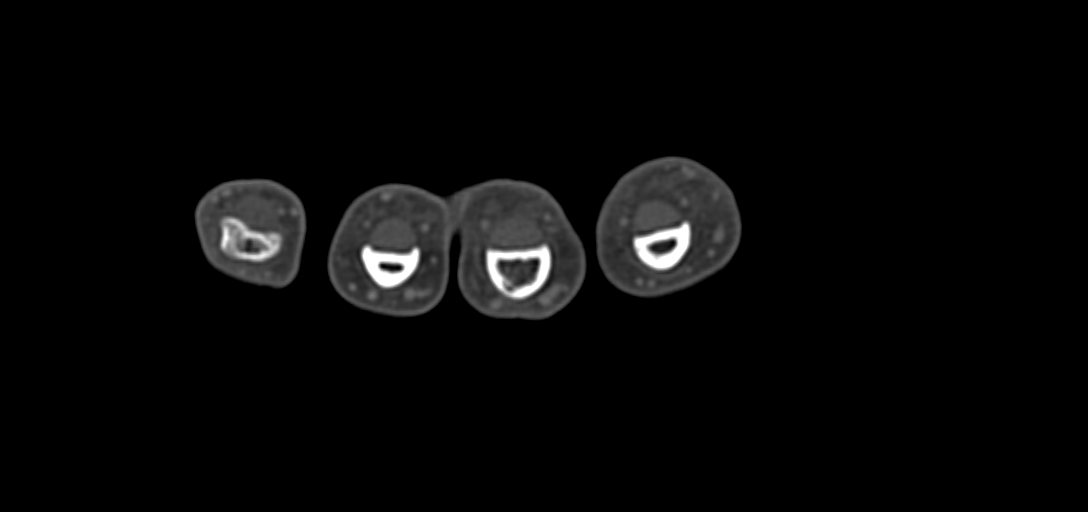
[im 181/218  soft-tissue]
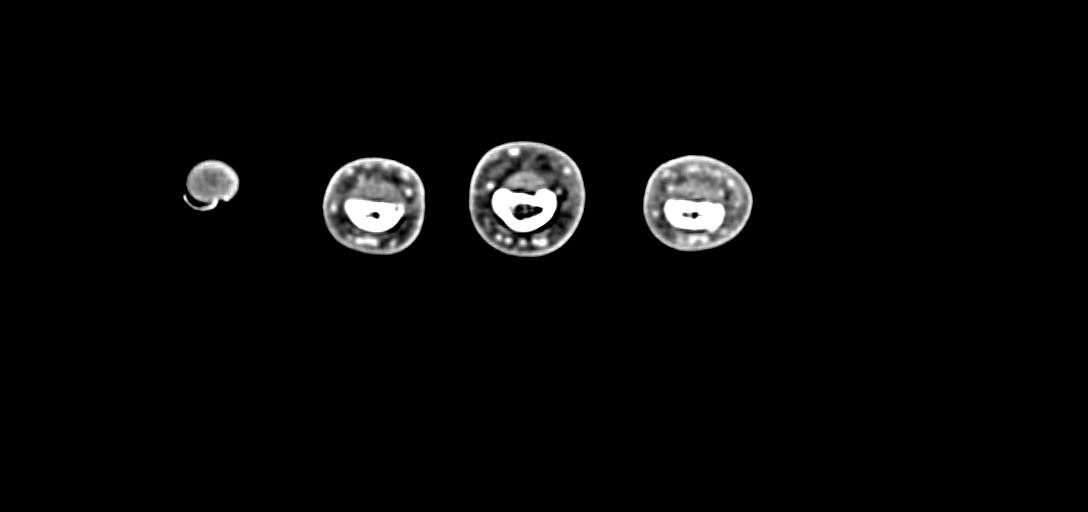
[im 181/218  bone]
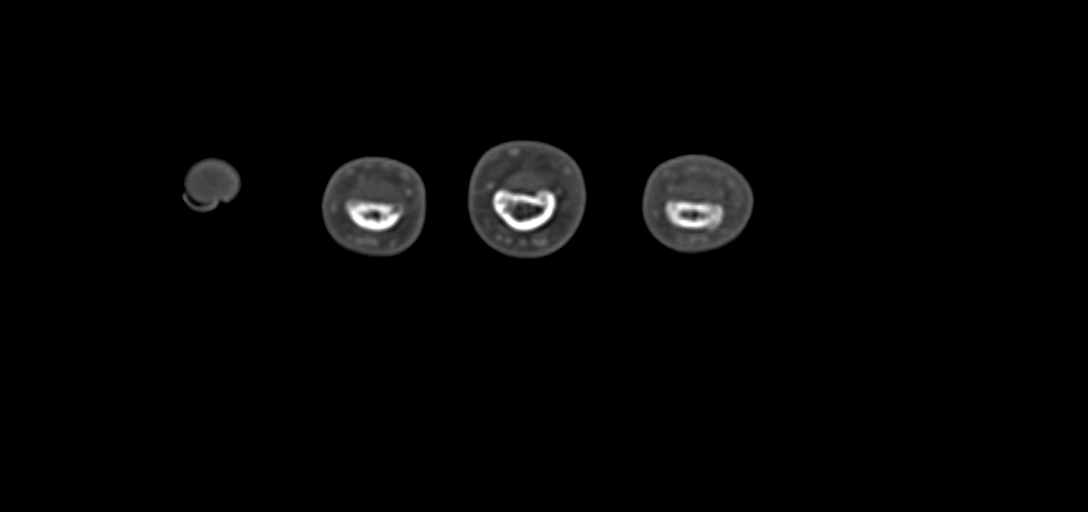

[Series 5: cor bone · coronal · 0.33mm/px · 1 of 72 slices shown]
[im 36/72  bone]
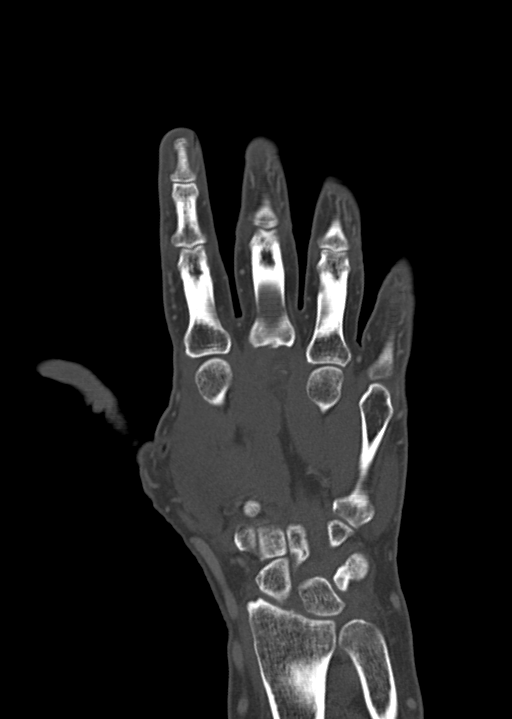

[Series 7: axial st · axial · 0.16mm/px · z∈[-131,-102]mm · 2 of 218 slices shown]
[im 37/218  bone]
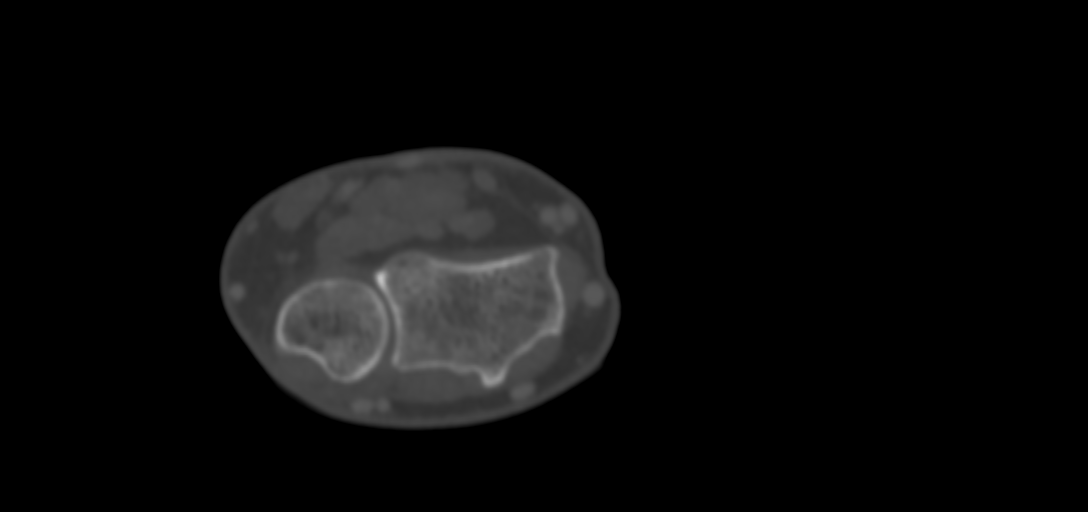
[im 73/218  bone]
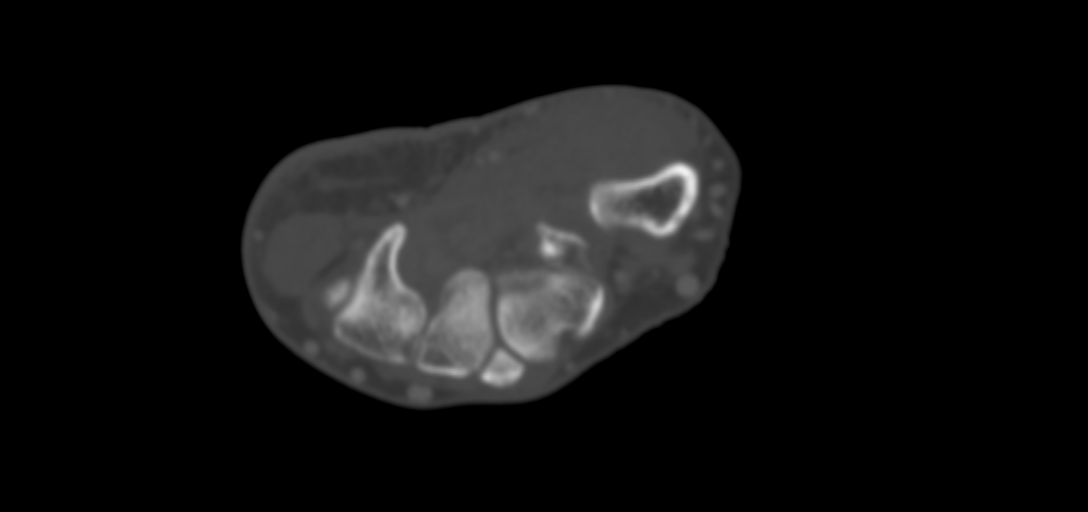

[8 of 20 positions shown; findings below may reference images not displayed]

FINDINGS: Bones/Joint/Cartilage

There are no acute or destructive bony lesions. Joint spaces are
well preserved.

Ligaments

Suboptimally assessed by CT.

Muscles and Tendons

No gross abnormalities.

Soft tissues

No fluid collections or subcutaneous gas. Mild diffuse subcutaneous
edema involving the right second digit. No radiopaque foreign
bodies.

Reconstructed images demonstrate no additional findings.
IMPRESSION: 1. Subcutaneous edema right second digit compatible with cellulitis
given clinical history. No fluid collection or abscess.
2. No fracture or radiopaque foreign body.

## 2021-10-04 MED ORDER — IOHEXOL 300 MG/ML  SOLN
100.0000 mL | Freq: Once | INTRAMUSCULAR | Status: AC | PRN
  Administered 2021-10-04: 100 mL via INTRAVENOUS
  Filled 2021-10-04: qty 100

## 2021-10-04 MED ORDER — RABIES IMMUNE GLOBULIN 300 UNIT/2ML IJ SOLN
75.0000 [IU] | Freq: Once | INTRAMUSCULAR | Status: AC
  Administered 2021-10-04: 75 [IU] via INTRAMUSCULAR
  Filled 2021-10-04: qty 2

## 2021-10-04 MED ORDER — RABIES IMMUNE GLOBULIN 150 UNIT/ML IM INJ: 20.0000 [IU]/kg | INJECTION | Freq: Once | INTRAMUSCULAR | Status: DC

## 2021-10-04 MED ORDER — MORPHINE SULFATE (PF) 4 MG/ML IV SOLN
4.0000 mg | Freq: Once | INTRAVENOUS | Status: AC
  Administered 2021-10-04: 4 mg via INTRAVENOUS
  Filled 2021-10-04: qty 1

## 2021-10-04 MED ORDER — RABIES VACCINE, PCEC IM SUSR
1.0000 mL | Freq: Once | INTRAMUSCULAR | Status: AC
  Administered 2021-10-04: 1 mL via INTRAMUSCULAR
  Filled 2021-10-04: qty 1

## 2021-10-04 MED ORDER — PIPERACILLIN-TAZOBACTAM 3.375 G IVPB 30 MIN
3.3750 g | Freq: Once | INTRAVENOUS | Status: AC
  Administered 2021-10-04: 3.375 g via INTRAVENOUS
  Filled 2021-10-04: qty 50

## 2021-10-04 MED ORDER — ONDANSETRON HCL 4 MG/2ML IJ SOLN
4.0000 mg | Freq: Once | INTRAMUSCULAR | Status: AC
  Administered 2021-10-04: 4 mg via INTRAVENOUS
  Filled 2021-10-04: qty 2

## 2021-10-04 MED ORDER — SODIUM CHLORIDE 0.9 % IV BOLUS
1000.0000 mL | Freq: Once | INTRAVENOUS | Status: AC
  Administered 2021-10-04: 1000 mL via INTRAVENOUS

## 2021-10-04 MED ORDER — RABIES IMMUNE GLOBULIN 150 UNIT/ML IM INJ
1500.0000 [IU] | INJECTION | Freq: Once | INTRAMUSCULAR | Status: AC
  Administered 2021-10-04: 1500 [IU] via INTRAMUSCULAR
  Filled 2021-10-04: qty 10

## 2021-10-04 MED ORDER — AMOXICILLIN-POT CLAVULANATE 875-125 MG PO TABS: 1.0000 | ORAL_TABLET | Freq: Two times a day (BID) | ORAL | 0 refills | Status: AC

## 2021-10-04 MED ORDER — CLINDAMYCIN HCL 300 MG PO CAPS: 300.0000 mg | ORAL_CAPSULE | Freq: Four times a day (QID) | ORAL | 0 refills | Status: AC

## 2021-10-04 NOTE — ED Provider Notes (Signed)
[facility] Emergency Department Provider Note  ____________________________________________  Time seen: Approximately 6:23 PM  I have reviewed the triage vital signs and the nursing notes.   HISTORY  Chief Complaint Animal Bite    HPI Kathleen Carr is a 50 y.o. female who presents the emergency department with a worsening finger infection.  Patient was seen originally at urgent care, was seen in this department, then by orthopedics and has had a worsening infection despite dual antibiotic coverage.  Patient is currently on Augmentin and doxycycline.  She was bitten by a feral cat, she was up-to-date with tetanus but has not received any rabies vaccinations.  No history of previous rabies vaccine.  Patient states that she is having increased pain that the swelling has not worsened.  She is having intense pain along the flexor tendon distribution from the ends of her index finger into the palm of the right hand.  She can no longer flex the finger at this time.  Patient with no fevers or chills, GI symptoms.       Past Medical History:  Diagnosis Date   Allergy    Anxiety    Arthritis    Asthma    Bipolar depression ([place])    Depression    Diabetes mellitus without complication (HCC)    GERD (gastroesophageal reflux disease)    Hypertension    PTSD (post-traumatic stress disorder)    PTSD (post-traumatic stress disorder)    Thyroid disease     Patient Active Problem List   Diagnosis Date Noted   Tick bite of right lower leg 09/09/2021   Skin lumps- left upper back  09/09/2021   Numbness of fingers of both hands 08/20/2021   Bipolar disorder, currently in remission ([place]) 07/21/2021   Controlled diabetes mellitus type 2 with complications ([place]) 92/08/9416   Gastroesophageal reflux disease 03/06/2021   Weight loss counseling, encounter for 02/04/2021   Leukocytosis 01/21/2021   Insomnia 01/21/2021   Fatigue 01/21/2021   Chronic midline low back pain  08/05/2020   Overweight (BMI 25.0-29.9) 08/05/2020   Injury of back 08/05/2020    Past Surgical History:  Procedure Laterality Date   ABDOMINAL HYSTERECTOMY     CHOLECYSTECTOMY     KNEE ARTHROSCOPY Bilateral    TONSILLECTOMY      Prior to Admission medications   Medication Sig Start Date End Date Taking? Authorizing Provider  amoxicillin-clavulanate (AUGMENTIN) 875-125 MG tablet Take 1 tablet by mouth 2 (two) times daily for 4 days. 10/04/21 10/08/21 Yes Laketa Sandoz, Charline Bills, PA-C  clindamycin (CLEOCIN) 300 MG capsule Take 1 capsule (300 mg total) by mouth 4 (four) times daily for 7 days. 10/04/21 10/11/21 Yes Giabella Duhart, Charline Bills, PA-C  albuterol (PROAIR HFA) 108 (90 Base) MCG/ACT inhaler Inhale 1-2 puffs into the lungs every 6 (six) hours as needed for wheezing. 03/18/21   Flinchum, Kelby Aline, FNP  cholecalciferol (VITAMIN D3) 25 MCG (1000 UNIT) tablet Take 1,000 Units by mouth daily. Patient not taking: Reported on 09/16/2021    [provider]  doxycycline (VIBRAMYCIN) 100 MG capsule Take 1 capsule twice a day by oral route for 10 days. 10/02/21     levothyroxine (SYNTHROID) 50 MCG tablet TAKE 1 TABLET BY MOUTH DAILY BEFORE BREAKFAST. 08/20/21 08/20/22  Crecencio Mc, MD  loratadine (CLARITIN) 10 MG tablet Take 10 mg by mouth daily.    [provider]  nystatin (MYCOSTATIN) 100000 UNIT/ML suspension Use as needed up to four times daily 62ml in mouth swish  hold and spit. Patient not taking: Reported on 09/30/2021 02/05/21   Flinchum, Kelby Aline, FNP  oxybutynin (DITROPAN XL) 10 MG 24 hr tablet Take 1 tablet (10 mg total) by mouth at bedtime. 08/20/21   Crecencio Mc, MD  oxyCODONE (ROXICODONE) 5 MG immediate release tablet Take 1 tablet (5 mg total) by mouth every 4 (four) hours as needed. 10/01/21 10/01/22  Duanne Guess, PA-C  pantoprazole (PROTONIX) 40 MG tablet Take 1 tablet (40 mg total) by mouth daily. 08/20/21 08/20/22  Crecencio Mc, MD  QUEtiapine  (SEROQUEL) 300 MG tablet TAKE 1 TABLET (300 MG TOTAL) BY MOUTH AT BEDTIME. 05/10/21 05/10/22  Dutch Quint B, FNP  rosuvastatin (CRESTOR) 5 MG tablet TAKE 1 TABLET BY MOUTH DAILY. 08/20/21 08/20/22  Crecencio Mc, MD  sertraline (ZOLOFT) 100 MG tablet TAKE 1 TABLET BY MOUTH DAILY 08/12/21 08/12/22  Dutch Quint B, FNP  traMADol (ULTRAM) 50 MG tablet Take by mouth every 6 (six) hours as needed. Pt states not effective.    [provider]  zolpidem (AMBIEN) 5 MG tablet TAKE 1 TABLET (5 MG TOTAL) BY MOUTH AT BEDTIME AS NEEDED FOR SLEEP. 07/19/21 01/15/22  Crecencio Mc, MD  sitaGLIPtin (JANUVIA) 100 MG tablet Take 1 tablet (100 mg total) by mouth daily. 08/08/20 01/22/21  Flinchum, Kelby Aline, FNP    Allergies Dust mite mixed allergen ext [mite (d. farinae)], Sulfa antibiotics, and Other  Family History  Problem Relation Age of Onset   Hypertension Mother    Thyroid disease Mother    Cancer Father 60       carcinoid, right lung   Ulcerative colitis Father    Glaucoma Maternal Grandmother    Renal Disease Maternal Grandfather    Diabetes Maternal Grandfather        Type2   Stroke Paternal Grandfather    Hypertension Paternal Grandfather     Social History Social History   Tobacco Use   Smoking status: Some Days    Types: Cigarettes    Last attempt to quit: 05/25/2020    Years since quitting: 1.3   Smokeless tobacco: Never  Vaping Use   Vaping Use: Some days  Substance Use Topics   Alcohol use: Not Currently   Drug use: Not Currently     Review of Systems  Constitutional: No fever/chills Eyes: No visual changes. No discharge ENT: No upper respiratory complaints. Cardiovascular: no chest pain. Respiratory: no cough. No SOB. Gastrointestinal: No abdominal pain.  No nausea, no vomiting.  Musculoskeletal: Animal bite to the index finger of the right hand.  Worsening infection Skin: Negative for rash, abrasions, lacerations, ecchymosis. Neurological: Negative for  headaches, focal weakness or numbness.  10 System ROS otherwise negative.  ____________________________________________   PHYSICAL EXAM:  VITAL SIGNS: ED Triage Vitals  Enc Vitals Group     BP 10/04/21 1727 110/76     Pulse Rate 10/04/21 1727 70     Resp 10/04/21 1727 17     Temp 10/04/21 1727 98 F (36.7 C)     Temp Source 10/04/21 1727 Oral     SpO2 10/04/21 1727 97 %     Weight 10/04/21 1614 173 lb 9.5 oz (78.7 kg)     Height 10/04/21 1614 5\' 4"  (1.626 m)     Head Circumference --      Peak Flow --      Pain Score 10/04/21 1614 5     Pain Loc --      Pain Edu? --  Excl. in [place]? --      Constitutional: Alert and oriented. Well appearing and in no acute distress. Eyes: Conjunctivae are normal. PERRL. EOMI. Head: Atraumatic. ENT:      Ears:       Nose: No congestion/rhinnorhea.      Mouth/Throat: Mucous membranes are moist.  Neck: No stridor.    Cardiovascular: Normal rate, regular rhythm. Normal S1 and S2.  Good peripheral circulation. Respiratory: Normal respiratory effort without tachypnea or retractions. Lungs CTAB. Good air entry to the bases with no decreased or absent breath sounds. Gastrointestinal: Bowel sounds 4 quadrants. Soft and nontender to palpation. No guarding or rigidity. No palpable masses. No distention. No CVA tenderness. Musculoskeletal: Full range of motion to all extremities. No gross deformities appreciated.  Visualization of the right hand reveals a wound consistent with a cat bite to the index finger of the right hand.  There is some mild erythema and edema of the distal phalanx.  However patient is unable to flex the finger at this time.  Pain along the flexor tendon distribution all the way into the middle of the hand.  There is no sausage digit.  Sensation and capillary refill is intact to the digit.  No other open wounds to the hand. Neurologic:  Normal speech and language. No gross focal neurologic deficits are appreciated.  Skin:  Skin  is warm, dry and intact. No rash noted. Psychiatric: Mood and affect are normal. Speech and behavior are normal. Patient exhibits appropriate insight and judgement.   ____________________________________________   LABS (all labs ordered are listed, but only abnormal results are displayed)  Labs Reviewed  LACTIC ACID, PLASMA - Abnormal; Notable for the following components:      Result Value   Lactic Acid, Venous 2.7 (*)    All other components within normal limits  COMPREHENSIVE METABOLIC PANEL - Abnormal; Notable for the following components:   Glucose, Bld 123 (*)    All other components within normal limits  CBC WITH DIFFERENTIAL/PLATELET - Abnormal; Notable for the following components:   WBC 12.8 (*)    Lymphs Abs 5.2 (*)    Abs Immature Granulocytes 0.08 (*)    All other components within normal limits  URINALYSIS, ROUTINE W REFLEX MICROSCOPIC - Abnormal; Notable for the following components:   Color, Urine YELLOW (*)    APPearance CLEAR (*)    Hgb urine dipstick MODERATE (*)    All other components within normal limits  CULTURE, BLOOD (ROUTINE X 2)  CULTURE, BLOOD (ROUTINE X 2)  LACTIC ACID, PLASMA   ____________________________________________  EKG   ____________________________________________  RADIOLOGY I personally viewed and evaluated these images as part of my medical decision making, as well as reviewing the written report by the radiologist.  ED Provider Interpretation: Slight amount of edema consistent with cellulitis.  There is no fluid collection.  No fluid or inflammation along the flexor tendon  CT HAND RIGHT W CONTRAST  Result Date: 10/04/2021 CLINICAL DATA:  Cat bite 3 days ago to index finger, worsening infection despite antibiotic therapy EXAM: CT OF THE UPPER RIGHT EXTREMITY WITH CONTRAST TECHNIQUE: Multidetector CT imaging of the upper right extremity was performed according to the standard protocol following intravenous contrast administration.  CONTRAST:  135mL OMNIPAQUE IOHEXOL 300 MG/ML  SOLN COMPARISON:  10/01/2021 FINDINGS: Bones/Joint/Cartilage There are no acute or destructive bony lesions. Joint spaces are well preserved. Ligaments Suboptimally assessed by CT. Muscles and Tendons No gross abnormalities. Soft tissues No fluid collections or subcutaneous gas.  Mild diffuse subcutaneous edema involving the right second digit. No radiopaque foreign bodies. Reconstructed images demonstrate no additional findings. IMPRESSION: 1. Subcutaneous edema right second digit compatible with cellulitis given clinical history. No fluid collection or abscess. 2. No fracture or radiopaque foreign body. Electronically Signed   By: Randa Ngo M.D.   On: 10/04/2021 19:16    ____________________________________________    PROCEDURES  Procedure(s) performed:    Procedures    Medications  piperacillin-tazobactam (ZOSYN) IVPB 3.375 g (0 g Intravenous Stopped 10/04/21 2006)  rabies vaccine (RABAVERT) injection 1 mL (1 mL Intramuscular Given 10/04/21 1928)  sodium chloride 0.9 % bolus 1,000 mL (0 mLs Intravenous Stopped 10/04/21 2105)  iohexol (OMNIPAQUE) 300 MG/ML solution 100 mL (100 mLs Intravenous Contrast Given 10/04/21 1836)  rabies immune globulin (HYPERAB/KEDRAB) injection 1,500 Units (1,500 Units Intramuscular Given 10/04/21 1944)    And  rabies immune globulin (HYPERAB) injection 75 Units (75 Units Intramuscular Given 10/04/21 1949)  ondansetron (ZOFRAN) injection 4 mg (4 mg Intravenous Given 10/04/21 1942)  morphine 4 MG/ML injection 4 mg (4 mg Intravenous Given 10/04/21 1942)     ____________________________________________   INITIAL IMPRESSION / ASSESSMENT AND PLAN / ED COURSE  Pertinent labs & imaging results that were available during my care of the patient were reviewed by me and considered in my medical decision making (see chart for details).  Review of the [place] CSRS was performed in accordance of the [place] prior to dispensing any  controlled drugs.           Patient's diagnosis is consistent with cat bite to the finger.  Patient presents to the emergency department after being bit by a cat.  This is her second visit and was sent to the emergency department from orthopedics for concern that antibiotics were not resolving infection.  There is no sausage digit though patient does have difficulty flexing her finger.  There is a small amount of edema on CT scan consistent with cellulitis..  Patient's initial lactic was 2.7 and dropped to 1.7.  Patient vital signs have been reassuring.  Patient is nontoxic and appearing.  At this time with reassuring work-up with CT, labs I feel that patient is a candidate for stronger oral antibiotics at home.  Patient is agreeable with this plan.  Patient had not been given rabies series.  Patient was bit by a feral animal at this time I felt that patient did not require rabies vaccination.  This was provided today.  She will follow-up in 3 days for second rabies shot.  Return precautions discussed at length with the patient.  She will have clindamycin added to her Augmentin which is extended to a 10-day course of Augmentin. Patient is given ED precautions to return to the ED for any worsening or new symptoms.     ____________________________________________  FINAL CLINICAL IMPRESSION(S) / ED DIAGNOSES  Final diagnoses:  Cat bite, subsequent encounter      NEW MEDICATIONS STARTED DURING THIS VISIT:  ED Discharge Orders          Ordered    clindamycin (CLEOCIN) 300 MG capsule  4 times daily        10/04/21 2144    amoxicillin-clavulanate (AUGMENTIN) 875-125 MG tablet  2 times daily        10/04/21 2144                This chart was dictated using voice recognition software/Dragon. Despite best efforts to proofread, errors can occur which can change the  meaning. Any change was purely unintentional.    Darletta Moll, PA-C 10/04/21 2242    Nance Pear,  MD 10/04/21 2312

## 2021-10-04 NOTE — ED Notes (Signed)
Dc ppw provided. Pt followup reviewed and given. Pt denies qquestions. Pt off unit

## 2021-10-04 NOTE — ED Triage Notes (Signed)
Pt comes into the ED via POV c/o cat bite to the index finger.  Pt states she got bit on Tuesday and was seen, she followed up with [facility] today and they sent her back here for failed oral abx therapy.  Pt in NAD at this time.

## 2021-10-06 LAB — AEROBIC/ANAEROBIC CULTURE W GRAM STAIN (SURGICAL/DEEP WOUND)

## 2021-10-08 ENCOUNTER — Ambulatory Visit
Admission: RE | Admit: 2021-10-08 | Discharge: 2021-10-08 | Disposition: A | Discharge status: Home / Self Care | Payer: Self-pay | Source: Ambulatory Visit | Attending: Emergency Medicine | Admitting: Emergency Medicine

## 2021-10-08 ENCOUNTER — Other Ambulatory Visit: Payer: Self-pay

## 2021-10-08 DIAGNOSIS — Z203 Contact with and (suspected) exposure to rabies: Secondary | ICD-10-CM

## 2021-10-08 MED ORDER — RABIES VACCINE, PCEC IM SUSR
1.0000 mL | Freq: Once | INTRAMUSCULAR | Status: AC
  Administered 2021-10-08: 1 mL via INTRAMUSCULAR

## 2021-10-08 NOTE — ED Triage Notes (Signed)
2nd rabies shot

## 2021-10-09 LAB — CULTURE, BLOOD (ROUTINE X 2)
Culture: NO GROWTH
Culture: NO GROWTH

## 2021-10-11 ENCOUNTER — Ambulatory Visit
Admission: RE | Admit: 2021-10-11 | Discharge: 2021-10-11 | Disposition: A | Discharge status: Home / Self Care | Payer: Self-pay | Source: Ambulatory Visit | Attending: Adult Health | Admitting: Adult Health

## 2021-10-11 ENCOUNTER — Other Ambulatory Visit: Payer: Self-pay

## 2021-10-11 DIAGNOSIS — Z203 Contact with and (suspected) exposure to rabies: Secondary | ICD-10-CM

## 2021-10-11 NOTE — ED Triage Notes (Signed)
Pt presents for 3rd Rabies Vaccine

## 2021-10-15 ENCOUNTER — Other Ambulatory Visit: Payer: Self-pay

## 2021-10-17 ENCOUNTER — Other Ambulatory Visit: Payer: Self-pay

## 2021-10-17 MED FILL — Zolpidem Tartrate Tab 5 MG: ORAL | 30 days supply | Qty: 30 | Fill #3 | Status: AC

## 2021-10-18 ENCOUNTER — Other Ambulatory Visit: Payer: Self-pay

## 2021-10-18 ENCOUNTER — Ambulatory Visit: Payer: Medicaid Other

## 2021-10-19 ENCOUNTER — Other Ambulatory Visit: Payer: Self-pay

## 2021-10-19 ENCOUNTER — Ambulatory Visit
Admission: RE | Admit: 2021-10-19 | Discharge: 2021-10-19 | Disposition: A | Discharge status: Home / Self Care | Payer: Medicaid Other | Source: Ambulatory Visit | Attending: Physician Assistant | Admitting: Physician Assistant

## 2021-10-19 DIAGNOSIS — Z203 Contact with and (suspected) exposure to rabies: Secondary | ICD-10-CM

## 2021-10-19 MED ORDER — RABIES VACCINE, PCEC IM SUSR
1.0000 mL | Freq: Once | INTRAMUSCULAR | Status: AC
  Administered 2021-10-19: 11:00:00 1 mL via INTRAMUSCULAR

## 2021-10-19 NOTE — ED Triage Notes (Signed)
Pt presents for 3rd rabies vaccine. Previous visit was her 2nd vaccine.

## 2021-10-31 ENCOUNTER — Other Ambulatory Visit: Payer: Self-pay

## 2021-10-31 ENCOUNTER — Ambulatory Visit (INDEPENDENT_AMBULATORY_CARE_PROVIDER_SITE_OTHER): Payer: 59 | Admitting: Neurology

## 2021-10-31 DIAGNOSIS — R202 Paresthesia of skin: Secondary | ICD-10-CM | POA: Diagnosis not present

## 2021-10-31 DIAGNOSIS — G5603 Carpal tunnel syndrome, bilateral upper limbs: Secondary | ICD-10-CM

## 2021-10-31 NOTE — Procedures (Signed)
[facility]  [address], [address]  [place], [place] [address] Tel: [phone] Fax:  [phone] Test Date:  10/31/2021  Patient: Kathleen Carr DOB: 02-09-1971 Physician: Narda Amber, DO  Sex: Female Height: 5\' 4"  Ref Phys: Deborra Medina, M.D.  ID#: [id number]   Technician:    Patient Complaints: This is a 51 year old female referred for evaluation of bilateral arm paresthesias.  NCV & EMG Findings: Extensive electrodiagnostic testing of the right upper extremity and additional studies of the left shows:  Bilateral median sensory responses show prolonged latency (R4.9, L4.9 ms) and reduced amplitude (R5.6, L9.4 V).  Bilateral ulnar sensory responses are within normal limits. Bilateral median motor responses show prolonged latency (R4.3, L4.6 ms).  Bilateral ulnar motor responses are within normal limits.  There is no evidence of active or chronic motor axonal loss changes affecting any of the tested muscles.  Motor unit configuration and recruitment pattern is within normal limits.    Impression: Bilateral median neuropathy at or distal to the wrist, consistent with a clinical diagnosis of carpal tunnel syndrome.  Overall, these findings are moderate-to-severe in degree electrically.   ___________________________ Narda Amber, DO    Nerve Conduction Studies Anti Sensory Summary Table   Stim Site NR Peak (ms) Norm Peak (ms) P-T Amp (V) Norm P-T Amp  Left Median Anti Sensory (2nd Digit)  34C  Wrist    4.9 <3.6 9.4 >15  Right Median Anti Sensory (2nd Digit)  34C  Wrist    4.9 <3.6 5.6 >15  Left Ulnar Anti Sensory (5th Digit)  34C  Wrist    2.4 <3.1 24.8 >10  Right Ulnar Anti Sensory (5th Digit)  34C  Wrist    2.5 <3.1 21.3 >10   Motor Summary Table   Stim Site NR Onset (ms) Norm Onset (ms) O-P Amp (mV) Norm O-P Amp Site1 Site2 Delta-0 (ms) Dist (cm) Vel (m/s) Norm Vel (m/s)  Left Median Motor (Abd Poll Brev)  34C  Wrist    4.6 <4.0 8.7 >6 Elbow Wrist  5.1 28.0 55 >50  Elbow    9.7  8.1         Right Median Motor (Abd Poll Brev)  34C  Wrist    4.3 <4.0 8.9 >6 Elbow Wrist 4.5 27.0 60 >50  Elbow    8.8  8.1         Left Ulnar Motor (Abd Dig Minimi)  34C  Wrist    2.3 <3.1 8.6 >7 B Elbow Wrist 3.2 21.0 66 >50  B Elbow    5.5  8.6  A Elbow B Elbow 1.7 10.0 59 >50  A Elbow    7.2  8.2         Right Ulnar Motor (Abd Dig Minimi)  34C  Wrist    2.1 <3.1 8.8 >7 B Elbow Wrist 3.4 21.0 62 >50  B Elbow    5.5  8.5  A Elbow B Elbow 1.8 10.0 56 >50  A Elbow    7.3  8.1          EMG   Side Muscle Ins Act Fibs Psw Fasc Number Recrt Dur Dur. Amp Amp. Poly Poly. Comment  Right 1stDorInt Nml Nml Nml Nml Nml Nml Nml Nml Nml Nml Nml Nml N/A  Right Abd Poll Brev Nml Nml Nml Nml Nml Nml Nml Nml Nml Nml Nml Nml N/A  Right PronatorTeres Nml Nml Nml Nml Nml Nml Nml Nml Nml Nml Nml Nml N/A  Right  Biceps Nml Nml Nml Nml Nml Nml Nml Nml Nml Nml Nml Nml N/A  Right Triceps Nml Nml Nml Nml Nml Nml Nml Nml Nml Nml Nml Nml N/A  Right Deltoid Nml Nml Nml Nml Nml Nml Nml Nml Nml Nml Nml Nml N/A  Left 1stDorInt Nml Nml Nml Nml Nml Nml Nml Nml Nml Nml Nml Nml N/A  Left Abd Poll Brev Nml Nml Nml Nml Nml Nml Nml Nml Nml Nml Nml Nml N/A  Left PronatorTeres Nml Nml Nml Nml Nml Nml Nml Nml Nml Nml Nml Nml N/A  Left Biceps Nml Nml Nml Nml Nml Nml Nml Nml Nml Nml Nml Nml N/A  Left Triceps Nml Nml Nml Nml Nml Nml Nml Nml Nml Nml Nml Nml N/A  Left Deltoid Nml Nml Nml Nml Nml Nml Nml Nml Nml Nml Nml Nml N/A      Waveforms:

## 2021-11-02 ENCOUNTER — Encounter: Payer: Self-pay | Admitting: Internal Medicine

## 2021-11-02 DIAGNOSIS — G5601 Carpal tunnel syndrome, right upper limb: Secondary | ICD-10-CM | POA: Insufficient documentation

## 2021-11-02 DIAGNOSIS — S61250A Open bite of right index finger without damage to nail, initial encounter: Secondary | ICD-10-CM | POA: Insufficient documentation

## 2021-11-02 DIAGNOSIS — W5501XA Bitten by cat, initial encounter: Secondary | ICD-10-CM | POA: Insufficient documentation

## 2021-11-02 DIAGNOSIS — G5603 Carpal tunnel syndrome, bilateral upper limbs: Secondary | ICD-10-CM | POA: Insufficient documentation

## 2021-11-05 ENCOUNTER — Telehealth: Payer: Self-pay | Admitting: Adult Health

## 2021-11-05 NOTE — Telephone Encounter (Signed)
ejection Reason - Other - This patient has a health policy we do not accept. Please refer to a provider that accepts their plan. Thank you." Kathleen Carr said on Nov 05, 2021 2:37 PM  I spoke with pt about [facility] not accepting ins. Pt will check with her ins to see who she can see pt will call me back.  Msg from [facility]

## 2021-11-05 NOTE — Telephone Encounter (Signed)
Pt called me back with whom she can go to. Pt referral was sent to [facility] per pt ins. Thank you!

## 2021-11-10 ENCOUNTER — Other Ambulatory Visit: Payer: Self-pay | Admitting: Family

## 2021-11-11 ENCOUNTER — Other Ambulatory Visit: Payer: Self-pay

## 2021-11-11 ENCOUNTER — Other Ambulatory Visit: Payer: Self-pay | Admitting: Adult Health

## 2021-11-11 DIAGNOSIS — G5603 Carpal tunnel syndrome, bilateral upper limbs: Secondary | ICD-10-CM

## 2021-11-11 NOTE — Telephone Encounter (Signed)
Needing new referral to [facility] orthopedic. The current one has [facility] info on it. Please put [facility] on new referral. Please advise and Thank you!

## 2021-11-12 NOTE — Telephone Encounter (Signed)
Noted and was placed.

## 2021-11-14 ENCOUNTER — Other Ambulatory Visit: Payer: Self-pay | Admitting: Family

## 2021-11-14 ENCOUNTER — Other Ambulatory Visit: Payer: Self-pay

## 2021-11-14 DIAGNOSIS — F319 Bipolar disorder, unspecified: Secondary | ICD-10-CM

## 2021-11-14 MED ORDER — SERTRALINE HCL 100 MG PO TABS
ORAL_TABLET | Freq: Every day | ORAL | 0 refills | Status: DC
  Filled 2021-11-14: qty 90, 90d supply, fill #0

## 2021-11-14 MED ORDER — QUETIAPINE FUMARATE 300 MG PO TABS
ORAL_TABLET | ORAL | 1 refills | Status: DC
  Filled 2021-11-14: qty 30, 30d supply, fill #0
  Filled 2021-12-17: qty 30, 30d supply, fill #1
  Filled 2022-01-14: qty 30, 30d supply, fill #2
  Filled 2022-02-13: qty 90, 90d supply, fill #3

## 2021-11-17 ENCOUNTER — Other Ambulatory Visit: Payer: Self-pay | Admitting: Internal Medicine

## 2021-11-17 DIAGNOSIS — G4700 Insomnia, unspecified: Secondary | ICD-10-CM

## 2021-11-18 ENCOUNTER — Other Ambulatory Visit: Payer: Self-pay

## 2021-11-18 MED ORDER — ZOLPIDEM TARTRATE 5 MG PO TABS
ORAL_TABLET | ORAL | 3 refills | Status: DC
  Filled 2021-11-18: qty 30, 30d supply, fill #0

## 2021-11-19 ENCOUNTER — Ambulatory Visit (INDEPENDENT_AMBULATORY_CARE_PROVIDER_SITE_OTHER): Payer: 59 | Admitting: Adult Health

## 2021-11-19 ENCOUNTER — Other Ambulatory Visit: Payer: Self-pay

## 2021-11-19 ENCOUNTER — Ambulatory Visit (INDEPENDENT_AMBULATORY_CARE_PROVIDER_SITE_OTHER): Payer: 59

## 2021-11-19 ENCOUNTER — Encounter: Payer: Self-pay | Admitting: Adult Health

## 2021-11-19 VITALS — BP 118/70 | HR 72 | Temp 98.0°F | Resp 16 | Ht 64.0 in | Wt 180.4 lb

## 2021-11-19 DIAGNOSIS — E039 Hypothyroidism, unspecified: Secondary | ICD-10-CM

## 2021-11-19 DIAGNOSIS — E559 Vitamin D deficiency, unspecified: Secondary | ICD-10-CM

## 2021-11-19 DIAGNOSIS — R110 Nausea: Secondary | ICD-10-CM | POA: Diagnosis not present

## 2021-11-19 DIAGNOSIS — M79644 Pain in right finger(s): Secondary | ICD-10-CM

## 2021-11-19 DIAGNOSIS — Z9889 Other specified postprocedural states: Secondary | ICD-10-CM

## 2021-11-19 DIAGNOSIS — M5440 Lumbago with sciatica, unspecified side: Secondary | ICD-10-CM

## 2021-11-19 DIAGNOSIS — K219 Gastro-esophageal reflux disease without esophagitis: Secondary | ICD-10-CM

## 2021-11-19 DIAGNOSIS — G4700 Insomnia, unspecified: Secondary | ICD-10-CM

## 2021-11-19 DIAGNOSIS — F3170 Bipolar disorder, currently in remission, most recent episode unspecified: Secondary | ICD-10-CM

## 2021-11-19 DIAGNOSIS — F418 Other specified anxiety disorders: Secondary | ICD-10-CM

## 2021-11-19 DIAGNOSIS — E118 Type 2 diabetes mellitus with unspecified complications: Secondary | ICD-10-CM

## 2021-11-19 IMAGING — DX DG LUMBAR SPINE COMPLETE 4+V
5 series · 5 of 5 positions shown · non-contrast
Comparison: None.

CLINICAL DATA: Low back pain

EXAM:
LUMBAR SPINE - COMPLETE 4+ VIEW

[lumbar spine ap]
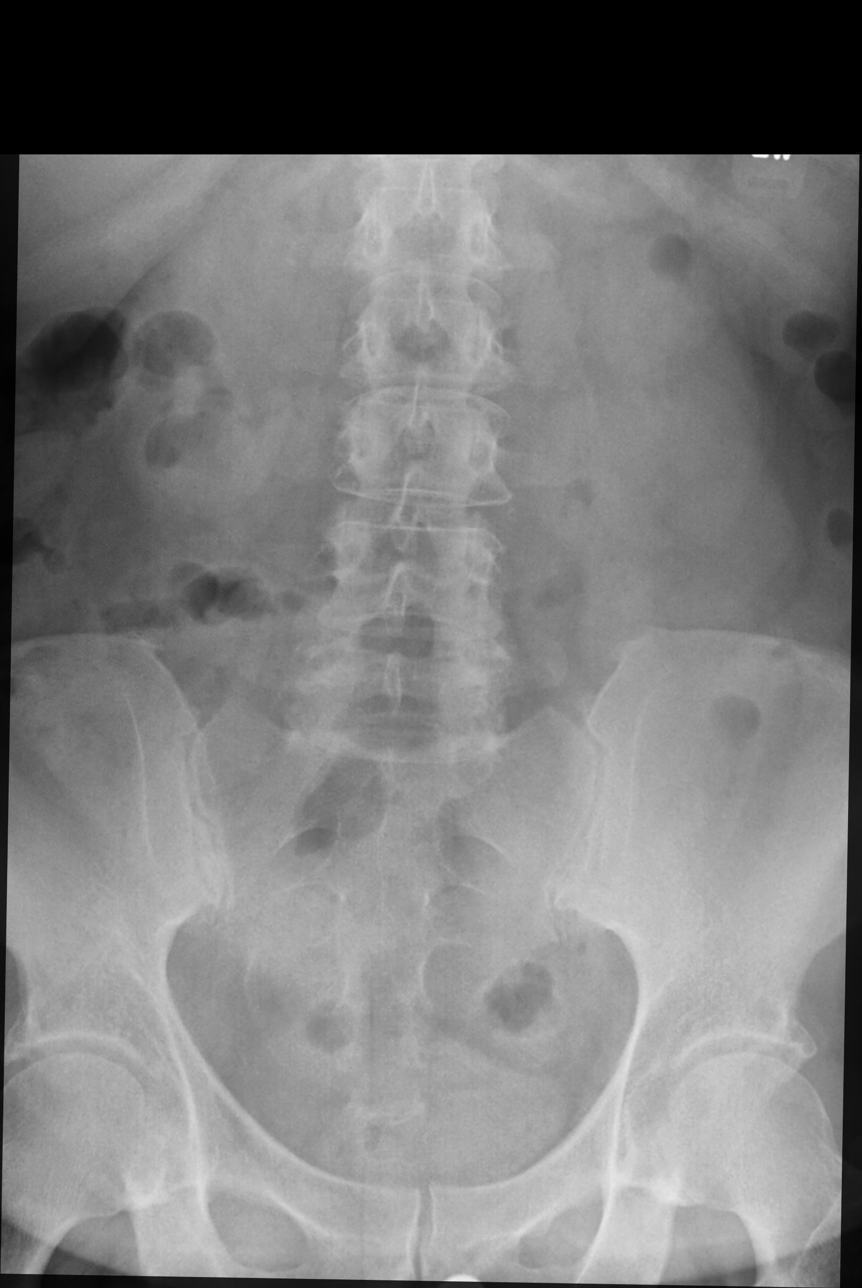

[lumbar spine obl (oblique) (1 of 2)]
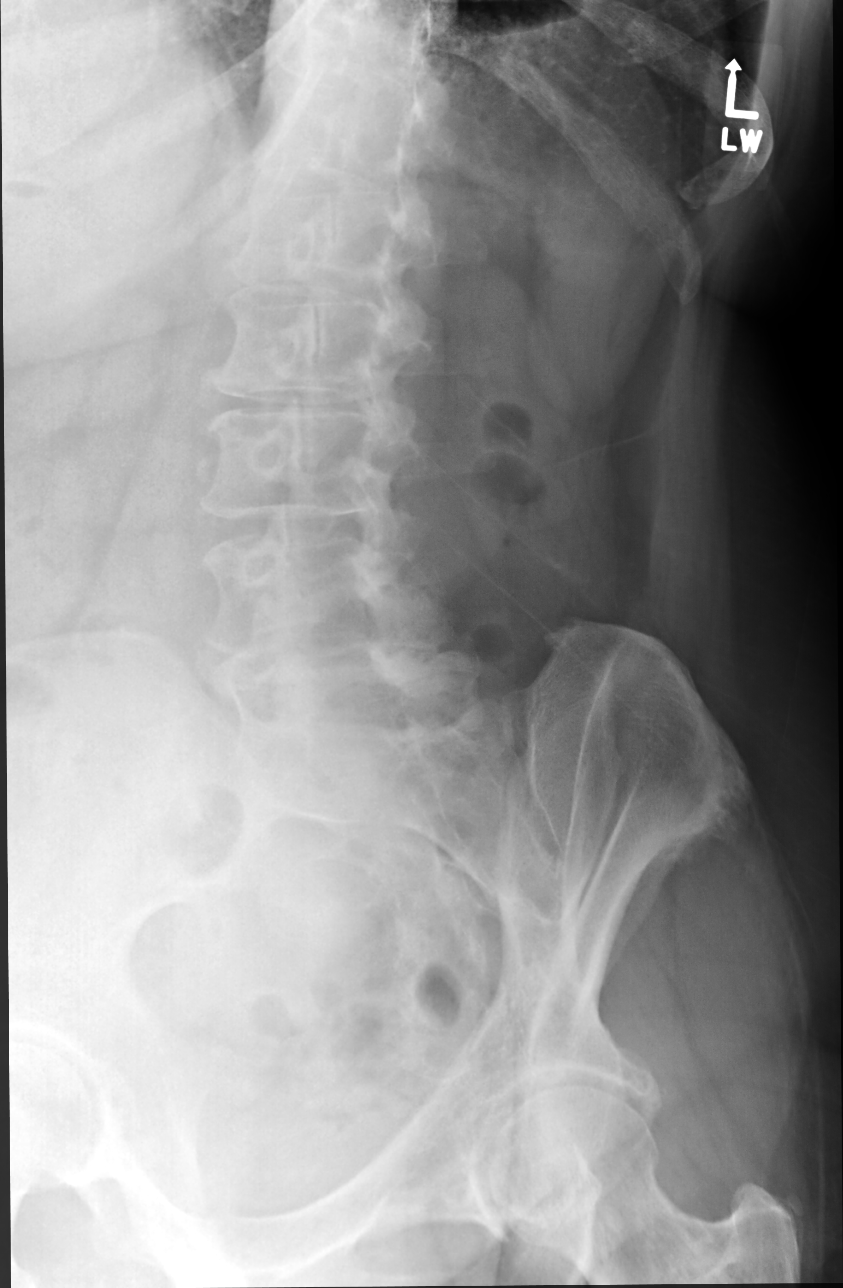

[lumbar spine obl (oblique) (2 of 2)]
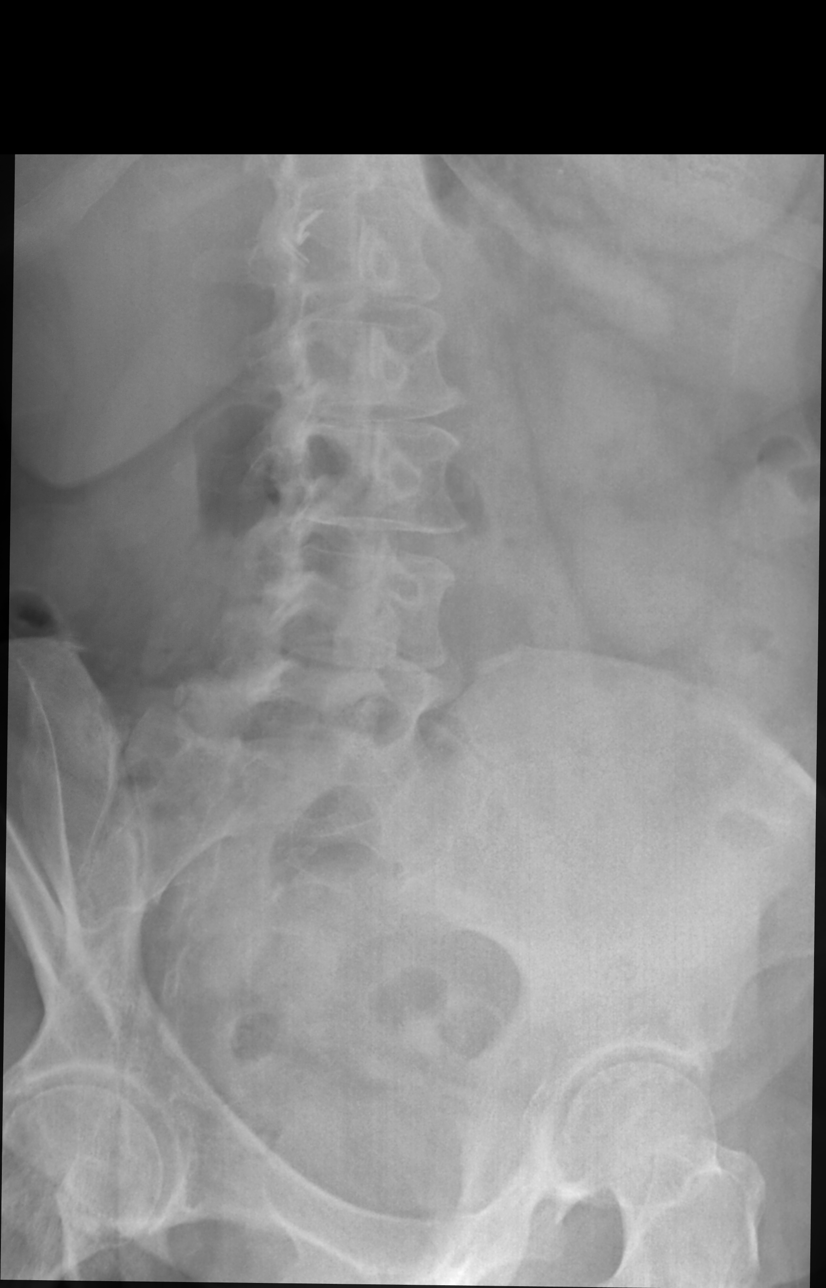

[lumbar spine lat]
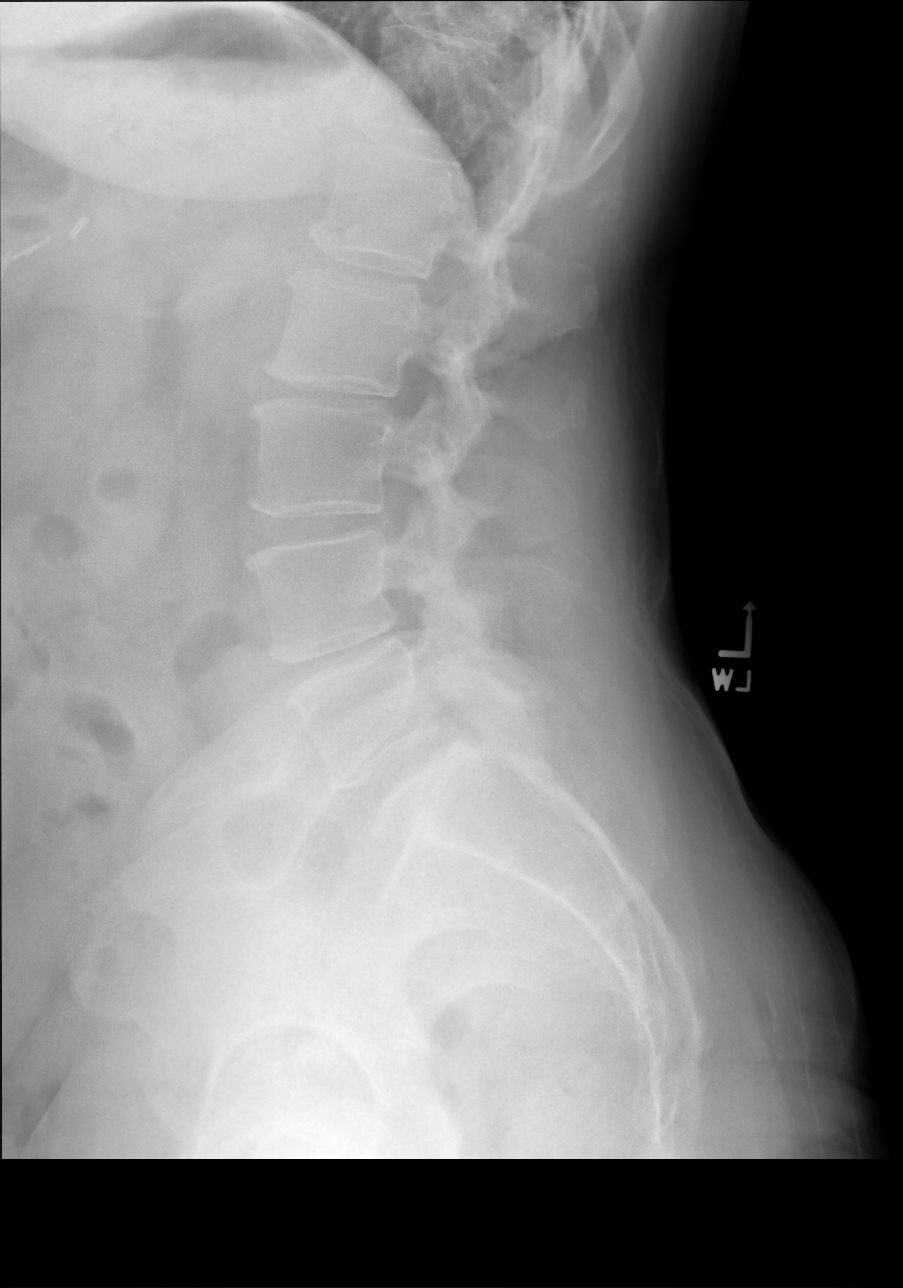

[lumbar spot lat]
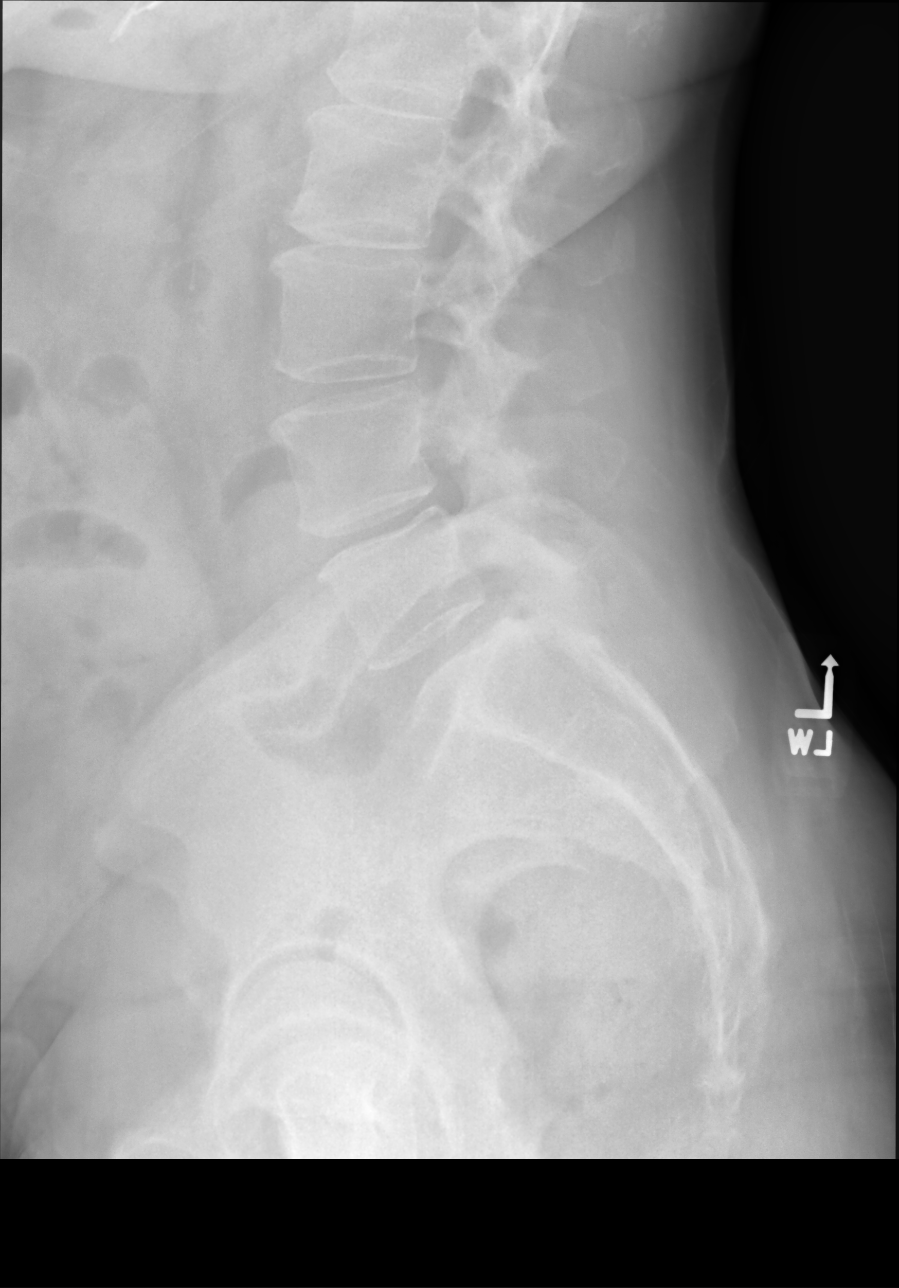

[5 of 5 positions shown; findings below may reference images not displayed]

FINDINGS: Lumbar alignment within normal limits. Vertebral body heights are
maintained. Mild disc space narrowing at L2-L3 and L4-L5. Facet
degenerative changes of the lower lumbar spine.
IMPRESSION: Mild multilevel degenerative change

## 2021-11-19 IMAGING — DX DG HAND COMPLETE 3+V*R*
3 series · 3 of 3 positions shown · non-contrast
Comparison: Right index finger radiographs [DATE] and CT right
hand

CLINICAL DATA: Right hand thumb pain.  Evaluate for foreign body.

EXAM:
RIGHT HAND - COMPLETE 3+ VIEW

[hand pa]
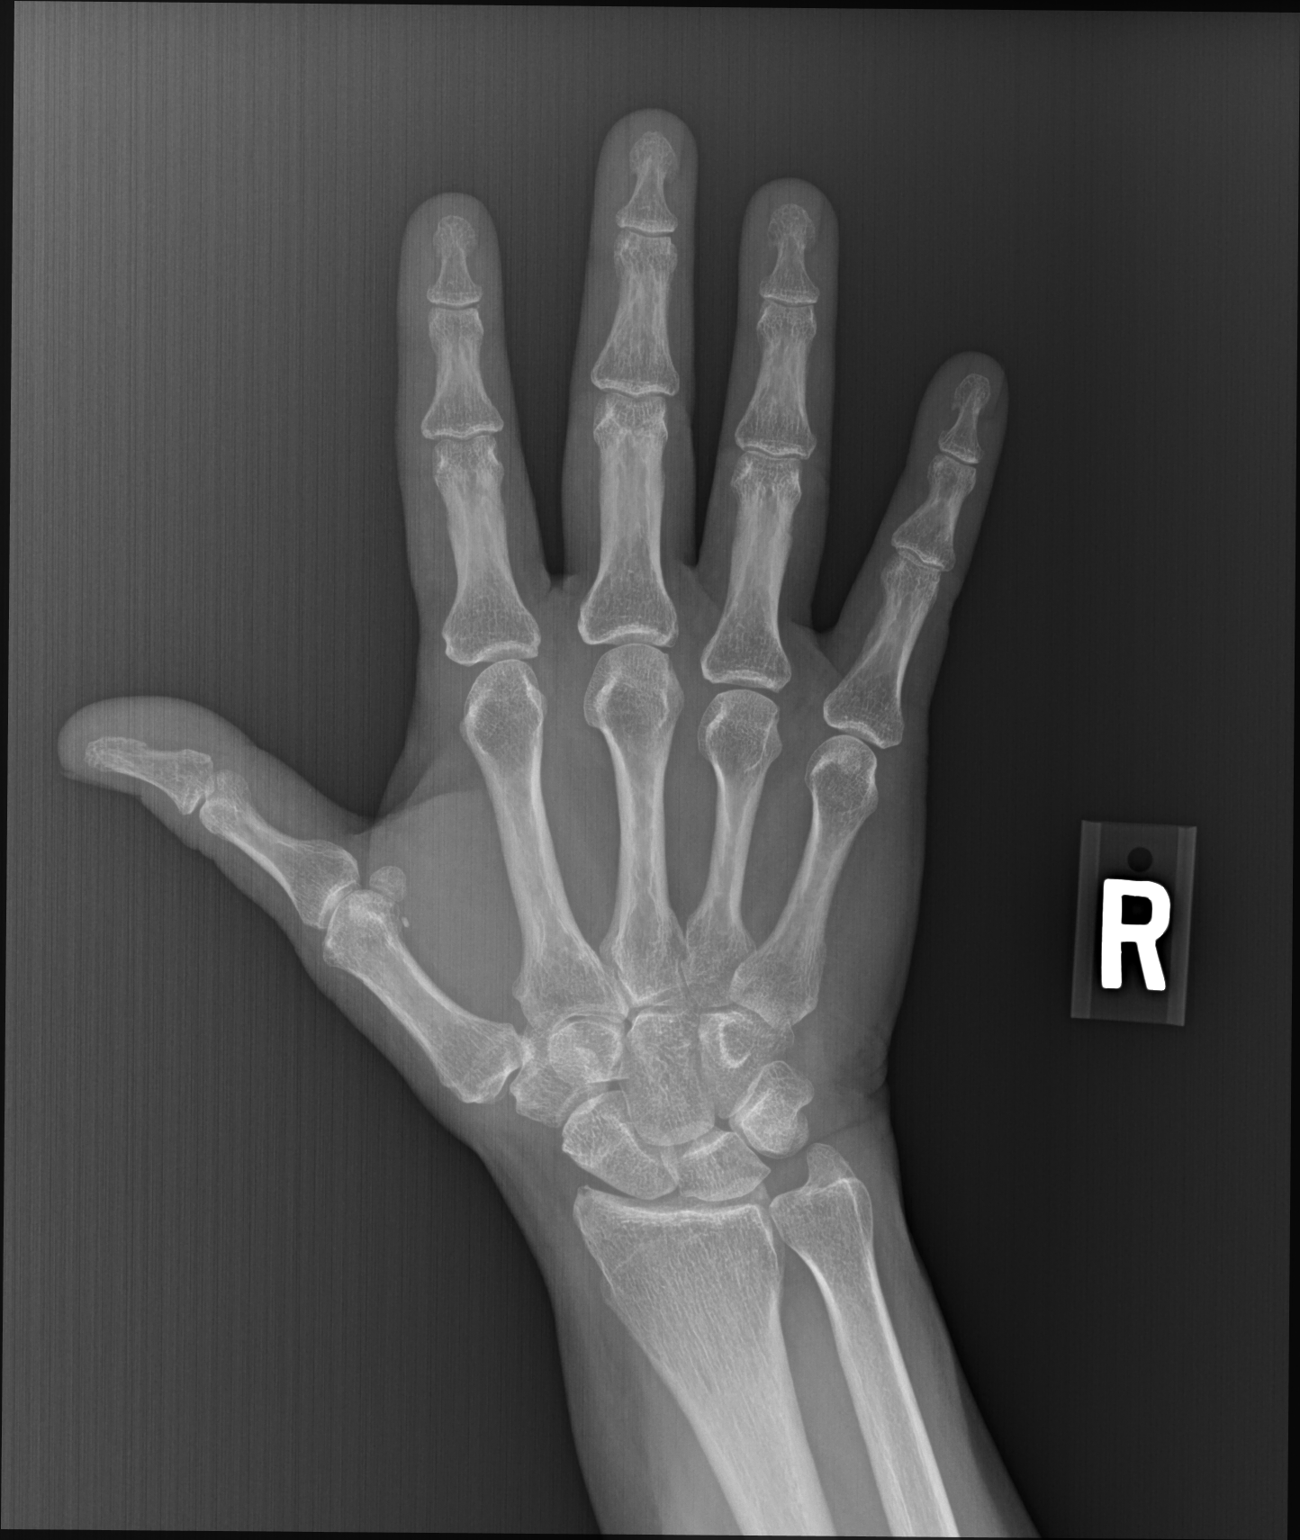

[hand obl (oblique)]
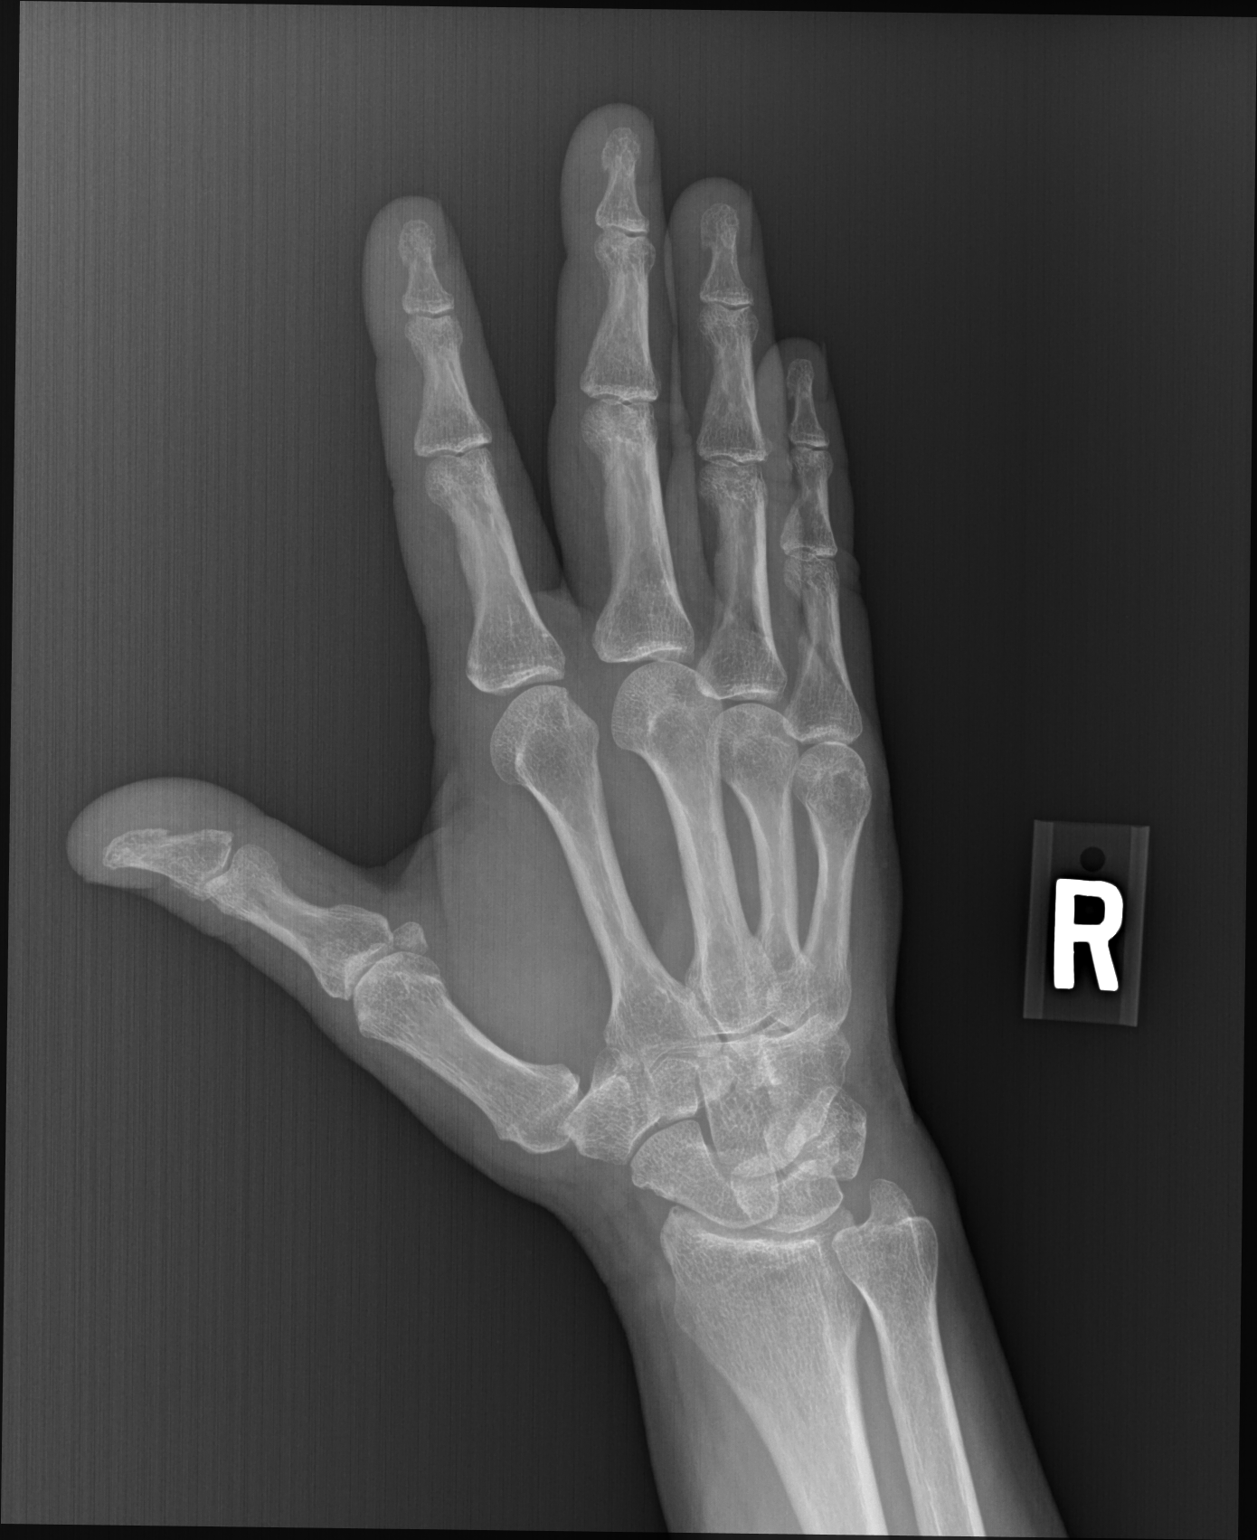

[hand lat]
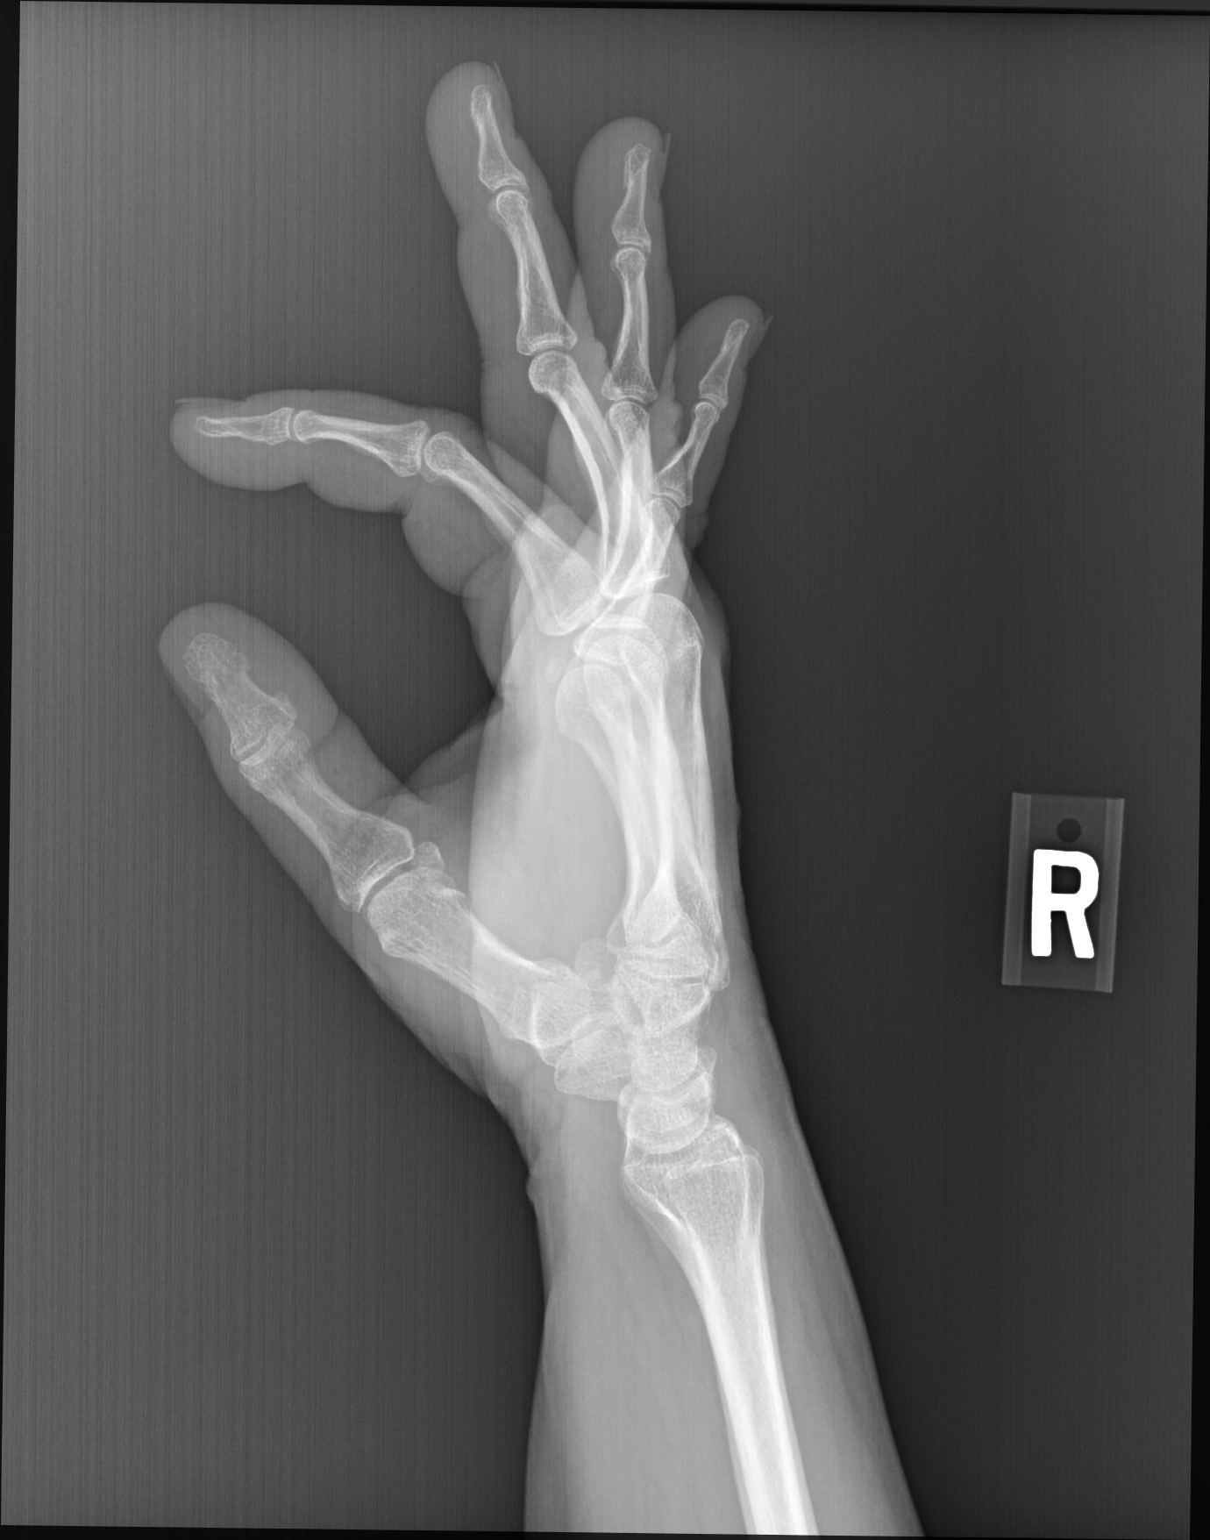

[3 of 3 positions shown; findings below may reference images not displayed]

FINDINGS: There is a 2 mm density seen just volar to the thumb metacarpal
distal metaphysis on frontal view. This is not well visualized on
the other views due to overlapping bone. Mild thumb
metacarpophalangeal joint space narrowing degenerative change.

No acute fracture or dislocation.
IMPRESSION: There is a 2 mm density just volar to the distal metaphysis of the
thumb metacarpal this may represent incidental calcification. Given
the indication, cannot exclude a foreign body.

There is also mild thumb metacarpophalangeal osteoarthritis.

## 2021-11-19 MED ORDER — ALPRAZOLAM 0.25 MG PO TABS
0.2500 mg | ORAL_TABLET | Freq: Two times a day (BID) | ORAL | 0 refills | Status: DC | PRN
  Filled 2021-11-19: qty 30, 8d supply, fill #0

## 2021-11-19 MED ORDER — CYCLOBENZAPRINE HCL 10 MG PO TABS
10.0000 mg | ORAL_TABLET | Freq: Three times a day (TID) | ORAL | 0 refills | Status: DC | PRN
  Filled 2021-11-19: qty 30, 10d supply, fill #0

## 2021-11-19 MED ORDER — ZOLPIDEM TARTRATE 5 MG PO TABS
ORAL_TABLET | ORAL | 3 refills | Status: DC
  Filled 2021-11-19: qty 30, 30d supply, fill #0
  Filled 2021-12-17 (×2): qty 30, 30d supply, fill #1
  Filled 2022-01-14: qty 30, 30d supply, fill #2
  Filled 2022-02-13: qty 30, 30d supply, fill #3
  Filled ????-??-??: fill #1

## 2021-11-19 MED ORDER — TRAMADOL HCL 50 MG PO TABS
50.0000 mg | ORAL_TABLET | Freq: Every day | ORAL | 0 refills | Status: DC | PRN
  Filled 2021-11-19: qty 60, 30d supply, fill #0

## 2021-11-19 MED ORDER — METFORMIN HCL 500 MG PO TABS
500.0000 mg | ORAL_TABLET | Freq: Two times a day (BID) | ORAL | 3 refills | Status: DC
  Filled 2021-11-19: qty 180, 90d supply, fill #0
  Filled 2022-02-13: qty 180, 90d supply, fill #1

## 2021-11-19 NOTE — Patient Instructions (Signed)
Message sent for your referral to carpal tunnel surgeon let me know if you do not hear within 1- 2 weeks.   Tramadol; Acetaminophen Tablets What is this medication? TRAMADOL; ACETAMINOPHEN (TRA ma dol; ea set a MEE noe fen) treats severe pain. It is prescribed when other pain medications have not worked or cannot be tolerated. It works by blocking pain signals in the brain. This medication is a combination of acetaminophen and an opioid. This medicine may be used for other purposes; ask your health care provider or pharmacist if you have questions. COMMON BRAND NAME(S): Ultracet What should I tell my care team before I take this medication? They need to know if you have any of these conditions: Brain tumor Frequently drink alcohol Head injury Heart disease Kidney disease or trouble passing urine Low adrenal gland function Lung disease, asthma, or breathing problems Seizures Stomach or intestine problems Substance use disorder Taken an MAOI like Marplan, Nardil, or Parnate in the last 14 days An unusual or allergic reaction to tramadol, acetaminophen, other medications, foods, dyes, or preservatives Pregnant or trying to get pregnant Breast-feeding How should I use this medication? Take this medication by mouth with a full glass of water. Follow the directions on the prescription label. If the medication upsets your stomach, take it with food or milk. Do not take your medication more often than directed. A special MedGuide will be given to you by the pharmacist with each prescription and refill. Be sure to read this information carefully each time. Talk to your care team about the use of this medication in children. Special care may be needed. This medication is not for use in children younger than 55 years of age. Do not give this medication to a child younger than 101 years of age after surgery to remove the tonsils and/or adenoids. Overdosage: If you think you have taken too much of this  medicine contact a poison control center or emergency room at once. NOTE: This medicine is only for you. Do not share this medicine with others. What if I miss a dose? If you miss a dose, take it as soon as you can. If it is almost time for your next dose, take only that dose. Do not take double or extra doses. What may interact with this medication? Do not take this medication with any of the following: Linezolid MAOIs like Marplan, Nardil, and Parnate Methylene blue Ozanimod Samidorphan This medication may also interact with the following: Alcohol Antihistamines for allergy, cough, and cold Atropine Certain antibiotics like erythromycin, clarithromycin, rifampin Certain antivirals for HIV or hepatitis Certain medications for anxiety or sleep Certain medications for bladder problems like oxybutynin, tolterodine Certain medications for depression like amitriptyline, bupropion, fluoxetine, paroxetine, sertraline Certain medications for fungal infections like ketoconazole, itraconazole, or posaconazole Certain medications for migraine headache like almotriptan, eletriptan, frovatriptan, naratriptan, rizatriptan, sumatriptan, zolmitriptan Certain medications for Parkinson disease like benztropine, trihexyphenidyl Certain medications for seizures like carbamazepine, phenobarbital, phenytoin, primidone Certain medications for stomach problems like dicyclomine, hyoscyamine Certain medications for travel sickness like scopolamine Digoxin Diuretics General anesthetics like halothane, isoflurane, methoxyflurane, propofol Ipratropium Medications that relax muscles for surgery Other medications with acetaminophen Other opioid medications for pain Phenothiazines like chlorpromazine, mesoridazine, prochlorperazine, thioridazine Quinidine Warfarin This list may not describe all possible interactions. Give your health care provider a list of all the medicines, herbs, non-prescription drugs, or  dietary supplements you use. Also tell them if you smoke, drink alcohol, or use illegal drugs. Some items may interact  with your medicine. What should I watch for while using this medication? Tell your care team if your pain does not go away, if it gets worse, or if you have new or a different type of pain. You may develop tolerance to this medication. Tolerance means that you will need a higher dose of the medication for pain relief. Tolerance is normal and is expected if you take this medication for a long time. Do not suddenly stop taking your medication because you may develop a severe reaction. Your body becomes used to the medication. This does NOT mean you are addicted. Addiction is a behavior related to getting and using a medication for a nonmedical reason. If you have pain, you have a medical reason to take pain medication. Your care team will tell you how much medication to take. If your care team wants you to stop the medication, the dose will be slowly lowered over time to avoid any side effects. There are different types of medications for pain. If you take more than one type at the same time, you may have more side effects. Give your care team a list of all medications you use. They will tell you how much medication to take. Do not take more medication than directed. Call emergency services if you have problems breathing. Naloxone is an emergency medication used for an opioid overdose. An overdose can happen if you take too much opioid. It can also happen if an opioid is taken with some other medications or substances such as alcohol. Know the symptoms of an overdose, like trouble breathing, unusually tired or sleepy, or not being able to respond or wake up. Make sure to tell caregivers and close contacts where your naloxone stored. Make sure they know how to use it. After naloxone is given, the person giving it must call emergency services. Naloxone is a temporary treatment. Repeat doses may be  needed. Do not take other medications that contain acetaminophen with this medication. Many non-prescription medications contain acetaminophen. Always read labels carefully. If you have questions, ask your care team. If you take too much acetaminophen, get medical help right away. Too much acetaminophen can be very dangerous and cause liver damage. Even if you do not have symptoms, it is important to get help right away. This medication may cause serious skin reactions. They can happen weeks to months after starting the medication. Contact your care team right away if you notice fevers or flu-like symptoms with a rash. The rash may be red or purple and then turn into blisters or peeling of the skin. Or, you might notice a red rash with swelling of the face, lips or lymph nodes in your neck or under your arms. This medication may affect your coordination, reaction time, or judgment. Do not drive or operate machinery until you know how this medication affects you. Sit up or stand slowly to reduce the risk of dizzy or fainting spells. Drinking alcohol with this medication can increase the risk of these side effects. This medication will cause constipation. If you do not have a bowel movement for 3 days, call your care team. Your mouth may get dry. Chewing sugarless gum or sucking hard candy and drinking plenty of water may help. Contact your care team if the problem does not go away or is severe. What side effects may I notice from receiving this medication? Side effects that you should report to your care team as soon as possible: Allergic reactions--skin rash, itching, hives, swelling  of the face, lips, tongue, or throat CNS depression--slow or shallow breathing, shortness of breath, feeling faint, dizziness, confusion, trouble staying awake Liver injury--right upper belly pain, loss of appetite, nausea, light-colored stool, dark yellow or brown urine, yellowing skin or eyes, unusual weakness or  fatigue Low adrenal gland function--nausea, vomiting, loss of appetite, unusual weakness or fatigue, dizziness Low blood pressure--dizziness, feeling faint or lightheaded, blurry vision Low blood sugar (hypoglycemia)--tremors or shaking, anxiety, sweating, cold or clammy skin, confusion, dizziness, rapid heartbeat Low sodium level--muscle weakness, fatigue, dizziness, headache, confusion Redness, blistering, peeling, or loosening of the skin, including inside the mouth Seizures Side effects that usually do not require medical attention (report to your care team if they continue or are bothersome): Constipation Dizziness Drowsiness Dry mouth Headache Nausea Trouble sleeping Upset stomach Vomiting This list may not describe all possible side effects. Call your doctor for medical advice about side effects. You may report side effects to FDA at 1-800-FDA-1088. Where should I keep my medication? Keep out of the reach of children and pets. This medication can be abused. Keep it in a safe place to protect it from theft. Do not share it with anyone. It is only for you. Selling or giving away this medication is dangerous and against the law. Store at room temperature between 20 and 25 degrees C (68 and 77 degrees F). Get rid of any unused medication after the expiration date. This medication may cause harm and death if it is taken by other adults, children, or pets. It is important to get rid of the medication as soon as you no longer need it or it is expired. You can do this in two ways: Take the medication to a medication take-back program. Check with your pharmacy or law enforcement to find a location. If you cannot return the medication, check the label or package insert to see if the medication should be thrown out in the garbage or flushed down the toilet. If you are not sure, ask your care team. If it is safe to put it in the trash, take the medication out of the container. Mix the medication  with cat litter, dirt, coffee grounds, or other unwanted substance. Seal the mixture in a bag or container. Put it in the trash. NOTE: This sheet is a summary. It may not cover all possible information. If you have questions about this medicine, talk to your doctor, pharmacist, or health care provider.  2022 Elsevier/Gold Standard (2021-05-08 00:00:00) Alprazolam Tablets What is this medication? ALPRAZOLAM (al PRAY zoe lam) treats anxiety. It works by Child psychotherapist system calm down. It belongs to a group of medications called benzodiazepines. This medicine may be used for other purposes; ask your health care provider or pharmacist if you have questions. COMMON BRAND NAME(S): Xanax What should I tell my care team before I take this medication? They need to know if you have any of these conditions: Depression or other mental health disease History of alcohol or drug abuse or addiction Kidney disease Liver disease Lung disease, asthma, or breathing problem Seizures Suicidal thoughts, plans or attempt An unusual or allergic reaction to alprazolam, other benzodiazepines, foods, dyes, or preservatives Pregnant or trying to get pregnant Breast-feeding How should I use this medication? Take this medication by mouth. Take it as directed on the prescription label. Do not take it more often than directed. Keep taking it unless your care team tells you to stop. A special MedGuide will be given to you by the  pharmacist with each prescription and refill. Be sure to read this information carefully each time. Talk to your care team about the use of this medication in children. Special care may be needed. Patients over 52 years of age may have a stronger reaction and need a smaller dose. Overdosage: If you think you have taken too much of this medicine contact a poison control center or emergency room at once. NOTE: This medicine is only for you. Do not share this medicine with others. What if I  miss a dose? If you miss a dose, take it as soon as you can. If it is almost time for your next dose, take only that dose. Do not take double or extra doses. What may interact with this medication? Do not take this medication with any of the following: Certain antivirals for HIV or hepatitis Certain medications for fungal infections like ketoconazole, itraconazole, or posaconazole Clarithromycin Grapefruit juice Narcotic medications for cough Sodium oxybate This medication may also interact with the following: Alcohol Antihistamines for allergy, cough and cold Certain medications for anxiety or sleep Certain medications for depression like amitriptyline, fluoxetine, fluvoxamine, nefazodone, sertraline Certain medications for seizures like carbamazepine, phenobarbital, phenytoin, primidone Cimetidine Digoxin Erythromycin Female hormones, like estrogens or progestins and birth control pills, patches, rings, or injections General anesthetics like halothane, isoflurane, methoxyflurane, propofol Medications that relax muscles Narcotic medications for pain Phenothiazines like chlorpromazine, mesoridazine, prochlorperazine, thioridazine This list may not describe all possible interactions. Give your health care provider a list of all the medicines, herbs, non-prescription drugs, or dietary supplements you use. Also tell them if you smoke, drink alcohol, or use illegal drugs. Some items may interact with your medicine. What should I watch for while using this medication? Visit your care team for regular checks on your progress. Tell your care team if your symptoms do not start to get better or if they get worse. Do not stop taking except on your care team's advice. You may develop a severe reaction. Your care team will tell you how much medication to take. You may get drowsy or dizzy. Do not drive, use machinery, or do anything that needs mental alertness until you know how this medication  affects you. To reduce the risk of dizzy and fainting spells, do not stand or sit up quickly, especially if you are an older patient. Alcohol may increase dizziness and drowsiness. Avoid alcoholic drinks. If you are taking another medication that also causes drowsiness, you may have more side effects. Give your care team a list of all medications you use. Your care team will tell you how much medication to take. Do not take more medication than directed. Call emergency services if you have problems breathing or unusual sleepiness. Women should inform their care team if they wish to become pregnant or think they might be pregnant. Do not breast-feed while taking this medication. Talk to your care team for more information. What side effects may I notice from receiving this medication? Side effects that you should report to your care team as soon as possible: Allergic reactions--skin rash, itching, hives, swelling of the face, lips, tongue, or throat CNS depression--slow or shallow breathing, shortness of breath, feeling faint, dizziness, confusion, trouble staying awake Thoughts of suicide or self-harm, worsening mood, feelings of depression Side effects that usually do not require medical attention (report to your care team if they continue or are bothersome): Change in sex drive or performance Dizziness Drowsiness Nausea This list may not describe all possible side  effects. Call your doctor for medical advice about side effects. You may report side effects to FDA at 1-800-FDA-1088. Where should I keep my medication? Keep out of the reach of children and pets. This medication can be abused. Keep it in a safe place to protect it from theft. Do not share it with anyone. It is only for you. Selling or giving away this medication is dangerous and against the law. Store at room temperature between 20 and 25 degrees C (68 and 77 degrees F). Get rid of any unused medication after the expiration date. This  medication may cause harm and death if it is taken by other adults, children, or pets. It is important to get rid of the medication as soon as you no longer need it, or it is expired. You can do this in two ways: Take the medication to a medication take-back program. Check with your pharmacy or law enforcement to find a location. If you cannot return the medication, check the label or package insert to see if the medication should be thrown out in the garbage or flushed down the toilet. If you are not sure, ask your care team. If it is safe to put it in the trash, take the medication out of the container. Mix the medication with cat litter, dirt, coffee grounds, or other unwanted substance. Seal the mixture in a bag or container. Put it in the trash. NOTE: This sheet is a summary. It may not cover all possible information. If you have questions about this medicine, talk to your doctor, pharmacist, or health care provider.  2022 Elsevier/Gold Standard (2020-11-09 00:00:00) Generalized Anxiety Disorder, Adult Generalized anxiety disorder (GAD) is a mental health condition. Unlike normal worries, anxiety related to GAD is not triggered by a specific event. These worries do not fade or get better with time. GAD interferes with relationships, work, and school. GAD symptoms can vary from mild to severe. People with severe GAD can have intense waves of anxiety with physical symptoms that are similar to panic attacks. What are the causes? The exact cause of GAD is not known, but the following are believed to have an impact: Differences in natural brain chemicals. Genes passed down from parents to children. Differences in the way threats are perceived. Development and stress during childhood. Personality. What increases the risk? The following factors may make you more likely to develop this condition: Being female. Having a family history of anxiety disorders. Being very shy. Experiencing very stressful  life events, such as the death of a loved one. Having a very stressful family environment. What are the signs or symptoms? People with GAD often worry excessively about many things in their lives, such as their health and family. Symptoms may also include: Mental and emotional symptoms: Worrying excessively about natural disasters. Fear of being late. Difficulty concentrating. Fears that others are judging your performance. Physical symptoms: Fatigue. Headaches, muscle tension, muscle twitches, trembling, or feeling shaky. Feeling like your heart is pounding or beating very fast. Feeling out of breath or like you cannot take a deep breath. Having trouble falling asleep or staying asleep, or experiencing restlessness. Sweating. Nausea, diarrhea, or irritable bowel syndrome (IBS). Behavioral symptoms: Experiencing erratic moods or irritability. Avoidance of new situations. Avoidance of people. Extreme difficulty making decisions. How is this diagnosed? This condition is diagnosed based on your symptoms and medical history. You will also have a physical exam. Your health care provider may perform tests to rule out other possible causes of  your symptoms. To be diagnosed with GAD, a person must have anxiety that: Is out of his or her control. Affects several different aspects of his or her life, such as work and relationships. Causes distress that makes him or her unable to take part in normal activities. Includes at least three symptoms of GAD, such as restlessness, fatigue, trouble concentrating, irritability, muscle tension, or sleep problems. Before your health care provider can confirm a diagnosis of GAD, these symptoms must be present more days than they are not, and they must last for 6 months or longer. How is this treated? This condition may be treated with: Medicine. Antidepressant medicine is usually prescribed for long-term daily control. Anti-anxiety medicines may be added in  severe cases, especially when panic attacks occur. Talk therapy (psychotherapy). Certain types of talk therapy can be helpful in treating GAD by providing support, education, and guidance. Options include: Cognitive behavioral therapy (CBT). People learn coping skills and self-calming techniques to ease their physical symptoms. They learn to identify unrealistic thoughts and behaviors and to replace them with more appropriate thoughts and behaviors. Acceptance and commitment therapy (ACT). This treatment teaches people how to be mindful as a way to cope with unwanted thoughts and feelings. Biofeedback. This process trains you to manage your body's response (physiological response) through breathing techniques and relaxation methods. You will work with a therapist while machines are used to monitor your physical symptoms. Stress management techniques. These include yoga, meditation, and exercise. A mental health specialist can help determine which treatment is best for you. Some people see improvement with one type of therapy. However, other people require a combination of therapies. Follow these instructions at home: Lifestyle Maintain a consistent routine and schedule. Anticipate stressful situations. Create a plan and allow extra time to work with your plan. Practice stress management or self-calming techniques that you have learned from your therapist or your health care provider. Exercise regularly and spend time outdoors. Eat a healthy diet that includes plenty of vegetables, fruits, whole grains, low-fat dairy products, and lean protein. Do not eat a lot of foods that are high in fat, added sugar, or salt (sodium). Drink plenty of water. Avoid alcohol. Alcohol can increase anxiety. Avoid caffeine and certain over-the-counter cold medicines. These may make you feel worse. Ask your pharmacist which medicines to avoid. General instructions Take over-the-counter and prescription medicines only  as told by your health care provider. Understand that you are likely to have setbacks. Accept this and be kind to yourself as you persist to take better care of yourself. Anticipate stressful situations. Create a plan and allow extra time to work with your plan. Recognize and accept your accomplishments, even if you judge them as small. Spend time with people who care about you. Keep all follow-up visits. This is important. Where to find more information [place]: https://carter.com/ Substance Abuse and Mental Health Services: ktimeonline.com Contact a health care provider if: Your symptoms do not get better. Your symptoms get worse. You have signs of depression, such as: A persistently sad or irritable mood. Loss of enjoyment in activities that used to bring you joy. Change in weight or eating. Changes in sleeping habits. Get help right away if: You have thoughts about hurting yourself or others. If you ever feel like you may hurt yourself or others, or have thoughts about taking your own life, get help right away. Go to your nearest emergency department or: Call your local emergency services (911 in the [place]  Call a suicide crisis helpline, such as the [place] at 210 602 1276 or 988 in the [place]. This is open 24 hours a day in the [place] Text the Crisis Text Line at 718 286 3604 (in the [place].). Summary Generalized anxiety disorder (GAD) is a mental health condition that involves worry that is not triggered by a specific event. People with GAD often worry excessively about many things in their lives, such as their health and family. GAD may cause symptoms such as restlessness, trouble concentrating, sleep problems, frequent sweating, nausea, diarrhea, headaches, and trembling or muscle twitching. A mental health specialist can help determine which treatment is best for you. Some people see improvement with one type of therapy. However, other  people require a combination of therapies. This information is not intended to replace advice given to you by your health care provider. Make sure you discuss any questions you have with your health care provider. Document Revised: 05/08/2021 Document Reviewed: 02/03/2021 Elsevier Patient Education  [place]. Acute Back Pain, Adult Acute back pain is sudden and usually short-lived. It is often caused by an injury to the muscles and tissues in the back. The injury may result from: A muscle, tendon, or ligament getting overstretched or torn. Ligaments are tissues that connect bones to each other. Lifting something improperly can cause a back strain. Wear and tear (degeneration) of the spinal disks. Spinal disks are circular tissue that provide cushioning between the bones of the spine (vertebrae). Twisting motions, such as while playing sports or doing yard work. A hit to the back. Arthritis. You may have a physical exam, lab tests, and imaging tests to find the cause of your pain. Acute back pain usually goes away with rest and home care. Follow these instructions at home: Managing pain, stiffness, and swelling Take over-the-counter and prescription medicines only as told by your health care provider. Treatment may include medicines for pain and inflammation that are taken by mouth or applied to the skin, or muscle relaxants. Your health care provider may recommend applying ice during the first 24-48 hours after your pain starts. To do this: Put ice in a plastic bag. Place a towel between your skin and the bag. Leave the ice on for 20 minutes, 2-3 times a day. Remove the ice if your skin turns bright red. This is very important. If you cannot feel pain, heat, or cold, you have a greater risk of damage to the area. If directed, apply heat to the affected area as often as told by your health care provider. Use the heat source that your health care provider recommends, such as a moist heat  pack or a heating pad. Place a towel between your skin and the heat source. Leave the heat on for 20-30 minutes. Remove the heat if your skin turns bright red. This is especially important if you are unable to feel pain, heat, or cold. You have a greater risk of getting burned. Activity  Do not stay in bed. Staying in bed for more than 1-2 days can delay your recovery. Sit up and stand up straight. Avoid leaning forward when you sit or hunching over when you stand. If you work at a desk, sit close to it so you do not need to lean over. Keep your chin tucked in. Keep your neck drawn back, and keep your elbows bent at a 90-degree angle (right angle). Sit high and close to the steering wheel when you drive. Add lower back (lumbar) support to your  car seat, if needed. Take short walks on even surfaces as soon as you are able. Try to increase the length of time you walk each day. Do not sit, drive, or stand in one place for more than 30 minutes at a time. Sitting or standing for long periods of time can put stress on your back. Do not drive or use heavy machinery while taking prescription pain medicine. Use proper lifting techniques. When you bend and lift, use positions that put less stress on your back: Kersey your knees. Keep the load close to your body. Avoid twisting. Exercise regularly as told by your health care provider. Exercising helps your back heal faster and helps prevent back injuries by keeping muscles strong and flexible. Work with a physical therapist to make a safe exercise program, as recommended by your health care provider. Do any exercises as told by your physical therapist. Lifestyle Maintain a healthy weight. Extra weight puts stress on your back and makes it difficult to have good posture. Avoid activities or situations that make you feel anxious or stressed. Stress and anxiety increase muscle tension and can make back pain worse. Learn ways to manage anxiety and stress, such  as through exercise. General instructions Sleep on a firm mattress in a comfortable position. Try lying on your side with your knees slightly bent. If you lie on your back, put a pillow under your knees. Keep your head and neck in a straight line with your spine (neutral position) when using electronic equipment like smartphones or pads. To do this: Raise your smartphone or pad to look at it instead of bending your head or neck to look down. Put the smartphone or pad at the level of your face while looking at the screen. Follow your treatment plan as told by your health care provider. This may include: Cognitive or behavioral therapy. Acupuncture or massage therapy. Meditation or yoga. Contact a health care provider if: You have pain that is not relieved with rest or medicine. You have increasing pain going down into your legs or buttocks. Your pain does not improve after 2 weeks. You have pain at night. You lose weight without trying. You have a fever or chills. You develop nausea or vomiting. You develop abdominal pain. Get help right away if: You develop new bowel or bladder control problems. You have unusual weakness or numbness in your arms or legs. You feel faint. These symptoms may represent a serious problem that is an emergency. Do not wait to see if the symptoms will go away. Get medical help right away. Call your local emergency services (911 in the [place] Do not drive yourself to the hospital. Summary Acute back pain is sudden and usually short-lived. Use proper lifting techniques. When you bend and lift, use positions that put less stress on your back. Take over-the-counter and prescription medicines only as told by your health care provider, and apply heat or ice as told. This information is not intended to replace advice given to you by your health care provider. Make sure you discuss any questions you have with your health care provider. Document Revised: 01/04/2021  Document Reviewed: 01/04/2021 Elsevier Patient Education  [employer].

## 2021-11-19 NOTE — Progress Notes (Signed)
Established Patient Office Visit  Subjective:  Patient ID: Kathleen Carr, female    DOB: [birth year]  Age: 51 y.o. MRN: [id number]  CC:  Chief Complaint  Patient presents with   Medication Refill    Pt presents to office to refill Ambien and discuss about potentially being put back on metformin. Also c/o back pain 5/10 and wants to see if she could be prescribed something for it?    HPI [name] presents for follow up. Needs Ambien for insomnia works well.  Dr. Caprice Red, at [facility]. She had a disability evaluation and was told sedentary. Left side of leg improved , now right side worsen. She was not on workers compensation. She is no longer seeing Dr. Kayleen Memos at [facility]. Wants a referral to neurosurgery for evaluation given paresthesias both lower extremities. Still having back pain 5/10.   She was taking metformin, to aid with prediabetes and weight loss had some GI upset would like to try it again now.   Sleeps well with Ambien. Needs refill. No abnormal dreams.   Requests something for her back pain.  Her mom is currently not doing well. She would like to have something for anxiety that is situational at this time.  Patient  denies any fever,chills, rash, chest pain, shortness of breath, nausea, vomiting, or diarrhea.  Denies dizziness, lightheadedness, pre syncopal or syncopal episodes.      Past Medical History:  Diagnosis Date   Allergy    Anxiety    Arthritis    Asthma    Bipolar depression ([place])    Depression    Diabetes mellitus without complication (HCC)    GERD (gastroesophageal reflux disease)    Hypertension    PTSD (post-traumatic stress disorder)    PTSD (post-traumatic stress disorder)    Thyroid disease     Past Surgical History:  Procedure Laterality Date   ABDOMINAL HYSTERECTOMY     CHOLECYSTECTOMY     KNEE ARTHROSCOPY Bilateral    TONSILLECTOMY      Family History  Problem Relation Age of Onset   Hypertension Mother    Thyroid  disease Mother    Cancer Father 73       carcinoid, right lung   Ulcerative colitis Father    Glaucoma Maternal Grandmother    Renal Disease Maternal Grandfather    Diabetes Maternal Grandfather        Type2   Stroke Paternal Grandfather    Hypertension Paternal Grandfather     Social History   Socioeconomic History   Marital status: Married    Spouse name: Not on file   Number of children: Not on file   Years of education: Not on file   Highest education level: Not on file  Occupational History   Not on file  Tobacco Use   Smoking status: Some Days    Types: Cigarettes    Last attempt to quit: 05/25/2020    Years since quitting: 1.4   Smokeless tobacco: Never  Vaping Use   Vaping Use: Some days  Substance and Sexual Activity   Alcohol use: Not Currently   Drug use: Not Currently   Sexual activity: Not Currently    Partners: Female    Comment: married to wife  Other Topics Concern   Not on file  Social History Narrative   Not on file   Social Determinants of Health   Financial Resource Strain: Not on file  Food Insecurity: Not on file  Transportation Needs: Not  on file  Physical Activity: Not on file  Stress: Not on file  Social Connections: Not on file  Intimate Partner Violence: Not on file    Outpatient Medications Prior to Visit  Medication Sig Dispense Refill   albuterol (PROAIR HFA) 108 (90 Base) MCG/ACT inhaler Inhale 1-2 puffs into the lungs every 6 (six) hours as needed for wheezing. 8.5 g 1   cholecalciferol (VITAMIN D3) 25 MCG (1000 UNIT) tablet Take 1,000 Units by mouth daily.     levothyroxine (SYNTHROID) 50 MCG tablet TAKE 1 TABLET BY MOUTH DAILY BEFORE BREAKFAST. 90 tablet 1   loratadine (CLARITIN) 10 MG tablet Take 10 mg by mouth daily.     nystatin (MYCOSTATIN) 100000 UNIT/ML suspension Use as needed up to four times daily 20m in mouth swish hold and spit. 60 mL 0   oxybutynin (DITROPAN XL) 10 MG 24 hr tablet Take 1 tablet (10 mg total) by  mouth at bedtime. 90 tablet 1   pantoprazole (PROTONIX) 40 MG tablet Take 1 tablet (40 mg total) by mouth daily. 90 tablet 1   QUEtiapine (SEROQUEL) 300 MG tablet TAKE 1 TABLET (300 MG TOTAL) BY MOUTH AT BEDTIME. 90 tablet 1   rosuvastatin (CRESTOR) 5 MG tablet TAKE 1 TABLET BY MOUTH DAILY. 90 tablet 1   sertraline (ZOLOFT) 100 MG tablet TAKE 1 TABLET BY MOUTH DAILY 90 tablet 0   zolpidem (AMBIEN) 5 MG tablet TAKE 1 TABLET (5 MG TOTAL) BY MOUTH AT BEDTIME AS NEEDED FOR SLEEP. 30 tablet 3   doxycycline (VIBRAMYCIN) 100 MG capsule Take 1 capsule twice a day by oral route for 10 days. 20 capsule 0   oxyCODONE (ROXICODONE) 5 MG immediate release tablet Take 1 tablet (5 mg total) by mouth every 4 (four) hours as needed. (Patient not taking: Reported on 11/19/2021) 20 tablet 0   traMADol (ULTRAM) 50 MG tablet Take by mouth every 6 (six) hours as needed. Pt states not effective. (Patient not taking: Reported on 11/19/2021)     No facility-administered medications prior to visit.    Allergies  Allergen Reactions   Dust Mite Mixed Allergen Ext [Mite (D. 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- patient reports anaphylactic     ROS Review of Systems  Constitutional: Negative.   HENT: Negative.    Respiratory: Negative.    Cardiovascular: Negative.   Gastrointestinal: Negative.   Musculoskeletal:  Positive for arthralgias and back pain. Negative for gait problem, joint swelling, myalgias, neck pain and neck stiffness.  Skin: Negative.   Neurological:  Positive for numbness. Negative for dizziness, syncope and weakness.  Psychiatric/Behavioral:  Positive for sleep disturbance. Negative for decreased concentration, self-injury and suicidal ideas. The patient is nervous/anxious.      Objective:    Physical Exam  General: Appearance:    Mildly obese female in no acute distress   Eyes:    PERRL, conjunctiva/corneas clear, EOM's intact       Lungs:     Clear to auscultation bilaterally, respirations unlabored  Heart:    Normal heart rate. Normal rhythm. No murmurs, rubs, or gallops.    MS:   All extremities are intact.    Neurologic:   Awake, alert, oriented x 3. No apparent focal neurological           defect.     BP 118/70 (BP Location:  Left Arm, Patient Position: Sitting, Cuff Size: Large)    Pulse 72    Temp 98 F (36.7 C) (Oral)    Resp 16    Ht 5' 4"  (1.626 m)    Wt 180 lb 6.4 oz (81.8 kg)    SpO2 98%    BMI 30.97 kg/m  Wt Readings from Last 3 Encounters:  11/19/21 180 lb 6.4 oz (81.8 kg)  10/04/21 173 lb 9.5 oz (78.7 kg)  10/01/21 173 lb (78.5 kg)     Health Maintenance Due  Topic Date Due   FOOT EXAM  Never done   OPHTHALMOLOGY EXAM  Never done   HIV Screening  Never done   Hepatitis C Screening  Never done   TETANUS/TDAP  Never done   PAP SMEAR-Modifier  Never done   COLONOSCOPY (Pts 45-83yr Insurance coverage will need to be confirmed)  Never done   COVID-19 Vaccine (4 - Booster for [place]) 12/26/2020   MAMMOGRAM  Never done   Zoster Vaccines- Shingrix (1 of 2) Never done    There are no preventive care reminders to display for this patient.  Lab Results  Component Value Date   TSH 0.803 01/14/2021   Lab Results  Component Value Date   WBC 12.8 (H) 10/04/2021   HGB 14.5 10/04/2021   HCT 42.6 10/04/2021   MCV 93.0 10/04/2021   PLT 374 10/04/2021   Lab Results  Component Value Date   NA 136 10/04/2021   K 3.7 10/04/2021   CO2 27 10/04/2021   GLUCOSE 123 (H) 10/04/2021   BUN 11 10/04/2021   CREATININE 0.73 10/04/2021   BILITOT 0.5 10/04/2021   ALKPHOS 65 10/04/2021   AST 24 10/04/2021   ALT 21 10/04/2021   PROT 7.3 10/04/2021   ALBUMIN 4.3 10/04/2021   CALCIUM 9.5 10/04/2021   ANIONGAP 9 10/04/2021   EGFR 83 01/14/2021   Lab Results  Component Value Date   CHOL 187 08/16/2021   Lab Results  Component Value  Date   HDL 56.10 08/16/2021   Lab Results  Component Value Date   LDLCALC 106 (H) 08/02/2020   Lab Results  Component Value Date   TRIG 214.0 (H) 08/16/2021   Lab Results  Component Value Date   CHOLHDL 3 08/16/2021   Lab Results  Component Value Date   HGBA1C 5.7 (H) 07/19/2021      Assessment & Plan:   Problem List Items Addressed This Visit       Digestive   Gastroesophageal reflux disease     Endocrine   Controlled diabetes mellitus type 2 with complications ([place]   Relevant Medications   metFORMIN (GLUCOPHAGE) 500 MG tablet   Hypothyroidism    TSH in needs recheck.  Labs ordered.       Relevant Orders   TSH     Nervous and Auditory   Back pain of lumbar region with sciatica    Ultram as prescribed PRN only. Not with any other sedative or alcohol. Neurosurgery evaluation.       Relevant Medications   zolpidem (AMBIEN) 5 MG tablet   cyclobenzaprine (FLEXERIL) 10 MG tablet   traMADol (ULTRAM) 50 MG tablet   ALPRAZolam (XANAX) 0.25 MG tablet   Other Relevant Orders   DG Lumbar Spine Complete (Completed)   Ambulatory referral to Neurosurgery     Other   Insomnia - Primary    Takes Ambien PRN works well no vivid dreams.        Relevant  Medications   zolpidem (AMBIEN) 5 MG tablet   Other Relevant Orders   Ambulatory referral to Gastroenterology   CBC   Comprehensive metabolic panel   TSH   Bipolar disorder, currently in remission ([place])   Nausea   Relevant Orders   Ambulatory referral to Gastroenterology   Hemoglobin A1c   Situational anxiety    Mom not doing well. Xanax only as needed PRN not with any other sedative or pain medication advised.      Relevant Medications   ALPRAZolam (XANAX) 0.25 MG tablet   Vitamin D deficiency   Relevant Orders   Vitamin D 1,25 dihydroxy   Pain of right thumb   Relevant Orders   DG Hand Complete Right (Completed)   History of lumbar surgery    Still having pain. Will x ray today and refer to  neurosurgery for evaluation.       Relevant Orders   DG Lumbar Spine Complete (Completed)   Ambulatory referral to Neurosurgery    Meds ordered this encounter  Medications   zolpidem (AMBIEN) 5 MG tablet    Sig: TAKE 1 TABLET (5 MG TOTAL) BY MOUTH AT BEDTIME AS NEEDED FOR SLEEP.    Dispense:  30 tablet    Refill:  3   cyclobenzaprine (FLEXERIL) 10 MG tablet    Sig: Take 1 tablet (10 mg total) by mouth 3 (three) times daily as needed for muscle spasms (will cause drowsiness.).    Dispense:  30 tablet    Refill:  0   metFORMIN (GLUCOPHAGE) 500 MG tablet    Sig: Take 1 tablet (500 mg total) by mouth 2 (two) times daily with a meal.    Dispense:  180 tablet    Refill:  3   traMADol (ULTRAM) 50 MG tablet    Sig: Take 1-2 tablets (50-100 mg total) by mouth daily as needed.    Dispense:  60 tablet    Refill:  0   ALPRAZolam (XANAX) 0.25 MG tablet    Sig: Take 1-2 tablets (0.25-0.5 mg total) by mouth 2 (two) times daily as needed for anxiety (will cause drowsiness.).    Dispense:  30 tablet    Refill:  0   Orders Placed This Encounter  Procedures   DG Lumbar Spine Complete    Order Specific Question:   Reason for Exam (SYMPTOM  OR DIAGNOSIS REQUIRED)    Answer:   lower back pain with surgery and pain now in right leg hip    Order Specific Question:   Is patient pregnant?    Answer:   No    Order Specific Question:   Preferred imaging location?    Answer:   [place]   DG Hand Complete Right    Order Specific Question:   Reason for Exam (SYMPTOM  OR DIAGNOSIS REQUIRED)    Answer:   right hand - thumb pain look for retained body    Order Specific Question:   Is patient pregnant?    Answer:   No    Order Specific Question:   Preferred imaging location?    Answer:   [place]   CBC    Standing Status:   Future    Standing Expiration Date:   03/19/2022   Comprehensive metabolic panel    Standing Status:   Future    Standing Expiration Date:    03/19/2022   TSH    Standing Status:   Future    Standing Expiration Date:  03/19/2022   Vitamin D 1,25 dihydroxy    Standing Status:   Future    Standing Expiration Date:   11/19/2022   Hemoglobin A1c    Standing Status:   Future    Standing Expiration Date:   11/19/2022   Ambulatory referral to Gastroenterology    Referral Priority:   Routine    Referral Type:   Consultation    Referral Reason:   Specialty Services Required    Number of Visits Requested:   1   Ambulatory referral to Neurosurgery    Referral Priority:   Routine    Referral Type:   Surgical    Referral Reason:   Specialty Services Required    Referred to Provider:   Meade Maw, MD    Requested Specialty:   Neurosurgery    Number of Visits Requested:   1     Red Flags discussed. The patient was given clear instructions to go to ER or return to medical center if any red flags develop, symptoms do not improve, worsen or new problems develop. They verbalized understanding.  Follow-up: Return in about 1 month (around 12/20/2021), or if symptoms worsen or fail to improve, for at any time for any worsening symptoms, Go to Emergency room/ urgent care if worse.    Marcille Buffy, FNP

## 2021-11-20 NOTE — Progress Notes (Signed)
X ray dose show possible small foreign body in thumb - she can walk in at [facility]. 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  If she needs to and thumb is still sore.  If she needs additional assistance please let me know.

## 2021-11-21 ENCOUNTER — Encounter: Payer: Self-pay | Admitting: Adult Health

## 2021-11-21 DIAGNOSIS — R110 Nausea: Secondary | ICD-10-CM | POA: Insufficient documentation

## 2021-11-21 DIAGNOSIS — E039 Hypothyroidism, unspecified: Secondary | ICD-10-CM | POA: Insufficient documentation

## 2021-11-21 DIAGNOSIS — F418 Other specified anxiety disorders: Secondary | ICD-10-CM | POA: Insufficient documentation

## 2021-11-21 DIAGNOSIS — M5440 Lumbago with sciatica, unspecified side: Secondary | ICD-10-CM | POA: Insufficient documentation

## 2021-11-21 DIAGNOSIS — Z9889 Other specified postprocedural states: Secondary | ICD-10-CM | POA: Insufficient documentation

## 2021-11-21 DIAGNOSIS — M79644 Pain in right finger(s): Secondary | ICD-10-CM | POA: Insufficient documentation

## 2021-11-21 DIAGNOSIS — E559 Vitamin D deficiency, unspecified: Secondary | ICD-10-CM | POA: Insufficient documentation

## 2021-11-21 NOTE — Assessment & Plan Note (Signed)
Takes Ambien PRN works well no vivid dreams.

## 2021-11-21 NOTE — Assessment & Plan Note (Signed)
TSH in needs recheck.  Labs ordered.

## 2021-11-21 NOTE — Assessment & Plan Note (Signed)
Ultram as prescribed PRN only. Not with any other sedative or alcohol. Neurosurgery evaluation.

## 2021-11-21 NOTE — Assessment & Plan Note (Signed)
Mom not doing well. Xanax only as needed PRN not with any other sedative or pain medication advised.

## 2021-11-21 NOTE — Progress Notes (Signed)
Mild Multilevel degenerative disc change, given her history of lumbar surgery and if persistent symptoms, MRI may be warranted. If she would like to be referred to another provider for her back since she is no longer seeing her orthopedic let me know and I can place a referral.

## 2021-11-21 NOTE — Assessment & Plan Note (Signed)
Still having pain. Will x ray today and refer to neurosurgery for evaluation.

## 2021-11-26 ENCOUNTER — Encounter: Payer: Self-pay | Admitting: Adult Health

## 2021-11-27 ENCOUNTER — Other Ambulatory Visit: Payer: Self-pay

## 2021-11-27 ENCOUNTER — Other Ambulatory Visit (INDEPENDENT_AMBULATORY_CARE_PROVIDER_SITE_OTHER): Payer: 59

## 2021-11-27 DIAGNOSIS — R110 Nausea: Secondary | ICD-10-CM | POA: Diagnosis not present

## 2021-11-27 DIAGNOSIS — E039 Hypothyroidism, unspecified: Secondary | ICD-10-CM | POA: Diagnosis not present

## 2021-11-27 DIAGNOSIS — G4700 Insomnia, unspecified: Secondary | ICD-10-CM

## 2021-11-27 DIAGNOSIS — E559 Vitamin D deficiency, unspecified: Secondary | ICD-10-CM | POA: Diagnosis not present

## 2021-11-27 LAB — COMPREHENSIVE METABOLIC PANEL
ALT: 17 U/L (ref 0–35)
AST: 15 U/L (ref 0–37)
Albumin: 4.5 g/dL (ref 3.5–5.2)
Alkaline Phosphatase: 65 U/L (ref 39–117)
BUN: 13 mg/dL (ref 6–23)
CO2: 32 mEq/L (ref 19–32)
Calcium: 9.8 mg/dL (ref 8.4–10.5)
Chloride: 102 mEq/L (ref 96–112)
Creatinine, Ser: 0.84 mg/dL (ref 0.40–1.20)
GFR: 81.17 mL/min (ref >60.00)
Glucose, Bld: 105 mg/dL — ABNORMAL HIGH (ref 70–99)
Potassium: 4.8 mEq/L (ref 3.5–5.1)
Sodium: 139 mEq/L (ref 135–145)
Total Bilirubin: 0.4 mg/dL (ref 0.2–1.2)
Total Protein: 7 g/dL (ref 6.0–8.3)

## 2021-11-27 LAB — CBC
HCT: 41.9 % (ref 36.0–46.0)
Hemoglobin: 14.1 g/dL (ref 12.0–15.0)
MCHC: 33.7 g/dL (ref 30.0–36.0)
MCV: 93.3 fl (ref 78.0–100.0)
Platelets: 310 10*3/uL (ref 150.0–400.0)
RBC: 4.49 Mil/uL (ref 3.87–5.11)
RDW: 12.5 % (ref 11.5–15.5)
WBC: 10.2 10*3/uL (ref 4.0–10.5)

## 2021-11-27 LAB — HEMOGLOBIN A1C: Hgb A1c MFr Bld: 6.6 % — ABNORMAL HIGH (ref 4.6–6.5)

## 2021-11-27 LAB — TSH: TSH: 2.11 u[IU]/mL (ref 0.35–5.50)

## 2021-11-27 LAB — VITAMIN D 25 HYDROXY (VIT D DEFICIENCY, FRACTURES): VITD: 45.44 ng/mL (ref 30.00–100.00)

## 2021-11-27 NOTE — Progress Notes (Signed)
CMP mild elevation in glucose. CBC is within normal limits. TSH within normal limits- continue current synthroid dose. .  Vitamin D ok stable. Continue supplement. Hemoglobin A1C is elevated, diet and exercise as discussed continue, and we just started Metformin back at 500 mg one tablet twice daily. We can continue this dose and recheck A1C, TSH and vitamin D in 3 months at lab visit, please add and schedule.

## 2021-11-28 ENCOUNTER — Other Ambulatory Visit: Payer: Self-pay

## 2021-11-28 ENCOUNTER — Telehealth: Payer: Self-pay

## 2021-11-28 DIAGNOSIS — E559 Vitamin D deficiency, unspecified: Secondary | ICD-10-CM

## 2021-11-28 DIAGNOSIS — E039 Hypothyroidism, unspecified: Secondary | ICD-10-CM

## 2021-11-28 DIAGNOSIS — E118 Type 2 diabetes mellitus with unspecified complications: Secondary | ICD-10-CM

## 2021-11-28 NOTE — Telephone Encounter (Signed)
Lvm for pt to return call in regards to lab results and to get scheduled for future labs.

## 2021-12-09 ENCOUNTER — Other Ambulatory Visit: Payer: Self-pay

## 2021-12-17 ENCOUNTER — Telehealth (INDEPENDENT_AMBULATORY_CARE_PROVIDER_SITE_OTHER): Payer: 59 | Admitting: Family Medicine

## 2021-12-17 ENCOUNTER — Encounter: Payer: Self-pay | Admitting: Family Medicine

## 2021-12-17 ENCOUNTER — Other Ambulatory Visit: Payer: Self-pay

## 2021-12-17 VITALS — HR 73 | Ht 64.0 in | Wt 180.0 lb

## 2021-12-17 DIAGNOSIS — R6889 Other general symptoms and signs: Secondary | ICD-10-CM | POA: Diagnosis not present

## 2021-12-17 NOTE — Patient Instructions (Signed)
°  HOME CARE TIPS:  -COVID19 testing information: ForwardDrop.tn  Most pharmacies also offer testing and home test kits. If the Covid19 test is positive and you desire antiviral treatment, please contact a [facility] or schedule a follow up virtual visit through your primary care office or through the [employer] [facility].  Other test to treat options: ConnectRV.is?click_source=alert  -warm salt water gargles twice dialy  -can use tylenol if needed for fevers, aches and pains per instructions  -can use nasal saline a few times per day if you have nasal congestion  -stay hydrated, drink plenty of fluids and eat small healthy meals - avoid dairy  -can take 1000 IU (83mcg) Vit D3 and 100-500 mg of Vit C daily per instructions  -If the Covid test is positive, check out the [facility] website for more information on home care, transmission and treatment for COVID19  -follow up with your doctor in 1-2 days unless improving and feeling better  -stay home while sick, except to seek medical care. If you have COVID19, you will likely be contagious for 7-10 days. Flu or Influenza is likely contagious for about 7 days. Other respiratory viral infections remain contagious for 5-10+ days depending on the virus and many other factors. Wear a good mask that fits snugly (such as N95 or KN95) if around others to reduce the risk of transmission.  It was nice to meet you today, and I really hope you are feeling better soon. I help [place] out with telemedicine visits on Tuesdays and Thursdays and am happy to help if you need a follow up virtual visit on those days. Otherwise, if you have any concerns or questions following this visit please schedule a follow up visit with your Primary Care doctor or seek care at a local urgent care clinic to avoid delays in care.    Seek in person care or schedule a follow up video visit promptly if your symptoms worsen,  new concerns arise or you are not improving with treatment. Call 911 and/or seek emergency care if your symptoms are severe or life threatening.

## 2021-12-17 NOTE — Progress Notes (Signed)
Virtual Visit via Video Note  I connected with Kathleen Carr  on 12/17/21 at 10:00 AM EST by a video enabled telemedicine application and verified that I am speaking with the correct person using two identifiers.  Location patient: [place] Location provider:work or home office Persons participating in the virtual visit: patient, provider  I discussed the limitations and requested verbal permission for telemedicine visit. The patient expressed understanding and agreed to proceed.   HPI:  Acute telemedicine visit for sore throat and sinus issues: -Onset: yesterday -Symptoms include: low grade temp high of 100.2, body aches, scratchy throat, headache, ear issues -Denies:SOB, CP, NVD, loss of taste and smell, inability to eat/drink/get out of bed -no known sick contacts -Pertinent past medical history: see below, had covid in the past -Pertinent medication allergies: Allergies  Allergen Reactions   Dust Mite Mixed Allergen Ext [Mite (D. 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  Past Medical History:  Diagnosis Date   Allergy    Anxiety    Arthritis    Asthma    Bipolar depression ([place])    Depression    Diabetes mellitus without complication (HCC)    GERD (gastroesophageal reflux disease)    Hypertension    PTSD (post-traumatic stress disorder)    PTSD (post-traumatic stress disorder)    Thyroid disease     Past Surgical History:  Procedure Laterality Date   ABDOMINAL HYSTERECTOMY     CHOLECYSTECTOMY     KNEE ARTHROSCOPY Bilateral     TONSILLECTOMY       Current Outpatient Medications:    albuterol (PROAIR HFA) 108 (90 Base) MCG/ACT inhaler, Inhale 1-2 puffs into the lungs every 6 (six) hours as needed for wheezing., Disp: 8.5 g, Rfl: 1   ALPRAZolam (XANAX) 0.25 MG tablet, Take 1-2 tablets (0.25-0.5 mg total) by mouth 2 (two) times daily as needed for anxiety (will cause drowsiness.)., Disp: 30 tablet, Rfl: 0   cholecalciferol (VITAMIN D3) 25 MCG (1000 UNIT) tablet, Take 1,000 Units by mouth daily., Disp: , Rfl:    cyclobenzaprine (FLEXERIL) 10 MG tablet, Take 1 tablet (10 mg total) by mouth 3 (three) times daily as needed for muscle spasms (will cause drowsiness.)., Disp: 30 tablet, Rfl: 0   levothyroxine (SYNTHROID) 50 MCG tablet, TAKE 1 TABLET BY MOUTH DAILY BEFORE BREAKFAST., Disp: 90 tablet, Rfl: 1   loratadine (CLARITIN) 10 MG tablet, Take 10 mg by mouth daily., Disp: , Rfl:    metFORMIN (GLUCOPHAGE) 500 MG tablet, Take 1 tablet (500 mg total) by mouth 2 (two) times daily with a meal., Disp: 180 tablet, Rfl: 3   nystatin (MYCOSTATIN) 100000 UNIT/ML suspension, Use as needed up to four times daily 25ml in mouth swish hold and spit., Disp: 60 mL, Rfl: 0   oxybutynin (DITROPAN XL) 10 MG 24 hr tablet, Take 1 tablet (10 mg total) by mouth at bedtime., Disp: 90 tablet, Rfl: 1   pantoprazole (PROTONIX) 40 MG tablet, Take 1 tablet (40 mg total) by mouth daily., Disp: 90 tablet, Rfl: 1   QUEtiapine (SEROQUEL) 300 MG tablet, TAKE 1 TABLET (  300 MG TOTAL) BY MOUTH AT BEDTIME., Disp: 90 tablet, Rfl: 1   rosuvastatin (CRESTOR) 5 MG tablet, TAKE 1 TABLET BY MOUTH DAILY., Disp: 90 tablet, Rfl: 1   sertraline (ZOLOFT) 100 MG tablet, TAKE 1 TABLET BY MOUTH DAILY, Disp: 90 tablet, Rfl: 0   traMADol (ULTRAM) 50 MG tablet, Take 1-2 tablets (50-100 mg total) by mouth daily as needed., Disp: 60 tablet, Rfl: 0   zolpidem (AMBIEN) 5 MG tablet, TAKE 1 TABLET (5 MG TOTAL) BY MOUTH AT BEDTIME AS NEEDED FOR SLEEP., Disp: 30 tablet, Rfl:  3  EXAM:  VITALS per patient if applicable:  GENERAL: alert, oriented, appears well and in no acute distress  HEENT: atraumatic, conjunttiva clear, no obvious abnormalities on inspection of external nose and ears, moist mucus membranes, mild post oropharyngeal erythema without tonsillar exudate/edema  NECK: normal movements of the head and neck  LUNGS: on inspection no signs of respiratory distress, breathing rate appears normal, no obvious gross SOB, gasping or wheezing  CV: no obvious cyanosis  MS: moves all visible extremities without noticeable abnormality  PSYCH/NEURO: pleasant and cooperative, no obvious depression or anxiety, speech and thought processing grossly intact  ASSESSMENT AND PLAN:  Discussed the following assessment and plan:  Flu-like symptoms  -we discussed possible serious and likely etiologies, options for evaluation and workup, limitations of telemedicine visit vs in person visit, treatment, treatment risks and precautions. Pt is agreeable to treatment via telemedicine at this moment. Query covid, flu, other viral etiology or other. Also discussed possibility of strep - though exam does not support that. She says she can do covid testing at home. Offered flu and strep testing, but she would prefer to not drive to our clinic today. Discussed other options to pursue this testing as well. She has opted to do home covid testing, nasal saline, salt water gargles and agrees to got to seek inperson care if covid testing neg and any worsening or not improving. Is aware can contact [facility] for antiviral if covid test positive.  Work/School slipped offered: declined  Advised to seek prompt virtual visit or in person care if worsening, new symptoms arise, or if is not improving with treatment as expected per our conversation of expected course. Discussed options for follow up care. Did let this patient know that I do telemedicine on Tuesdays and Thursdays for  [facility] and those are the days I am logged into the system. Advised to schedule follow up visit with PCP,  virtual visits or [facility] if any further questions or concerns to avoid delays in care.   I discussed the assessment and treatment plan with the patient. The patient was provided an opportunity to ask questions and all were answered. The patient agreed with the plan and demonstrated an understanding of the instructions.     Lucretia Kern, DO

## 2021-12-19 ENCOUNTER — Ambulatory Visit: Payer: Medicaid Other | Admitting: Surgery

## 2021-12-25 ENCOUNTER — Ambulatory Visit (INDEPENDENT_AMBULATORY_CARE_PROVIDER_SITE_OTHER): Payer: 59

## 2021-12-25 ENCOUNTER — Encounter: Payer: Self-pay | Admitting: Adult Health

## 2021-12-25 ENCOUNTER — Other Ambulatory Visit: Payer: Self-pay

## 2021-12-25 ENCOUNTER — Emergency Department: Payer: 59

## 2021-12-25 ENCOUNTER — Ambulatory Visit (INDEPENDENT_AMBULATORY_CARE_PROVIDER_SITE_OTHER): Payer: 59 | Admitting: Adult Health

## 2021-12-25 ENCOUNTER — Telehealth: Payer: Self-pay | Admitting: Adult Health

## 2021-12-25 ENCOUNTER — Telehealth: Payer: Self-pay

## 2021-12-25 ENCOUNTER — Emergency Department
Admission: EM | Admit: 2021-12-25 | Discharge: 2021-12-25 | Disposition: A | Discharge status: Home / Self Care | Payer: 59 | Attending: Emergency Medicine | Admitting: Emergency Medicine

## 2021-12-25 VITALS — BP 108/70 | HR 80 | Temp 98.2°F | Ht 64.0 in | Wt 180.0 lb

## 2021-12-25 DIAGNOSIS — R062 Wheezing: Secondary | ICD-10-CM

## 2021-12-25 DIAGNOSIS — B9689 Other specified bacterial agents as the cause of diseases classified elsewhere: Secondary | ICD-10-CM | POA: Diagnosis not present

## 2021-12-25 DIAGNOSIS — J069 Acute upper respiratory infection, unspecified: Secondary | ICD-10-CM | POA: Diagnosis not present

## 2021-12-25 DIAGNOSIS — R051 Acute cough: Secondary | ICD-10-CM | POA: Diagnosis not present

## 2021-12-25 DIAGNOSIS — R0602 Shortness of breath: Secondary | ICD-10-CM | POA: Diagnosis not present

## 2021-12-25 DIAGNOSIS — J40 Bronchitis, not specified as acute or chronic: Secondary | ICD-10-CM | POA: Insufficient documentation

## 2021-12-25 DIAGNOSIS — M5440 Lumbago with sciatica, unspecified side: Secondary | ICD-10-CM

## 2021-12-25 DIAGNOSIS — J029 Acute pharyngitis, unspecified: Secondary | ICD-10-CM | POA: Diagnosis not present

## 2021-12-25 DIAGNOSIS — D72829 Elevated white blood cell count, unspecified: Secondary | ICD-10-CM | POA: Insufficient documentation

## 2021-12-25 DIAGNOSIS — Z20822 Contact with and (suspected) exposure to covid-19: Secondary | ICD-10-CM | POA: Insufficient documentation

## 2021-12-25 DIAGNOSIS — R059 Cough, unspecified: Secondary | ICD-10-CM | POA: Diagnosis present

## 2021-12-25 LAB — CBC WITH DIFFERENTIAL/PLATELET
Abs Immature Granulocytes: 0.27 10*3/uL — ABNORMAL HIGH (ref 0.00–0.07)
Basophils Absolute: 0.1 K/uL (ref 0.0–0.1)
Basophils Absolute: 0.1 10*3/uL (ref 0.0–0.1)
Basophils Relative: 0.6 % (ref 0.0–3.0)
Basophils Relative: 1 %
Eosinophils Absolute: 0.1 K/uL (ref 0.0–0.7)
Eosinophils Absolute: 0.2 10*3/uL (ref 0.0–0.5)
Eosinophils Relative: 0.7 % (ref 0.0–5.0)
Eosinophils Relative: 1 %
HCT: 39.2 % (ref 36.0–46.0)
HCT: 37.9 % (ref 36.0–46.0)
Hemoglobin: 12.9 g/dL (ref 12.0–15.0)
Hemoglobin: 13 g/dL (ref 12.0–15.0)
Immature Granulocytes: 1 %
Lymphocytes Relative: 37.1 % (ref 12.0–46.0)
Lymphocytes Relative: 52 %
Lymphs Abs: 8.3 K/uL — ABNORMAL HIGH (ref 0.7–4.0)
Lymphs Abs: 9.9 10*3/uL — ABNORMAL HIGH (ref 0.7–4.0)
MCH: 31 pg (ref 26.0–34.0)
MCHC: 32.8 g/dL (ref 30.0–36.0)
MCHC: 34.3 g/dL (ref 30.0–36.0)
MCV: 93.2 fl (ref 78.0–100.0)
MCV: 90.5 fL (ref 80.0–100.0)
Monocytes Absolute: 1.4 K/uL — ABNORMAL HIGH (ref 0.1–1.0)
Monocytes Absolute: 0.7 10*3/uL (ref 0.1–1.0)
Monocytes Relative: 6.4 % (ref 3.0–12.0)
Monocytes Relative: 4 %
Neutro Abs: 12.3 K/uL — ABNORMAL HIGH (ref 1.4–7.7)
Neutro Abs: 7.8 10*3/uL — ABNORMAL HIGH (ref 1.7–7.7)
Neutrophils Relative %: 55.2 % (ref 43.0–77.0)
Neutrophils Relative %: 41 %
Platelets: 369 K/uL (ref 150.0–400.0)
Platelets: 389 10*3/uL (ref 150–400)
RBC: 4.21 Mil/uL (ref 3.87–5.11)
RBC: 4.19 MIL/uL (ref 3.87–5.11)
RDW: 13.1 % (ref 11.5–15.5)
RDW: 12.4 % (ref 11.5–15.5)
Smear Review: NORMAL
WBC Morphology: ABNORMAL
WBC: 22.4 K/uL (ref 4.0–10.5)
WBC: 18.8 10*3/uL — ABNORMAL HIGH (ref 4.0–10.5)
nRBC: 0 % (ref 0.0–0.2)

## 2021-12-25 LAB — RESP PANEL BY RT-PCR (FLU A&B, COVID) ARPGX2
Influenza A by PCR: NEGATIVE
Influenza B by PCR: NEGATIVE
SARS Coronavirus 2 by RT PCR: NEGATIVE

## 2021-12-25 LAB — URINALYSIS, ROUTINE W REFLEX MICROSCOPIC
Bilirubin Urine: NEGATIVE
Glucose, UA: NEGATIVE mg/dL
Hgb urine dipstick: NEGATIVE
Ketones, ur: NEGATIVE mg/dL
Leukocytes,Ua: NEGATIVE
Nitrite: NEGATIVE
Protein, ur: NEGATIVE mg/dL
Specific Gravity, Urine: 1.002 — ABNORMAL LOW (ref 1.005–1.030)
pH: 6 (ref 5.0–8.0)

## 2021-12-25 LAB — COMPREHENSIVE METABOLIC PANEL
ALT: 21 U/L (ref 0–44)
AST: 22 U/L (ref 15–41)
Albumin: 3.9 g/dL (ref 3.5–5.0)
Alkaline Phosphatase: 69 U/L (ref 38–126)
Anion gap: 7 (ref 5–15)
BUN: 15 mg/dL (ref 6–20)
CO2: 25 mmol/L (ref 22–32)
Calcium: 8.9 mg/dL (ref 8.9–10.3)
Chloride: 104 mmol/L (ref 98–111)
Creatinine, Ser: 0.79 mg/dL (ref 0.44–1.00)
GFR, Estimated: >60 mL/min (ref >60)
Glucose, Bld: 96 mg/dL (ref 70–99)
Potassium: 3.5 mmol/L (ref 3.5–5.1)
Sodium: 136 mmol/L (ref 135–145)
Total Bilirubin: 0.3 mg/dL (ref 0.3–1.2)
Total Protein: 7.2 g/dL (ref 6.5–8.1)

## 2021-12-25 LAB — LACTIC ACID, PLASMA: Lactic Acid, Venous: 1.1 mmol/L (ref 0.5–1.9)

## 2021-12-25 LAB — D-DIMER, QUANTITATIVE: D-Dimer, Quant: 0.88 ug/mL-FEU — ABNORMAL HIGH (ref 0.00–0.50)

## 2021-12-25 LAB — TROPONIN I (HIGH SENSITIVITY)
Troponin I (High Sensitivity): 3 ng/L (ref <18)
Troponin I (High Sensitivity): 3 ng/L (ref <18)

## 2021-12-25 LAB — PROCALCITONIN: Procalcitonin: <0.1 ng/mL

## 2021-12-25 IMAGING — CT CT ANGIO CHEST
2 of 6 series · 19 of 46 positions shown · IV contrast (APPLIED)
Comparison: Chest radiograph the [DATE].

CLINICAL DATA: Concern for pulmonary embolism.

EXAM:
CT ANGIOGRAPHY CHEST WITH CONTRAST
TECHNIQUE: Multidetector CT imaging of the chest was performed using the
standard protocol during bolus administration of intravenous
contrast. Multiplanar CT image reconstructions and MIPs were
obtained to evaluate the vascular anatomy.

[Series 8: thins · axial · 0.59mm/px · z∈[+1218,+1465]mm · 16 of 386 slices shown]
[im 17/386  lung]
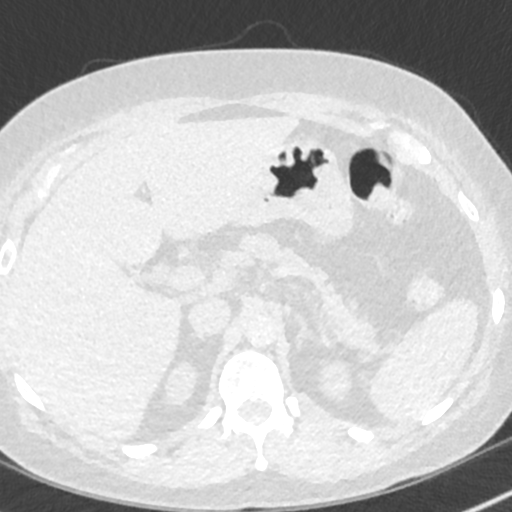
[im 51/386  soft-tissue]
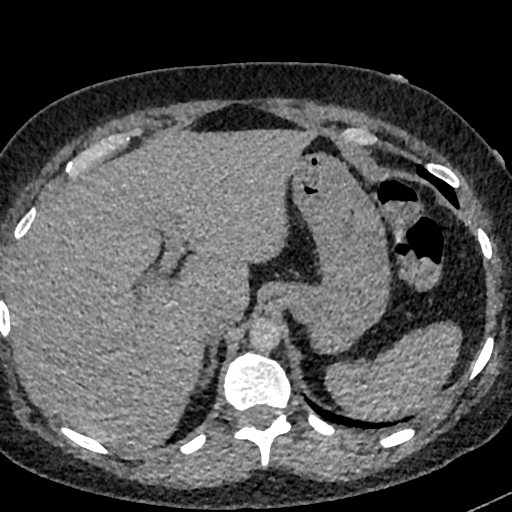
[im 67/386  lung]
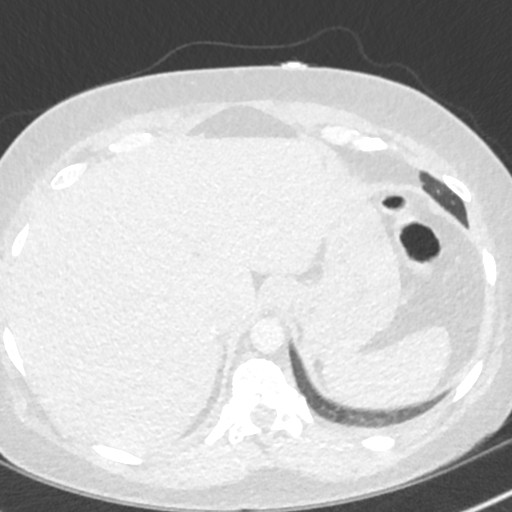
[im 84/386  soft-tissue]
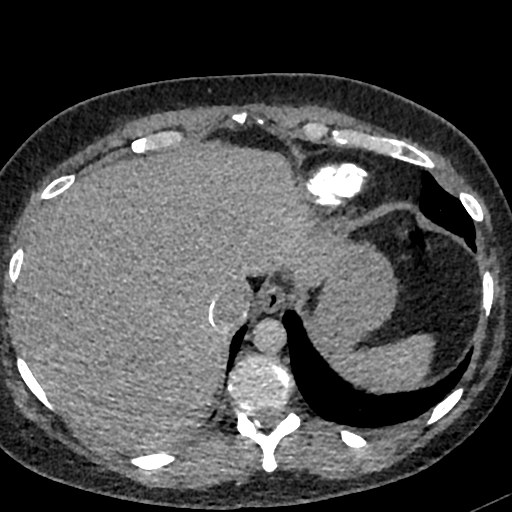
[im 118/386  lung]
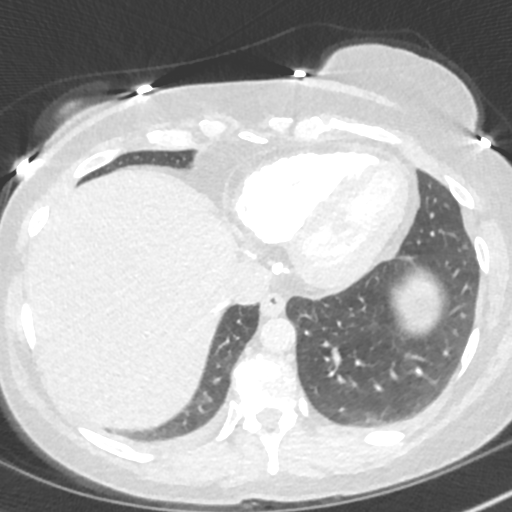
[im 134/386  soft-tissue]
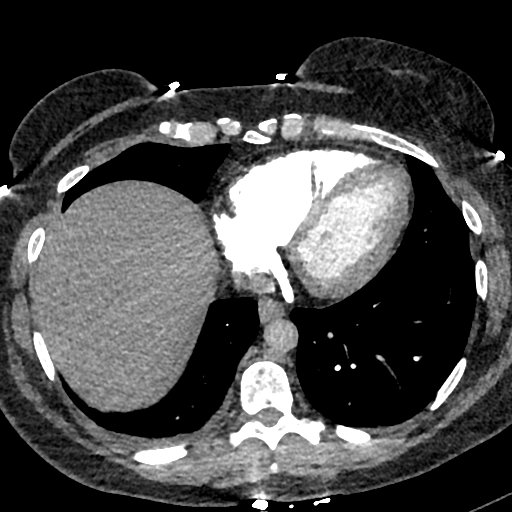
[im 151/386  lung]
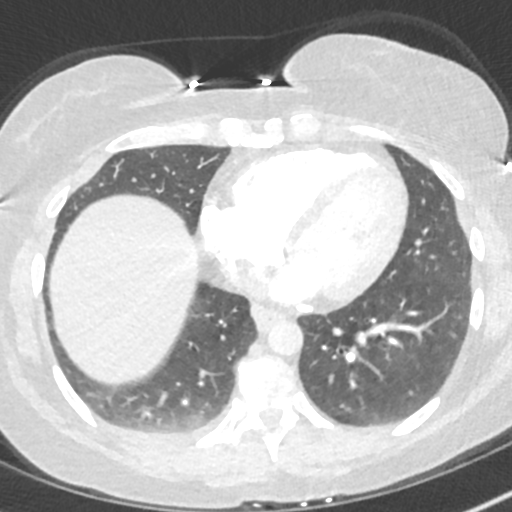
[im 185/386  soft-tissue]
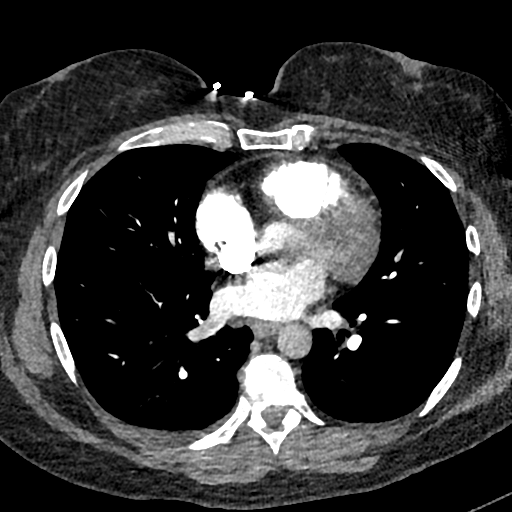
[im 201/386  lung]
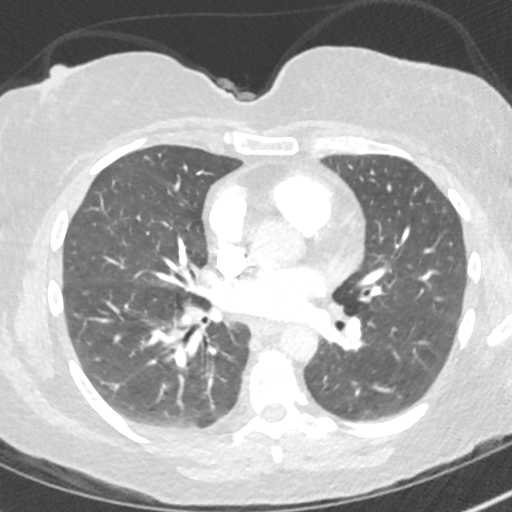
[im 235/386  soft-tissue]
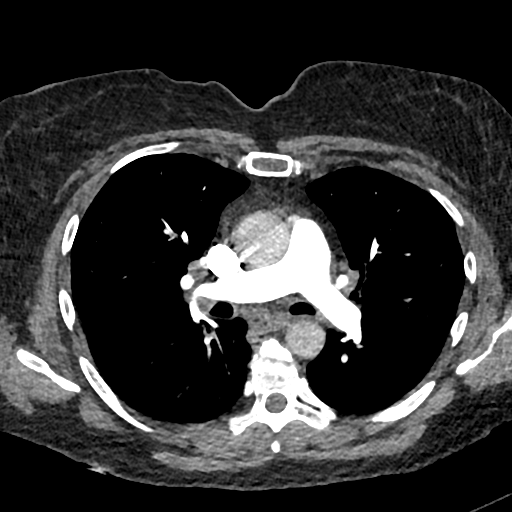
[im 252/386  lung]
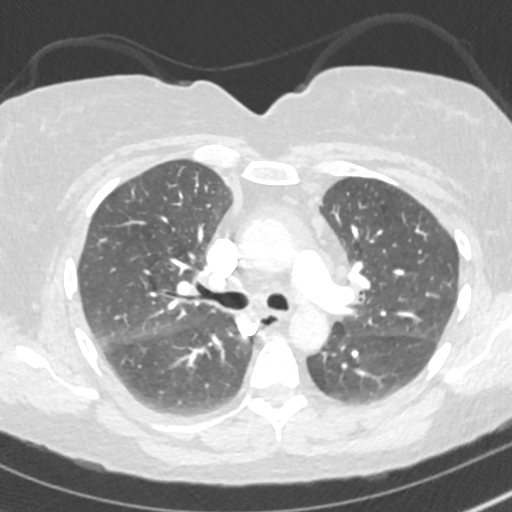
[im 268/386  soft-tissue]
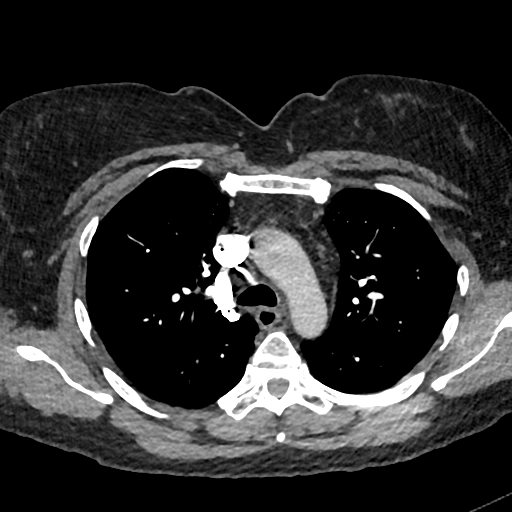
[im 302/386  lung]
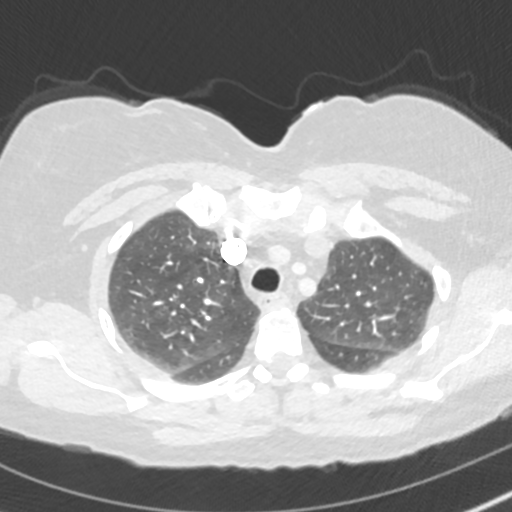
[im 319/386  soft-tissue]
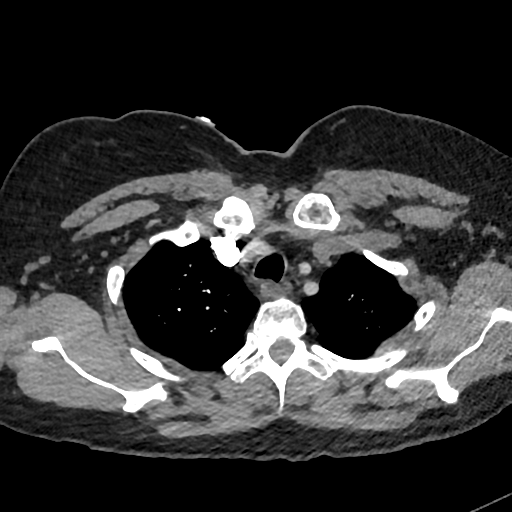
[im 335/386  lung]
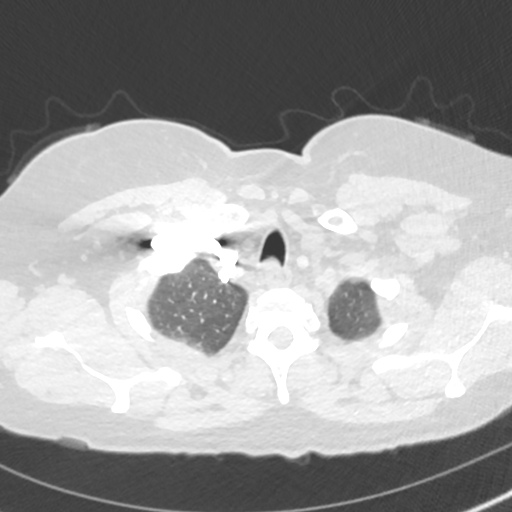
[im 369/386  soft-tissue]
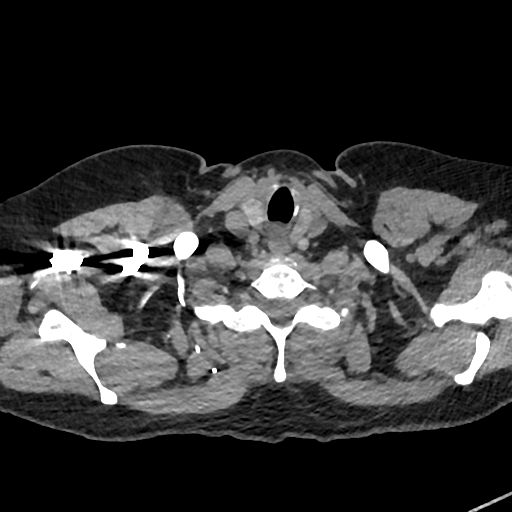

[Series 9: cor · coronal · 0.58mm/px · 3 of 123 slices shown]
[im 31/123  soft-tissue]
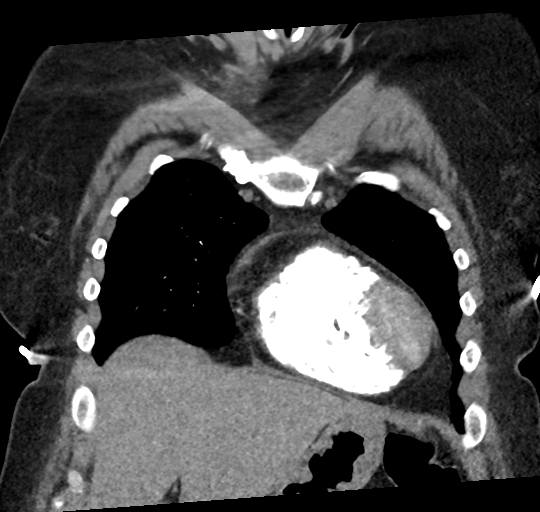
[im 62/123  soft-tissue]
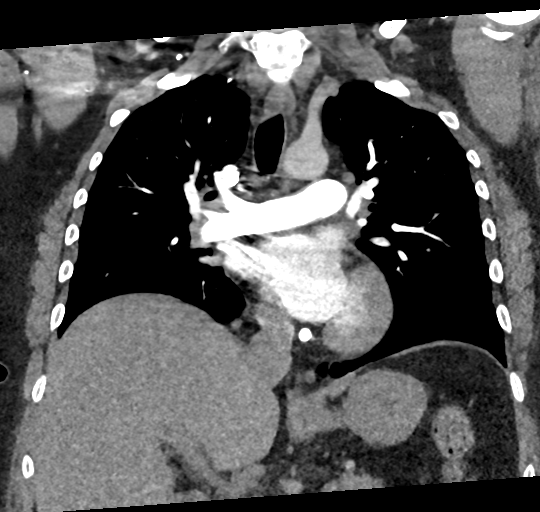
[im 92/123  soft-tissue]
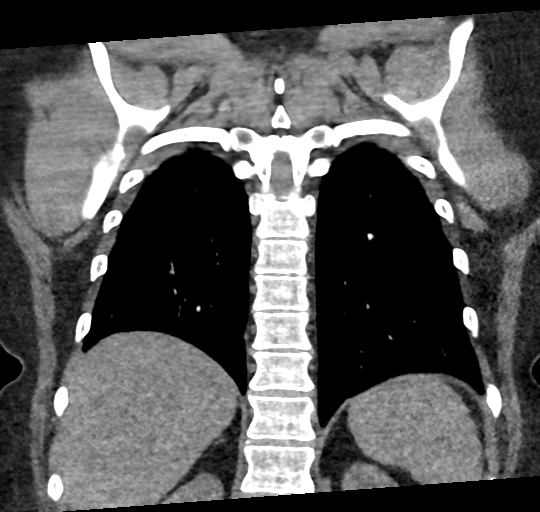

[19 of 46 positions shown; findings below may reference images not displayed]

RADIATION DOSE REDUCTION: This exam was performed according to the
departmental dose-optimization program which includes automated
exposure control, adjustment of the mA and/or kV according to
patient size and/or use of iterative reconstruction technique.

CONTRAST:  75mL OMNIPAQUE IOHEXOL 350 MG/ML SOLN
FINDINGS: Cardiovascular: There is no cardiomegaly or pericardial effusion.
The thoracic aorta is unremarkable. No pulmonary artery embolus
identified.

Mediastinum/Nodes: Mildly enlarged bilateral hilar lymph nodes
measure up to 12 mm on the right. Subcarinal lymph node measures 11
mm. The esophagus is grossly unremarkable. No mediastinal fluid
collection.

Lungs/Pleura: Trace right pleural effusion. No focal consolidation
or pneumothorax. The central airways are patent.

Upper Abdomen: Cholecystectomy.

Musculoskeletal: No chest wall abnormality. No acute or significant
osseous findings.

Review of the MIP images confirms the above findings.
IMPRESSION: 1. No CT evidence of pulmonary artery embolus.
2. Trace right pleural effusion.
3. Mildly enlarged bilateral hilar lymph nodes, nonspecific.

## 2021-12-25 IMAGING — DX DG CHEST 2V
2 series · 2 of 2 positions shown · non-contrast
Comparison: None.

CLINICAL DATA: Cough for 2 weeks

EXAM:
CHEST - 2 VIEW

[chest pa]
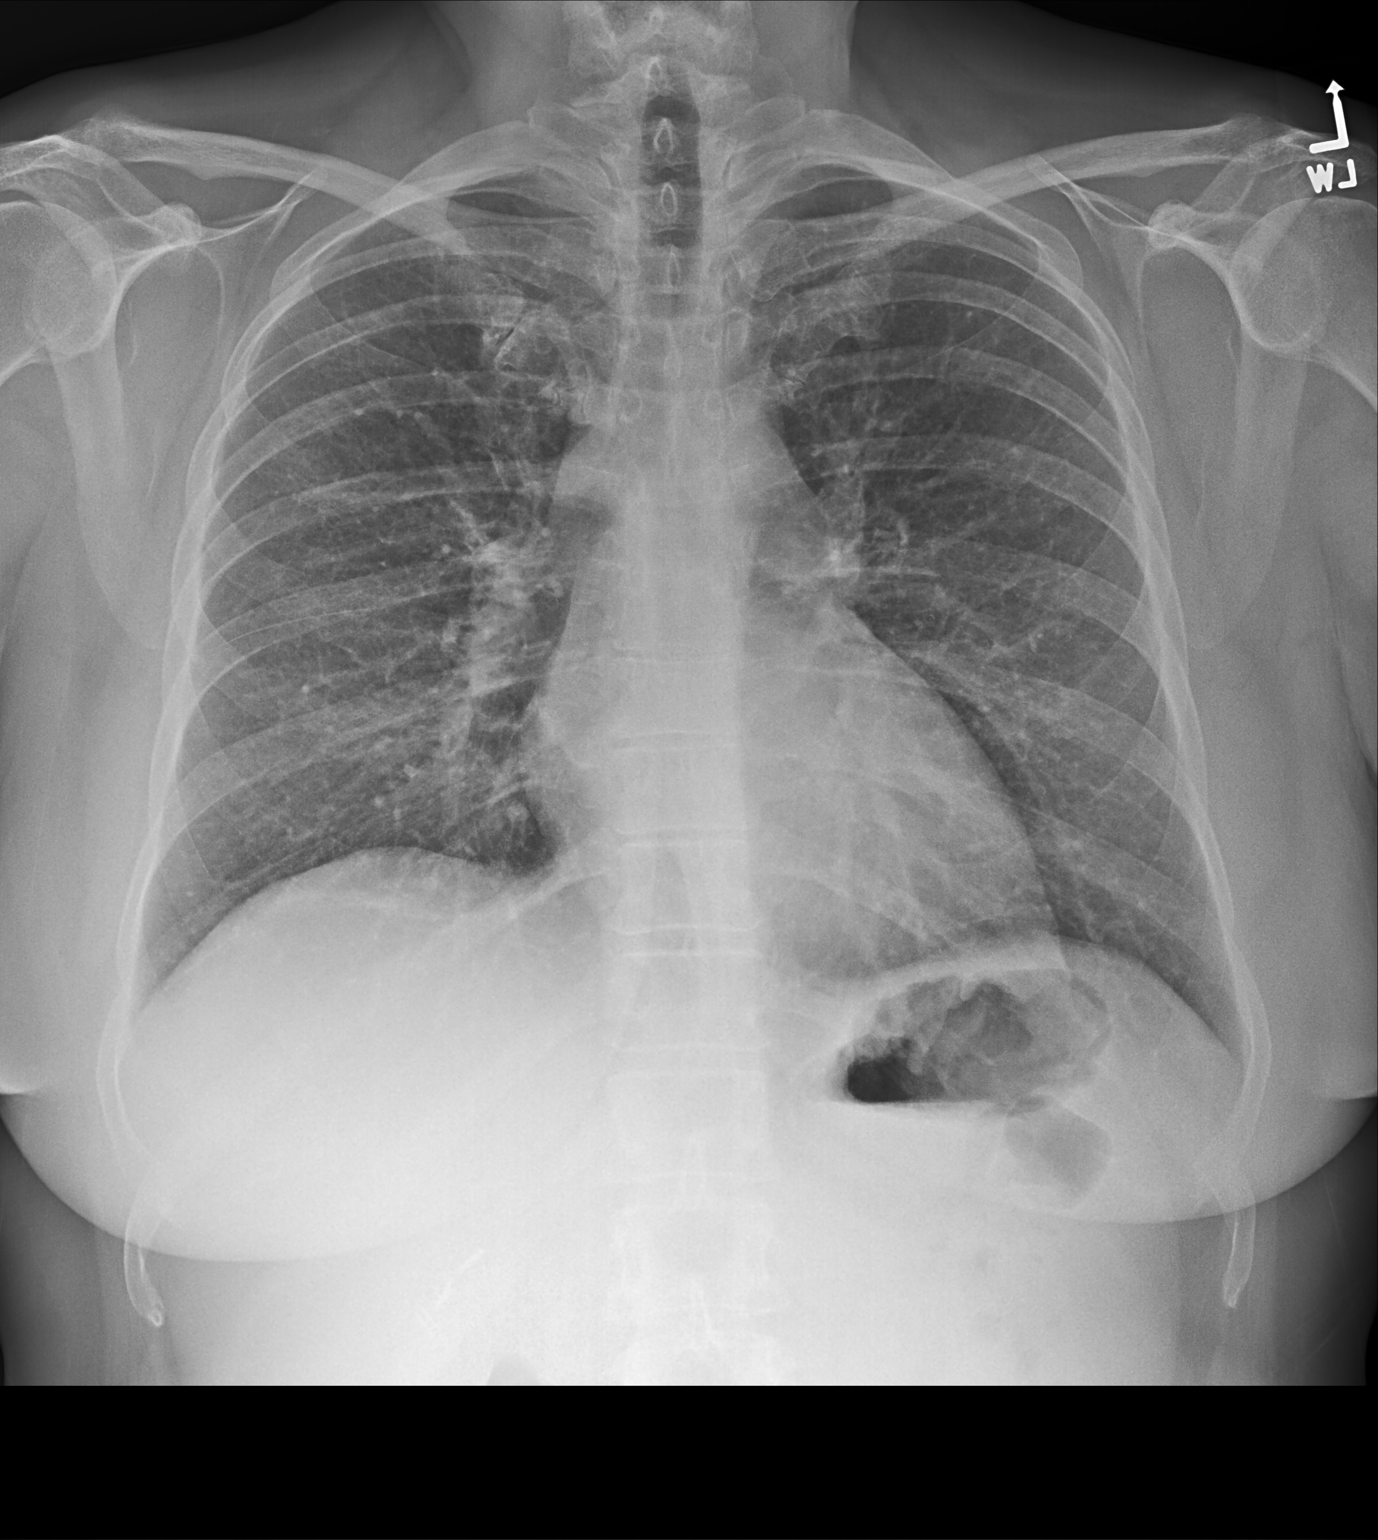

[chest lat]
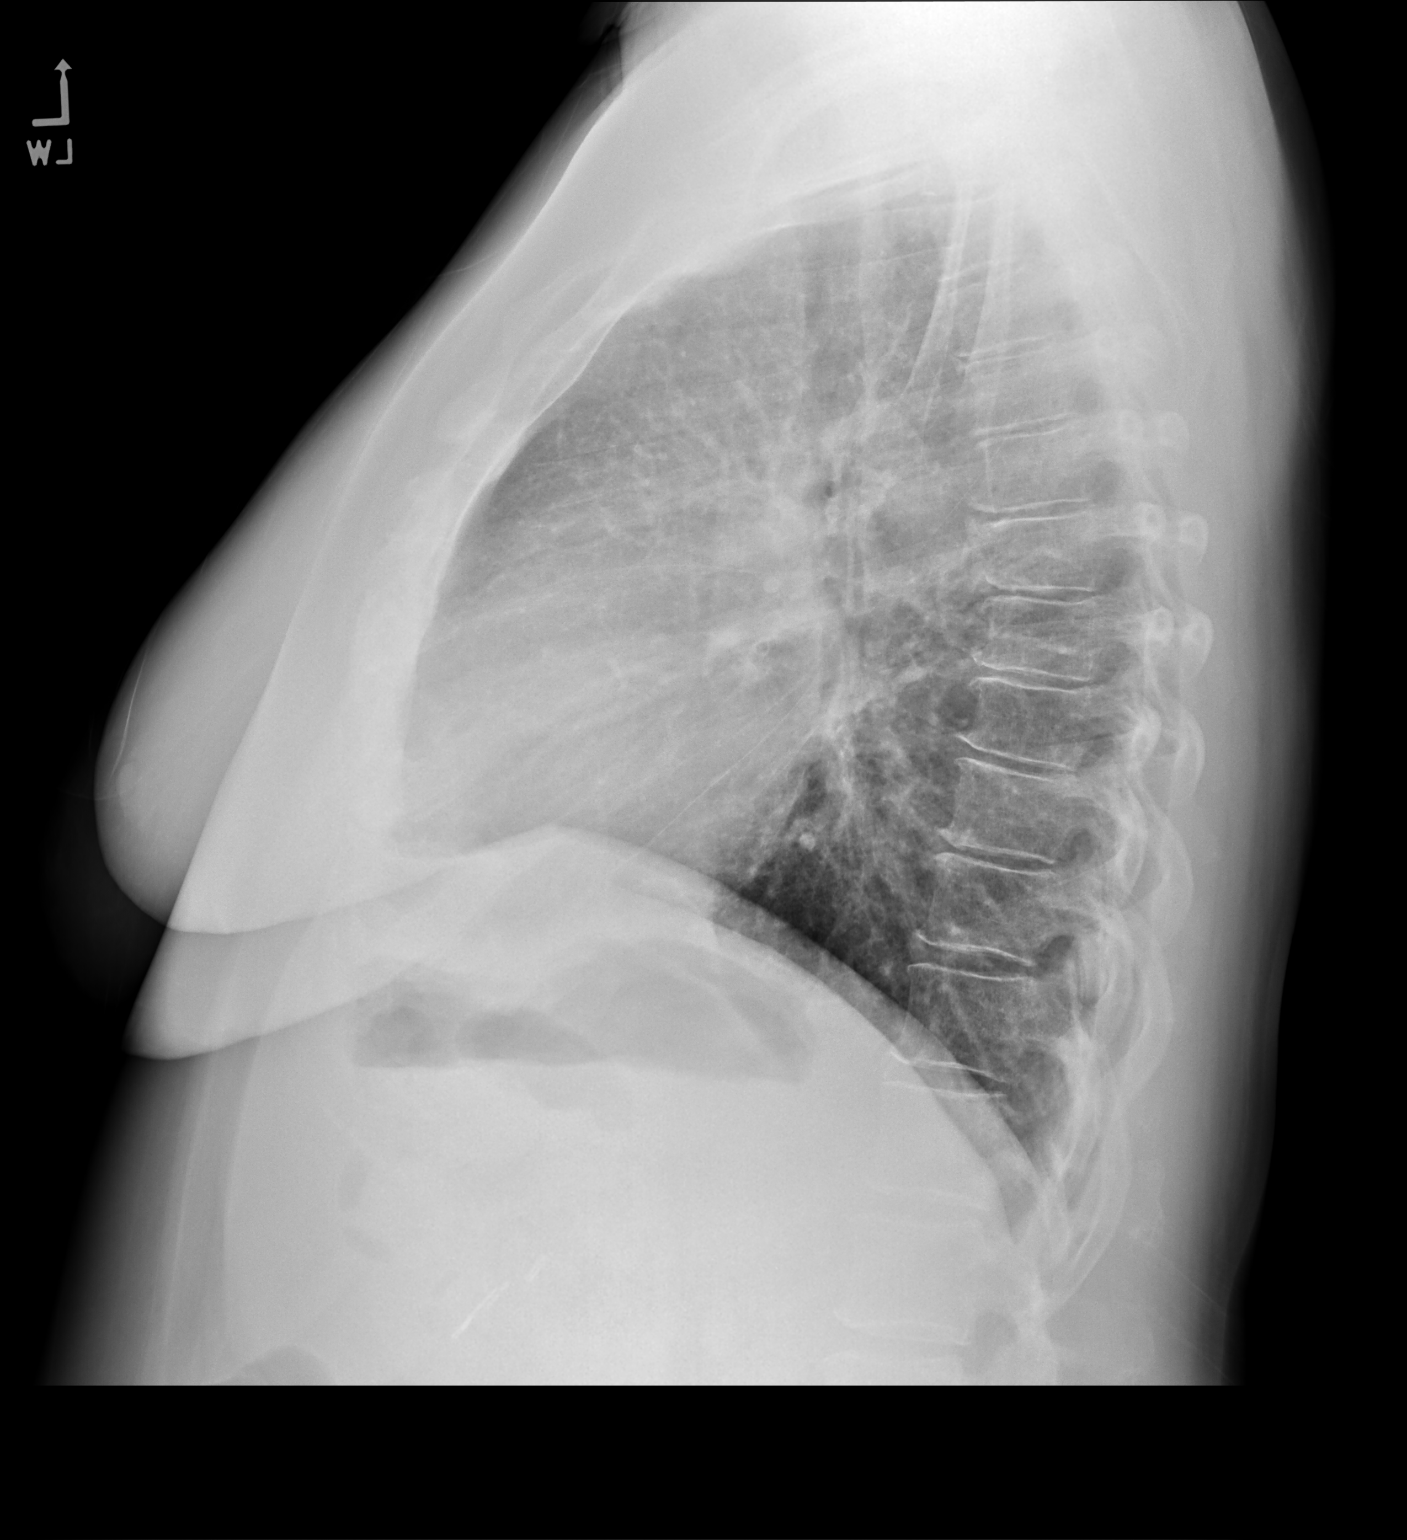

[2 of 2 positions shown; findings below may reference images not displayed]

FINDINGS: The heart size and mediastinal contours are within normal limits.
Both lungs are clear. The visualized skeletal structures are
unremarkable.
IMPRESSION: No active cardiopulmonary disease.

## 2021-12-25 MED ORDER — CYCLOBENZAPRINE HCL 10 MG PO TABS
10.0000 mg | ORAL_TABLET | Freq: Three times a day (TID) | ORAL | 0 refills | Status: DC | PRN
  Filled 2021-12-25: qty 30, 10d supply, fill #0

## 2021-12-25 MED ORDER — TRAMADOL HCL 50 MG PO TABS
50.0000 mg | ORAL_TABLET | Freq: Every day | ORAL | 0 refills | Status: AC | PRN
  Filled 2021-12-25: qty 60, 30d supply, fill #0

## 2021-12-25 MED ORDER — SODIUM CHLORIDE 0.9 % IV BOLUS
1000.0000 mL | Freq: Once | INTRAVENOUS | Status: AC
  Administered 2021-12-25: 1000 mL via INTRAVENOUS

## 2021-12-25 MED ORDER — GUAIFENESIN 100 MG/5ML PO LIQD
5.0000 mL | Freq: Once | ORAL | Status: AC
  Administered 2021-12-25: 5 mL via ORAL
  Filled 2021-12-25: qty 10

## 2021-12-25 MED ORDER — PREDNISONE 10 MG PO TABS
ORAL_TABLET | ORAL | 0 refills | Status: DC
  Filled 2021-12-25: qty 21, 6d supply, fill #0

## 2021-12-25 MED ORDER — GUAIFENESIN-CODEINE 100-10 MG/5ML PO SYRP
5.0000 mL | ORAL_SOLUTION | Freq: Every evening | ORAL | 0 refills | Status: DC | PRN
  Filled 2021-12-25: qty 118, 23d supply, fill #0

## 2021-12-25 MED ORDER — ALPRAZOLAM 0.25 MG PO TABS
0.2500 mg | ORAL_TABLET | Freq: Two times a day (BID) | ORAL | 0 refills | Status: DC | PRN
  Filled 2021-12-25: qty 30, 8d supply, fill #0

## 2021-12-25 MED ORDER — FLUCONAZOLE 150 MG PO TABS
150.0000 mg | ORAL_TABLET | Freq: Every day | ORAL | 0 refills | Status: AC
  Filled 2021-12-25: qty 1, 1d supply, fill #0

## 2021-12-25 MED ORDER — KETOROLAC TROMETHAMINE 15 MG/ML IJ SOLN
15.0000 mg | Freq: Once | INTRAMUSCULAR | Status: AC
  Administered 2021-12-25: 15 mg via INTRAVENOUS
  Filled 2021-12-25: qty 1

## 2021-12-25 MED ORDER — AMOXICILLIN-POT CLAVULANATE 875-125 MG PO TABS
1.0000 | ORAL_TABLET | Freq: Two times a day (BID) | ORAL | 0 refills | Status: DC
  Filled 2021-12-25: qty 20, 10d supply, fill #0

## 2021-12-25 MED ORDER — IOHEXOL 350 MG/ML SOLN
75.0000 mL | Freq: Once | INTRAVENOUS | Status: AC | PRN
  Administered 2021-12-25: 75 mL via INTRAVENOUS
  Filled 2021-12-25: qty 75

## 2021-12-25 MED ORDER — AZITHROMYCIN 250 MG PO TABS
ORAL_TABLET | ORAL | 0 refills | Status: AC
  Filled 2021-12-25: qty 6, 5d supply, fill #0

## 2021-12-25 NOTE — Patient Instructions (Signed)
Pharyngitis °Pharyngitis is inflammation of the throat (pharynx). It is a very common cause of sore throat. Pharyngitis can be caused by a bacteria, but it is usually caused by a virus. Most cases of pharyngitis get better on their own without treatment. °What are the causes? °This condition may be caused by: °Infection by viruses (viral). Viral pharyngitis spreads easily from person to person (is contagious) through coughing, sneezing, and sharing of personal items or utensils such as cups, forks, spoons, and toothbrushes. °Infection by bacteria (bacterial). Bacterial pharyngitis may be spread by touching the nose or face after coming in contact with the bacteria, or through close contact, such as kissing. °Allergies. Allergies can cause buildup of mucus in the throat (post-nasal drip), leading to inflammation and irritation. Allergies can also cause blocked nasal passages, forcing breathing through the mouth, which dries and irritates the throat. °What increases the risk? °You are more likely to develop this condition if: °You are 5-24 years old. °You are exposed to crowded environments such as daycare, school, or dormitory living. °You live in a cold climate. °You have a weakened disease-fighting (immune) system. °What are the signs or symptoms? °Symptoms of this condition vary by the cause. Common symptoms of this condition include: °Sore throat. °Fatigue. °Low-grade fever. °Stuffy nose (nasal congestion) and cough. °Headache. °Other symptoms may include: °Glands in the neck (lymph nodes) that are swollen. °Skin rashes. °Plaque-like film on the throat or tonsils. This is often a symptom of bacterial pharyngitis. °Vomiting. °Red, itchy eyes (conjunctivitis). °Loss of appetite. °Joint pain and muscle aches. °Enlarged tonsils. °How is this diagnosed? °This condition may be diagnosed based on your medical history and a physical exam. Your health care provider will ask you questions about your illness and your  symptoms. °A swab of your throat may be done to check for bacteria (rapid strep test). Other lab tests may also be done, depending on the suspected cause, but these are rare. °How is this treated? °Many times, treatment is not needed for this condition. Pharyngitis usually gets better in 3-4 days without treatment. °Bacterial pharyngitis may be treated with antibiotic medicines. °Follow these instructions at home: °Medicines °Take over-the-counter and prescription medicines only as told by your health care provider. °If you were prescribed an antibiotic medicine, take it as told by your health care provider. Do not stop taking the antibiotic even if you start to feel better. °Use throat sprays to soothe your throat as told by your health care provider. °Children can get pharyngitis. Do not give your child aspirin because of the association with Reye's syndrome. °Managing pain °To help with pain, try: °Sipping warm liquids, such as broth, herbal tea, or warm water. °Eating or drinking cold or frozen liquids, such as frozen ice pops. °Gargling with a mixture of salt and water 3-4 times a day or as needed. To make salt water, completely dissolve ½-1 tsp (3-6 g) of salt in 1 cup (237 mL) of warm water. °Sucking on hard candy or throat lozenges. °Putting a cool-mist humidifier in your bedroom at night to moisten the air. °Sitting in the bathroom with the door closed for 5-10 minutes while you run hot water in the shower. ° °General instructions ° °Do not use any products that contain nicotine or tobacco. These products include cigarettes, chewing tobacco, and vaping devices, such as e-cigarettes. If you need help quitting, ask your health care provider. °Rest as told by your health care provider. °Drink enough fluid to keep your urine pale yellow. °How   is this prevented? To help prevent becoming infected or spreading infection: Wash your hands often with soap and water for at least 20 seconds. If soap and water are not  available, use hand sanitizer. Do not touch your eyes, nose, or mouth with unwashed hands, and wash hands after touching these areas. Do not share cups or eating utensils. Avoid close contact with people who are sick. Contact a health care provider if: You have large, tender lumps in your neck. You have a rash. You cough up green, yellow-brown, or bloody mucus. Get help right away if: Your neck becomes stiff. You drool or are unable to swallow liquids. You cannot drink or take medicines without vomiting. You have severe pain that does not go away, even after you take medicine. You have trouble breathing, and it is not caused by a stuffy nose. You have new pain and swelling in your joints such as the knees, ankles, wrists, or elbows. These symptoms may represent a serious problem that is an emergency. Do not wait to see if the symptoms will go away. Get medical help right away. Call your local emergency services (911 in the [place] Do not drive yourself to the hospital. Summary Pharyngitis is redness, pain, and swelling (inflammation) of the throat (pharynx). While pharyngitis can be caused by a bacteria, the most common causes are viral. Most cases of pharyngitis get better on their own without treatment. Bacterial pharyngitis is treated with antibiotic medicines. This information is not intended to replace advice given to you by your health care provider. Make sure you discuss any questions you have with your health care provider. Document Revised: 01/09/2021 Document Reviewed: 01/09/2021 Elsevier Patient Education  [place]. Sinusitis, Adult Sinusitis is inflammation of your sinuses. Sinuses are hollow spaces in the bones around your face. Your sinuses are located: Around your eyes. In the middle of your forehead. Behind your nose. In your cheekbones. Mucus normally drains out of your sinuses. When your nasal tissues become inflamed or swollen, mucus can become trapped or  blocked. This allows bacteria, viruses, and fungi to grow, which leads to infection. Most infections of the sinuses are caused by a virus. Sinusitis can develop quickly. It can last for up to 4 weeks (acute) or for more than 12 weeks (chronic). Sinusitis often develops after a cold. What are the causes? This condition is caused by anything that creates swelling in the sinuses or stops mucus from draining. This includes: Allergies. Asthma. Infection from bacteria or viruses. Deformities or blockages in your nose or sinuses. Abnormal growths in the nose (nasal polyps). Pollutants, such as chemicals or irritants in the air. Infection from fungi (rare). What increases the risk? You are more likely to develop this condition if you: Have a weak body defense system (immune system). Do a lot of swimming or diving. Overuse nasal sprays. Smoke. What are the signs or symptoms? The main symptoms of this condition are pain and a feeling of pressure around the affected sinuses. Other symptoms include: Stuffy nose or congestion. Thick drainage from your nose. Swelling and warmth over the affected sinuses. Headache. Upper toothache. A cough that may get worse at night. Extra mucus that collects in the throat or the back of the nose (postnasal drip). Decreased sense of smell and taste. Fatigue. A fever. Sore throat. Bad breath. How is this diagnosed? This condition is diagnosed based on: Your symptoms. Your medical history. A physical exam. Tests to find out if your condition is acute or chronic.  This may include: Checking your nose for nasal polyps. Viewing your sinuses using a device that has a light (endoscope). Testing for allergies or bacteria. Imaging tests, such as an MRI or CT scan. In rare cases, a bone biopsy may be done to rule out more serious types of fungal sinus disease. How is this treated? Treatment for sinusitis depends on the cause and whether your condition is chronic or  acute. If caused by a virus, your symptoms should go away on their own within 10 days. You may be given medicines to relieve symptoms. They include: Medicines that shrink swollen nasal passages (topical intranasal decongestants). Medicines that treat allergies (antihistamines). A spray that eases inflammation of the nostrils (topical intranasal corticosteroids). Rinses that help get rid of thick mucus in your nose (nasal saline washes). If caused by bacteria, your health care provider may recommend waiting to see if your symptoms improve. Most bacterial infections will get better without antibiotic medicine. You may be given antibiotics if you have: A severe infection. A weak immune system. If caused by narrow nasal passages or nasal polyps, you may need to have surgery. Follow these instructions at home: Medicines Take, use, or apply over-the-counter and prescription medicines only as told by your health care provider. These may include nasal sprays. If you were prescribed an antibiotic medicine, take it as told by your health care provider. Do not stop taking the antibiotic even if you start to feel better. Hydrate and humidify  Drink enough fluid to keep your urine pale yellow. Staying hydrated will help to thin your mucus. Use a cool mist humidifier to keep the humidity level in your home above 50%. Inhale steam for 10-15 minutes, 3-4 times a day, or as told by your health care provider. You can do this in the bathroom while a hot shower is running. Limit your exposure to cool or dry air. Rest Rest as much as possible. Sleep with your head raised (elevated). Make sure you get enough sleep each night. General instructions  Apply a warm, moist washcloth to your face 3-4 times a day or as told by your health care provider. This will help with discomfort. Wash your hands often with soap and water to reduce your exposure to germs. If soap and water are not available, use hand sanitizer. Do  not smoke. Avoid being around people who are smoking (secondhand smoke). Keep all follow-up visits as told by your health care provider. This is important. Contact a health care provider if: You have a fever. Your symptoms get worse. Your symptoms do not improve within 10 days. Get help right away if: You have a severe headache. You have persistent vomiting. You have severe pain or swelling around your face or eyes. You have vision problems. You develop confusion. Your neck is stiff. You have trouble breathing. Summary Sinusitis is soreness and inflammation of your sinuses. Sinuses are hollow spaces in the bones around your face. This condition is caused by nasal tissues that become inflamed or swollen. The swelling traps or blocks the flow of mucus. This allows bacteria, viruses, and fungi to grow, which leads to infection. If you were prescribed an antibiotic medicine, take it as told by your health care provider. Do not stop taking the antibiotic even if you start to feel better. Keep all follow-up visits as told by your health care provider. This is important. This information is not intended to replace advice given to you by your health care provider. Make sure you discuss  any questions you have with your health care provider. Document Revised: 03/15/2018 Document Reviewed: 03/15/2018 Elsevier Patient Education  2022 [employer].

## 2021-12-25 NOTE — Progress Notes (Signed)
Acute Office Visit  Subjective:    Patient ID: Kathleen Carr, female    DOB: 12-11-1970, 51 y.o.   MRN: [id number]  Chief Complaint  Patient presents with   Follow-up    F/u post Urgent care visit in [place]. Pt with sore throat, wet cough, loss of voice. Tested for flu and covid - NEGATIVE.     HPI Patient is in today for follow up - she was seen on 12/21/21  UC in [place] for cough, had a fever, Onset was last Tuesday 12/17/2021. She had sinus congestion, sore throat.   Started on Clindamycin, Bromfed- which she feels if not helping, she was given a prednisone dose back.  Tested for covid, flu was negative. She did not have a strep test.  Denies any fever now,  Fatigue.  She still has a very congested cough.   Patient  denies any body aches,chills, rash, chest pain, shortness of breath, nausea, vomiting, or diarrhea.  Denies dizziness, lightheadedness, pre syncopal or syncopal episodes.    Past Medical History:  Diagnosis Date   Allergy    Anxiety    Arthritis    Asthma    Bipolar depression ([place])    Depression    Diabetes mellitus without complication (HCC)    GERD (gastroesophageal reflux disease)    Hypertension    PTSD (post-traumatic stress disorder)    PTSD (post-traumatic stress disorder)    Thyroid disease     Past Surgical History:  Procedure Laterality Date   ABDOMINAL HYSTERECTOMY     CHOLECYSTECTOMY     KNEE ARTHROSCOPY Bilateral    TONSILLECTOMY      Family History  Problem Relation Age of Onset   Hypertension Mother    Thyroid disease Mother    Cancer Father 71       carcinoid, right lung   Ulcerative colitis Father    Glaucoma Maternal Grandmother    Renal Disease Maternal Grandfather    Diabetes Maternal Grandfather        Type2   Stroke Paternal Grandfather    Hypertension Paternal Grandfather     Social History   Socioeconomic History   Marital status: Married    Spouse name: Not on file   Number of children: Not on file    Years of education: Not on file   Highest education level: Not on file  Occupational History   Not on file  Tobacco Use   Smoking status: Some Days    Types: Cigarettes    Last attempt to quit: 05/25/2020    Years since quitting: 1.6   Smokeless tobacco: Never  Vaping Use   Vaping Use: Some days  Substance and Sexual Activity   Alcohol use: Not Currently   Drug use: Not Currently   Sexual activity: Not Currently    Partners: Female    Comment: married to wife  Other Topics Concern   Not on file  Social History Narrative   Not on file   Social Determinants of Health   Financial Resource Strain: Not on file  Food Insecurity: Not on file  Transportation Needs: Not on file  Physical Activity: Not on file  Stress: Not on file  Social Connections: Not on file  Intimate Partner Violence: Not on file    Outpatient Medications Prior to Visit  Medication Sig Dispense Refill   cholecalciferol (VITAMIN D3) 25 MCG (1000 UNIT) tablet Take 1,000 Units by mouth daily.     levothyroxine (SYNTHROID) 50 MCG tablet TAKE  1 TABLET BY MOUTH DAILY BEFORE BREAKFAST. 90 tablet 1   loratadine (CLARITIN) 10 MG tablet Take 10 mg by mouth daily.     metFORMIN (GLUCOPHAGE) 500 MG tablet Take 1 tablet (500 mg total) by mouth 2 (two) times daily with a meal. 180 tablet 3   oxybutynin (DITROPAN XL) 10 MG 24 hr tablet Take 1 tablet (10 mg total) by mouth at bedtime. 90 tablet 1   pantoprazole (PROTONIX) 40 MG tablet Take 1 tablet (40 mg total) by mouth daily. 90 tablet 1   QUEtiapine (SEROQUEL) 300 MG tablet TAKE 1 TABLET (300 MG TOTAL) BY MOUTH AT BEDTIME. 90 tablet 1   rosuvastatin (CRESTOR) 5 MG tablet TAKE 1 TABLET BY MOUTH DAILY. 90 tablet 1   sertraline (ZOLOFT) 100 MG tablet TAKE 1 TABLET BY MOUTH DAILY 90 tablet 0   zolpidem (AMBIEN) 5 MG tablet TAKE 1 TABLET (5 MG TOTAL) BY MOUTH AT BEDTIME AS NEEDED FOR SLEEP. 30 tablet 3   albuterol (PROAIR HFA) 108 (90 Base) MCG/ACT inhaler Inhale 1-2 puffs  into the lungs every 6 (six) hours as needed for wheezing. 8.5 g 1   ALPRAZolam (XANAX) 0.25 MG tablet Take 1-2 tablets (0.25-0.5 mg total) by mouth 2 (two) times daily as needed for anxiety (will cause drowsiness.). 30 tablet 0   cyclobenzaprine (FLEXERIL) 10 MG tablet Take 1 tablet (10 mg total) by mouth 3 (three) times daily as needed for muscle spasms (will cause drowsiness.). 30 tablet 0   nystatin (MYCOSTATIN) 100000 UNIT/ML suspension Use as needed up to four times daily 55m in mouth swish hold and spit. 60 mL 0   No facility-administered medications prior to visit.    Allergies  Allergen Reactions   Dust Mite Mixed Allergen Ext [Mite (D. 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 Negative for activity change, appetite change, chills and diaphoresis.  HENT:  Positive for congestion, postnasal drip, rhinorrhea, sinus pressure and sore throat. Negative for dental problem, drooling, ear discharge, ear pain, facial swelling, hearing loss, mouth sores, nosebleeds, sinus pain, sneezing, tinnitus, trouble swallowing and voice change.   Respiratory:  Positive for cough. Negative for apnea, choking, chest tightness, shortness of breath, wheezing and stridor.   Cardiovascular: Negative.   Gastrointestinal: Negative.   Genitourinary: Negative.   Musculoskeletal: Negative.   Skin: Negative.  Negative for rash and wound.  Neurological: Negative.   Hematological: Negative.   Psychiatric/Behavioral: Negative.        Objective:    Physical Exam Vitals reviewed.  Constitutional:      General: She is not in acute distress.    Appearance: Normal appearance. She is not ill-appearing, toxic-appearing or diaphoretic.  HENT:     Head: Normocephalic and atraumatic.     Right Ear: External  ear normal.     Left Ear: External ear normal.     Nose: Congestion and rhinorrhea present.     Mouth/Throat:     Mouth: Mucous membranes are moist.     Pharynx: Posterior oropharyngeal erythema present.  Eyes:     General: No scleral icterus.       Right eye: No discharge.        Left eye: No discharge.     Conjunctiva/sclera: Conjunctivae normal.  Cardiovascular:  Rate and Rhythm: Normal rate and regular rhythm.     Pulses: Normal pulses.     Heart sounds: Normal heart sounds. No murmur heard.   No friction rub. No gallop.  Pulmonary:     Effort: Pulmonary effort is normal. No respiratory distress.     Breath sounds: No stridor. Rhonchi present. No wheezing or rales.  Chest:     Chest wall: No tenderness.  Abdominal:     General: There is no distension.     Palpations: Abdomen is soft. There is no mass.     Tenderness: There is no abdominal tenderness. There is no right CVA tenderness, left CVA tenderness, guarding or rebound.     Hernia: No hernia is present.  Neurological:     Mental Status: She is oriented to person, place, and time.  Psychiatric:        Mood and Affect: Mood normal.        Behavior: Behavior normal.        Thought Content: Thought content normal.        Judgment: Judgment normal.    BP 108/70 (BP Location: Right Arm, Patient Position: Sitting, Cuff Size: Small)    Pulse 80    Temp 98.2 F (36.8 C) (Oral)    Ht 5' 4"  (1.626 m)    Wt 180 lb (81.6 kg)    SpO2 97%    BMI 30.90 kg/m  Wt Readings from Last 3 Encounters:  12/31/21 176 lb (79.8 kg)  12/25/21 176 lb (79.8 kg)  12/25/21 180 lb (81.6 kg)    Health Maintenance Due  Topic Date Due   FOOT EXAM  Never done   OPHTHALMOLOGY EXAM  Never done   PAP SMEAR-Modifier  Never done   MAMMOGRAM  Never done    There are no preventive care reminders to display for this patient.   Lab Results  Component Value Date   TSH 2.11 11/27/2021   Lab Results  Component Value Date   WBC 21.5 Repeated  and verified X2. (HH) 12/31/2021   HGB 13.8 12/31/2021   HCT 40.8 12/31/2021   MCV 93.3 12/31/2021   PLT 405.0 (H) 12/31/2021   Lab Results  Component Value Date   NA 136 12/31/2021   K 4.5 12/31/2021   CO2 35 (H) 12/31/2021   GLUCOSE 100 (H) 12/31/2021   BUN 18 12/31/2021   CREATININE 0.76 12/31/2021   BILITOT 0.3 12/31/2021   ALKPHOS 74 12/31/2021   AST 32 12/31/2021   ALT 60 (H) 12/31/2021   PROT 6.8 12/31/2021   ALBUMIN 4.4 12/31/2021   CALCIUM 9.6 12/31/2021   ANIONGAP 7 12/25/2021   EGFR 83 01/14/2021   GFR 91.46 12/31/2021   Lab Results  Component Value Date   CHOL 187 08/16/2021   Lab Results  Component Value Date   HDL 56.10 08/16/2021   Lab Results  Component Value Date   LDLCALC 106 (H) 08/02/2020   Lab Results  Component Value Date   TRIG 214.0 (H) 08/16/2021   Lab Results  Component Value Date   CHOLHDL 3 08/16/2021   Lab Results  Component Value Date   HGBA1C 6.6 (H) 11/27/2021       Assessment & Plan:   Problem List Items Addressed This Visit       Respiratory   Bacterial upper respiratory infection - Primary   Relevant Medications   amoxicillin-clavulanate (AUGMENTIN) 875-125 MG tablet   Other Relevant Orders   DG Chest 2 View (Completed)  CBC with Differential/Platelet (Completed)     Nervous and Auditory   Back pain of lumbar region with sciatica   Relevant Medications   predniSONE (DELTASONE) 10 MG tablet   ALPRAZolam (XANAX) 0.25 MG tablet   traMADol (ULTRAM) 50 MG tablet   cyclobenzaprine (FLEXERIL) 10 MG tablet     Other   Sore throat   Relevant Orders   Mononucleosis Test, Qual W/ Reflex (Completed)   Culture, Group A Strep (Completed)   Acute cough   Relevant Medications   guaiFENesin-codeine (ROBITUSSIN AC) 100-10 MG/5ML syrup   predniSONE (DELTASONE) 10 MG tablet   Other Relevant Orders   DG Chest 2 View (Completed)   CBC with Differential/Platelet (Completed)     Meds ordered this encounter   Medications   amoxicillin-clavulanate (AUGMENTIN) 875-125 MG tablet    Sig: Take 1 tablet by mouth 2 (two) times daily.    Dispense:  20 tablet    Refill:  0   guaiFENesin-codeine (ROBITUSSIN AC) 100-10 MG/5ML syrup    Sig: Take 5 mLs by mouth at bedtime as needed for cough.    Dispense:  118 mL    Refill:  0   predniSONE (DELTASONE) 10 MG tablet    Sig: Take 6 tabs on day 1, 5 tabs on day 2, 4 tabs on day 3, 3 tabs on day 4, 2 tabs on day 5, 1 tab on day 6. Then stop.    Dispense:  21 tablet    Refill:  0   ALPRAZolam (XANAX) 0.25 MG tablet    Sig: Take 1-2 tablets (0.25-0.5 mg total) by mouth 2 (two) times daily as needed for anxiety (will cause drowsiness.).    Dispense:  30 tablet    Refill:  0   traMADol (ULTRAM) 50 MG tablet    Sig: Take 1-2 tablets (50-100 mg total) by mouth daily as needed (not to use with any other narcotic or cough syrup/ sedative.).    Dispense:  60 tablet    Refill:  0   cyclobenzaprine (FLEXERIL) 10 MG tablet    Sig: Take 1 tablet (10 mg total) by mouth 3 (three) times daily as needed for muscle spasms (will cause drowsiness.).    Dispense:  30 tablet    Refill:  0  Requested refill on medications as above as well.  Discontinue the phenergan cough syrup.  Will start Augmentin for bacterial infection, she has been on clindamycin and does not seem to be improving much. Chest x ray today to rule out pneumonia.  CBC and CMP.    Red Flags discussed. The patient was given clear instructions to go to ER or return to medical center if any red flags develop, symptoms do not improve, worsen or new problems develop. They verbalized understanding.   Return in 3 days (on 12/28/2021), or if symptoms worsen or fail to improve, for at any time for any worsening symptoms, Go to Emergency room/ urgent care if worse.   Marcille Buffy, FNP

## 2021-12-25 NOTE — ED Triage Notes (Signed)
Pt sent from PCP for elevated WBC 22 , states she has been sick with cough congestion for the past week was seen at an urgent care in [place] and started on clindamycin and prednisone wasn't doing any better so she went to her PCP today. Pt is in NAD . Flu and covid negative ?

## 2021-12-25 NOTE — Progress Notes (Signed)
Kathleen Carr please have patient go to hospital [facility] ED of choice - or  [facility] or [facility] ok, her WBC is extremely elevated, afraid she could be septic needs further inpatient work up and IV antibiotics.

## 2021-12-25 NOTE — Discharge Instructions (Addendum)
I suspect this is most likely viral in nature but given how long her symptoms of going on we can try an antibiotic course to help prevent this from getting any worse.  Return to the ER if develop worsening shortness of breath or any other concerns. ? ? ?IMPRESSION:  ?1. No CT evidence of pulmonary artery embolus.  ?2. Trace right pleural effusion.  ?3. Mildly enlarged bilateral hilar lymph nodes, nonspecific.  ? ?

## 2021-12-25 NOTE — ED Provider Notes (Signed)
? ?[facility] ?Provider Note ? ? ? Event Date/Time  ? First MD Initiated Contact with Patient 12/25/21 1549   ?  (approximate) ? ? ?History  ? ?Abnormal Lab ? ? ?HPI ? ?Kathleen Carr is a 51 y.o. female  with diabetes with history of Sepsis from [facility] who comes in with  cough and congestion.  Pt reports symptoms started on Weds, one week ago.  They have driven down to [place].  Thought it was allergies. Then on Saturday she was negative for flu and covid.  She went home on prednisone and clinda.  Saw PCP today because symptoms were worsening and they had started her on augmentin.  She reports sob, feeling tired, coughing, and chills and just overall unwell.  ? ? ?Physical Exam  ? ?Triage Vital Signs: ?ED Triage Vitals  ?Enc Vitals Group  ?   BP 12/25/21 1514 123/72  ?   Pulse Rate 12/25/21 1514 66  ?   Resp 12/25/21 1514 18  ?   Temp 12/25/21 1514 97.8 ?F (36.6 ?C)  ?   Temp Source 12/25/21 1514 Oral  ?   SpO2 12/25/21 1514 97 %  ?   Weight 12/25/21 1515 176 lb (79.8 kg)  ?   Height 12/25/21 1515 _0  (1.626 m)  ?   Head Circumference --   ?   Peak Flow --   ?   Pain Score 12/25/21 1515 5  ?   Pain Loc --   ?   Pain Edu? --   ?   Excl. in [place]? --   ? ? ?Most recent vital signs: ?Vitals:  ? 12/25/21 1514  ?BP: 123/72  ?Pulse: 66  ?Resp: 18  ?Temp: 97.8 ?F (36.6 ?C)  ?SpO2: 97%  ? ? ? ?General: Awake, no distress.  ?CV:  Good peripheral perfusion.  ?Resp:  Normal effort.  ?Abd:  No distention.  ?Other:  Oropharynx is clear with full range of motion of neck.  Patient is frequently coughing without significant work of breathing. ? ? ?ED Results / Procedures / Treatments  ? ?Labs ?(all labs ordered are listed, but only abnormal results are displayed) ?Labs Reviewed  ?CULTURE, BLOOD (ROUTINE X 2)  ?CULTURE, BLOOD (ROUTINE X 2)  ?RESP PANEL BY RT-PCR (FLU A&B, COVID) [id number]  ?CBC WITH DIFFERENTIAL/PLATELET  ?COMPREHENSIVE METABOLIC PANEL  ?PROCALCITONIN  ?URINALYSIS, ROUTINE W REFLEX MICROSCOPIC  ?LACTIC  ACID, PLASMA  ?LACTIC ACID, PLASMA  ?D-DIMER, QUANTITATIVE  ?TROPONIN I (HIGH SENSITIVITY)  ? ? ? ?EKG ? ?My interpretation of EKG: ? ?Sinus bradycardia rate of 56 without any ST elevation or T wave inversions, normal intervals ? ?RADIOLOGY ?I have reviewed the xray personally from outpatient radiology do not see any evidence of obvious pneumonia ? ?Ct reviewed no PE- pending read  ? ?PROCEDURES: ? ?Critical Care performed: No ? ?Procedures ? ? ?MEDICATIONS ORDERED IN ED: ?Medications  ?sodium chloride 0.9 % bolus 1,000 mL (0 mLs Intravenous Stopped 12/25/21 1833)  ?guaiFENesin (ROBITUSSIN) 100 MG/5ML liquid 5 mL (5 mLs Oral Given 12/25/21 1704)  ?iohexol (OMNIPAQUE) 350 MG/ML injection 75 mL (75 mLs Intravenous Contrast Given 12/25/21 1739)  ?ketorolac (TORADOL) 15 MG/ML injection 15 mg (15 mg Intravenous Given 12/25/21 1748)  ? ? ? ?IMPRESSION / MDM / ASSESSMENT AND PLAN / ED COURSE  ?I reviewed the triage vital signs and the nursing notes. ?             ?               ? ?  Differential diagnosis includes, but is not limited to, suspect this is most likely pneumonia with elevated white count secondary to it as well as secondary to steroid use however given continued worsening symptoms on antibiotics will get CT scan to further evaluate to make sure no evidence of any abscess or other complicating feature.  Given the recent travel we will get D-dimer to see if any evidence of pulmonary embolism and repeat COVID, flu.  We will get some other labs to evaluate for dehydration.  Soft and nontender I doubt abdominal infection.  She denies any other skin sources for infection. ? ?Cardiac markers are negative x2.  Elevated white count 18.  CMP is reassuring.  Procalcitonin is completely negative.  UA negative.  Lactate normal, D-dimer was elevated therefore patient underwent CT imaging, COVID, flu are negative ? ?IMPRESSION: ?1. No CT evidence of pulmonary artery embolus. ?2. Trace right pleural effusion. ?3. Mildly enlarged  bilateral hilar lymph nodes, nonspecific. ?  ? ?Discussed with patient her abnormal CT scan although I suspect this is most likely viral given negative procalcitonin given she has been having symptoms for so long and her primary doctor already prescribed her Augmentin I will also add on some azithromycin to cover atypicals but I explained to patient that I suspect this is more likely viral in nature given there are tons of viruses have been making people feel very sick.  She expressed understanding and will continue the management at home.  At this time I really doubt that she has bacteremia given she never met sepsis criteria other than the elevated white count and her procalcitonin is negative and she feels comfortable going home but understands that if blood cultures are positive they will call her to return to the ER ? ? ?The patient is on the cardiac monitor to evaluate for evidence of arrhythmia and/or significant heart rate changes. ? ? ?FINAL CLINICAL IMPRESSION(S) / ED DIAGNOSES  ? ?Final diagnoses:  ?Leukocytosis, unspecified type  ?Bronchitis  ? ? ? ?Rx / DC Orders  ? ?ED Discharge Orders   ? ?      Ordered  ?  azithromycin (ZITHROMAX Z-PAK) 250 MG tablet       ? 12/25/21 1855  ?  fluconazole (DIFLUCAN) 150 MG tablet  Daily       ? 12/25/21 1855  ? ?  ?  ? ?  ? ? ? ?Note:  This document was prepared using Dragon voice recognition software and may include unintentional dictation errors. ?  ?Vanessa Redford, MD ?12/25/21 1920 ? ?

## 2021-12-25 NOTE — Telephone Encounter (Signed)
CRITICAL VALUE STICKER ? ?CRITICAL VALUE: White Count 22.4 ? ?RECEIVER (on-site recipient of call): Sharee Pimple ? ?DATE & TIME NOTIFIED: 12/25/2021 2:30pm ? ?MESSENGER (representative from lab): Santiago Glad ? ?MD NOTIFIED: NP Laverna Peace ? ?TIME OF NOTIFICATION: 2:37pm ? ?RESPONSE:   ?

## 2021-12-25 NOTE — Telephone Encounter (Signed)
Per [name], pt advised to go to ER STAT. Likely [facility]. Needs IV abx and fluids. ?Pt agrees, states will go now. ?Will sched f/u after discharge. ?

## 2021-12-26 ENCOUNTER — Other Ambulatory Visit: Payer: Self-pay

## 2021-12-26 LAB — PATHOLOGIST SMEAR REVIEW

## 2021-12-26 LAB — MONO QUAL W/RFLX QN: Mono Qual W/Rflx Qn: NEGATIVE

## 2021-12-27 LAB — CULTURE, GROUP A STREP
MICRO NUMBER:: 13074007
SPECIMEN QUALITY:: ADEQUATE

## 2021-12-30 ENCOUNTER — Encounter: Payer: Self-pay | Admitting: Adult Health

## 2021-12-30 LAB — CULTURE, BLOOD (ROUTINE X 2)
Culture: NO GROWTH
Culture: NO GROWTH

## 2021-12-30 NOTE — Progress Notes (Signed)
Chest x ray within normal limits.

## 2021-12-30 NOTE — Progress Notes (Signed)
CBC she was sent to ED last week.  ?How is she feeling ?  ?Mono and strep are negative.  ? ?Will need to recheck her CBC by Friday. Ok to add and schedule.

## 2021-12-31 ENCOUNTER — Ambulatory Visit (INDEPENDENT_AMBULATORY_CARE_PROVIDER_SITE_OTHER): Payer: 59

## 2021-12-31 ENCOUNTER — Ambulatory Visit (INDEPENDENT_AMBULATORY_CARE_PROVIDER_SITE_OTHER): Payer: 59 | Admitting: Adult Health

## 2021-12-31 ENCOUNTER — Encounter: Payer: Self-pay | Admitting: Adult Health

## 2021-12-31 ENCOUNTER — Telehealth: Payer: Self-pay

## 2021-12-31 ENCOUNTER — Other Ambulatory Visit: Payer: Self-pay

## 2021-12-31 ENCOUNTER — Telehealth: Payer: Self-pay | Admitting: *Deleted

## 2021-12-31 VITALS — BP 136/80 | HR 74 | Temp 98.0°F | Ht 64.0 in | Wt 176.0 lb

## 2021-12-31 DIAGNOSIS — J90 Pleural effusion, not elsewhere classified: Secondary | ICD-10-CM

## 2021-12-31 DIAGNOSIS — R062 Wheezing: Secondary | ICD-10-CM

## 2021-12-31 DIAGNOSIS — D72829 Elevated white blood cell count, unspecified: Secondary | ICD-10-CM

## 2021-12-31 DIAGNOSIS — R590 Localized enlarged lymph nodes: Secondary | ICD-10-CM

## 2021-12-31 DIAGNOSIS — B370 Candidal stomatitis: Secondary | ICD-10-CM

## 2021-12-31 DIAGNOSIS — I4510 Unspecified right bundle-branch block: Secondary | ICD-10-CM

## 2021-12-31 DIAGNOSIS — J069 Acute upper respiratory infection, unspecified: Secondary | ICD-10-CM

## 2021-12-31 DIAGNOSIS — B9689 Other specified bacterial agents as the cause of diseases classified elsewhere: Secondary | ICD-10-CM

## 2021-12-31 LAB — CBC WITH DIFFERENTIAL/PLATELET
Basophils Absolute: 0.1 10*3/uL (ref 0.0–0.1)
Basophils Relative: 0.3 % (ref 0.0–3.0)
Eosinophils Absolute: 0.2 10*3/uL (ref 0.0–0.7)
Eosinophils Relative: 1 % (ref 0.0–5.0)
HCT: 40.8 % (ref 36.0–46.0)
Hemoglobin: 13.8 g/dL (ref 12.0–15.0)
Lymphocytes Relative: 51.9 % — ABNORMAL HIGH (ref 12.0–46.0)
Lymphs Abs: 11.1 10*3/uL — ABNORMAL HIGH (ref 0.7–4.0)
MCHC: 33.9 g/dL (ref 30.0–36.0)
MCV: 93.3 fl (ref 78.0–100.0)
Monocytes Absolute: 1 10*3/uL (ref 0.1–1.0)
Monocytes Relative: 4.8 % (ref 3.0–12.0)
Neutro Abs: 9 10*3/uL — ABNORMAL HIGH (ref 1.4–7.7)
Neutrophils Relative %: 42 % — ABNORMAL LOW (ref 43.0–77.0)
Platelets: 405 10*3/uL — ABNORMAL HIGH (ref 150.0–400.0)
RBC: 4.37 Mil/uL (ref 3.87–5.11)
RDW: 13.1 % (ref 11.5–15.5)
WBC: 21.5 10*3/uL (ref 4.0–10.5)

## 2021-12-31 LAB — COMPREHENSIVE METABOLIC PANEL
ALT: 60 U/L — ABNORMAL HIGH (ref 0–35)
AST: 32 U/L (ref 0–37)
Albumin: 4.4 g/dL (ref 3.5–5.2)
Alkaline Phosphatase: 74 U/L (ref 39–117)
BUN: 18 mg/dL (ref 6–23)
CO2: 35 mEq/L — ABNORMAL HIGH (ref 19–32)
Calcium: 9.6 mg/dL (ref 8.4–10.5)
Chloride: 96 mEq/L (ref 96–112)
Creatinine, Ser: 0.76 mg/dL (ref 0.40–1.20)
GFR: 91.46 mL/min (ref >60.00)
Glucose, Bld: 100 mg/dL — ABNORMAL HIGH (ref 70–99)
Potassium: 4.5 mEq/L (ref 3.5–5.1)
Sodium: 136 mEq/L (ref 135–145)
Total Bilirubin: 0.3 mg/dL (ref 0.2–1.2)
Total Protein: 6.8 g/dL (ref 6.0–8.3)

## 2021-12-31 IMAGING — DX DG CHEST 2V
2 series · 2 of 2 positions shown · non-contrast
Comparison: [DATE]

CLINICAL DATA: Cough.

EXAM:
CHEST - 2 VIEW

[chest pa]
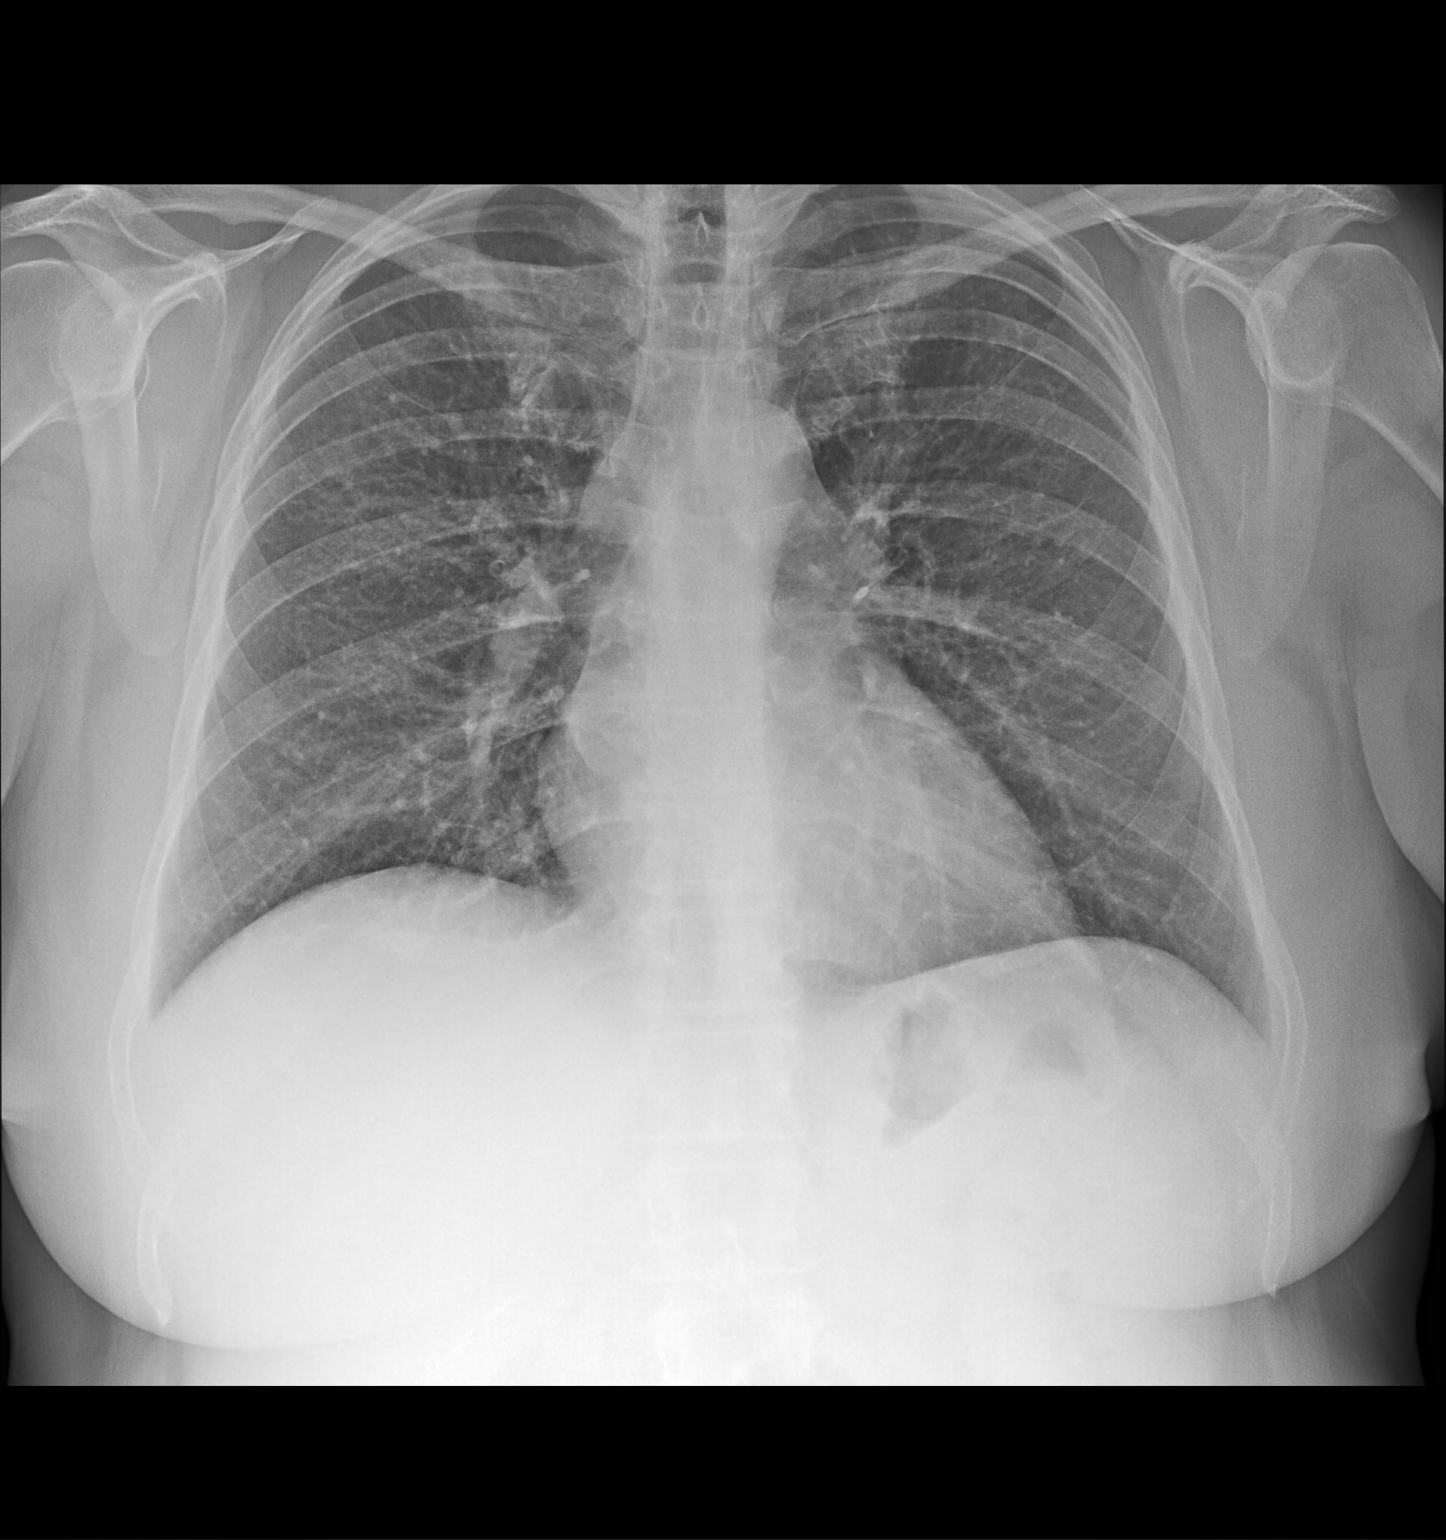

[chest lat]
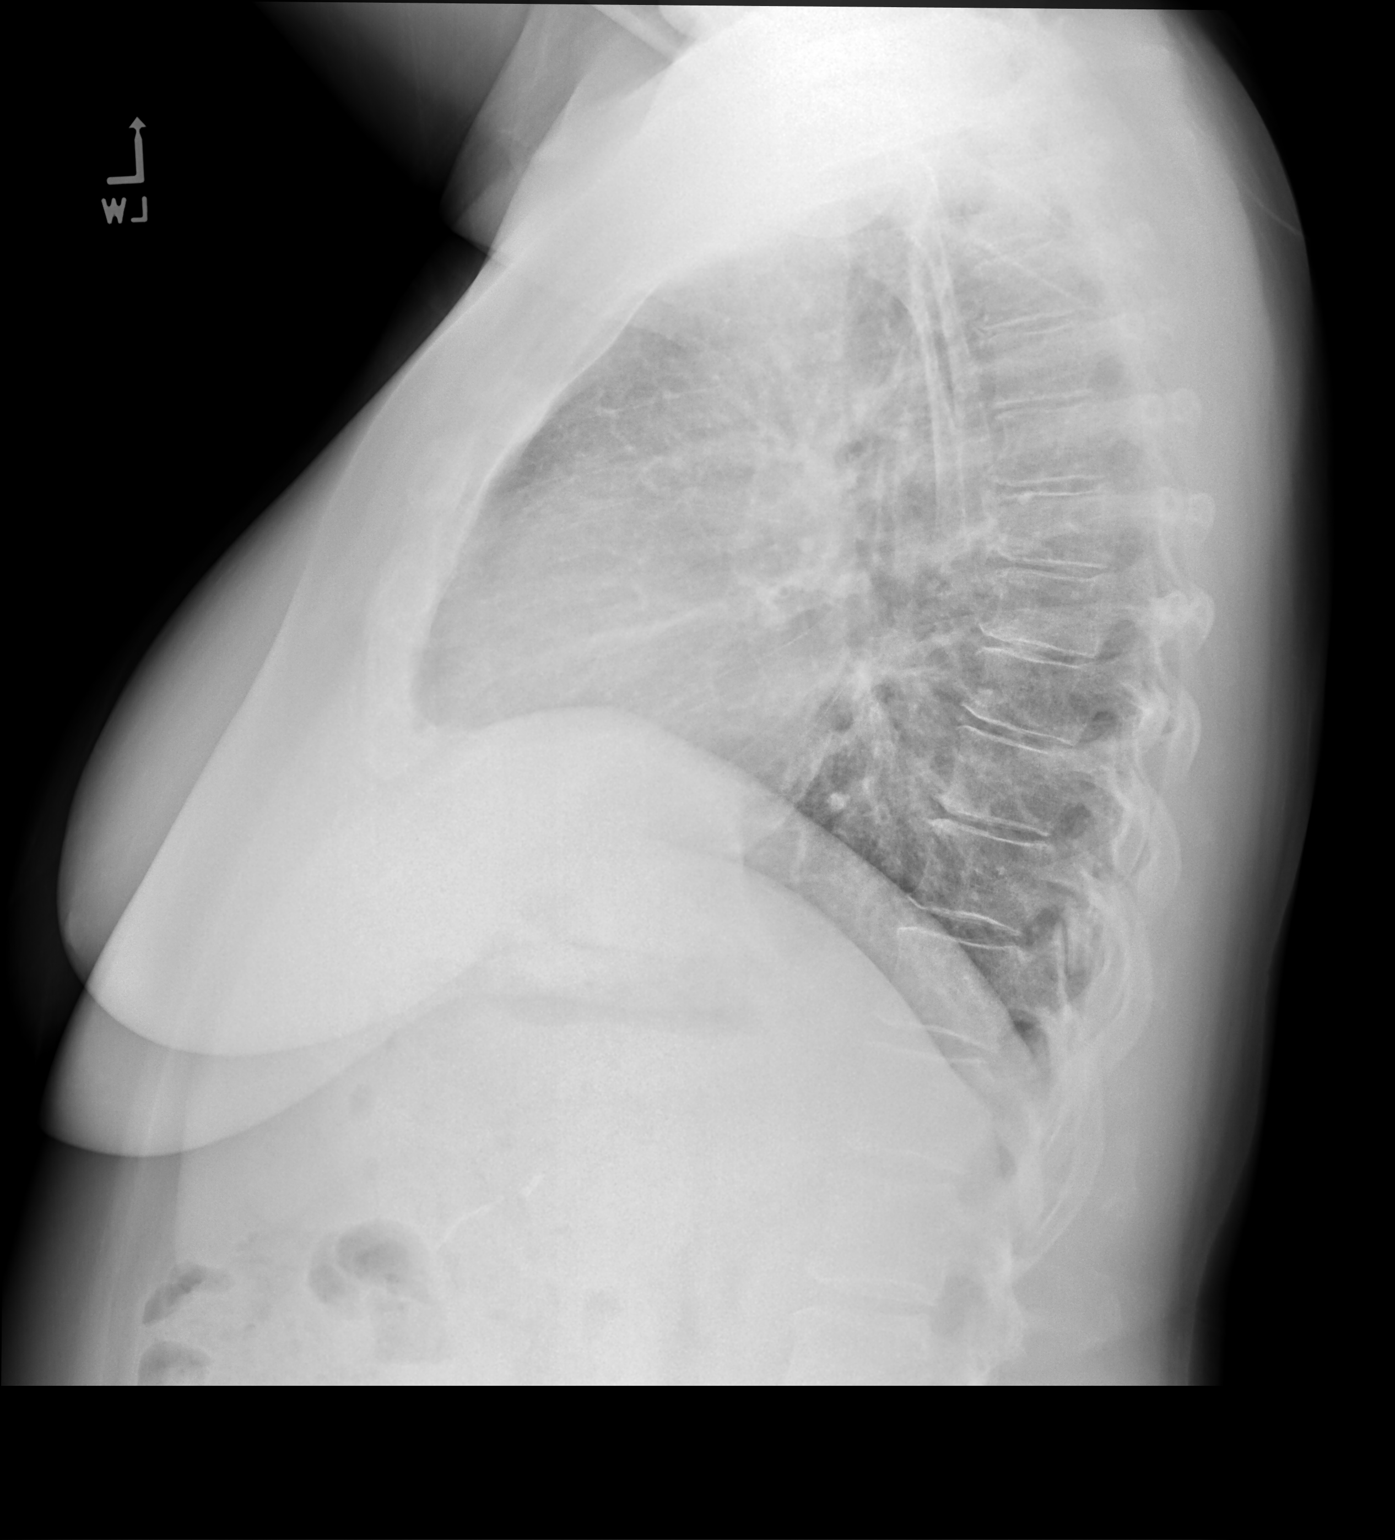

[2 of 2 positions shown; findings below may reference images not displayed]

FINDINGS: The heart size and mediastinal contours are within normal limits.
Both lungs are clear. The visualized skeletal structures are
unremarkable.
IMPRESSION: No active cardiopulmonary disease.

## 2021-12-31 MED ORDER — NYSTATIN 100000 UNIT/ML MT SUSP
5.0000 mL | Freq: Four times a day (QID) | OROMUCOSAL | 0 refills | Status: DC
  Filled 2021-12-31: qty 60, 3d supply, fill #0

## 2021-12-31 MED ORDER — ALBUTEROL SULFATE HFA 108 (90 BASE) MCG/ACT IN AERS
1.0000 | INHALATION_SPRAY | Freq: Four times a day (QID) | RESPIRATORY_TRACT | 2 refills | Status: DC | PRN
  Filled 2021-12-31: qty 8.5, 25d supply, fill #0
  Filled 2022-05-11: qty 8.5, 25d supply, fill #1

## 2021-12-31 NOTE — Telephone Encounter (Signed)
Per [name] - pt advised to go immediatley to [facility] for IV abx  - WBC still elevated at 21.5 . ? ?Pt understands and gave verbal agreement. ?

## 2021-12-31 NOTE — Telephone Encounter (Signed)
CRITICAL VALUE STICKER ? ?CRITICAL VALUE: WBC- 21.5 ? ?RECEIVER (on-site recipient of call): Jari Favre, CMA/XT ? ?DATE & TIME NOTIFIED: 12/31/21 @ 3:06pm ? ?MESSENGER (representative from lab): Santiago Glad @ [facility] ? ?MD NOTIFIED: Laverna Peace, NP ? ?TIME OF NOTIFICATION:3:06pm ? ?RESPONSE:  ? ?

## 2021-12-31 NOTE — Progress Notes (Signed)
Kathleen Carr, called per providers advice, patient is to go to [facility] or [facility] for further evaluation and likely needs IV antibiotics, I do not feel this increase is all from her steroid use and she just finished Augmentin and azithromycin.

## 2021-12-31 NOTE — Progress Notes (Signed)
CO 2 also elevated, at 35 ALT liver enzyme also elevated at 60. This is new today.  ?Sent to [facility] or [facility] for critical lab  ?WBC 21.5,  ?platelets elevated 405,  ?lymphocytes relative 51.9. neutrophils 42.0 ?Lymphocytes absolute 9.0 ?Lymphocyte absolute 11.1 ? ?Patient verbalized understanding of all instructions given and denies any further questions at this time.  ?

## 2021-12-31 NOTE — Progress Notes (Signed)
Acute Office Visit  Subjective:    Patient ID: Kathleen Carr, female    DOB: Apr 04, 1971, 51 y.o.   MRN: [id number]  Chief Complaint  Patient presents with   Follow-up    F/u from ER/Bacterial upper resp infec - Pt reports feeling better. Was seen in ER as directed. Given Azithromycin, and prednisone. Has finished. Pt now w/ thrush from antibiotics. C/o sores and blisters in mouth.    HPI Patient is in today for follow up on suspected bacterial respiratory infection questionable pneumonia. However chest x ray was within normal limits last visit 12/25/2021 She was started on Augmentin, and azithromycin was added by ER after she was sent due to very elevated white blood cell count by this provider on the day of her last visit 12/25/2021. EKG did show incomplete Right BBB at ER and pleural effusion. Negative for DVT on CT angiogram 12/25/2021.   She has completed antibiotics and is feeling some better but " not a hundred percent". Still has cough.  Non productive cough.  Does request refill on albuterol. She has not been using inhaler just wants to have on hand if needed, denies any wheezing.  Denies any urinary symptoms.   Patient  denies any fever, body aches,chills, rash, chest pain, shortness of breath, nausea, vomiting, or diarrhea.  Denies dizziness, lightheadedness, pre syncopal or syncopal episodes.   Past Medical History:  Diagnosis Date   Allergy    Anxiety    Arthritis    Asthma    Bipolar depression ([place])    Depression    Diabetes mellitus without complication (HCC)    GERD (gastroesophageal reflux disease)    Hypertension    PTSD (post-traumatic stress disorder)    PTSD (post-traumatic stress disorder)    Thyroid disease     Past Surgical History:  Procedure Laterality Date   ABDOMINAL HYSTERECTOMY     CHOLECYSTECTOMY     KNEE ARTHROSCOPY Bilateral    TONSILLECTOMY      Family History  Problem Relation Age of Onset   Hypertension Mother    Thyroid disease  Mother    Cancer Father 10       carcinoid, right lung   Ulcerative colitis Father    Glaucoma Maternal Grandmother    Renal Disease Maternal Grandfather    Diabetes Maternal Grandfather        Type2   Stroke Paternal Grandfather    Hypertension Paternal Grandfather     Social History   Socioeconomic History   Marital status: Married    Spouse name: Not on file   Number of children: Not on file   Years of education: Not on file   Highest education level: Not on file  Occupational History   Not on file  Tobacco Use   Smoking status: Some Days    Types: Cigarettes    Last attempt to quit: 05/25/2020    Years since quitting: 1.6   Smokeless tobacco: Never  Vaping Use   Vaping Use: Some days  Substance and Sexual Activity   Alcohol use: Not Currently   Drug use: Not Currently   Sexual activity: Not Currently    Partners: Female    Comment: married to wife  Other Topics Concern   Not on file  Social History Narrative   Not on file   Social Determinants of Health   Financial Resource Strain: Not on file  Food Insecurity: Not on file  Transportation Needs: Not on file  Physical Activity: Not  on file  Stress: Not on file  Social Connections: Not on file  Intimate Partner Violence: Not on file    Outpatient Medications Prior to Visit  Medication Sig Dispense Refill   ALPRAZolam (XANAX) 0.25 MG tablet Take 1-2 tablets (0.25-0.5 mg total) by mouth 2 (two) times daily as needed for anxiety (will cause drowsiness.). 30 tablet 0   cholecalciferol (VITAMIN D3) 25 MCG (1000 UNIT) tablet Take 1,000 Units by mouth daily.     cyclobenzaprine (FLEXERIL) 10 MG tablet Take 1 tablet (10 mg total) by mouth 3 (three) times daily as needed for muscle spasms (will cause drowsiness.). 30 tablet 0   levothyroxine (SYNTHROID) 50 MCG tablet TAKE 1 TABLET BY MOUTH DAILY BEFORE BREAKFAST. 90 tablet 1   loratadine (CLARITIN) 10 MG tablet Take 10 mg by mouth daily.     metFORMIN (GLUCOPHAGE)  500 MG tablet Take 1 tablet (500 mg total) by mouth 2 (two) times daily with a meal. 180 tablet 3   oxybutynin (DITROPAN XL) 10 MG 24 hr tablet Take 1 tablet (10 mg total) by mouth at bedtime. 90 tablet 1   pantoprazole (PROTONIX) 40 MG tablet Take 1 tablet (40 mg total) by mouth daily. 90 tablet 1   QUEtiapine (SEROQUEL) 300 MG tablet TAKE 1 TABLET (300 MG TOTAL) BY MOUTH AT BEDTIME. 90 tablet 1   rosuvastatin (CRESTOR) 5 MG tablet TAKE 1 TABLET BY MOUTH DAILY. 90 tablet 1   sertraline (ZOLOFT) 100 MG tablet TAKE 1 TABLET BY MOUTH DAILY 90 tablet 0   traMADol (ULTRAM) 50 MG tablet Take 1-2 tablets (50-100 mg total) by mouth daily as needed (not to use with any other narcotic or cough syrup/ sedative.). 60 tablet 0   zolpidem (AMBIEN) 5 MG tablet TAKE 1 TABLET (5 MG TOTAL) BY MOUTH AT BEDTIME AS NEEDED FOR SLEEP. 30 tablet 3   albuterol (PROAIR HFA) 108 (90 Base) MCG/ACT inhaler Inhale 1-2 puffs into the lungs every 6 (six) hours as needed for wheezing. 8.5 g 1   nystatin (MYCOSTATIN) 100000 UNIT/ML suspension Use as needed up to four times daily 72m in mouth swish hold and spit. 60 mL 0   amoxicillin-clavulanate (AUGMENTIN) 875-125 MG tablet Take 1 tablet by mouth 2 (two) times daily. (Patient not taking: Reported on 12/31/2021) 20 tablet 0   azithromycin (ZITHROMAX Z-PAK) 250 MG tablet Take 2 tablets (500 mg) on  Day 1,  followed by 1 tablet (250 mg) once daily on Days 2 through 5. (Patient not taking: Reported on 12/31/2021) 6 each 0   guaiFENesin-codeine (ROBITUSSIN AC) 100-10 MG/5ML syrup Take 5 mLs by mouth at bedtime as needed for cough. (Patient not taking: Reported on 12/31/2021) 118 mL 0   predniSONE (DELTASONE) 10 MG tablet Take 6 tabs on day 1, 5 tabs on day 2, 4 tabs on day 3, 3 tabs on day 4, 2 tabs on day 5, 1 tab on day 6. Then stop. (Patient not taking: Reported on 12/31/2021) 21 tablet 0   No facility-administered medications prior to visit.    Allergies  Allergen Reactions   Dust Mite  Mixed Allergen Ext [Mite (D. 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 Negative for activity change, appetite change, chills, diaphoresis, fever and unexpected weight change.  HENT: Negative.    Eyes: Negative.   Respiratory:  Positive for cough. Negative for apnea, choking, chest tightness, shortness of breath, wheezing and stridor.   Cardiovascular: Negative.   Gastrointestinal: Negative.   Genitourinary: Negative.   Musculoskeletal: Negative.   Neurological: Negative.   Psychiatric/Behavioral: Negative.        Objective:    Physical Exam Constitutional:      General: She is not in acute distress.    Appearance: Normal appearance. She is not ill-appearing, toxic-appearing or diaphoretic.     Comments: Patient is alert and oriented and responsive to questions Engages in eye contact with provider. Speaks in full sentences without any pauses without any shortness of breath or distress.     HENT:     Head: Normocephalic and atraumatic.     Right Ear: Tympanic membrane, ear canal and external ear normal. There is no impacted cerumen.     Left Ear: Tympanic membrane, ear canal and external ear normal. There is no impacted cerumen.     Nose: Congestion present.     Mouth/Throat:     Mouth: Mucous membranes are moist.     Pharynx: No oropharyngeal exudate.  Eyes:     General: No scleral icterus.       Right eye: No discharge.        Left eye: No discharge.     Pupils: Pupils are equal, round, and reactive to light.  Neck:     Vascular: No carotid bruit.  Cardiovascular:     Rate and Rhythm: Normal rate and regular rhythm.     Pulses: Normal pulses.     Heart sounds: Normal heart sounds. No murmur heard.   No friction rub. No gallop.  Pulmonary:     Effort: Pulmonary  effort is normal. No respiratory distress.     Breath sounds: Normal breath sounds. No stridor. No wheezing, rhonchi or rales.  Chest:     Chest wall: No tenderness.  Abdominal:     General: There is no distension.     Palpations: Abdomen is soft.  Musculoskeletal:        General: Normal range of motion.     Cervical back: Normal range of motion and neck supple. No rigidity or tenderness.  Lymphadenopathy:     Cervical: No cervical adenopathy.  Skin:    General: Skin is warm.     Findings: No erythema or rash.  Neurological:     Mental Status: She is oriented to person, place, and time. Mental status is at baseline.     Gait: Gait normal.  Psychiatric:        Mood and Affect: Mood normal.        Behavior: Behavior normal.        Thought Content: Thought content normal.        Judgment: Judgment normal.    BP 136/80 (BP Location: Left Arm, Patient Position: Sitting, Cuff Size: Small)    Pulse 74    Temp 98 F (36.7 C) (Oral)    Ht 5' 4"  (1.626 m)    Wt 176 lb (79.8 kg)    SpO2 95%    BMI 30.21 kg/m  Wt Readings from Last 3 Encounters:  12/31/21 176 lb (79.8 kg)  12/25/21 176 lb (79.8 kg)  12/25/21 180 lb (81.6 kg)     Health Maintenance Due  Topic Date Due   FOOT EXAM  Never done   OPHTHALMOLOGY EXAM  Never done   PAP SMEAR-Modifier  Never done   MAMMOGRAM  Never done    There are no preventive care reminders to display for this patient.   Lab Results  Component Value Date   TSH 2.11 11/27/2021   Lab Results  Component Value Date   WBC 21.5 Repeated and verified X2. (HH) 12/31/2021   HGB 13.8 12/31/2021   HCT 40.8 12/31/2021   MCV 93.3 12/31/2021   PLT 405.0 (H) 12/31/2021   Lab Results  Component Value Date   NA 136 12/31/2021   K 4.5 12/31/2021   CO2 35 (H) 12/31/2021   GLUCOSE 100 (H) 12/31/2021   BUN 18 12/31/2021   CREATININE 0.76 12/31/2021   BILITOT 0.3 12/31/2021   ALKPHOS 74 12/31/2021   AST 32 12/31/2021   ALT 60 (H) 12/31/2021   PROT 6.8  12/31/2021   ALBUMIN 4.4 12/31/2021   CALCIUM 9.6 12/31/2021   ANIONGAP 7 12/25/2021   EGFR 83 01/14/2021   GFR 91.46 12/31/2021   Lab Results  Component Value Date   CHOL 187 08/16/2021   Lab Results  Component Value Date   HDL 56.10 08/16/2021   Lab Results  Component Value Date   LDLCALC 106 (H) 08/02/2020   Lab Results  Component Value Date   TRIG 214.0 (H) 08/16/2021   Lab Results  Component Value Date   CHOLHDL 3 08/16/2021   Lab Results  Component Value Date   HGBA1C 6.6 (H) 11/27/2021       Assessment & Plan:   Problem List Items Addressed This Visit       Other   Leukocytosis   Relevant Orders   CBC with Differential/Platelet (Completed)   Comprehensive metabolic panel (Completed)   Other Visit Diagnoses     Oral thrush    -  Primary   Relevant Medications   nystatin (MYCOSTATIN) 100000 UNIT/ML suspension   Bacterial upper respiratory infection       Relevant Medications   nystatin (MYCOSTATIN) 100000 UNIT/ML suspension   albuterol (PROAIR HFA) 108 (90 Base) MCG/ACT inhaler   Wheezing       Relevant Medications   albuterol (PROAIR HFA) 108 (90 Base) MCG/ACT inhaler   Other Relevant Orders   DG Chest 2 View (Completed)   Right bundle branch block       Relevant Orders   EKG 12-Lead (Completed)   Ambulatory referral to Cardiology   Hilar lymphadenopathy       Relevant Orders   Ambulatory referral to Pulmonology   Pleural effusion       Relevant Orders   Ambulatory referral to Pulmonology      Orders Placed This Encounter  Procedures   DG Chest 2 View    Order Specific Question:   Reason for Exam (SYMPTOM  OR DIAGNOSIS REQUIRED)    Answer:   cough rule out pneumonia, pleural infusion    Order Specific Question:   Preferred imaging location?    Answer:   [facility]   CBC with Differential/Platelet   Comprehensive metabolic panel   Ambulatory referral to Cardiology    Referral Priority:   Routine    Referral Type:    Consultation    Referral Reason:   Specialty Services Required    Referred to Provider:   Nelva Bush, MD    Requested Specialty:   Cardiology    Number of Visits Requested:   1   Ambulatory referral to Pulmonology    Referral Priority:   Routine    Referral Type:  Consultation    Referral Reason:   Specialty Services Required    Referred to Provider:   Tyler Pita, MD    Requested Specialty:   Pulmonary Disease    Number of Visits Requested:   1   EKG 12-Lead     Meds ordered this encounter  Medications   nystatin (MYCOSTATIN) 100000 UNIT/ML suspension    Sig: Take 5 mLs (500,000 Units total) by mouth 4 (four) times daily.    Dispense:  60 mL    Refill:  0   albuterol (PROAIR HFA) 108 (90 Base) MCG/ACT inhaler    Sig: Inhale 1-2 puffs into the lungs every 6 (six) hours as needed for wheezing.    Dispense:  8.5 g    Refill:  2   Recheck CBC today at office visit and results received, patient was sent to ER for further evaluation as I do not feel this is all related to steroids.   Red Flags discussed. The patient was given clear instructions to go to ER or return to medical center if any red flags develop, symptoms do not improve, worsen or new problems develop. They verbalized understanding.  Return in about 3 days (around 01/03/2022), or if symptoms worsen or fail to improve, for at any time for any worsening symptoms, Go to Emergency room/ urgent care if worse.  Patient is aware that his primary care provider is leaving the office and last day will be January 16, 2022 and he will need to establish care with a new primary care provider, list is available upfront for patient to help him with establishing care or they can choose a provider of their choice.Patient verbalized understanding of all instructions given and denies any further questions at this time.    Marcille Buffy, FNP

## 2021-12-31 NOTE — Patient Instructions (Signed)
CBC and chest x ray today.  Call if not feeling better and continuing to improve.   Community-Acquired Pneumonia, Adult Pneumonia is an infection of the lungs. It causes irritation and swelling in the airways of the lungs. Mucus and fluid may also build up inside the airways. This may cause coughing and trouble breathing. One type of pneumonia can happen while you are in a hospital. A different type can happen when you are not in a hospital (community-acquired pneumonia). What are the causes? This condition is caused by germs (viruses, bacteria, or fungi). Some types of germs can spread from person to person. Pneumonia is not thought to spread from person to person. What increases the risk? You are more likely to develop this condition if: You have a long-term (chronic) disease, such as: Disease of the lungs. This may be chronic obstructive pulmonary disease (COPD) or asthma. Heart failure. Cystic fibrosis. Diabetes. Kidney disease. Sickle cell disease. HIV. You have other health problems, such as: Your body's defense system (immune system) is weak. A condition that may cause you to breathe in fluids from your mouth and nose. You had your spleen taken out. You do not take good care of your teeth and mouth (poor dental hygiene). You use or have used tobacco products. You travel where the germs that cause this illness are common. You are near certain animals or the places they live. You are older than 51 years of age. What are the signs or symptoms? Symptoms of this condition include: A cough. A fever. Sweating or chills. Chest pain, often when you breathe deeply or cough. Breathing problems, such as: Fast breathing. Trouble breathing. Shortness of breath. Feeling tired (fatigued). Muscle aches. How is this treated? Treatment for this condition depends on many things, such as: The cause of your illness. Your medicines. Your other health problems. Most adults can be treated  at home. Sometimes, treatment must happen in a hospital. Treatment may include medicines to kill germs. Medicines may depend on which germ caused your illness. Very bad pneumonia is rare. If you get it, you may: Have a machine to help you breathe. Have fluid taken away from around your lungs. Follow these instructions at home: Medicines Take over-the-counter and prescription medicines only as told by your doctor. Take cough medicine only if you are losing sleep. Cough medicine can keep your body from taking mucus away from your lungs. If you were prescribed an antibiotic medicine, take it as told by your doctor. Do not stop taking the antibiotic even if you start to feel better. Lifestyle   Do not drink alcohol. Do not use any products that contain nicotine or tobacco, such as cigarettes, e-cigarettes, and chewing tobacco. If you need help quitting, ask your doctor. Eat a healthy diet. This includes a lot of vegetables, fruits, whole grains, low-fat dairy products, and low-fat (lean) protein. General instructions  Rest a lot. Sleep for at least 8 hours each night. Sleep with your head and neck raised. Put a few pillows under your head or sleep in a reclining chair. Return to your normal activities as told by your doctor. Ask your doctor what activities are safe for you. Drink enough fluid to keep your pee (urine) pale yellow. If your throat is sore, rinse your mouth often with salt water. To make salt water, dissolve -1 tsp (3-6 g) of salt in 1 cup (237 mL) of warm water. Keep all follow-up visits as told by your doctor. This is important. How is this  prevented? You can lower your risk of pneumonia by: Getting the pneumonia shot (vaccine). These shots have different types and schedules. Ask your doctor what works best for you. Think about getting this shot if: You are older than 51 years of age. You are 89-9 years of age and: You are being treated for cancer. You have long-term lung  disease. You have other problems that affect your body's defense system. Ask your doctor if you have one of these. Getting your flu shot every year. Ask your doctor which type of shot is best for you. Going to the dentist as often as told. Washing your hands often with soap and water for at least 20 seconds. If you cannot use soap and water, use hand sanitizer. Contact a doctor if: You have a fever. You lose sleep because your cough medicine does not help. Get help right away if: You are short of breath and this gets worse. You have more chest pain. Your sickness gets worse. This is very serious if: You are an older adult. Your body's defense system is weak. You cough up blood. These symptoms may be an emergency. Do not wait to see if the symptoms will go away. Get medical help right away. Call your local emergency services (911 in the [place] Do not drive yourself to the hospital. Summary Pneumonia is an infection of the lungs. Community-acquired pneumonia affects people who have not been in the hospital. Certain germs can cause this infection. This condition may be treated with medicines that kill germs. For very bad pneumonia, you may need a hospital stay and treatment to help with breathing. This information is not intended to replace advice given to you by your health care provider. Make sure you discuss any questions you have with your health care provider. Document Revised: 07/26/2019 Document Reviewed: 07/26/2019 Elsevier Patient Education  [place].    Right Bundle Branch Block Right bundle branch block (RBBB) is a problem with the way that electrical impulses pass through the heart (electrical conduction abnormality). The heart depends on an electrical pulse to beat normally. The electrical signal for a heartbeat starts in the upper chambers of the heart (atria) and then travels to the two lower chambers (left and rightventricles). An RBBB is a partial or complete block  of the pathway that carries the signal to the right ventricle. If you have RBBB, the right side of your heart beats a little more slowly than the left side. RBBB may be a warning of heart disease or a lung problem. What are the causes? This condition may be caused by: Heart attack (myocardial infarction). Being born with a heart defect (congenital heart disease). A blood clot that flows into the lung (pulmonary embolism). Infection of heart muscle (myocarditis). High blood pressure. In some cases, the cause may not be known. What increases the risk? The following factors may make you more likely to develop this condition: Being female. Being 32 years of age or older. Having heart disease. Having had a heart attack or heart surgery. Having an enlarged heart. Having a hole in the walls between the chambers of the heart (septal defect). What are the signs or symptoms? This condition does not typically cause symptoms. How is this diagnosed? This condition may be diagnosed based on an electrocardiogram (ECG). It is often diagnosed when an ECG is done as part of a routine physical. You may also have imaging tests to find out more about your condition. These may include: Chest X-rays.  Echocardiogram. How is this treated? Treatment may not be needed for this condition if you do not have symptoms or any other heart problems. However, you may need to see your health care provider more often because RBBB can be a warning sign of future heart or lung problems. You may need treatment for another condition that may be causing RBBB. Follow these instructions at home: Lifestyle  Follow instructions from your health care provider about eating or drinking restrictions. Follow a heart-healthy diet and maintain a healthy weight. Work with a dietitian to create an eating plan that is best for you. Do not use any products that contain nicotine or tobacco, such as cigarettes, e-cigarettes, and chewing  tobacco. If you need help quitting, ask your health care provider. Activity Get regular exercise as told by your health care provider. Return to your normal activities as told by your health care provider. Ask your health care provider what activities are safe for you. General instructions Take over-the-counter and prescription medicines only as told by your health care provider. Keep all follow-up visits as told by your health care provider. This is important. Contact a health care provider if: You are light-headed. You faint. Get help right away if: You have chest pain. You have trouble breathing. These symptoms may represent a serious problem that is an emergency. Do not wait to see if the symptoms will go away. Get medical help right away. Call your local emergency services (911 in the [place] Do not drive yourself to the hospital.  Summary For the heart to beat normally, an electrical signal must travel to the heart's lower right chamber. Right bundle branch block (RBBB) is a partial or complete block of the pathway that carries that signal. This condition does not typically cause symptoms. Treatment may not be needed for RBBB if you do not have symptoms or any other heart problems. You may need to see your health care provider more often because RBBB can be a warning sign of future heart or lung problems. This information is not intended to replace advice given to you by your health care provider. Make sure you discuss any questions you have with your health care provider. Document Revised: 04/12/2019 Document Reviewed: 04/12/2019 Elsevier Patient Education  [place].

## 2022-01-01 ENCOUNTER — Encounter: Payer: Self-pay | Admitting: Adult Health

## 2022-01-01 ENCOUNTER — Other Ambulatory Visit: Payer: Self-pay | Admitting: Adult Health

## 2022-01-01 DIAGNOSIS — R1011 Right upper quadrant pain: Secondary | ICD-10-CM

## 2022-01-01 DIAGNOSIS — R051 Acute cough: Secondary | ICD-10-CM | POA: Insufficient documentation

## 2022-01-01 DIAGNOSIS — J029 Acute pharyngitis, unspecified: Secondary | ICD-10-CM | POA: Insufficient documentation

## 2022-01-01 DIAGNOSIS — R109 Unspecified abdominal pain: Secondary | ICD-10-CM

## 2022-01-01 DIAGNOSIS — B9689 Other specified bacterial agents as the cause of diseases classified elsewhere: Secondary | ICD-10-CM | POA: Insufficient documentation

## 2022-01-01 NOTE — Progress Notes (Signed)
Orders Placed This Encounter  ?Procedures  ? Urine Culture  ? US Abdomen Complete  ?  Order Specific Question:   Reason for Exam (SYMPTOM  OR DIAGNOSIS REQUIRED)  ?  Answer:   leukocytosis, RUQ pain, CT colitis questionable, elevated liver enzymes. flank pain  ?  Order Specific Question:   Preferred imaging location?  ?  Answer:   [facility]  ? CBC with Differential/Platelet  ? Lipase  ? Amylase  ? Comprehensive metabolic panel  ? Lactic acid, plasma  ? Bilirubin, fractionated(tot/dir/indir)  ? Acute Viral Hepatitis (HAV, HBV, HCV)  ? HIV antibody (with reflex)  ?  ?

## 2022-01-02 ENCOUNTER — Other Ambulatory Visit: Payer: Self-pay

## 2022-01-02 ENCOUNTER — Encounter: Payer: Self-pay | Admitting: Adult Health

## 2022-01-02 ENCOUNTER — Other Ambulatory Visit (INDEPENDENT_AMBULATORY_CARE_PROVIDER_SITE_OTHER): Payer: 59

## 2022-01-02 ENCOUNTER — Ambulatory Visit
Admission: RE | Admit: 2022-01-02 | Discharge: 2022-01-02 | Disposition: A | Discharge status: Home / Self Care | Payer: 59 | Source: Ambulatory Visit | Attending: Adult Health | Admitting: Adult Health

## 2022-01-02 ENCOUNTER — Other Ambulatory Visit: Payer: Self-pay | Admitting: Adult Health

## 2022-01-02 DIAGNOSIS — R109 Unspecified abdominal pain: Secondary | ICD-10-CM | POA: Insufficient documentation

## 2022-01-02 DIAGNOSIS — E039 Hypothyroidism, unspecified: Secondary | ICD-10-CM | POA: Diagnosis not present

## 2022-01-02 DIAGNOSIS — R1011 Right upper quadrant pain: Secondary | ICD-10-CM | POA: Diagnosis not present

## 2022-01-02 DIAGNOSIS — E559 Vitamin D deficiency, unspecified: Secondary | ICD-10-CM | POA: Diagnosis not present

## 2022-01-02 DIAGNOSIS — D72829 Elevated white blood cell count, unspecified: Secondary | ICD-10-CM

## 2022-01-02 DIAGNOSIS — E118 Type 2 diabetes mellitus with unspecified complications: Secondary | ICD-10-CM | POA: Diagnosis not present

## 2022-01-02 DIAGNOSIS — T3695XA Adverse effect of unspecified systemic antibiotic, initial encounter: Secondary | ICD-10-CM

## 2022-01-02 LAB — TROPONIN I (HIGH SENSITIVITY): High Sens Troponin I: 4 ng/L (ref 2–17)

## 2022-01-02 LAB — VITAMIN D 25 HYDROXY (VIT D DEFICIENCY, FRACTURES): VITD: 35.39 ng/mL (ref 30.00–100.00)

## 2022-01-02 LAB — TSH: TSH: 3.04 u[IU]/mL (ref 0.35–5.50)

## 2022-01-02 LAB — HEMOGLOBIN A1C: Hgb A1c MFr Bld: 7.2 % — ABNORMAL HIGH (ref 4.6–6.5)

## 2022-01-02 IMAGING — US US ABDOMEN COMPLETE
1 series · 15 of 25 positions shown · non-contrast
Comparison: None.

CLINICAL DATA: Leukocytosis. Right upper quadrant abdominal pain.
Prior cholecystectomy

EXAM:
ABDOMEN ULTRASOUND COMPLETE

[Series 1: us abdomen complete · 15 of 71 slices shown]
[im 1/71]
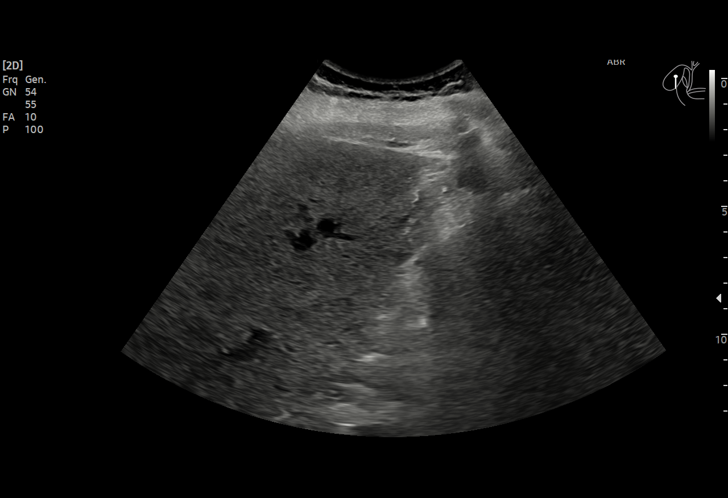
[im 6/71]
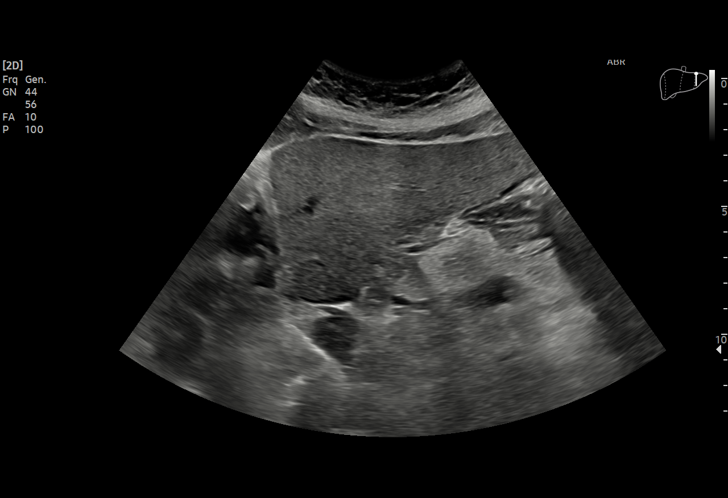
[im 12/71]
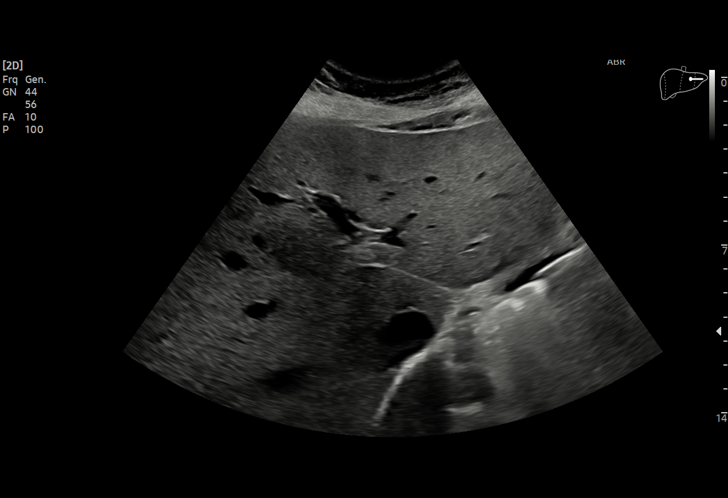
[im 15/71]
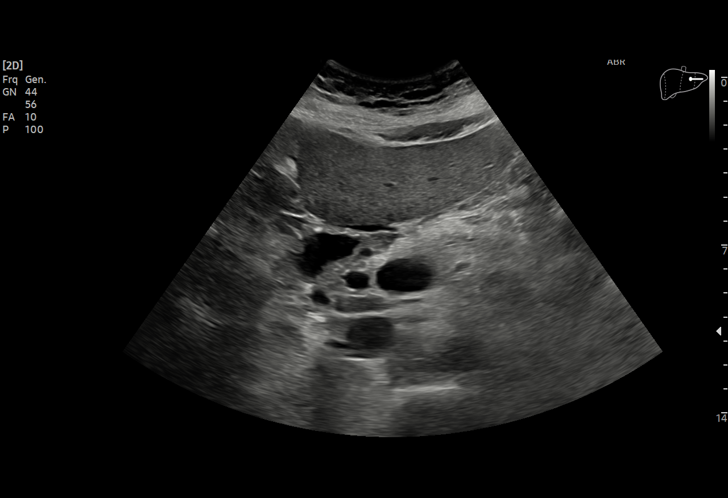
[im 21/71]
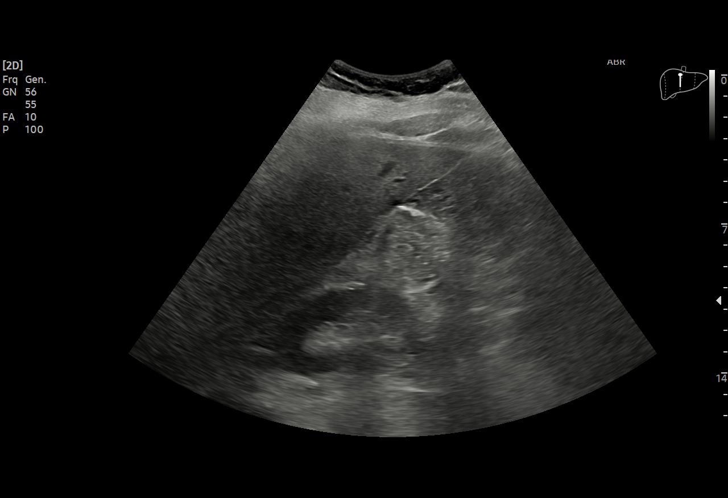
[im 27/71]
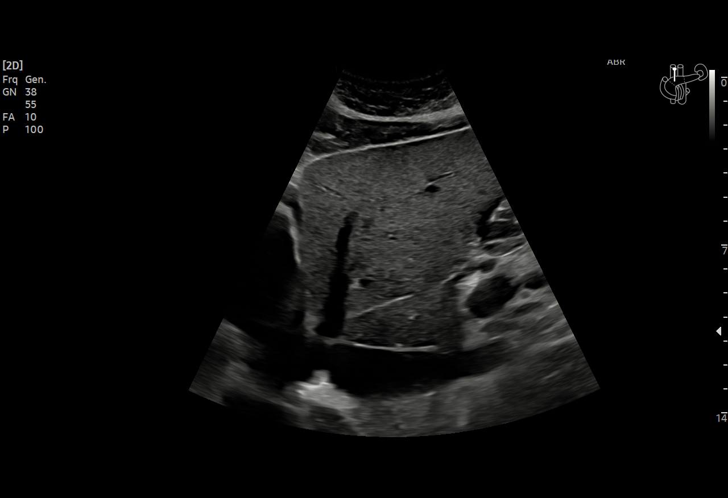
[im 30/71]
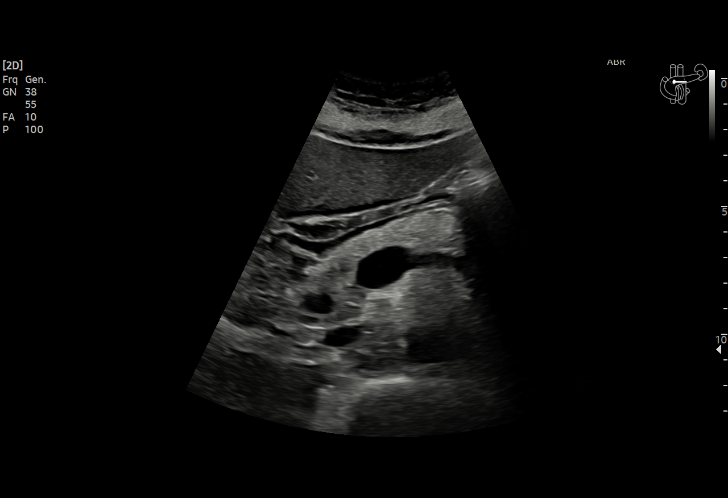
[im 36/71]
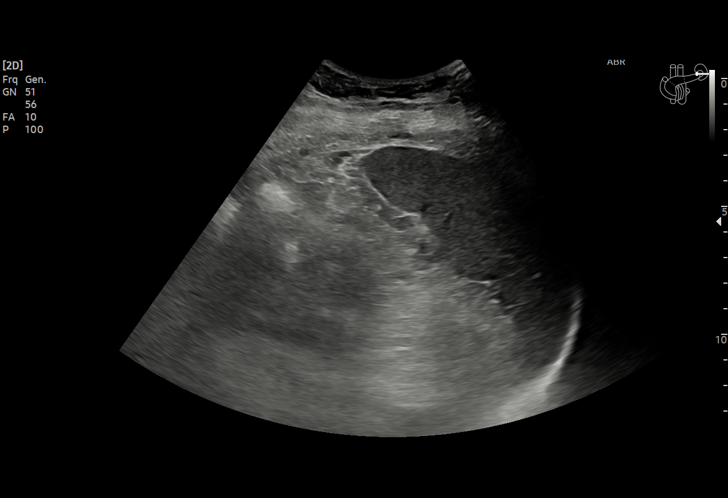
[im 41/71]
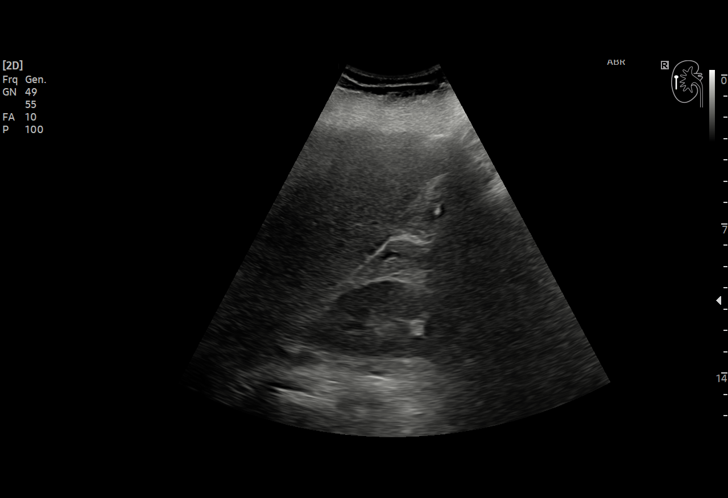
[im 44/71]
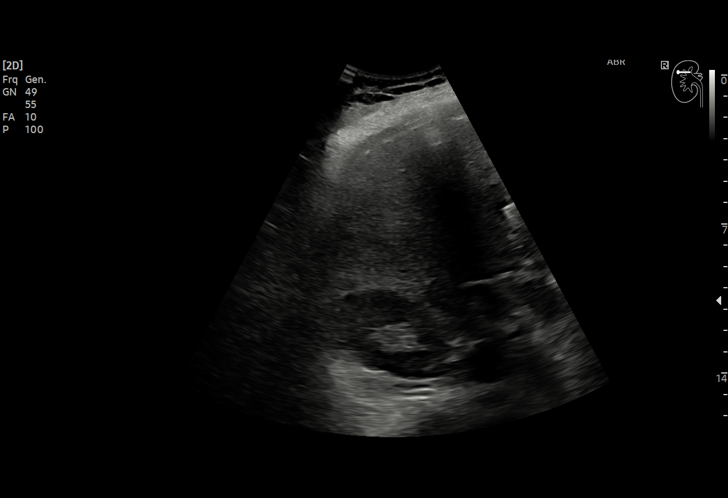
[im 50/71]
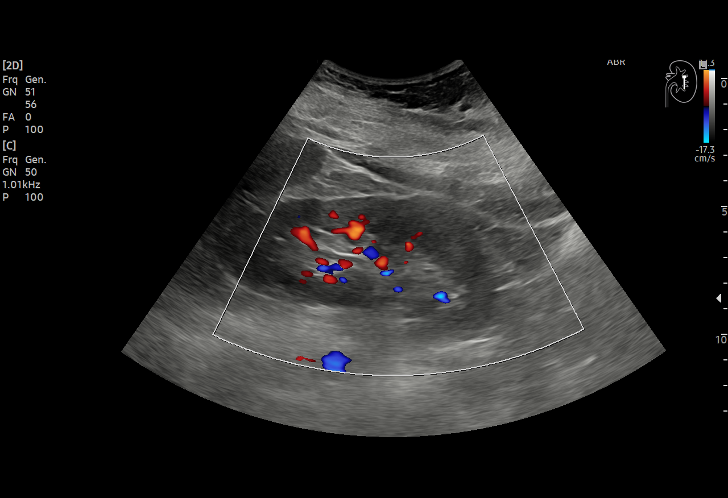
[im 56/71]
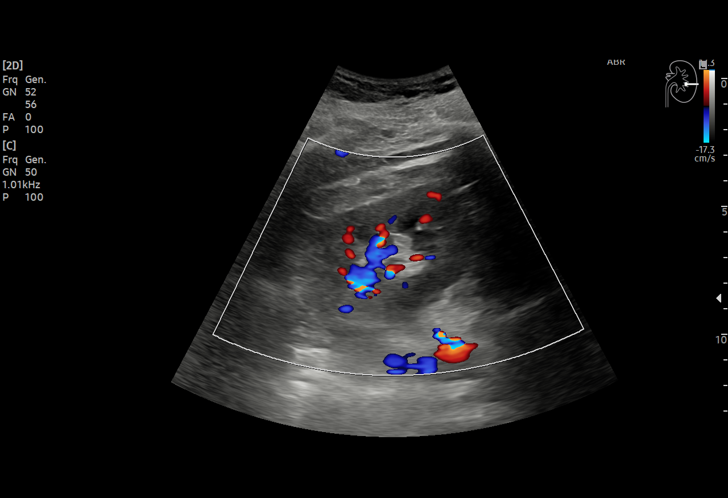
[im 59/71]
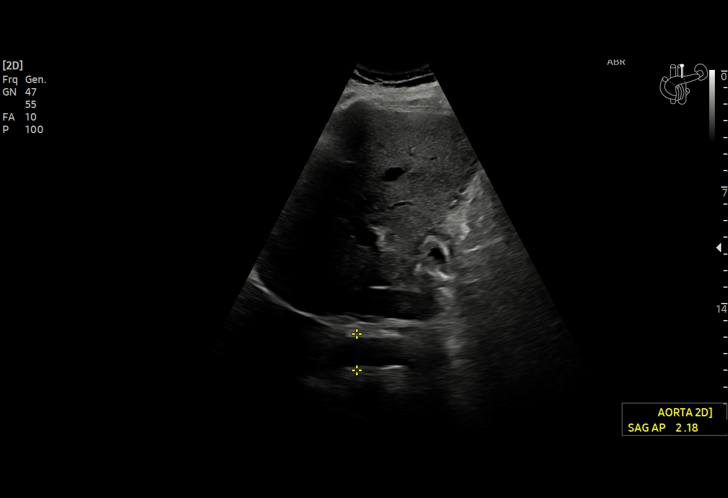
[im 65/71]
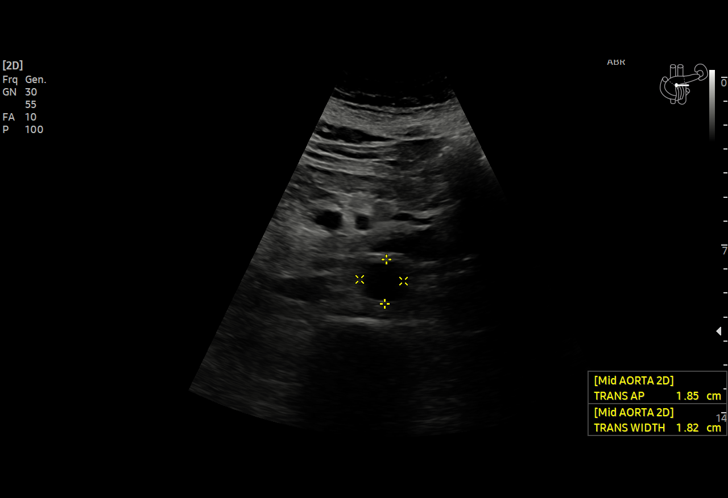
[im 71/71]
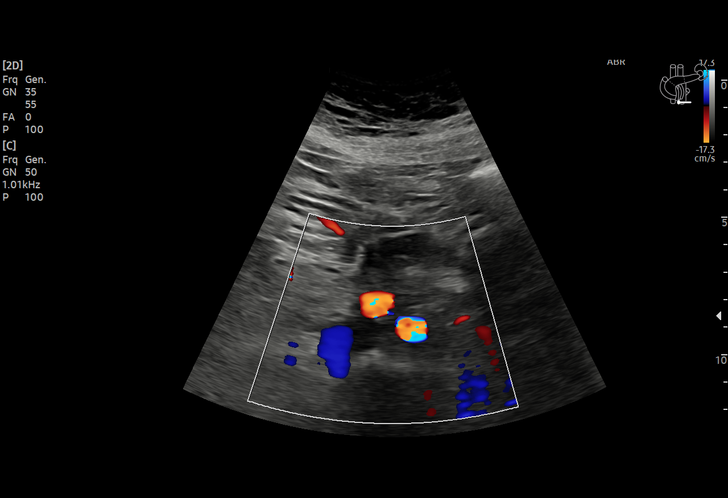

[15 of 25 positions shown; findings below may reference images not displayed]

FINDINGS: Gallbladder: Surgically absent.

Common bile duct: Diameter: 7 mm.

Liver: No focal lesion identified. Mildly increased hepatic
parenchymal echogenicity. Portal vein is patent on color Doppler
imaging with normal direction of blood flow towards the liver.

IVC: No abnormality visualized.

Pancreas: Visualized portion unremarkable.

Spleen: Size and appearance within normal limits.

Right Kidney: Length: 12.0 cm. Echogenicity within normal limits. No
mass or hydronephrosis visualized.

Left Kidney: Length: 11.0 cm. Echogenicity within normal limits. No
mass or hydronephrosis visualized.

Abdominal aorta: No aneurysm visualized.

Other findings: None.
IMPRESSION: 1. The echogenicity of the liver is mildly increased. This is a
nonspecific finding but is most commonly seen with fatty
infiltration of the liver. There are no obvious focal liver lesions.
2. Prior cholecystectomy.
3. Otherwise unremarkable ultrasound of the abdomen.

## 2022-01-02 MED ORDER — FLUCONAZOLE 150 MG PO TABS
150.0000 mg | ORAL_TABLET | ORAL | 0 refills | Status: DC
  Filled 2022-01-02: qty 2, 4d supply, fill #0

## 2022-01-02 NOTE — Telephone Encounter (Signed)
S/W PT - scheduled for 2:15 today ?

## 2022-01-02 NOTE — Addendum Note (Signed)
Addended by: Leeanne Rio on: 01/02/2022 04:40 PM ? ? Modules accepted: Orders ? ?

## 2022-01-02 NOTE — Progress Notes (Signed)
Ultrasound looks okay, suspect fatty liver per radiologist.  ?Labs scheduled please proceed with those.  ?Prior cholecystomy.  ?

## 2022-01-02 NOTE — Addendum Note (Signed)
Addended by: Leeanne Rio on: 01/02/2022 05:03 PM ? ? Modules accepted: Orders ? ?

## 2022-01-03 ENCOUNTER — Other Ambulatory Visit (INDEPENDENT_AMBULATORY_CARE_PROVIDER_SITE_OTHER): Payer: 59

## 2022-01-03 ENCOUNTER — Telehealth: Payer: Self-pay | Admitting: *Deleted

## 2022-01-03 DIAGNOSIS — D72829 Elevated white blood cell count, unspecified: Secondary | ICD-10-CM

## 2022-01-03 DIAGNOSIS — R1011 Right upper quadrant pain: Secondary | ICD-10-CM

## 2022-01-03 DIAGNOSIS — R109 Unspecified abdominal pain: Secondary | ICD-10-CM

## 2022-01-03 LAB — CBC WITH DIFFERENTIAL/PLATELET
Basophils Absolute: 0.1 10*3/uL (ref 0.0–0.1)
Basophils Relative: 0.7 % (ref 0.0–3.0)
Eosinophils Absolute: 0.2 10*3/uL (ref 0.0–0.7)
Eosinophils Relative: 1.2 % (ref 0.0–5.0)
HCT: 42.7 % (ref 36.0–46.0)
Hemoglobin: 14.4 g/dL (ref 12.0–15.0)
Lymphocytes Relative: 33 % (ref 12.0–46.0)
Lymphs Abs: 6.1 10*3/uL — ABNORMAL HIGH (ref 0.7–4.0)
MCHC: 33.7 g/dL (ref 30.0–36.0)
MCV: 93.7 fl (ref 78.0–100.0)
Monocytes Absolute: 0.8 10*3/uL (ref 0.1–1.0)
Monocytes Relative: 4.2 % (ref 3.0–12.0)
Neutro Abs: 11.2 10*3/uL — ABNORMAL HIGH (ref 1.4–7.7)
Neutrophils Relative %: 60.9 % (ref 43.0–77.0)
Platelets: 398 10*3/uL (ref 150.0–400.0)
RBC: 4.56 Mil/uL (ref 3.87–5.11)
RDW: 13.5 % (ref 11.5–15.5)
WBC: 18.4 10*3/uL (ref 4.0–10.5)

## 2022-01-03 LAB — COMPREHENSIVE METABOLIC PANEL
ALT: 54 U/L — ABNORMAL HIGH (ref 0–35)
AST: 28 U/L (ref 0–37)
Albumin: 4.2 g/dL (ref 3.5–5.2)
Alkaline Phosphatase: 81 U/L (ref 39–117)
BUN: 13 mg/dL (ref 6–23)
CO2: 26 mEq/L (ref 19–32)
Calcium: 9.3 mg/dL (ref 8.4–10.5)
Chloride: 95 mEq/L — ABNORMAL LOW (ref 96–112)
Creatinine, Ser: 0.95 mg/dL (ref 0.40–1.20)
GFR: 69.97 mL/min (ref >60.00)
Glucose, Bld: 167 mg/dL — ABNORMAL HIGH (ref 70–99)
Potassium: 4.6 mEq/L (ref 3.5–5.1)
Sodium: 134 mEq/L — ABNORMAL LOW (ref 135–145)
Total Bilirubin: 0.4 mg/dL (ref 0.2–1.2)
Total Protein: 6.7 g/dL (ref 6.0–8.3)

## 2022-01-03 LAB — AMYLASE: Amylase: 33 U/L (ref 27–131)

## 2022-01-03 LAB — SEDIMENTATION RATE: Sed Rate: 26 mm/hr (ref 0–30)

## 2022-01-03 LAB — CK TOTAL AND CKMB (NOT AT ARMC)
CK, MB: <0.7 ng/mL (ref 0–5.0)
Total CK: 30 U/L (ref 29–143)

## 2022-01-03 LAB — LIPASE: Lipase: 46 U/L (ref 11.0–59.0)

## 2022-01-03 NOTE — Telephone Encounter (Signed)
I called and LVM for the patient to call back. Yesmin Mutch,cma   

## 2022-01-03 NOTE — Telephone Encounter (Signed)
Please let the patient know that her WBC remains elevated. Is she still having any respiratory symptoms? Does she have any other symptoms? When was the last day of her prior steroid treatment? ?

## 2022-01-03 NOTE — Telephone Encounter (Signed)
CRITICAL VALUE STICKER ? ?CRITICAL VALUE: WBC-18.4 ? ?RECEIVER (on-site recipient of call): Jari Favre, CMA ? ?DATE & TIME NOTIFIED: 01/03/22 @ 11:46am ? ?MESSENGER (representative from lab): Santiago Glad [facility] lab) ? ?MD NOTIFIED: Dr. Caryl Bis in absence of Flinchum ? ?TIME OF NOTIFICATION: 11:48am ? ?RESPONSE: (see previous labs & notes) ? ?

## 2022-01-06 ENCOUNTER — Institutional Professional Consult (permissible substitution): Payer: 59 | Admitting: Pulmonary Disease

## 2022-01-06 ENCOUNTER — Other Ambulatory Visit: Payer: Self-pay | Admitting: Adult Health

## 2022-01-06 ENCOUNTER — Other Ambulatory Visit: Payer: Self-pay

## 2022-01-06 DIAGNOSIS — R109 Unspecified abdominal pain: Secondary | ICD-10-CM

## 2022-01-06 DIAGNOSIS — D72829 Elevated white blood cell count, unspecified: Secondary | ICD-10-CM

## 2022-01-06 NOTE — Progress Notes (Signed)
Hemoglobin A1C not controlled, however she has been on prednisone x 2 so will not adjust medication at this time. Diet/ exercise advised.  ?CKMB and CK negative.  ?Troponin is negative.  ?Vitamin D low end normal- vitamin D 3 at 4,000 IU international units once daily is advised, have rechecked vitamin d in  3- 4 months.  ?TSH thyroid within normal limits.  ?

## 2022-01-06 NOTE — Progress Notes (Signed)
Patient was already notified regarding CBC/ white count.  ?DO advise to schedule a follow up with hematology as well asap.  ?White count still elevated, trending down, no steroids in over one week. No reports of new symptoms.  ?Sodium slightly low, electrolyte drink advised. Lets repeat CMP on 01/07/2022 with other labs. ALT trending down.  ?Amylase, lipase within normal limits.  ?Sed rate within normal limits.  ?Urine culture pending.

## 2022-01-06 NOTE — Telephone Encounter (Signed)
Pt advised - scheduled for tomorrow 11:15 for rpt cbc - will also re-order missing labs from previous lab visit. ?

## 2022-01-06 NOTE — Progress Notes (Signed)
Orders Placed This Encounter  ?Procedures  ? Comprehensive metabolic panel  ?  Standing Status:   Future  ?  Standing Expiration Date:   01/07/2023  ?  ?

## 2022-01-07 ENCOUNTER — Other Ambulatory Visit (INDEPENDENT_AMBULATORY_CARE_PROVIDER_SITE_OTHER): Payer: 59

## 2022-01-07 ENCOUNTER — Ambulatory Visit (INDEPENDENT_AMBULATORY_CARE_PROVIDER_SITE_OTHER): Payer: 59 | Admitting: Surgery

## 2022-01-07 ENCOUNTER — Encounter: Payer: Self-pay | Admitting: Surgery

## 2022-01-07 ENCOUNTER — Other Ambulatory Visit: Payer: Self-pay | Admitting: Adult Health

## 2022-01-07 ENCOUNTER — Telehealth: Payer: Self-pay

## 2022-01-07 ENCOUNTER — Other Ambulatory Visit: Payer: Self-pay

## 2022-01-07 ENCOUNTER — Ambulatory Visit (INDEPENDENT_AMBULATORY_CARE_PROVIDER_SITE_OTHER): Payer: 59

## 2022-01-07 ENCOUNTER — Other Ambulatory Visit: Payer: Self-pay | Admitting: *Deleted

## 2022-01-07 VITALS — BP 117/73 | HR 105 | Temp 98.1°F | Ht 64.0 in | Wt 180.2 lb

## 2022-01-07 DIAGNOSIS — R229 Localized swelling, mass and lump, unspecified: Secondary | ICD-10-CM | POA: Diagnosis not present

## 2022-01-07 DIAGNOSIS — R051 Acute cough: Secondary | ICD-10-CM

## 2022-01-07 DIAGNOSIS — D72820 Lymphocytosis (symptomatic): Secondary | ICD-10-CM

## 2022-01-07 DIAGNOSIS — R109 Unspecified abdominal pain: Secondary | ICD-10-CM

## 2022-01-07 DIAGNOSIS — R1011 Right upper quadrant pain: Secondary | ICD-10-CM

## 2022-01-07 DIAGNOSIS — D72829 Elevated white blood cell count, unspecified: Secondary | ICD-10-CM

## 2022-01-07 DIAGNOSIS — J189 Pneumonia, unspecified organism: Secondary | ICD-10-CM

## 2022-01-07 LAB — COMPREHENSIVE METABOLIC PANEL
ALT: 27 U/L (ref 0–35)
AST: 18 U/L (ref 0–37)
Albumin: 4.1 g/dL (ref 3.5–5.2)
Alkaline Phosphatase: 88 U/L (ref 39–117)
BUN: 8 mg/dL (ref 6–23)
CO2: 27 mEq/L (ref 19–32)
Calcium: 9.4 mg/dL (ref 8.4–10.5)
Chloride: 99 mEq/L (ref 96–112)
Creatinine, Ser: 0.91 mg/dL (ref 0.40–1.20)
GFR: 73.67 mL/min (ref >60.00)
Glucose, Bld: 227 mg/dL — ABNORMAL HIGH (ref 70–99)
Potassium: 3.9 mEq/L (ref 3.5–5.1)
Sodium: 136 mEq/L (ref 135–145)
Total Bilirubin: 0.4 mg/dL (ref 0.2–1.2)
Total Protein: 6.8 g/dL (ref 6.0–8.3)

## 2022-01-07 LAB — CBC WITH DIFFERENTIAL/PLATELET
Basophils Absolute: 0.1 10*3/uL (ref 0.0–0.1)
Basophils Relative: 0.5 % (ref 0.0–3.0)
Eosinophils Absolute: 0.2 10*3/uL (ref 0.0–0.7)
Eosinophils Relative: 1.5 % (ref 0.0–5.0)
HCT: 41.6 % (ref 36.0–46.0)
Hemoglobin: 13.9 g/dL (ref 12.0–15.0)
Lymphocytes Relative: 41.7 % (ref 12.0–46.0)
Lymphs Abs: 5 10*3/uL — ABNORMAL HIGH (ref 0.7–4.0)
MCHC: 33.5 g/dL (ref 30.0–36.0)
MCV: 93.8 fl (ref 78.0–100.0)
Monocytes Absolute: 0.5 10*3/uL (ref 0.1–1.0)
Monocytes Relative: 4.4 % (ref 3.0–12.0)
Neutro Abs: 6.3 10*3/uL (ref 1.4–7.7)
Neutrophils Relative %: 51.9 % (ref 43.0–77.0)
Platelets: 377 10*3/uL (ref 150.0–400.0)
RBC: 4.44 Mil/uL (ref 3.87–5.11)
RDW: 13.4 % (ref 11.5–15.5)
WBC: 12.1 10*3/uL — ABNORMAL HIGH (ref 4.0–10.5)

## 2022-01-07 MED ORDER — LEVOFLOXACIN 750 MG PO TABS
750.0000 mg | ORAL_TABLET | Freq: Every day | ORAL | 0 refills | Status: AC
  Filled 2022-01-07: qty 7, 7d supply, fill #0

## 2022-01-07 NOTE — Progress Notes (Signed)
New opacity of right lung, I am going to send in Levaquin antibiotic to start. If any symptoms worsen be seen immediately and also lets change Thursday to an in person.  ?Side effects with Levaquin can be tendon rupture and c - difficille - do yogurt and or probiotics while on antibiotic. ?Labs pending.

## 2022-01-07 NOTE — Patient Instructions (Signed)
If you have any concerns or questions, please feel free to call our office. Follow up as needed.  ? ?Lipoma ?A lipoma is a noncancerous (benign) tumor that is made up of fat cells. This is a very common type of soft-tissue growth. Lipomas are usually found under the skin (subcutaneous). They may occur in any tissue of the body that contains fat. Common areas for lipomas to appear include the back, arms, shoulders, buttocks, and thighs. ?Lipomas grow slowly, and they are usually painless. Most lipomas do not cause problems and do not require treatment. ?What are the causes? ?The cause of this condition is not known. ?What increases the risk? ?You are more likely to develop this condition if: ?You are 13-24 years old. ?You have a family history of lipomas. ?What are the signs or symptoms? ?A lipoma usually appears as a small, round bump under the skin. In most cases, the lump will: ?Feel soft or rubbery. ?Not cause pain or other symptoms. ?However, if a lipoma is located in an area where it pushes on nerves, it can become painful or cause other symptoms. ?How is this diagnosed? ?A lipoma can usually be diagnosed with a physical exam. You may also have tests to confirm the diagnosis and to rule out other conditions. Tests may include: ?Imaging tests, such as a CT scan or an MRI. ?Removal of a tissue sample to be looked at under a microscope (biopsy). ?How is this treated? ?Treatment for this condition depends on the size of the lipoma and whether it is causing any symptoms. ?For small lipomas that are not causing problems, no treatment is needed. ?If a lipoma is bigger or it causes problems, surgery may be done to remove the lipoma. Lipomas can also be removed to improve appearance. Most often, the procedure is done after applying a medicine that numbs the area (local anesthetic). ?Liposuction may be done to reduce the size of the lipoma before it is removed through surgery, or it may be done to remove the lipoma.  Lipomas are removed with this method in order to limit incision size and scarring. A liposuction tube is inserted through a small incision into the lipoma, and the contents of the lipoma are removed through the tube with suction. ?Follow these instructions at home: ?Watch your lipoma for any changes. ?Keep all follow-up visits as told by your health care provider. This is important. ?Contact a health care provider if: ?Your lipoma becomes larger or hard. ?Your lipoma becomes painful, red, or increasingly swollen. These could be signs of infection or a more serious condition. ?Get help right away if: ?You develop tingling or numbness in an area near the lipoma. This could indicate that the lipoma is causing nerve damage. ?Summary ?A lipoma is a noncancerous tumor that is made up of fat cells. ?Most lipomas do not cause problems and do not require treatment. ?If a lipoma is bigger or it causes problems, surgery may be done to remove the lipoma. ?Contact a health care provider if your lipoma becomes larger or hard, or if it becomes painful, red, or increasingly swollen. Pain, redness, and swelling could be signs of infection or a more serious condition. ?This information is not intended to replace advice given to you by your health care provider. Make sure you discuss any questions you have with your health care provider. ?Document Revised: 05/30/2019 Document Reviewed: 05/30/2019 ?Elsevier Patient Education ? [place]. ? ?

## 2022-01-07 NOTE — Progress Notes (Signed)
Surgical Clinic Progress/Follow-up Note  ? ?HPI:  ?51 y.o. Female presents to clinic for left infrascapular lipoma follow-up, believe its been about 3 months since the last evaluation. Patient reports no worsening, no change of the area.  She denies fever/chills, CP, or SOB. ? ?Review of Systems:  ?Constitutional: denies fever/chills  ?ENT: denies sore throat, hearing problems  ?Respiratory: denies shortness of breath, wheezing  ?Cardiovascular: denies chest pain, palpitations  ?Gastrointestinal: denies abdominal pain, N/V, or diarrhea/and bowel function as per interval history ?Skin: Denies any other rashes or skin discolorations as per interval history ? ?Vital Signs:  ?BP 117/73   Pulse (!) 105   Temp 98.1 ?F (36.7 ?C) (Oral)   Ht '5\' 4"'$  (1.626 m)   Wt 180 lb 3.2 oz (81.7 kg)   SpO2 94%   BMI 30.93 kg/m?   ? ?Physical Exam:  ?Constitutional:  ?-- Normal body habitus  ?-- Awake, alert, and oriented x3  ?Musculoskeletal / Integumentary: There remains a rather subtle flat subcutaneous, lipoma on the left supra scapular area.  Almost consistent with a knot and the underlying musculature.  Not troublesome, no change in size, no particular troubling pain or remarkable tenderness present.  The knot is flat & does not stand up from the soft tissues, approximately 2 cm in diameter. ?-- Wounds or skin discoloration: None appreciated. ?-- Extremities: B/L UE and LE FROM, hands and feet warm, no edema  ? ? ?Imaging: No new pertinent imaging available for review ? ? ?Assessment:  ?51 y.o. yo Female with a problem list including...  ?Patient Active Problem List  ? Diagnosis Date Noted  ? Sore throat 01/01/2022  ? Acute cough 01/01/2022  ? Bacterial upper respiratory infection 01/01/2022  ? Nausea 11/21/2021  ? Situational anxiety 11/21/2021  ? Vitamin D deficiency 11/21/2021  ? Pain of right thumb 11/21/2021  ? Back pain of lumbar region with sciatica 11/21/2021  ? History of lumbar surgery 11/21/2021  ? Hypothyroidism  11/21/2021  ? Open wound of right index finger due to cat bite 11/02/2021  ? Carpal tunnel syndrome, bilateral 11/02/2021  ? Tick bite of right lower leg 09/09/2021  ? Skin lumps- left upper back  09/09/2021  ? Numbness of fingers of both hands 08/20/2021  ? Bipolar disorder, currently in remission ([place]) 07/21/2021  ? Controlled diabetes mellitus type 2 with complications ([place]) 24/40/1027  ? Gastroesophageal reflux disease 03/06/2021  ? Weight loss counseling, encounter for 02/04/2021  ? Leukocytosis 01/21/2021  ? Insomnia 01/21/2021  ? Fatigue 01/21/2021  ? Chronic midline low back pain 08/05/2020  ? Overweight (BMI 25.0-29.9) 08/05/2020  ? Injury of back 08/05/2020  ?  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 ? ?Plan:  ?            - return to clinic as needed, instructed to call office if any questions or concerns ? ?All of the above recommendations were discussed with the patient and patient's family, and all of patient's and family's questions were answered to their expressed satisfaction. ? ?These notes generated with voice recognition software. I apologize for typographical errors. ? ?Ronny Bacon, MD, FACS ?[place]: [facility] ?General Surgery - Partnering for exceptional care. ?Office: [phone]  ?

## 2022-01-07 NOTE — Progress Notes (Signed)
Orders for CBC/ D and flow cytometry ?

## 2022-01-07 NOTE — Progress Notes (Signed)
No orders of the defined types were placed in this encounter. ?  ?Meds ordered this encounter  ?Medications  ? levofloxacin (LEVAQUIN) 750 MG tablet  ?  Sig: Take 1 tablet (750 mg total) by mouth daily for 7 days.  ?  Dispense:  7 tablet  ?  Refill:  0  ? Pneumonia of right lung due to infectious organism, unspecified part of lung - Plan: levofloxacin (LEVAQUIN) 750 MG tablet ?Abnormal chest x ray.  ?

## 2022-01-07 NOTE — Progress Notes (Signed)
Orders Placed This Encounter  ?Procedures  ? DG Chest 2 View  ?  Order Specific Question:   Reason for Exam (SYMPTOM  OR DIAGNOSIS REQUIRED)  ?  Answer:   cough rule out pneumonia  ?  Order Specific Question:   Preferred imaging location?  ?  Answer:   [facility]  ?  ?

## 2022-01-07 NOTE — Telephone Encounter (Signed)
-----   Message from Doreen Beam, [place] sent at 01/07/2022 12:18 PM EDT ----- ?New opacity of right lung, I am going to send in Levaquin antibiotic to start. If any symptoms worsen be seen immediately and also lets change Thursday to an in person.  ?Side effects with Levaquin can be tendon rupture and c - difficille - do yogurt and or probiotics while on antibiotic. ?Labs pending.  ?

## 2022-01-08 ENCOUNTER — Encounter: Payer: Self-pay | Admitting: Adult Health

## 2022-01-08 LAB — ACUTE VIRAL HEPATITIS (HAV, HBV, HCV)
HCV Ab: NONREACTIVE
Hep A IgM: NEGATIVE
Hep B C IgM: NEGATIVE
Hepatitis B Surface Ag: NEGATIVE

## 2022-01-08 LAB — HCV INTERPRETATION

## 2022-01-08 NOTE — Progress Notes (Signed)
CMP  is within normal limits other than glucose is elevated.  ?WBC is trending down.  ? ?Recheck again CBC and CMP  in one week please order and schedule, around 01/14/22 so I will have it back before my last day 01/16/22. ? ?Hepatitis C negative  ?Acute hepatitis panel A, B, C is negative as well.  ?Bilirubin within normal limits.  ?HIV, quantiferon and lactic acid still pending.  ?Follow up if worsening at anytime.

## 2022-01-08 NOTE — Progress Notes (Signed)
Kathleen Carr I sent patient a Mychart message - please add labs lactic acid, CBC and CMET to be done/ scheduled on 01/10/2022 and I will look for results. Thank you.  ? ?Kathleen Carr,  ?Your lactic acid is elevated, I want you to hold your metformin - so stop it. I will see you in the office in the morning and we will need to repeat your lactic acid, cbc and CMP on Friday 01/10/2022 with lab appointment.  ?When is your follow up with hematology ?  ?How are you feeling ?  ? ?Thank you, ? ?Shawnika Pepin MSN, AGNP-C, FNP-C  ?Family Nurse Practitioner  ?Adult Geriatric Nurse Practitioner  ? ?

## 2022-01-08 NOTE — Progress Notes (Signed)
HIV is non reactive.  ?

## 2022-01-09 ENCOUNTER — Other Ambulatory Visit: Payer: Self-pay

## 2022-01-09 ENCOUNTER — Encounter: Payer: Self-pay | Admitting: Adult Health

## 2022-01-09 ENCOUNTER — Telehealth: Payer: Self-pay

## 2022-01-09 ENCOUNTER — Ambulatory Visit (INDEPENDENT_AMBULATORY_CARE_PROVIDER_SITE_OTHER): Payer: 59 | Admitting: Adult Health

## 2022-01-09 VITALS — BP 138/72 | HR 80 | Temp 97.9°F | Ht 64.0 in | Wt 180.2 lb

## 2022-01-09 DIAGNOSIS — R5383 Other fatigue: Secondary | ICD-10-CM

## 2022-01-09 DIAGNOSIS — R1011 Right upper quadrant pain: Secondary | ICD-10-CM

## 2022-01-09 DIAGNOSIS — R7989 Other specified abnormal findings of blood chemistry: Secondary | ICD-10-CM

## 2022-01-09 DIAGNOSIS — R109 Unspecified abdominal pain: Secondary | ICD-10-CM

## 2022-01-09 DIAGNOSIS — J90 Pleural effusion, not elsewhere classified: Secondary | ICD-10-CM | POA: Insufficient documentation

## 2022-01-09 DIAGNOSIS — R1031 Right lower quadrant pain: Secondary | ICD-10-CM | POA: Insufficient documentation

## 2022-01-09 DIAGNOSIS — J189 Pneumonia, unspecified organism: Secondary | ICD-10-CM | POA: Diagnosis not present

## 2022-01-09 DIAGNOSIS — R051 Acute cough: Secondary | ICD-10-CM | POA: Diagnosis not present

## 2022-01-09 DIAGNOSIS — R062 Wheezing: Secondary | ICD-10-CM | POA: Insufficient documentation

## 2022-01-09 DIAGNOSIS — D72829 Elevated white blood cell count, unspecified: Secondary | ICD-10-CM

## 2022-01-09 MED ORDER — PROMETHAZINE-DM 6.25-15 MG/5ML PO SYRP
5.0000 mL | ORAL_SOLUTION | Freq: Every evening | ORAL | 1 refills | Status: DC | PRN
  Filled 2022-01-09: qty 118, 23d supply, fill #0

## 2022-01-09 NOTE — Patient Instructions (Addendum)
Kathleen Carr. Kathleen Dessert, MD ?Address: [address] [place], [place], [place] [address] ?Hours:  ?Open ? Closes 5?PM ?Phone: [phone] ? ? ?Lactic Acid Test ?Why am I having this test? ?The lactic acid test helps determine how much oxygen your body tissues are getting. Sugar (glucose) is used by your body for energy. When the oxygen supply to tissues is normal, glucose is broken down (metabolized) into carbon dioxide and water. However, when the oxygen supply is lower than normal, lactic acid (lactate) is created instead. Anything that decreases your ability to get oxygen to your cells can increase lactate levels. ?Because this test determines the amount of decreased oxygen supply to cells (hypoxia), it may be done to evaluate the level of oxygen in your tissues if you have or recently had any of these conditions: ?Seizures. ?Low blood pressure with poor blood supply to your organs (shock). ?A severe infection called sepsis. ?A blocked blood vessel. ?The test may also be used to monitor treatment for these conditions or others that can cause tissue hypoxia. ?What is being tested? ?This test measures the amount of lactic acid in your blood. ?What kind of sample is taken? ?A blood sample is required for this test. It is usually collected by inserting a needle into a blood vessel. ?Tell a health care provider about: ?All medicines you are taking, including vitamins, herbs, eye drops, creams, and over-the-counter medicines. ?How are the results reported? ?Your test results will be reported as values. Your health care provider will compare your results to normal ranges that were established after testing a large group of people (reference ranges). Reference ranges may vary among labs and hospitals. For this test, common reference ranges are: ?Venous blood: 5-20 mg/dL or 0.6-2.2 mmol/L (SI units). ?Arterial blood: 3-7 mg/dL or 0.3-0.8 mmol/L (SI units). ?What do the results mean? ?Abnormally high lactate levels may  indicate many health conditions. These may include: ?Recent or current shock. ?Recent seizure. ?Tissue hypoxia. ?Lack of blood supply to cells (tissue ischemia). ?Carbon monoxide poisoning. ?Severe liver disease or kidney disease. ?Genetic conditions resulting in abnormal glucose metabolism. ?A complication of diabetes mellitus. ?A severe infection called sepsis. ?Use of certain medicines, such as aspirin or metformin. ?Exposure to toxins such as cyanide and methanol. ?Talk with your health care provider about what your results mean. ?Questions to ask your health care provider ?Ask your health care provider, or the department that is doing the test: ?When will my results be ready? ?How will I get my results? ?What are my treatment options? ?What other tests do I need? ?What are my next steps? ?Summary ?The lactic acid test helps determine how much oxygen your body tissues are getting. When the oxygen supply is lower than normal, lactic acid (lactate) is created. ?The test may be done to evaluate the level of oxygen in your tissues if you have or recently had any condition that can cause a decreased oxygen supply to cells. ?Abnormally high lactate levels may indicate many health conditions. These may include a severe infection (sepsis), a lack of blood supply to cells, a recent seizure, or carbon monoxide poisoning. ?Talk with your health care provider about what your results mean. ?This information is not intended to replace advice given to you by your health care provider. Make sure you discuss any questions you have with your health care provider. ?Document Revised: 05/14/2021 Document Reviewed: 05/14/2021 ?Elsevier Patient Education ? [place]. ? ? ?Leukocytosis ?Leukocytosis means that a person has more white  blood cells than normal. White blood cells are made in the bone marrow. Bone marrow is the spongy tissue inside bones. The main job of white blood cells is to fight infection. ?Having too many  white blood cells is a common condition. It can develop as a result of many types of medical problems. ?What are the causes? ?Leukocytosis may be caused by various conditions. In some cases, the bone marrow is normal but is still making too many white blood cells. This could be the result of: ?Infection. ?Injury. ?Physical stress. ?Emotional stress. ?Surgery. ?Allergic reactions. ?Certain medicines. ?Other causes may include: ?A genetic or inherited disease. ?Chronic inflammatory conditions. ?Tumors that start in other areas of the body, but not in the blood or bone marrow. ?Pregnancy and labor. ?In other cases, a person may have a bone marrow disorder that is causing the body to make too many white blood cells. Bone marrow disorders include: ?Leukemia. This is a type of blood cancer. ?Myeloproliferative disorders. These disorders cause blood cells to grow abnormally. ?What are the signs or symptoms? ?Often, this condition causes no symptoms. Some people may have symptoms due to the medical condition that is causing their leukocytosis. These symptoms may include: ?Bleeding. ?Bruising. ?Fever. ?Night sweats. ?Weakness. ?Weight loss. ?Other symptoms may include: ?An enlarged spleen. ?Swollen lymph nodes. ?Repeated infections. ?How is this diagnosed? ?This condition is diagnosed with blood tests. It is often found when blood is tested as part of a routine physical exam. You may have other tests to help determine why you have too many white blood cells. These tests may include: ?A complete blood count (CBC). This test measures all the types of blood cells in your body. ?Chest X-rays, urine tests, or other tests to look for signs of infection. ?Bone marrow aspiration. For this test, a needle is put into your bone. Cells from the bone marrow are removed through the needle and examined under a microscope. ?Other tests on the blood or bone marrow sample. ?CT scan, bone scan, or other imaging tests. ?How is this  treated? ?Usually, treatment is not needed for leukocytosis. However, if an infection, cancer, bone marrow disorder, or other serious problem is causing your leukocytosis, it will need to be treated. Treatment may include: ?Regular monitoring of your white blood cell count to look for changes. ?Antibiotic medicine if you have a bacterial infection. ?Bone marrow transplant. This treatment replaces your diseased bone marrow with healthy cells that will grow new bone marrow. ?Chemotherapy or biological therapies such as the use of antibodies. These treatments may be used to kill cancer cells or to decrease the number of white blood cells. ?Follow these instructions at home: ?Medicines ?Take over-the-counter and prescription medicines only as told by your health care provider. ?If you were prescribed an antibiotic medicine, take it as told by your health care provider. Do not stop taking the antibiotic even if you start to feel better. ?Eating and drinking ? ?Eat foods that are low in saturated fats and high in fiber. Eat plenty of fruits and vegetables. ?Drink enough fluid to keep your urine pale yellow. ?Limit your intake of caffeine and alcohol. ?General instructions ?Maintain a healthy weight. Ask your health care provider what weight is best for you. ?Do 30 minutes of exercise at least 5 times each week. Check with your health care provider before you start a new exercise routine. ?Follow any safety precautions as told by your health care provider. This may be needed if your condition causes an  increased risk for infection or bleeding. ?Do not use any products that contain nicotine or tobacco. These products include cigarettes, chewing tobacco, and vaping devices, such as e-cigarettes. If you need help quitting, ask your health care provider. ?Keep all follow-up visits. This is important. ?Contact a health care provider if: ?You feel weak or more tired than usual. ?You develop chills, a cough, or nasal  congestion. ?You have a fever. ?You lose weight without trying. ?You have night sweats. ?You bruise easily. ?You have new or worsening symptoms. ?Get help right away if: ?You bleed more than normal or your bleeding is difficult to

## 2022-01-09 NOTE — Progress Notes (Addendum)
? ?Acute Office Visit ? ?Subjective:  ? ? Patient ID: Kathleen Carr, female    DOB: Nov 05, 1970, 51 y.o.   MRN: [id number] ? ?Chief Complaint  ?Patient presents with  ? Follow-up  ?  1 wk follow up - pt still c/o coughing, body aches, fatigue. Pt also complaining of skin itching.   ? ? ?HPI ?Patient is in today for follow up she has a history of respiratory infection that started 12/17/2021 while she was in [place] on a trip.  She was treated at urgent care with clindamycin Bromfed which she felt was not helping and she was given a prednisone 40 mg p.o. for 3 days.  Testing for COVID flu RSV and strep were negative.  She had fatigue and congested cough at that time.  Augmentin was added to her medication regimen at the office visit that day.  Mononucleosis test was negative.  Urinalysis was unremarkable.  She was first seen in this office on 12/25/2021 for this infection.  Chest x-ray was within normal limits at this office visit on 12/25/2021. ?Denies edema, orthopnea. No longer wheezing. Head congestion has improved/ resolving.  ?She was then sent to the ER after lab work returned by this provider. ?CT angiogram was done after finding leukocytes elevated over 21 and thrombocytopenia.  At the ER at [facility] no evidence of pulmonary embolus, trace right pleural effusion, mildly enlarged bilateral hilar lymph nodes.  Azithromycin was added to medication or treatment.  Clindamycin was discontinued.  Fluconazole 150 mg was given. ? ? ?She was again sent by provider due to persistent elevated white blood cell count to emergency department at [facility] on 12/31/2021 for leukocytosis after she had been off of prednisone over a week.  WBC was 21 and there was cytopenia was present.  Patient was also being treated for thrush at that time.   ? ? ?Questionable colitis was noted on CT scan, trace bilateral effusions were seen on CT of abdomen pelvis 12/31/2021 at [facility] ER visit.she has a history of cholecystectomy.   Rectosigmoid colon showed mild thickening and under distention, however colitis cannot be excluded, patient has no diarrhea or constipation.  Denies any black or tarry stools or blood in stools denies any hemoptysis.  She was referred back to Dr. Marius Ditch a gastroenterologist back Dr. Marius Ditch agreed to see her on last Monday.  Patient has not seen Dr. Marius Ditch yet.   ? ?She is still having tenderness in her right upper quadrant and right mid abdomen.  Less tenderness than previously.  No medication changes were made she was continued on a bland diet. ? ?Ultrasound abdomen was unremarkable on 12/31/2021. ?Chest x-ray on 3 7 was also negative for any acute cardiopulmonary disease. ? ?Urinalysis was unremarkable, HIV negative bilirubin within normal limits, acute viral hepatitis panel negative hepatitis C negative, quant to [place] still in progress. ? ?CBC showed a white blood cell count 2 weeks ago was 22.4, recheck of that was 18.8 at [facility], 9 days ago 21.5, 6 days ago 18.4, and 2 days ago ? ?Patient felt like her cough is resolving, until 01/07/2022 when cough started returning nonproductive no wheezing or distress, chest x-ray was placed with lab orders chest x-ray returned with new small opacity in the right costophrenic angle that could reflect atelectasis or developing infiltrate.  She was then started on Levaquin 750 mg by mouth once daily for 7 days on 01/07/2022, however patient reports she did not start this medication until 01/08/2022. ?12.1 was  the latest white blood cell count.  On 01/07/2022, lactic acid also elevated at 4.1, patient is on metformin metformin is held at this time and we will recheck her lactic acid tomorrow morning.  Patient does not appear toxic.  Discussed this lab with Dr. Derrel Nip who is in agreement with plan. Has had some sweating at night.  ? ? ?She has been appointment with Dr. Patsey Berthold 01/28/2022. ? ?She was previously referred to hematology 09/09/2021 Dr. Janese Banks oncology for persistent leukocytosis  lymphocytosis that had dated back to October 2021.  That for a cytosis and she did see ER 09/30/2021 by video.  Flow cytometry did not show any immunophenotypic abnormalities.  Multiple lymphocyte subsets elevated suggestive of a reactive process per Dr. Janese Banks.  Patient had complaints of fatigue.  She was to follow-up with Dr. Janese Banks in 6 months, however this provider message Dr. Janese Banks and Dr. Saunders Revel moved up appointment to April for further testing work-up given patient's her active lymphocytosis to this infection and persistent chronic need to rule out lymphoma leukemia. ? ? ?Denies fever or chills.  ? ?No rash.  ?Patient  denies any fever, chills, rash, chest pain, shortness of breath, nausea, vomiting, or diarrhea.  ? ?Denies dizziness, lightheadedness, pre syncopal or syncopal episodes.  ? ? ? ?Past Medical History:  ?Diagnosis Date  ? Allergy   ? Anxiety   ? Arthritis   ? Asthma   ? Bipolar depression ([place])   ? Depression   ? Diabetes mellitus without complication ([place])   ? GERD (gastroesophageal reflux disease)   ? Hypertension   ? PTSD (post-traumatic stress disorder)   ? PTSD (post-traumatic stress disorder)   ? Thyroid disease   ? ? ?Past Surgical History:  ?Procedure Laterality Date  ? ABDOMINAL HYSTERECTOMY    ? CHOLECYSTECTOMY    ? KNEE ARTHROSCOPY Bilateral   ? TONSILLECTOMY    ? ? ?Family History  ?Problem Relation Age of Onset  ? Hypertension Mother   ? Thyroid disease Mother   ? Cancer Father 14  ?     carcinoid, right lung  ? Ulcerative colitis Father   ? Glaucoma Maternal Grandmother   ? Renal Disease Maternal Grandfather   ? Diabetes Maternal Grandfather   ?     Type2  ? Stroke Paternal Grandfather   ? Hypertension Paternal Grandfather   ? ? ?Social History  ? ?Socioeconomic History  ? Marital status: Married  ?  Spouse name: Not on file  ? Number of children: Not on file  ? Years of education: Not on file  ? Highest education level: Not on file  ?Occupational History  ? Not on file  ?Tobacco Use  ? Smoking  status: Some Days  ?  Types: Cigarettes  ?  Last attempt to quit: 05/25/2020  ?  Years since quitting: 1.6  ? Smokeless tobacco: Never  ?Vaping Use  ? Vaping Use: Some days  ?Substance and Sexual Activity  ? Alcohol use: Not Currently  ? Drug use: Not Currently  ? Sexual activity: Not Currently  ?  Partners: Female  ?  Comment: married to wife  ?Other Topics Concern  ? Not on file  ?Social History Narrative  ? Not on file  ? ?Social Determinants of Health  ? ?Financial Resource Strain: Not on file  ?Food Insecurity: Not on file  ?Transportation Needs: Not on file  ?Physical Activity: Not on file  ?Stress: Not on file  ?Social Connections: Not on file  ?Intimate Partner  Violence: Not on file  ? ? ?Outpatient Medications Prior to Visit  ?Medication Sig Dispense Refill  ? albuterol (PROAIR HFA) 108 (90 Base) MCG/ACT inhaler Inhale 1-2 puffs into the lungs every 6 (six) hours as needed for wheezing. 8.5 g 2  ? ALPRAZolam (XANAX) 0.25 MG tablet Take 1-2 tablets (0.25-0.5 mg total) by mouth 2 (two) times daily as needed for anxiety (will cause drowsiness.). 30 tablet 0  ? cholecalciferol (VITAMIN D3) 25 MCG (1000 UNIT) tablet Take 1,000 Units by mouth daily.    ? cyclobenzaprine (FLEXERIL) 10 MG tablet Take 1 tablet (10 mg total) by mouth 3 (three) times daily as needed for muscle spasms (will cause drowsiness.). 30 tablet 0  ? levofloxacin (LEVAQUIN) 750 MG tablet Take 1 tablet (750 mg total) by mouth daily for 7 days. 7 tablet 0  ? levothyroxine (SYNTHROID) 50 MCG tablet TAKE 1 TABLET BY MOUTH DAILY BEFORE BREAKFAST. 90 tablet 1  ? loratadine (CLARITIN) 10 MG tablet Take 10 mg by mouth daily.    ? metFORMIN (GLUCOPHAGE) 500 MG tablet Take 1 tablet (500 mg total) by mouth 2 (two) times daily with a meal. 180 tablet 3  ? oxybutynin (DITROPAN XL) 10 MG 24 hr tablet Take 1 tablet (10 mg total) by mouth at bedtime. 90 tablet 1  ? pantoprazole (PROTONIX) 40 MG tablet Take 1 tablet (40 mg total) by mouth daily. 90 tablet 1  ?  QUEtiapine (SEROQUEL) 300 MG tablet TAKE 1 TABLET (300 MG TOTAL) BY MOUTH AT BEDTIME. 90 tablet 1  ? rosuvastatin (CRESTOR) 5 MG tablet TAKE 1 TABLET BY MOUTH DAILY. 90 tablet 1  ? sertraline (ZOLOFT) 100 MG table

## 2022-01-09 NOTE — Telephone Encounter (Signed)
Adipex added back to pt med list. ?

## 2022-01-10 ENCOUNTER — Other Ambulatory Visit (INDEPENDENT_AMBULATORY_CARE_PROVIDER_SITE_OTHER): Payer: 59

## 2022-01-10 DIAGNOSIS — D72829 Elevated white blood cell count, unspecified: Secondary | ICD-10-CM | POA: Diagnosis not present

## 2022-01-10 DIAGNOSIS — R051 Acute cough: Secondary | ICD-10-CM | POA: Diagnosis not present

## 2022-01-10 DIAGNOSIS — J189 Pneumonia, unspecified organism: Secondary | ICD-10-CM | POA: Diagnosis not present

## 2022-01-10 DIAGNOSIS — R5383 Other fatigue: Secondary | ICD-10-CM

## 2022-01-10 LAB — CBC WITH DIFFERENTIAL/PLATELET
Basophils Absolute: 0.1 10*3/uL (ref 0.0–0.1)
Basophils Relative: 1.1 % (ref 0.0–3.0)
Eosinophils Absolute: 0.2 10*3/uL (ref 0.0–0.7)
Eosinophils Relative: 2.1 % (ref 0.0–5.0)
HCT: 39.8 % (ref 36.0–46.0)
Hemoglobin: 13.8 g/dL (ref 12.0–15.0)
Lymphocytes Relative: 49.2 % — ABNORMAL HIGH (ref 12.0–46.0)
Lymphs Abs: 5.1 10*3/uL — ABNORMAL HIGH (ref 0.7–4.0)
MCHC: 34.7 g/dL (ref 30.0–36.0)
MCV: 93.3 fl (ref 78.0–100.0)
Monocytes Absolute: 0.7 10*3/uL (ref 0.1–1.0)
Monocytes Relative: 6.3 % (ref 3.0–12.0)
Neutro Abs: 4.3 10*3/uL (ref 1.4–7.7)
Neutrophils Relative %: 41.3 % — ABNORMAL LOW (ref 43.0–77.0)
Platelets: 394 10*3/uL (ref 150.0–400.0)
RBC: 4.27 Mil/uL (ref 3.87–5.11)
RDW: 13.6 % (ref 11.5–15.5)
WBC: 10.4 10*3/uL (ref 4.0–10.5)

## 2022-01-10 LAB — COMPREHENSIVE METABOLIC PANEL
ALT: 20 U/L (ref 0–35)
AST: 16 U/L (ref 0–37)
Albumin: 4 g/dL (ref 3.5–5.2)
Alkaline Phosphatase: 70 U/L (ref 39–117)
BUN: 9 mg/dL (ref 6–23)
CO2: 30 mEq/L (ref 19–32)
Calcium: 9.5 mg/dL (ref 8.4–10.5)
Chloride: 99 mEq/L (ref 96–112)
Creatinine, Ser: 0.93 mg/dL (ref 0.40–1.20)
GFR: 71.77 mL/min (ref >60.00)
Glucose, Bld: 136 mg/dL — ABNORMAL HIGH (ref 70–99)
Potassium: 4 mEq/L (ref 3.5–5.1)
Sodium: 136 mEq/L (ref 135–145)
Total Bilirubin: 0.4 mg/dL (ref 0.2–1.2)
Total Protein: 6.5 g/dL (ref 6.0–8.3)

## 2022-01-11 LAB — QUANTIFERON-TB GOLD PLUS
Mitogen-NIL: >10 IU/mL
NIL: 0.03 IU/mL
QuantiFERON-TB Gold Plus: POSITIVE — AB
TB1-NIL: 0.19 IU/mL
TB2-NIL: 0.46 IU/mL

## 2022-01-11 LAB — BILIRUBIN, FRACTIONATED(TOT/DIR/INDIR)
Bilirubin, Direct: 0.1 mg/dL (ref 0.0–0.2)
Indirect Bilirubin: 0.3 mg/dL (calc) (ref 0.2–1.2)
Total Bilirubin: 0.4 mg/dL (ref 0.2–1.2)

## 2022-01-11 LAB — LACTIC ACID, PLASMA: LACTIC ACID: 4.1 mmol/L — ABNORMAL HIGH (ref 0.4–1.8)

## 2022-01-11 LAB — HIV ANTIBODY (ROUTINE TESTING W REFLEX): HIV 1&2 Ab, 4th Generation: NONREACTIVE

## 2022-01-13 ENCOUNTER — Telehealth: Payer: Self-pay | Admitting: Adult Health

## 2022-01-13 ENCOUNTER — Encounter: Payer: Self-pay | Admitting: Adult Health

## 2022-01-13 ENCOUNTER — Other Ambulatory Visit: Payer: Self-pay | Admitting: Adult Health

## 2022-01-13 ENCOUNTER — Ambulatory Visit
Admission: RE | Admit: 2022-01-13 | Discharge: 2022-01-13 | Disposition: A | Discharge status: Home / Self Care | Payer: 59 | Source: Ambulatory Visit | Attending: Adult Health | Admitting: Adult Health

## 2022-01-13 ENCOUNTER — Ambulatory Visit: Payer: Medicaid Other | Admitting: Surgery

## 2022-01-13 ENCOUNTER — Other Ambulatory Visit: Payer: Self-pay

## 2022-01-13 VITALS — Wt 176.0 lb

## 2022-01-13 DIAGNOSIS — R1031 Right lower quadrant pain: Secondary | ICD-10-CM | POA: Insufficient documentation

## 2022-01-13 DIAGNOSIS — R5383 Other fatigue: Secondary | ICD-10-CM

## 2022-01-13 DIAGNOSIS — D72829 Elevated white blood cell count, unspecified: Secondary | ICD-10-CM

## 2022-01-13 DIAGNOSIS — R109 Unspecified abdominal pain: Secondary | ICD-10-CM | POA: Insufficient documentation

## 2022-01-13 DIAGNOSIS — R7989 Other specified abnormal findings of blood chemistry: Secondary | ICD-10-CM | POA: Insufficient documentation

## 2022-01-13 DIAGNOSIS — R1011 Right upper quadrant pain: Secondary | ICD-10-CM | POA: Diagnosis present

## 2022-01-13 DIAGNOSIS — R7612 Nonspecific reaction to cell mediated immunity measurement of gamma interferon antigen response without active tuberculosis: Secondary | ICD-10-CM | POA: Insufficient documentation

## 2022-01-13 DIAGNOSIS — J189 Pneumonia, unspecified organism: Secondary | ICD-10-CM

## 2022-01-13 LAB — LACTIC ACID, PLASMA: LACTIC ACID: 1 mmol/L (ref 0.4–1.8)

## 2022-01-13 IMAGING — CT CT ABD-PELV W/O CM
2 of 4 series · 16 of 46 positions shown, 18 images · non-contrast
Comparison: None.

CLINICAL DATA: Right flank pain. Right lower quadrant pain.
Leukocytosis.



[Series 2: axial st · axial · 0.86mm/px · z∈[-547,-82]mm · 13 of 103 slices shown, 15 images]
[im 5/103  soft-tissue]
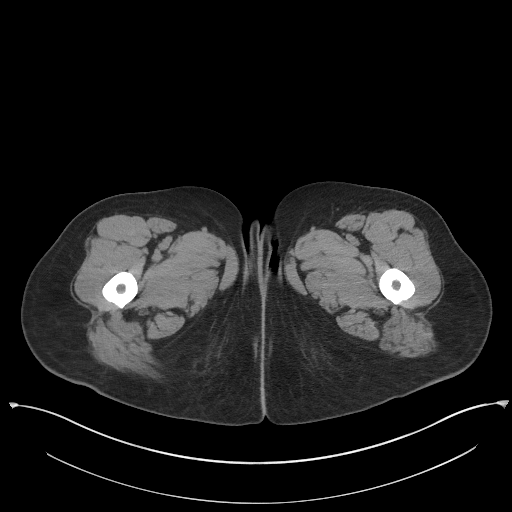
[im 5/103  bone]
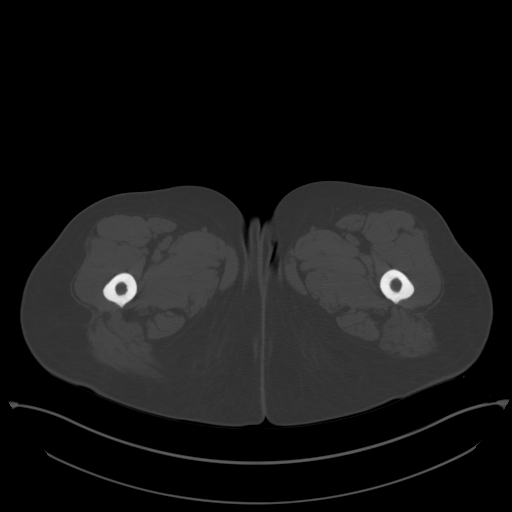
[im 14/103  soft-tissue]
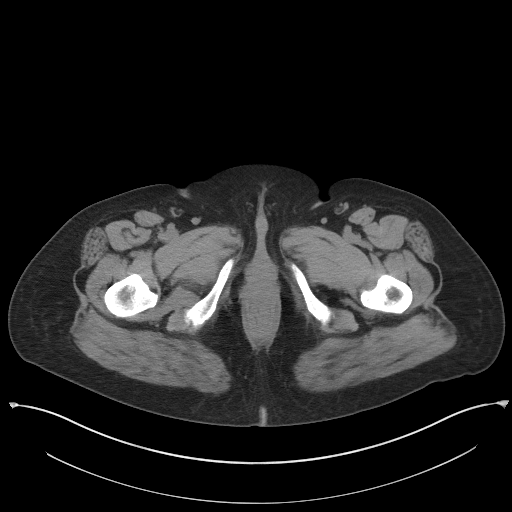
[im 23/103  soft-tissue]
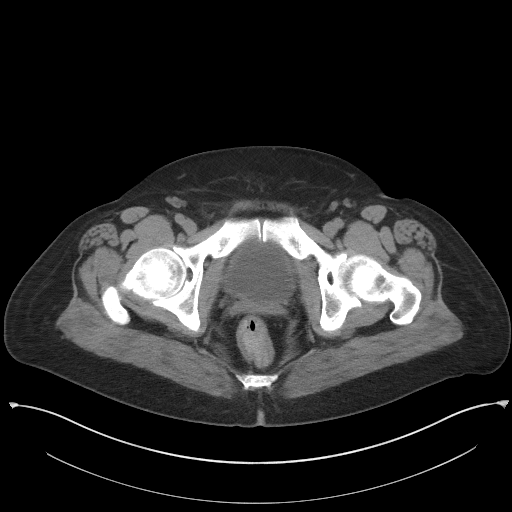
[im 27/103  soft-tissue]
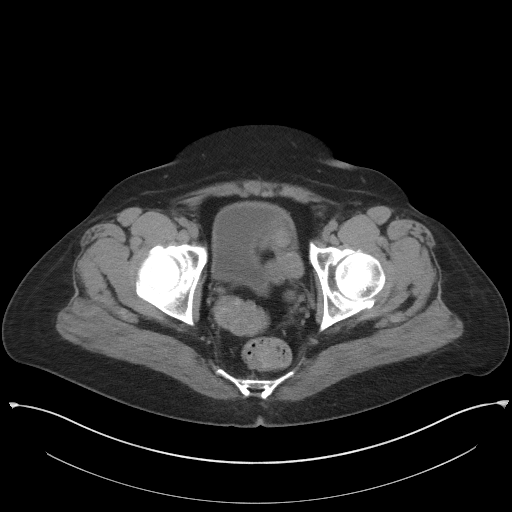
[im 36/103  soft-tissue]
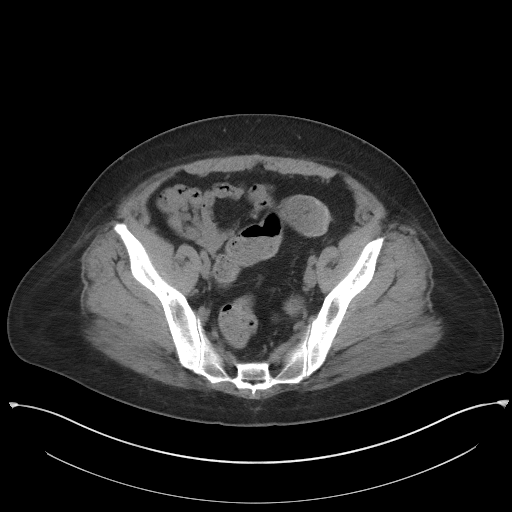
[im 45/103  soft-tissue]
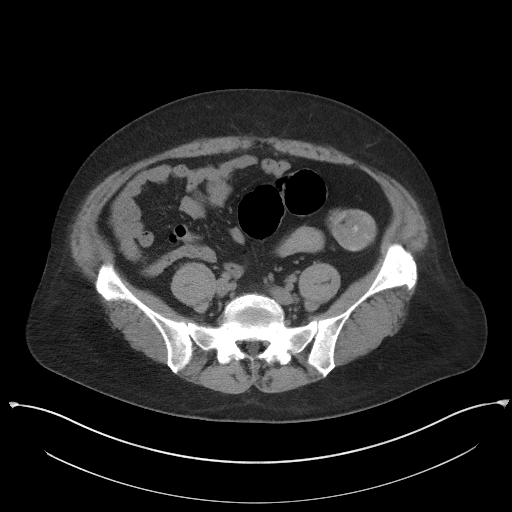
[im 54/103  soft-tissue]
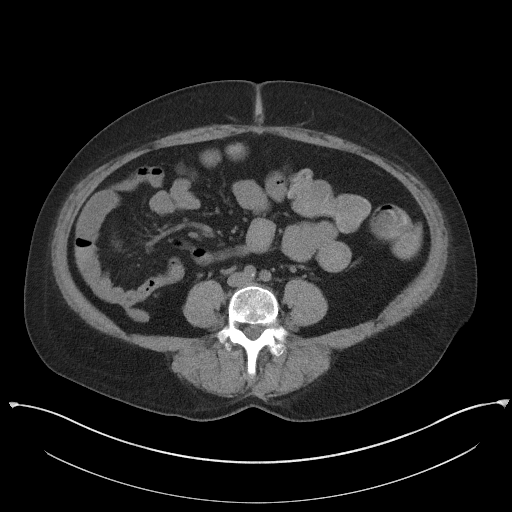
[im 58/103  soft-tissue]
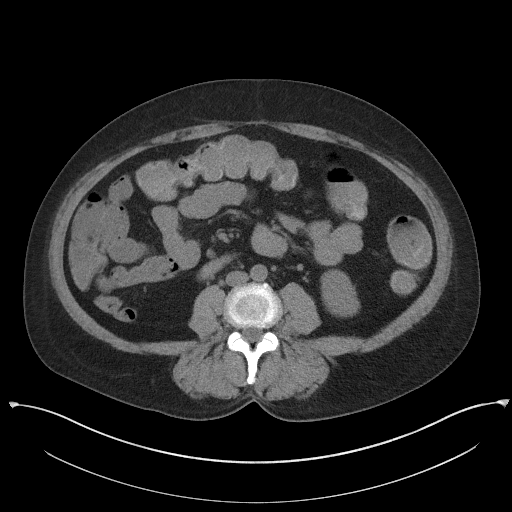
[im 67/103  soft-tissue]
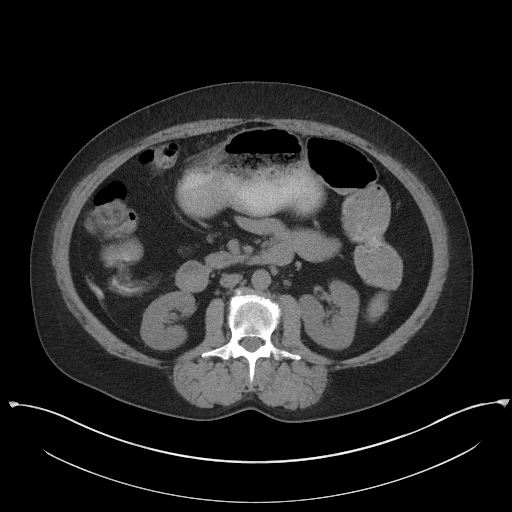
[im 67/103  bone]
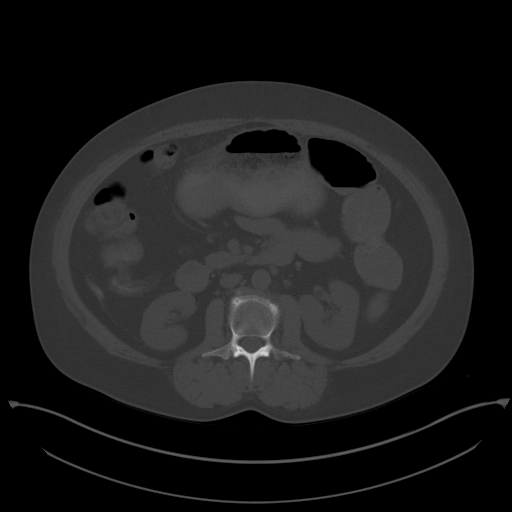
[im 76/103  soft-tissue]
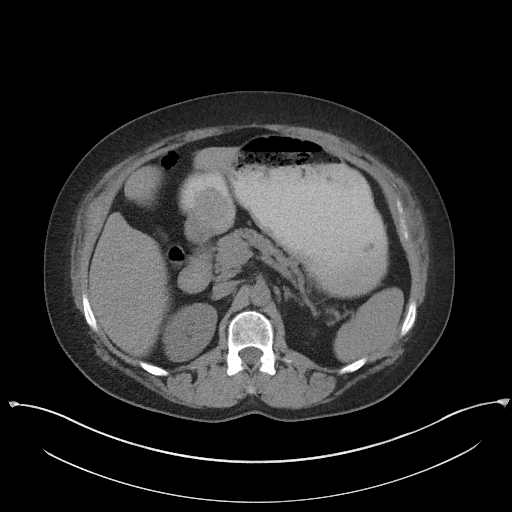
[im 80/103  soft-tissue]
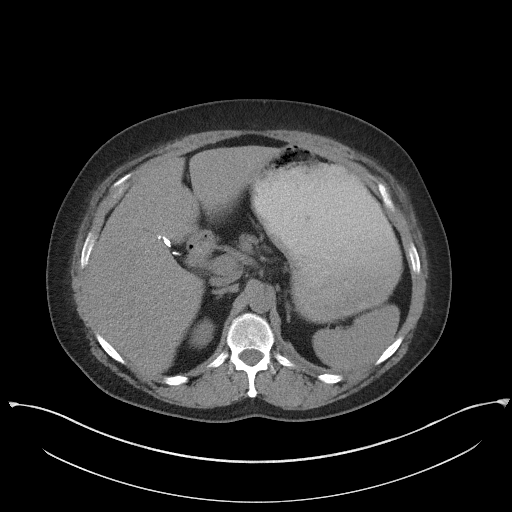
[im 89/103  soft-tissue]
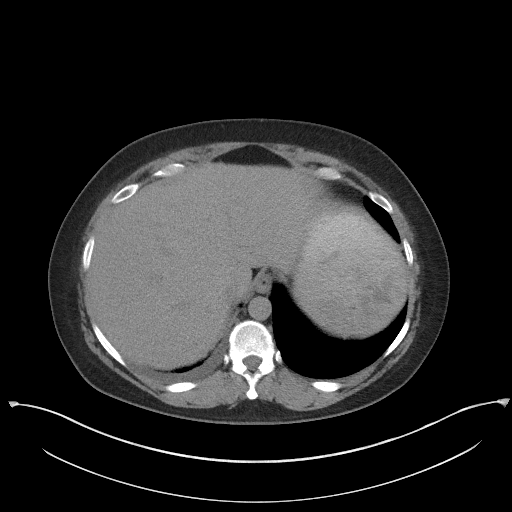
[im 98/103  soft-tissue]
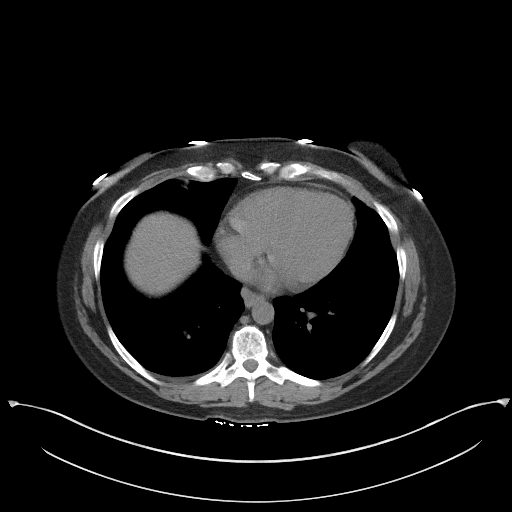

[Series 5: coronal st · coronal · 0.86mm/px · 3 of 101 slices shown]
[im 34/101  soft-tissue]
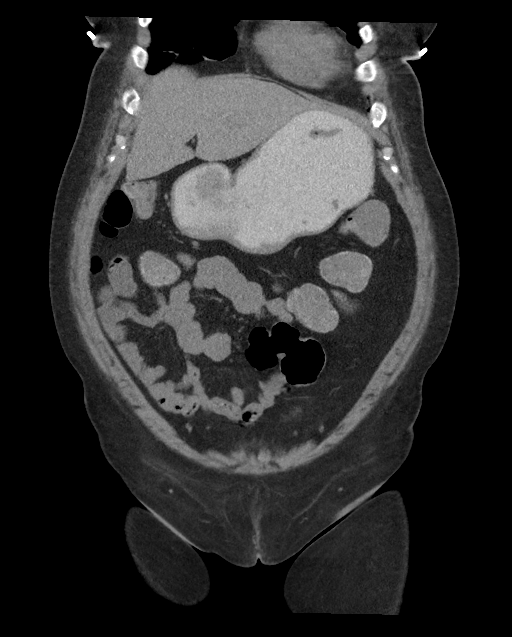
[im 45/101  soft-tissue]
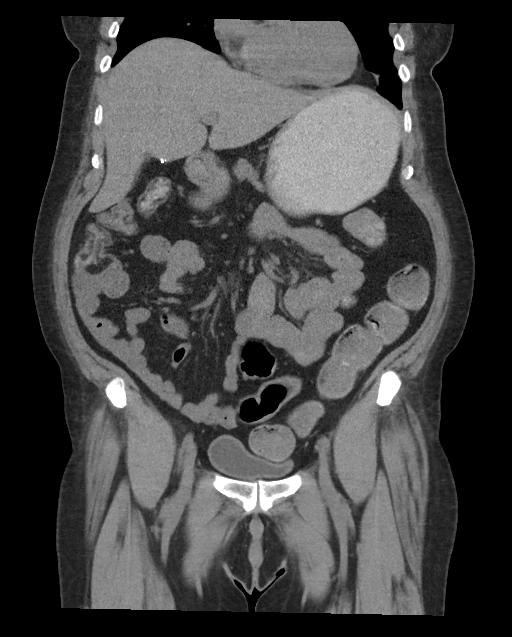
[im 56/101  soft-tissue]
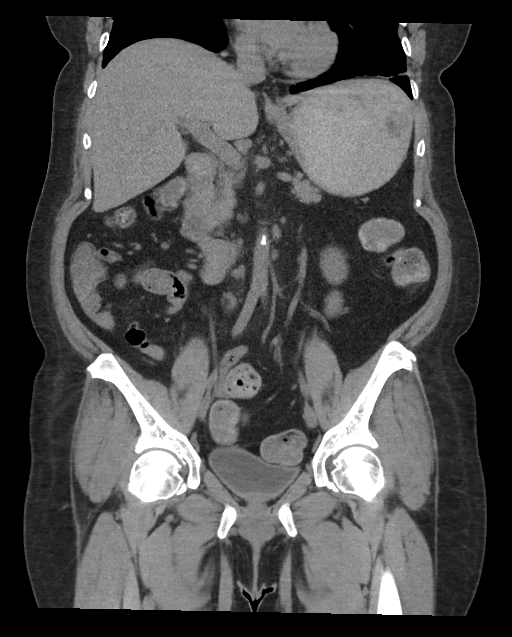

[16 of 46 positions shown; findings below may reference images not displayed]

FINDINGS: Lower chest: Tiny bilateral pleural effusions.

Hepatobiliary: No mass visualized on this unenhanced exam. Prior
cholecystectomy. No evidence of biliary obstruction.

Pancreas: No mass or inflammatory process visualized on this
unenhanced exam.

Spleen:  Within normal limits in size.

Adrenals/Urinary tract: No evidence of urolithiasis or
hydronephrosis. Unremarkable unopacified urinary bladder.

Stomach/Bowel: No evidence of obstruction, inflammatory process, or
abnormal fluid collections. Normal appendix visualized.

Vascular/Lymphatic: No pathologically enlarged lymph nodes
identified. No evidence of abdominal aortic aneurysm. Aortic
atherosclerotic calcification noted.

Reproductive: Prior hysterectomy noted. Adnexal regions are
unremarkable in appearance.

Other:  None.

Musculoskeletal:  No suspicious bone lesions identified.
IMPRESSION: No evidence of appendicitis, urolithiasis, or other acute findings
within the abdomen or pelvis.

Tiny bilateral pleural effusions incidentally noted.

Aortic Atherosclerosis ([RW]-[RW]).

## 2022-01-13 NOTE — Telephone Encounter (Signed)
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 She reports she is feeling better, has fatigue still and also has cough that has improved and is non productive. She is aware she will need health department, infectious disease, and keep pulmonary and hematology appointments scheduled already,provider called patient she verbalized understanding. She is appreciative of results and has no further questions at this time. Enteric precautions given and advised quarantine at home. Her wife will also need testing. Patient verbalized understanding of all instructions given and denies any further questions at this time.  ? ?

## 2022-01-13 NOTE — Telephone Encounter (Signed)
Lft pt vm to call ofc to sch stat CT. thanks 

## 2022-01-13 NOTE — Progress Notes (Addendum)
Orders Placed This Encounter  ?Procedures  ? Ambulatory referral to Infectious Disease  ?  Referral Priority:   Urgent  ?  Referral Type:   Consultation  ?  Referral Reason:   Specialty Services Required  ?  Referred to Provider:   Tommy Medal, Lavell Islam, MD  ?  Requested Specialty:   Infectious Diseases  ?  Number of Visits Requested:   1  ? The primary encounter diagnosis was Positive QuantiFERON-TB Gold test. Diagnoses of Leukocytosis, unspecified type, Other fatigue, Pneumonia of right lung due to infectious organism, unspecified part of lung, and Lactic acid blood increased were also pertinent to this visit.  ?Patient was called by provider 01/13/22 see telephone note.  ?Enteric precautions discussed and quarantine was advised.  ?

## 2022-01-13 NOTE — Progress Notes (Signed)
?  WBC is now normal., neutrophils down, and lymphocyte elevated.  ?

## 2022-01-13 NOTE — Progress Notes (Signed)
Lactic acid stable.still elevated.  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 I have consulted pulmonary Dr. Duwayne Heck over the weekend who is in agreement as well as Dr. Vanita Ingles.  ?HIV is non reactive. White blood cells are within normal limits.  ?Liver enzymes returned to normal.  ?Will need health department, and infectious disease ASAP.  ?Referral to infectious disease was placed. Marissa faxing form to health department this morning 01/13/22. Patient will need follow up with health department.  ?

## 2022-01-13 NOTE — Telephone Encounter (Addendum)
Spoke with communicable disease at [facility] for county of residence and faxed over all pertinent OV notes , X-ray results and lab test with demographics.  ?

## 2022-01-13 NOTE — Progress Notes (Signed)
CT of abdomen and pelvis is okay does show bilateral pleural effusions, previously noted she is currently on Levaquin and also seeing Infectious disease 01/14/22 for positive Quantiferon TB test that resulted over the weekend. She is on quarintine.  ?IMPRESSION: ?No evidence of appendicitis, urolithiasis, or other acute findings. ?within the abdomen or pelvis. ?Aortic Atherosclerosis (ICD10-I70.0). ? ?She should keep follow up with infectious disease, health department, pulmonary and gastroenterology. Get in with new PCP asap.

## 2022-01-13 NOTE — Progress Notes (Signed)
Lactic acid now within normal limits, continue to hold metformin, address with new PCP a new medication possibly for diabetes.

## 2022-01-13 NOTE — Progress Notes (Signed)
See phone note dated 01/09/2022 patient has been notified and health dept has been notified. ?

## 2022-01-13 NOTE — Progress Notes (Signed)
Patient is a 51yo female with persistent fatigue, cough (not productive, no hemoptysis), fevers, recent night sweats and elevated WBC who has been in and out of hospital for treatment of PNA, possible colitis, who recently had a positive QFT. Providers are concerned for possible active TB case and referred her for further evaluation. ? ?Reports night sweats started about 10 days ago, only has CP when coughing. Fatigue has been an ongoing issue since she hurt her back in October 2021. ? ?PCP  Laverna Peace FNP ?QFT + 01/07/2022 ?CXR- 12/31/2021 Normal CXR ?CXR 01/07/2022 "IMPRESSION: ?1. New small opacity at the right costophrenic angle could reflect ?atelectasis or developing infiltrate." ?HIV negative- 01/11/2022 ?CBC 01/10/2022 WBC wnl (was 22.4 12/25/2021) ? ?EPI completed today 01/13/2022 ? 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 Injured back October 2021 (not at work) and has been out of work on disability since then. ? ?Patient to come in for sputum sample collection, consider place PPD for repeat testing. ? ?Leigh Aurora, MD  ?

## 2022-01-14 ENCOUNTER — Other Ambulatory Visit: Payer: Self-pay

## 2022-01-14 ENCOUNTER — Telehealth (INDEPENDENT_AMBULATORY_CARE_PROVIDER_SITE_OTHER): Payer: 59 | Admitting: Infectious Disease

## 2022-01-14 ENCOUNTER — Telehealth: Payer: Self-pay | Admitting: Adult Health

## 2022-01-14 ENCOUNTER — Encounter: Payer: Self-pay | Admitting: Infectious Disease

## 2022-01-14 DIAGNOSIS — Z9889 Other specified postprocedural states: Secondary | ICD-10-CM

## 2022-01-14 DIAGNOSIS — A280 Pasteurellosis: Secondary | ICD-10-CM

## 2022-01-14 DIAGNOSIS — R1011 Right upper quadrant pain: Secondary | ICD-10-CM

## 2022-01-14 DIAGNOSIS — R1031 Right lower quadrant pain: Secondary | ICD-10-CM

## 2022-01-14 DIAGNOSIS — R7612 Nonspecific reaction to cell mediated immunity measurement of gamma interferon antigen response without active tuberculosis: Secondary | ICD-10-CM

## 2022-01-14 DIAGNOSIS — R509 Fever, unspecified: Secondary | ICD-10-CM

## 2022-01-14 DIAGNOSIS — W5501XA Bitten by cat, initial encounter: Secondary | ICD-10-CM

## 2022-01-14 DIAGNOSIS — K219 Gastro-esophageal reflux disease without esophagitis: Secondary | ICD-10-CM

## 2022-01-14 DIAGNOSIS — J90 Pleural effusion, not elsewhere classified: Secondary | ICD-10-CM

## 2022-01-14 DIAGNOSIS — K0889 Other specified disorders of teeth and supporting structures: Secondary | ICD-10-CM

## 2022-01-14 DIAGNOSIS — E118 Type 2 diabetes mellitus with unspecified complications: Secondary | ICD-10-CM

## 2022-01-14 DIAGNOSIS — R053 Chronic cough: Secondary | ICD-10-CM

## 2022-01-14 HISTORY — DX: Pasteurellosis: A28.0

## 2022-01-14 HISTORY — DX: Bitten by cat, initial encounter: W55.01XA

## 2022-01-14 NOTE — Telephone Encounter (Signed)
Sorry someone else sent. ?

## 2022-01-14 NOTE — Progress Notes (Addendum)
? ?Virtual Visit via Video Note ? ?I connected with Kathleen Carr on 01/14/22 at 10:00 AM EDT by a video enabled telemedicine application and verified that I am speaking with the correct person using two identifiers. ? ?Location: ?Patient: Home ?Provider: [facility] ?  ?I discussed the limitations of evaluation and management by telemedicine and the availability of in person appointments. The patient expressed understanding and agreed to proceed ? ? ? ?Reason fof Infectious Disease Consult: Positive QuantiFERON gold chronic cough fevers night sweats concern for tuberculosis ? ?Requesting Physician: Laverna Peace, FNP ? ?Subjective:  ? ? Patient ID: Kathleen Carr, female    DOB: 11-02-1970, 51 y.o.   MRN: [id number] ? ?HPI ? ?Kathleen Carr is a a 51 year old Caucasian lady with medical history significant for bipolar disorder gastroesophageal reflux disease hypertension who was diagnosed with diabetes mellitus in 2018 when she was hospitalized with sepsis in the context of a multifocal pneumonia. ? ?Her infectious disease history is also pertinent for suffering at cat bite from a feral cat on her finger in December 2022.  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 ? ?She was hospitalized and given intravenous antibiotics and had surgery as mentioned.  Finger abscess resolved. ? ?This winter she began having a cough that is never gone away. ? ?She did a video visit on December 17, 2021 when she had symptoms acutely of a sore throat and sinus congestion as well as a temperature of 100.2 body aches headache and some pain in her ears. ? ?She was advised to get tested for COVID-19 and influenza. ? ?In the interim she was seen in urgent care in [place] August 20, 2022.  She was having continued fevers and sinus congestion and sore throat.  She had been started on clindamycin which she felt was not helping.  She was also given a prednisone course. ? ?Her flu and  tested COVID testing in [place] were negative. ? ?She was then prescribed Augmentin by Laverna Peace ? ?She had labs drawn which showed a white count of 22,400 and absolute neutrophil count of 12,300. ? ?She then went to the emergency department at [facility] later that day due to concerns about her high white count and still not feeling well. ? ?CBC showed a white blood cell count to come down to 18,800. ? ?Staff ordered a CT angiogram which showed no evidence of pulmonary embolism and no parenchymal lung disease but a trace pleural effusion and as well as some mildly enlarged bilateral hilar lymph nodes. ? ?Obtain blood cultures that time though she was on Augmentin and those did not reveal any organism. ? ?Culture was taken and was negative for any organisms including group A strep.. ? ? ?Been clinic complaining of some right upper quadrant pain and an ultrasound of the abdomen was obtained ? ? ?Still felt her leukocytosis was likely due to prednisone plus her recent infection they were not convinced by the imaging that did not show any parenchymal disease that she had pneumonia but did add azithromycin to her Augmentin. ? ?She then saw PCP again with cough was still persisting but not productive. ? ?She developed the central infection and was treated for this. ? ?Patient continued to feel poorly was not having flank pain and still concerns about elevated white count and subjectively feeling unwell. ? ?She is seen in the emerge apartment [place]. ? ?Labs done  at the [facility] ER showed her to still have white count of 21,000 with predominant neutrophils of 8.5. ? ?P was fairly unremarkable other than slightly elevated AST at 64.  They was negative HIV test was negative blood cultures were taken again I believe she still had antibiotics on board and they were both negative. ? ?CT of the abdomen pelvis was performed at [facility] which showed some mild thickening of the  rectosigmoid colon though they was felt it was more likely under distention. ? ?Patient continued to not feel well and was still having a cough and still has a cough though it continues to be nonproductive.  She has been having subjective fevers and measured fevers up to 101 degrees recently. ? ?She has night sweats but she is not drenching her close and sweats rather just feeling sweaty and feeling cold when she sleeps. ? ?She was further worked up by PCP who obtained labs and acute including QuantiFERON gold which was positive. ? ?Chest x-ray performed on the 14th and showed possible opacity right costophrenic angle though I have reviewed these films myself and find it to be very underwhelming. ? ?In any case she was placed on levofloxacin 750 mg daily which he has been taking since then.  She does she believes she feels a little bit better since being on the levofloxacin with less subjective chills and fevers.  Cough does continue. ? ?She had another CT of the abdomen pelvis performed yesterday.  ?The CT caught the lower part of the chest which showed trace bilateral pleural effusions, and aortic atherosclerosis but no intra-abdominal pathology concerning for infection or malignancy. ? ?I have personally reviewed CT images and I do not appreciate any parenchymal disease on the CT of the abdomen pelvis unless there is something obscured by the effusions. ? ?Her white blood cell count is subsequently normalized come down to 10.4 when checked on 17 March. ? ?She has been tested for HIV and viral hepatitides and all these tests have been negative sedimentation rate when checked on 10 March was 26. ? ? ? ?She was referred to the health department and evaluated by Sherrilee Gilles, MD in [place]. ? ?Her Lolita Lenz plans on placing PPD and obtaining sputum cultures for AFB stain and culture. ? ?I am doing that such speed I will need to be induced as the patient does not make productive sputum at this point in time.  Cultures  could be somewhat compromised by the levofloxacin as the flora quinolones can sometimes have activity against mycobacteria. ? ? ? ? ? ? ? ?Past Medical History:  ?Diagnosis Date  ? Allergy   ? Anxiety   ? Arthritis   ? Asthma   ? Bipolar depression ([place])   ? Cat bite 01/14/2022  ? Depression   ? Diabetes mellitus without complication ([place])   ? GERD (gastroesophageal reflux disease)   ? Hypertension   ? 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 PTSD (post-traumatic stress disorder)   ? PTSD (post-traumatic stress disorder)   ? Thyroid disease   ? ? ?Past Surgical History:  ?Procedure Laterality Date  ? ABDOMINAL HYSTERECTOMY    ? CHOLECYSTECTOMY    ? KNEE ARTHROSCOPY Bilateral   ? TONSILLECTOMY    ? ? ?Family History  ?Problem Relation Age of Onset  ? Hypertension Mother   ? Thyroid disease Mother   ? Cancer Father 20  ?     carcinoid, right lung  ? Ulcerative colitis Father   ? Glaucoma Maternal Grandmother   ?  Renal Disease Maternal Grandfather   ? Diabetes Maternal Grandfather   ?     Type2  ? Stroke Paternal Grandfather   ? Hypertension Paternal Grandfather   ? ? ?  ?Social History  ? ?Socioeconomic History  ? Marital status: Married  ?  Spouse name: Not on file  ? Number of children: Not on file  ? Years of education: Not on file  ? Highest education level: Not on file  ?Occupational History  ? Not on file  ?Tobacco Use  ? Smoking status: Some Days  ?  Packs/day: 0.33  ?  Types: Cigarettes  ?  Last attempt to quit: 05/25/2020  ?  Years since quitting: 1.6  ? Smokeless tobacco: Never  ? Tobacco comments:  ?  Smoked since she was 51 years old  ?Vaping Use  ? Vaping Use: Some days  ?Substance and Sexual Activity  ? Alcohol use: Not Currently  ? Drug use: Not Currently  ? Sexual activity: Not Currently  ?  Partners: Female  ?  Comment: married to wife  ?Other Topics Concern  ? Not on file  ?Social History Narrative  ? Not on file  ? ?Social Determinants of Health  ? ?Financial Resource Strain: Not on file  ?Food Insecurity:  Not on file  ?Transportation Needs: Not on file  ?Physical Activity: Not on file  ?Stress: Not on file  ?Social Connections: Not on file  ? ? ?Allergies  ?Allergen Reactions  ? Dust Mite Mixed Allergen Ex

## 2022-01-14 NOTE — Telephone Encounter (Signed)
[facility] called and said they received 3 faxes about this patient and the faxes need to go to [place]. Please don't sent to [phone] that is [facility] fax number. Thank you. ?

## 2022-01-15 ENCOUNTER — Ambulatory Visit: Payer: Medicaid Other | Admitting: Surgery

## 2022-01-15 ENCOUNTER — Other Ambulatory Visit: Payer: Self-pay

## 2022-01-15 ENCOUNTER — Other Ambulatory Visit: Payer: Self-pay | Admitting: Adult Health

## 2022-01-15 VITALS — Wt 178.0 lb

## 2022-01-15 DIAGNOSIS — R7612 Nonspecific reaction to cell mediated immunity measurement of gamma interferon antigen response without active tuberculosis: Secondary | ICD-10-CM

## 2022-01-15 DIAGNOSIS — D72820 Lymphocytosis (symptomatic): Secondary | ICD-10-CM

## 2022-01-15 NOTE — Progress Notes (Signed)
Patient is a 51yo female with +QFT in the setting of persistent fatigue, recent night sweats, dry cough, RUQ pain (s/p cholecystectomy), and elevated WBC recently seen in ED. Not highly suspect for active TB, CXR had very small pleural effusions and cough is not productive, had appointment with ID doc who also places TB low on suspicion list but will continue testing in hopes of ruling out TB. Will obtain sputum sample today. ? ?Will also repeat TB testing with a PPD skin test. Possible that QFT was a false positive in the context of her acute inflammatory state.  ? ?If PPD also positive will consider treat ment for LTBI versus active disease depending on sputum sample evidence and additional workup. ? ?Obtained AM sputum sample by induction today in clinic in negative pressure room. Patient sent home with 2 empty conical tubes for sputum collection tonight and in AM. Instructed to keep specimens in the fridge when collected and contact me for drop-off once collected. ? ?Leigh Aurora, MD  ?

## 2022-01-17 ENCOUNTER — Other Ambulatory Visit: Payer: Self-pay | Admitting: Surgery

## 2022-01-17 DIAGNOSIS — R7612 Nonspecific reaction to cell mediated immunity measurement of gamma interferon antigen response without active tuberculosis: Secondary | ICD-10-CM

## 2022-01-20 ENCOUNTER — Other Ambulatory Visit: Payer: Self-pay

## 2022-01-20 ENCOUNTER — Ambulatory Visit (LOCAL_COMMUNITY_HEALTH_CENTER): Payer: Self-pay

## 2022-01-20 DIAGNOSIS — R7612 Nonspecific reaction to cell mediated immunity measurement of gamma interferon antigen response without active tuberculosis: Secondary | ICD-10-CM

## 2022-01-20 NOTE — Progress Notes (Signed)
In Nurse Clinic for ppd which was placed today. PPD placed as f-u to +QFT and pt's symptoms. Pt had  +QFT 01/07/2022 and epi completed by Dr Westley Foots. Josie Saunders, RN ? ?

## 2022-01-23 ENCOUNTER — Ambulatory Visit (LOCAL_COMMUNITY_HEALTH_CENTER): Payer: Self-pay

## 2022-01-23 DIAGNOSIS — R7612 Nonspecific reaction to cell mediated immunity measurement of gamma interferon antigen response without active tuberculosis: Secondary | ICD-10-CM

## 2022-01-23 LAB — TB SKIN TEST
Induration: 0 mm
TB Skin Test: NEGATIVE

## 2022-01-28 ENCOUNTER — Ambulatory Visit: Payer: 59 | Admitting: Pulmonary Disease

## 2022-01-28 ENCOUNTER — Encounter: Payer: Self-pay | Admitting: Pulmonary Disease

## 2022-01-28 ENCOUNTER — Other Ambulatory Visit
Admission: RE | Admit: 2022-01-28 | Discharge: 2022-01-28 | Disposition: A | Discharge status: Home / Self Care | Payer: 59 | Source: Ambulatory Visit | Attending: Pulmonary Disease | Admitting: Pulmonary Disease

## 2022-01-28 VITALS — BP 118/60 | HR 97 | Temp 97.7°F | Ht 64.0 in | Wt 178.6 lb

## 2022-01-28 DIAGNOSIS — Z9109 Other allergy status, other than to drugs and biological substances: Secondary | ICD-10-CM | POA: Diagnosis not present

## 2022-01-28 DIAGNOSIS — R0602 Shortness of breath: Secondary | ICD-10-CM | POA: Diagnosis not present

## 2022-01-28 DIAGNOSIS — J45909 Unspecified asthma, uncomplicated: Secondary | ICD-10-CM

## 2022-01-28 NOTE — Progress Notes (Signed)
Subjective:    Patient ID: Kathleen Carr, female    DOB: 09-23-1971, 51 y.o.   MRN: [id number] Patient Care Team: Berniece Pap, FNP as PCP - General [facility])  Chief Complaint  Patient presents with   pulmonary consult    Recent CXR--occ sob with exertion.     HPI Patient is a 51 year old former smoker (quit 3 days ago, 21 PY) presents for evaluation of shortness of breath and symptoms of URI for approximately 5 weeks.  She is kindly referred by Marvell Fuller, NP.  She was initially noted to have difficulties around February 2023.  She noted cough that was for the most part nonproductive no hemoptysis.  She has noted significant fatigue and shortness of breath for approximately 1 month.  However, over the last few days she has noted that she has started to improve significantly.  She has had a prior diagnosis of asthma in her 51s.  She had been on Advair and Singulair since 2010 but not on these currently.  She has noted to have sensitivity to animal danders.  [place] horses and cats in the premises.  I reviewed the laboratory data from March it appears that she had a viral syndrome could have very well have been RSV.  She also had very mild mediastinal lymphadenopathy which could have been due to this.  She also had a small pleural effusion noted.  On subsequent films however this appears to have resolved/improved.  On an x-ray of the 14th march chest x-rays on 14 March she was noted to have a lower lobe opacity that could reflect either atelectasis or infiltrate.  She received antibiotics for the same.  On subsequent CT of the abdomen and pelvis no infiltrate could be seen on the lower chest and only very tiny bilateral pleural effusions were noted.  She discontinue smoking approximately 3 days ago.  She does use vape.  Counseled with regards to using vape, not recommended.   Review of Systems A 10 point review of systems was  performed and it is as noted above otherwise negative.  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   Pneumonia    PTSD (post-traumatic stress disorder)    Smoker    Thyroid disease    Past Surgical History:  Procedure Laterality Date   ABDOMINAL HYSTERECTOMY  12/03/2011   complete   ADENOIDECTOMY, TONSILLECTOMY AND MYRINGOTOMY WITH TUBE PLACEMENT  1977   CHOLECYSTECTOMY  06/08/2011   COLONOSCOPY WITH PROPOFOL N/A 02/05/2022   Procedure: COLONOSCOPY WITH PROPOFOL;  Surgeon: Wyline Mood, MD;  Location: [facility] ENDOSCOPY;  Service: Gastroenterology;  Laterality: N/A;   HIP ARTHROSCOPY Right 08/19/2022   Procedure: Right hip arthroscopy, acetabuloplasty, labral repair, femoral osteochondroplasty, capsular closure;  Surgeon: Signa Kell, MD;  Location: [facility] ORS;  Service: Orthopedics;  Laterality: Right;   KNEE ARTHROSCOPY Right 1993   KNEE ARTHROSCOPY Left 1998   LUMBAR LAMINECTOMY Left 2021   Family History  Problem Relation Age of Onset   Hypertension Mother    Thyroid disease Mother    Lung cancer  Father 69       carcinoid, right lung   Ulcerative colitis Father    Renal Disease Maternal Grandfather    Diabetes Maternal Grandfather        Type2   Glaucoma Maternal Grandmother    Stroke Paternal Grandfather    Hypertension Paternal Grandfather    Social History   Tobacco Use   Smoking status: Former    Current packs/day: 0.00    Average packs/day: 0.5 packs/day for 42.0 years (21.0 ttl pk-yrs)    Types: Cigarettes    Start date: 01/25/1981    Quit date: 01/26/2023    Years since quitting: 0.3   Smokeless tobacco: Never  Substance Use Topics   Alcohol use: Not Currently    Current Meds  Medication Sig   albuterol (PROAIR HFA) 108 (90 Base) MCG/ACT inhaler Inhale 1-2 puffs into the lungs every 6 (six) hours as needed for wheezing.   ALPRAZolam (XANAX) 0.25 MG tablet Take 1-2 tablets (0.25-0.5 mg total) by mouth 2 (two) times daily as needed for anxiety (will cause drowsiness.).   cholecalciferol (VITAMIN D3) 25 MCG (1000 UNIT) tablet Take 1,000 Units by mouth daily.   cyclobenzaprine (FLEXERIL) 10 MG tablet Take 1 tablet (10 mg total) by mouth 3 (three) times daily as needed for muscle spasms (will cause drowsiness.).   levothyroxine (SYNTHROID) 50 MCG tablet TAKE 1 TABLET BY MOUTH DAILY BEFORE BREAKFAST.   loratadine (CLARITIN) 10 MG tablet Take 10 mg by mouth daily.   metFORMIN (GLUCOPHAGE) 500 MG tablet Take 1 tablet (500 mg total) by mouth 2 (two) times daily with a meal.   oxybutynin (DITROPAN XL) 10 MG 24 hr tablet Take 1 tablet (10 mg total) by mouth at bedtime.   pantoprazole (PROTONIX) 40 MG tablet Take 1 tablet (40 mg total) by mouth daily.   promethazine-dextromethorphan (PROMETHAZINE-DM) 6.25-15 MG/5ML syrup Take 5 mLs by mouth at bedtime as needed for cough.   QUEtiapine (SEROQUEL) 300 MG tablet TAKE 1 TABLET (300 MG TOTAL) BY MOUTH AT BEDTIME.   rosuvastatin (CRESTOR) 5 MG tablet TAKE 1 TABLET BY MOUTH DAILY.   sertraline (ZOLOFT) 100 MG tablet TAKE 1 TABLET BY MOUTH DAILY   zolpidem (AMBIEN) 5 MG tablet TAKE 1 TABLET (5 MG TOTAL) BY MOUTH AT BEDTIME AS NEEDED FOR SLEEP.       Objective:   Physical Exam BP 118/60 (BP Location: Left Arm, Cuff Size: Normal)   Pulse 97   Temp 97.7 F (36.5 C) (Temporal)   Ht 5\' 4"  (1.626 m)   Wt 178 lb 9.6 oz (81 kg)   SpO2 97%   BMI 30.66 kg/m   SpO2: 97 % O2 Device: None (Room air)  GENERAL: Well-developed, overweight woman, no acute distress, no conversational dyspnea.  Fully ambulatory. HEAD: Normocephalic, atraumatic.  EYES: Pupils equal, round, reactive to light.  No scleral icterus.  MOUTH:  Partial lower, few chipped teeth, oral mucosa moist.  No thrush.  Benign. NECK: Supple. No thyromegaly. Trachea midline. No JVD.  No adenopathy. PULMONARY: Good air entry bilaterally.  No adventitious sounds. CARDIOVASCULAR: S1 and S2. Regular rate and rhythm.  No rubs, murmurs or gallops heard. ABDOMEN: Benign. MUSCULOSKELETAL: No joint deformity, no clubbing, no edema.  NEUROLOGIC: No overt focal deficit, no gait disturbance, speech is fluent. SKIN: Intact,warm,dry. PSYCH: Mood and behavior normal.     Assessment & Plan:     ICD-10-CM   1. Shortness of breath  R06.02 Pulmonary Function Test [facility] Only    Allergy  Panel, Animal Group    IgE   This is improving according to the patient Will assess with PFTs    2. Asthmatic bronchitis without complication, unspecified asthma severity, unspecified whether persistent  J45.909    Suspect decompensation of asthmatic bronchitis due to viral illness Possible RSV now resolved Continue as needed albuterol Assess with PFTs    3. Environmental allergies  Z91.09    Story of sensitivity to animal danders Check allergy panel/animal group IgE     Orders Placed This Encounter  Procedures   Allergy Panel, Animal Group    Standing Status:   Future    Number of Occurrences:   1    Standing Expiration Date:   01/29/2023   IgE    Standing Status:   Future    Number of Occurrences:   1    Standing Expiration Date:   01/29/2023   Pulmonary Function Test [facility] Only    Standing Status:   Future    Number of Occurrences:   1    Standing Expiration Date:   01/29/2023    Scheduling Instructions:     Next available.    Order Specific Question:   Full PFT: includes the following: basic spirometry, spirometry pre & post bronchodilator, diffusion capacity (DLCO), lung volumes    Answer:   Full PFT   The patient appears to be resolving the prolonged illness she had.  I suspect that this was triggered by a viral illness.  Will assess her residual shortness  of breath with pulmonary function testing.  She does have sensitivity to animal danders and we will recheck this by allergy panel as above.  She was counseled regards to discontinuation of smoking and vaping.  We will see the patient in 6-8 weeks time, she should contact us prior to that time should any new difficulties arise.  Gailen Shelter, MD Advanced Bronchoscopy [facility]-    *This note was dictated using voice recognition software/Dragon.  Despite best efforts to proofread, errors can occur which can change the meaning. Any transcriptional errors that result from this process are unintentional and may not be fully corrected at the time of dictation.

## 2022-01-28 NOTE — Patient Instructions (Signed)
Looking at your blood counts it appears to me that you had a viral infection likely was RSV. ? ?I am not worried about the findings in your lung.  The follow-up CT that you had in your abdomen and pelvis that showed the bottom part of your lungs and that looked fairly good. ? ?We will get some breathing tests this will tell me about your airways. ? ?We will get some blood work to check on animal dander sensitivities. ? ?I do recommend that you quit smoking. ? ?We will see you in follow-up in 6 to 8 weeks time call sooner should any new difficulties arise. ?

## 2022-01-29 ENCOUNTER — Other Ambulatory Visit: Payer: Self-pay

## 2022-01-29 ENCOUNTER — Encounter: Payer: Self-pay | Admitting: Gastroenterology

## 2022-01-29 ENCOUNTER — Ambulatory Visit (INDEPENDENT_AMBULATORY_CARE_PROVIDER_SITE_OTHER): Payer: 59 | Admitting: Gastroenterology

## 2022-01-29 VITALS — BP 121/70 | HR 86 | Temp 98.8°F | Ht 64.0 in | Wt 176.6 lb

## 2022-01-29 DIAGNOSIS — K5900 Constipation, unspecified: Secondary | ICD-10-CM

## 2022-01-29 DIAGNOSIS — K219 Gastro-esophageal reflux disease without esophagitis: Secondary | ICD-10-CM | POA: Diagnosis not present

## 2022-01-29 DIAGNOSIS — Z1211 Encounter for screening for malignant neoplasm of colon: Secondary | ICD-10-CM

## 2022-01-29 MED ORDER — NA SULFATE-K SULFATE-MG SULF 17.5-3.13-1.6 GM/177ML PO SOLN
1.0000 | Freq: Once | ORAL | 0 refills | Status: AC
  Filled 2022-01-29: qty 354, 1d supply, fill #0

## 2022-01-29 NOTE — Progress Notes (Signed)
?  ?[name] MD, MRCP(U.K) ?[address]  ?[address]  ?[place], [place] [address]  ?Main: [phone]  ?Fax: [phone] ? ? ?Gastroenterology Consultation ? ?Referring Provider:     Sharmon Leyden* ?Primary Care Physician:  Doreen Beam, FNP ?Primary Gastroenterologist:  Dr. [name]  ?Reason for Consultation:    GERD and screening colonoscopy ?      ? HPI:   ?Kathleen Carr is a 51 y.o. y/o female referred for consultation & management  by Doreen Beam, FNP.   ?She says that she has had issues with acid reflux for many years.  Symptoms are usually when she lays flat at night.  Can wake up in the middle of the night with symptoms of heartburn.  She has been on Protonix for many years which used to work well but recently has not been working.  Takes it 30 minutes before breakfast.  Has a few episodes of symptoms every week.  Also been having some difficulty swallowing meat at times.  No family history of esophageal cancer.  She is a smoker.  Has gas and bloating abdominal distention at times.  At times she does suffer from constipation has not tried any over-the-counter agents.  Never had a colonoscopy no change in bowel habits no rectal bleeding no family history of colon cancer or polyps. ? ?01/13/2022 CT abdomen pelvis without contrast showed no acute findings. ?01/10/2022 hemoglobin 13.8 LFTs normal ? ? ? ? ?Past Medical History:  ?Diagnosis Date  ? Allergy   ? Anxiety   ? Arthritis   ? Asthma   ? Bipolar depression ([place])   ? Cat bite 01/14/2022  ? Depression   ? Diabetes mellitus without complication ([place])   ? GERD (gastroesophageal reflux disease)   ? Hypertension   ? 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 PTSD (post-traumatic stress disorder)   ? PTSD (post-traumatic stress disorder)   ? Thyroid disease   ? ? ?Past Surgical History:  ?Procedure Laterality Date  ? ABDOMINAL HYSTERECTOMY    ? CHOLECYSTECTOMY    ? KNEE ARTHROSCOPY Bilateral   ? TONSILLECTOMY    ? ? ?Prior to Admission  medications   ?Medication Sig Start Date End Date Taking? Authorizing Provider  ?albuterol (PROAIR HFA) 108 (90 Base) MCG/ACT inhaler Inhale 1-2 puffs into the lungs every 6 (six) hours as needed for wheezing. 12/31/21   Flinchum, Kelby Aline, FNP  ?ALPRAZolam (XANAX) 0.25 MG tablet Take 1-2 tablets (0.25-0.5 mg total) by mouth 2 (two) times daily as needed for anxiety (will cause drowsiness.). 12/25/21   Flinchum, Kelby Aline, FNP  ?cholecalciferol (VITAMIN D3) 25 MCG (1000 UNIT) tablet Take 1,000 Units by mouth daily.    [provider]  ?cyclobenzaprine (FLEXERIL) 10 MG tablet Take 1 tablet (10 mg total) by mouth 3 (three) times daily as needed for muscle spasms (will cause drowsiness.). 12/25/21   Flinchum, Kelby Aline, FNP  ?levothyroxine (SYNTHROID) 50 MCG tablet TAKE 1 TABLET BY MOUTH DAILY BEFORE BREAKFAST. 08/20/21 08/20/22  Crecencio Mc, MD  ?loratadine (CLARITIN) 10 MG tablet Take 10 mg by mouth daily.    [provider]  ?metFORMIN (GLUCOPHAGE) 500 MG tablet Take 1 tablet (500 mg total) by mouth 2 (two) times daily with a meal. 11/19/21   Flinchum, Kelby Aline, FNP  ?oxybutynin (DITROPAN XL) 10 MG 24 hr tablet Take 1 tablet (10 mg total) by mouth at bedtime. 08/20/21   Crecencio Mc, MD  ?pantoprazole (PROTONIX) 40 MG tablet  Take 1 tablet (40 mg total) by mouth daily. 08/20/21 08/20/22  Crecencio Mc, MD  ?promethazine-dextromethorphan (PROMETHAZINE-DM) 6.25-15 MG/5ML syrup Take 5 mLs by mouth at bedtime as needed for cough. 01/09/22   Flinchum, Kelby Aline, FNP  ?QUEtiapine (SEROQUEL) 300 MG tablet TAKE 1 TABLET (300 MG TOTAL) BY MOUTH AT BEDTIME. 11/14/21 11/14/22  Flinchum, Kelby Aline, FNP  ?rosuvastatin (CRESTOR) 5 MG tablet TAKE 1 TABLET BY MOUTH DAILY. 08/20/21 08/20/22  Crecencio Mc, MD  ?sertraline (ZOLOFT) 100 MG tablet TAKE 1 TABLET BY MOUTH DAILY 11/14/21 11/14/22  Flinchum, Kelby Aline, FNP  ?zolpidem (AMBIEN) 5 MG tablet TAKE 1 TABLET (5 MG TOTAL) BY MOUTH AT BEDTIME AS NEEDED FOR  SLEEP. 11/19/21 05/18/22  Flinchum, Kelby Aline, FNP  ?sitaGLIPtin (JANUVIA) 100 MG tablet Take 1 tablet (100 mg total) by mouth daily. 08/08/20 01/22/21  Flinchum, Kelby Aline, FNP  ? ? ?Family History  ?Problem Relation Age of Onset  ? Hypertension Mother   ? Thyroid disease Mother   ? Cancer Father 55  ?     carcinoid, right lung  ? Ulcerative colitis Father   ? Glaucoma Maternal Grandmother   ? Renal Disease Maternal Grandfather   ? Diabetes Maternal Grandfather   ?     Type2  ? Stroke Paternal Grandfather   ? Hypertension Paternal Grandfather   ?  ? ?Social History  ? ?Tobacco Use  ? Smoking status: Some Days  ?  Packs/day: 0.50  ?  Years: 42.00  ?  Pack years: 21.00  ?  Types: Cigarettes  ?  Last attempt to quit: 05/25/2020  ?  Years since quitting: 1.6  ? Smokeless tobacco: Never  ? Tobacco comments:  ?  0.5 PPD 01/28/2022   ?  Smoked since she was 51 years old  ?Vaping Use  ? Vaping Use: Some days  ?Substance Use Topics  ? Alcohol use: Not Currently  ? Drug use: Not Currently  ? ? ?Allergies as of 01/29/2022 - Review Complete 01/28/2022  ?Allergen Reaction Noted  ? Dust mite mixed allergen ext [mite (d. farinae)]  08/01/2020  ? Other Hives 08/01/2020  ? Sulfa antibiotics Hives 06/24/2020  ? ? ?Review of Systems:    ?All systems reviewed and negative except where noted in HPI. ? ? Physical Exam:  ?There were no vitals taken for this visit. ?No LMP recorded. Patient has had a hysterectomy. ?Psych:  Alert and cooperative. Normal mood and affect. ?General:   Alert,  Well-developed, well-nourished, pleasant and cooperative in NAD ?Head:  Normocephalic and atraumatic. ?Eyes:  Sclera clear, no icterus.   Conjunctiva pink. ?Ears:  Normal auditory acuity.   ?Neurologic:  Alert and oriented x3;  grossly normal neurologically. ?Psych:  Alert and cooperative. Normal mood and affect. ? ?Imaging Studies: ?CT Abdomen Pelvis Wo Contrast ? ?Result Date: 01/13/2022 ?CLINICAL DATA:  Right flank pain. Right lower quadrant pain.  Leukocytosis. EXAM: CT ABDOMEN AND PELVIS WITHOUT CONTRAST TECHNIQUE: Multidetector CT imaging of the abdomen and pelvis was performed following the standard protocol without IV contrast. RADIATION DOSE REDUCTION: This exam was performed according to the departmental dose-optimization program which includes automated exposure control, adjustment of the mA and/or kV according to patient size and/or use of iterative reconstruction technique. COMPARISON:  None. FINDINGS: Lower chest: Tiny bilateral pleural effusions. Hepatobiliary: No mass visualized on this unenhanced exam. Prior cholecystectomy. No evidence of biliary obstruction. Pancreas: No mass or inflammatory process visualized on this unenhanced exam. Spleen:  Within normal limits in size.  Adrenals/Urinary tract: No evidence of urolithiasis or hydronephrosis. Unremarkable unopacified urinary bladder. Stomach/Bowel: No evidence of obstruction, inflammatory process, or abnormal fluid collections. Normal appendix visualized. Vascular/Lymphatic: No pathologically enlarged lymph nodes identified. No evidence of abdominal aortic aneurysm. Aortic atherosclerotic calcification noted. Reproductive: Prior hysterectomy noted. Adnexal regions are unremarkable in appearance. Other:  None. Musculoskeletal:  No suspicious bone lesions identified. IMPRESSION: No evidence of appendicitis, urolithiasis, or other acute findings within the abdomen or pelvis. Tiny bilateral pleural effusions incidentally noted. Aortic Atherosclerosis (ICD10-I70.0). Electronically Signed   By: Marlaine Hind M.D.   On: 01/13/2022 16:08  ? ?DG Chest 2 View ? ?Result Date: 01/07/2022 ?CLINICAL DATA:  Cough. EXAM: CHEST - 2 VIEW COMPARISON:  Chest x-ray dated December 31, 2021. FINDINGS: The heart size and mediastinal contours are within normal limits. Normal pulmonary vascularity. New small opacity at the right costophrenic angle. No pleural effusion or pneumothorax. No acute osseous abnormality. IMPRESSION:  1. New small opacity at the right costophrenic angle could reflect atelectasis or developing infiltrate. Electronically Signed   By: Titus Dubin M.D.   On: 01/07/2022 11:04  ? ?DG Chest 2 View ? ?Result Da

## 2022-01-29 NOTE — Patient Instructions (Signed)
?Food Choices for Gastroesophageal Reflux Disease, Adult ?When you have gastroesophageal reflux disease (GERD), the foods you eat and your eating habits are very important. Choosing the right foods can help ease your discomfort. Think about working with a food expert (dietitian) to help you make good choices. ?What are tips for following this plan? ?Reading food labels ?Look for foods that are low in saturated fat. Foods that may help with your symptoms include: ?Foods that have less than 5% of daily value (DV) of fat. ?Foods that have 0 grams of trans fat. ?Cooking ?Do not fry your food. ?Cook your food by baking, steaming, grilling, or broiling. These are all methods that do not need a lot of fat for cooking. ?To add flavor, try to use herbs that are low in spice and acidity. ?Meal planning ? ?Choose healthy foods that are low in fat, such as: ?Fruits and vegetables. ?Whole grains. ?Low-fat dairy products. ?Lean meats, fish, and poultry. ?Eat small meals often instead of eating 3 large meals each day. Eat your meals slowly in a place where you are relaxed. Avoid bending over or lying down until 2-3 hours after eating. ?Limit high-fat foods such as fatty meats or fried foods. ?Limit your intake of fatty foods, such as oils, butter, and shortening. ?Avoid the following as told by your doctor: ?Foods that cause symptoms. These may be different for different people. Keep a food diary to keep track of foods that cause symptoms. ?Alcohol. ?Drinking a lot of liquid with meals. ?Eating meals during the 2-3 hours before bed. ?Lifestyle ?Stay at a healthy weight. Ask your doctor what weight is healthy for you. If you need to lose weight, work with your doctor to do so safely. ?Exercise for at least 30 minutes on 5 or more days each week, or as told by your doctor. ?Wear loose-fitting clothes. ?Do not smoke or use any products that contain nicotine or tobacco. If you need help quitting, ask your doctor. ?Sleep with the head  of your bed higher than your feet. Use a wedge under the mattress or blocks under the bed frame to raise the head of the bed. ?Chew sugar-free gum after meals. ?What foods should eat? ?Eat a healthy, well-balanced diet of fruits, vegetables, whole grains, low-fat dairy products, lean meats, fish, and poultry. Each person is different. ?Foods that may cause symptoms in one person may not cause any symptoms in another person. Work with your doctor to find foods that are safe for you. ?The items listed above may not be a complete list of what you can eat and drink. Contact a food expert for more options. ?What foods should I avoid? ?Limiting some of these foods may help in managing the symptoms of GERD. Everyone is different. Talk with a food expert or your doctor to help you find the exact foods to avoid, if any. ?Fruits ?Any fruits prepared with added fat. Any fruits that cause symptoms. For some people, this may include citrus fruits, such as oranges, grapefruit, pineapple, and lemons. ?Vegetables ?Deep-fried vegetables. [place] fries. Any vegetables prepared with added fat. Any vegetables that cause symptoms. For some people, this may include tomatoes and tomato products, chili peppers, onions and garlic, and horseradish. ?Grains ?Pastries or quick breads with added fat. ?Meats and other proteins ?High-fat meats, such as fatty beef or pork, hot dogs, ribs, ham, sausage, salami, and bacon. Fried meat or protein, including fried fish and fried chicken. Nuts and nut butters, in large amounts. ?Dairy ?Whole milk  and chocolate milk. Sour cream. Cream. Ice cream. Cream cheese. Milkshakes. ?Fats and oils ?Butter. Margarine. Shortening. Ghee. ?Beverages ?Coffee and tea, with or without caffeine. Carbonated beverages. Sodas. Energy drinks. Fruit juice made with acidic fruits, such as orange or grapefruit. Tomato juice. Alcoholic drinks. ?Sweets and desserts ?Chocolate and cocoa. Donuts. ?Seasonings and condiments ?Pepper.  Peppermint and spearmint. Added salt. Any condiments, herbs, or seasonings that cause symptoms. For some people, this may include curry, hot sauce, or vinegar-based salad dressings. ?The items listed above may not be a complete list of what you should not eat and drink. Contact a food expert for more options. ?Questions to ask your doctor ?Diet and lifestyle changes are often the first steps that are taken to manage symptoms of GERD. If diet and lifestyle changes do not help, talk with your doctor about taking medicines. ?Where to find more information ?International Foundation for Gastrointestinal Disorders: aboutgerd.org ?Summary ?When you have GERD, food and lifestyle choices are very important in easing your symptoms. ?Eat small meals often instead of 3 large meals a day. Eat your meals slowly and in a place where you are relaxed. ?Avoid bending over or lying down until 2-3 hours after eating. ?Limit high-fat foods such as fatty meats or fried foods. ?This information is not intended to replace advice given to you by your health care provider. Make sure you discuss any questions you have with your health care provider. ?Document Revised: 04/23/2020 Document Reviewed: 04/23/2020 ?Elsevier Patient Education ? [place]. ?High-Fiber Eating Plan ?Fiber, also called dietary fiber, is a type of carbohydrate. It is found foods such as fruits, vegetables, whole grains, and beans. A high-fiber diet can have many health benefits. Your health care provider may recommend a high-fiber diet to help: ?Prevent constipation. Fiber can make your bowel movements more regular. ?Lower your cholesterol. ?Relieve the following conditions: ?Inflammation of veins in the anus (hemorrhoids). ?Inflammation of specific areas of the digestive tract (uncomplicated diverticulosis). ?A problem of the large intestine, also called the colon, that sometimes causes pain and diarrhea (irritable bowel syndrome, or IBS). ?Prevent overeating as  part of a weight-loss plan. ?Prevent heart disease, type 2 diabetes, and certain cancers. ?What are tips for following this plan? ?Reading food labels ? ?Check the nutrition facts label on food products for the amount of dietary fiber. Choose foods that have 5 grams of fiber or more per serving. ?The goals for recommended daily fiber intake include: ?Men (age 69 or younger): 34-38 g. ?Men (over age 1): 28-34 g. ?Women (age 18 or younger): 25-28 g. ?Women (over age 38): 22-25 g. ?Your daily fiber goal is _____________ g. ?Shopping ?Choose whole fruits and vegetables instead of processed forms, such as apple juice or applesauce. ?Choose a wide variety of high-fiber foods such as avocados, lentils, oats, and kidney beans. ?Read the nutrition facts label of the foods you choose. Be aware of foods with added fiber. These foods often have high sugar and sodium amounts per serving. ?Cooking ?Use whole-grain flour for baking and cooking. ?Cook with brown rice instead of white rice. ?Meal planning ?Start the day with a breakfast that is high in fiber, such as a cereal that contains 5 g of fiber or more per serving. ?Eat breads and cereals that are made with whole-grain flour instead of refined flour or white flour. ?Eat brown rice, bulgur wheat, or millet instead of white rice. ?Use beans in place of meat in soups, salads, and pasta dishes. ?Be sure that half  of the grains you eat each day are whole grains. ?General information ?You can get the recommended daily intake of dietary fiber by: ?Eating a variety of fruits, vegetables, grains, nuts, and beans. ?Taking a fiber supplement if you are not able to take in enough fiber in your diet. It is better to get fiber through food than from a supplement. ?Gradually increase how much fiber you consume. If you increase your intake of dietary fiber too quickly, you may have bloating, cramping, or gas. ?Drink plenty of water to help you digest fiber. ?Choose high-fiber snacks, such  as berries, raw vegetables, nuts, and popcorn. ?What foods should I eat? ?Fruits ?Berries. Pears. Apples. Oranges. Avocado. Prunes and raisins. Dried figs. ?Vegetables ?Sweet potatoes. Spinach. Kale.

## 2022-01-30 ENCOUNTER — Other Ambulatory Visit: Payer: Self-pay

## 2022-01-30 ENCOUNTER — Ambulatory Visit: Payer: 59 | Admitting: Infectious Disease

## 2022-01-30 LAB — IGE: IgE (Immunoglobulin E), Serum: 27 IU/mL (ref 6–495)

## 2022-02-03 ENCOUNTER — Other Ambulatory Visit: Payer: Self-pay

## 2022-02-04 ENCOUNTER — Other Ambulatory Visit: Payer: Self-pay

## 2022-02-04 ENCOUNTER — Telehealth: Payer: Self-pay | Admitting: Gastroenterology

## 2022-02-04 ENCOUNTER — Encounter: Payer: Self-pay | Admitting: Gastroenterology

## 2022-02-04 MED ORDER — PEG-3350/ELECTROLYTES 236 G PO SOLR
4000.0000 mL | Freq: Once | ORAL | 0 refills | Status: AC
  Filled 2022-02-04: qty 4000, 1d supply, fill #0

## 2022-02-04 NOTE — Telephone Encounter (Signed)
Patient's insurance does not cover bowel prep. Requesting a call back asap. ?

## 2022-02-04 NOTE — Progress Notes (Signed)
Bowel prep has been changed to [facility].  Instructions provided to patient.  Verbalized understanding.  Rx has been sent to [facility] ?

## 2022-02-05 ENCOUNTER — Ambulatory Visit: Payer: 59 | Admitting: Certified Registered"

## 2022-02-05 ENCOUNTER — Encounter: Payer: Self-pay | Admitting: Gastroenterology

## 2022-02-05 ENCOUNTER — Encounter
Admission: RE | Disposition: A | Discharge status: Home / Self Care | Payer: Self-pay | Source: Home / Self Care | Attending: Gastroenterology

## 2022-02-05 ENCOUNTER — Ambulatory Visit
Admission: RE | Admit: 2022-02-05 | Discharge: 2022-02-05 | Disposition: A | Discharge status: Home / Self Care | Payer: 59 | Attending: Gastroenterology | Admitting: Gastroenterology

## 2022-02-05 ENCOUNTER — Other Ambulatory Visit: Payer: Self-pay

## 2022-02-05 DIAGNOSIS — K640 First degree hemorrhoids: Secondary | ICD-10-CM | POA: Insufficient documentation

## 2022-02-05 DIAGNOSIS — E119 Type 2 diabetes mellitus without complications: Secondary | ICD-10-CM | POA: Diagnosis not present

## 2022-02-05 DIAGNOSIS — K2289 Other specified disease of esophagus: Secondary | ICD-10-CM | POA: Diagnosis not present

## 2022-02-05 DIAGNOSIS — F4310 Post-traumatic stress disorder, unspecified: Secondary | ICD-10-CM | POA: Diagnosis not present

## 2022-02-05 DIAGNOSIS — F319 Bipolar disorder, unspecified: Secondary | ICD-10-CM | POA: Diagnosis not present

## 2022-02-05 DIAGNOSIS — J45909 Unspecified asthma, uncomplicated: Secondary | ICD-10-CM | POA: Insufficient documentation

## 2022-02-05 DIAGNOSIS — R1310 Dysphagia, unspecified: Secondary | ICD-10-CM | POA: Diagnosis not present

## 2022-02-05 DIAGNOSIS — F418 Other specified anxiety disorders: Secondary | ICD-10-CM | POA: Insufficient documentation

## 2022-02-05 DIAGNOSIS — Z1211 Encounter for screening for malignant neoplasm of colon: Secondary | ICD-10-CM | POA: Insufficient documentation

## 2022-02-05 DIAGNOSIS — K2281 Esophageal polyp: Secondary | ICD-10-CM | POA: Diagnosis not present

## 2022-02-05 DIAGNOSIS — K621 Rectal polyp: Secondary | ICD-10-CM | POA: Diagnosis not present

## 2022-02-05 DIAGNOSIS — K219 Gastro-esophageal reflux disease without esophagitis: Secondary | ICD-10-CM | POA: Insufficient documentation

## 2022-02-05 DIAGNOSIS — D122 Benign neoplasm of ascending colon: Secondary | ICD-10-CM | POA: Diagnosis not present

## 2022-02-05 DIAGNOSIS — F17210 Nicotine dependence, cigarettes, uncomplicated: Secondary | ICD-10-CM | POA: Insufficient documentation

## 2022-02-05 DIAGNOSIS — E039 Hypothyroidism, unspecified: Secondary | ICD-10-CM | POA: Diagnosis not present

## 2022-02-05 DIAGNOSIS — K635 Polyp of colon: Secondary | ICD-10-CM | POA: Diagnosis not present

## 2022-02-05 HISTORY — PX: COLONOSCOPY WITH PROPOFOL: SHX5780

## 2022-02-05 HISTORY — PX: ESOPHAGOGASTRODUODENOSCOPY: SHX5428

## 2022-02-05 LAB — GLUCOSE, CAPILLARY: Glucose-Capillary: 110 mg/dL — ABNORMAL HIGH (ref 70–99)

## 2022-02-05 SURGERY — COLONOSCOPY WITH PROPOFOL
Anesthesia: General

## 2022-02-05 MED ORDER — LIDOCAINE HCL (PF) 2 % IJ SOLN
INTRAMUSCULAR | Status: AC
  Filled 2022-02-05: qty 5

## 2022-02-05 MED ORDER — SODIUM CHLORIDE 0.9 % IV SOLN: INTRAVENOUS | Status: DC | PRN

## 2022-02-05 MED ORDER — PROPOFOL 10 MG/ML IV BOLUS
INTRAVENOUS | Status: DC | PRN
  Administered 2022-02-05: 150 ug/kg/min via INTRAVENOUS
  Administered 2022-02-05: 100 mg via INTRAVENOUS

## 2022-02-05 MED ORDER — PROPOFOL 10 MG/ML IV BOLUS
INTRAVENOUS | Status: AC
  Filled 2022-02-05: qty 20

## 2022-02-05 MED ORDER — LIDOCAINE HCL (CARDIAC) PF 100 MG/5ML IV SOSY
PREFILLED_SYRINGE | INTRAVENOUS | Status: DC | PRN
  Administered 2022-02-05: 100 mg via INTRAVENOUS

## 2022-02-05 NOTE — Anesthesia Preprocedure Evaluation (Signed)
Anesthesia Evaluation  ?Patient identified by MRN, date of birth, ID band ?Patient awake ? ? ? ?Reviewed: ?Allergy & Precautions, H&P , NPO status , Patient's Chart, lab work & pertinent test results, reviewed documented beta blocker date and time  ? ?Airway ?Mallampati: II ? ?TM Distance: >3 FB ?Neck ROM: full ? ? ? Dental ? ?(+) Dental Advidsory Given, Caps, Partial Lower, Missing, Teeth Intact ?  ?Pulmonary ?neg shortness of breath, asthma , neg sleep apnea, neg COPD, Recent URI , Resolved, Current Smoker and Patient abstained from smoking.,  ?  ?Pulmonary exam normal ?breath sounds clear to auscultation ? ? ? ? ? ? Cardiovascular ?Exercise Tolerance: Good ?hypertension, (-) angina(-) Past MI and (-) Cardiac Stents Normal cardiovascular exam(-) dysrhythmias (-) Valvular Problems/Murmurs ?Rhythm:regular Rate:Normal ? ? ?  ?Neuro/Psych ?PSYCHIATRIC DISORDERS Anxiety Depression Bipolar Disorder negative neurological ROS ?   ? GI/Hepatic ?Neg liver ROS, GERD  ,  ?Endo/Other  ?diabetesHypothyroidism  ? Renal/GU ?negative Renal ROS  ?negative genitourinary ?  ?Musculoskeletal ? ? Abdominal ?  ?Peds ? 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 ? Reproductive/Obstetrics ?negative OB ROS ? ?  ? ? ? ? ? ? ? ? ? ? ? ? ? ?  ?  ? ? ? ? ? ? ? ? ?Anesthesia Physical ?Anesthesia Plan ? ?ASA: 3 ? ?Anesthesia Plan: General  ? ?Post-op Pain Management:   ? ?Induction: Intravenous ? ?PONV Risk Score and Plan: 2 and Propofol  infusion and TIVA ? ?Airway Management Planned: Natural Airway and Nasal Cannula ? ?Additional Equipment:  ? ?Intra-op Plan:  ? ?Post-operative Plan:  ? ?Informed Consent: I have reviewed the patients History and Physical, chart, labs and discussed the procedure including the risks, benefits and alternatives for the proposed anesthesia with the patient or authorized representative who has indicated his/her understanding and acceptance.  ? ? ? ?Dental Advisory Given ? ?Plan Discussed with: Anesthesiologist, CRNA and Surgeon ? ?Anesthesia Plan Comments:   ? ? ? ? ? ? ?Anesthesia Quick Evaluation ? ?

## 2022-02-05 NOTE — Op Note (Signed)
[facility] ?Gastroenterology ?Patient Name: Kathleen Carr ?Procedure Date: 02/05/2022 1:21 PM ?MRN: [id number] ?Account #: [id number] ?Date of Birth: 03/03/1971 ?Admit Type: Outpatient ?Age: 51 ?Room: [facility] ENDO ROOM 3 ?Gender: Female ?Note Status: Finalized ?Instrument Name: Colonoscope [id number] ?Procedure:             Colonoscopy ?Indications:           Screening for colorectal malignant neoplasm ?Providers:             [name] MD, MD ?Medicines:             Monitored Anesthesia Care ?Complications:         No immediate complications. ?Procedure:             Pre-Anesthesia Assessment: ?                       - Prior to the procedure, a History and Physical was  ?                       performed, and patient medications, allergies and  ?                       sensitivities were reviewed. The patient's tolerance  ?                       of previous anesthesia was reviewed. ?                       - The risks and benefits of the procedure and the  ?                       sedation options and risks were discussed with the  ?                       patient. All questions were answered and informed  ?                       consent was obtained. ?                       - ASA Grade Assessment: II - A patient with mild  ?                       systemic disease. ?                       After obtaining informed consent, the colonoscope was  ?                       passed under direct vision. Throughout the procedure,  ?                       the patient's blood pressure, pulse, and oxygen  ?                       saturations were monitored continuously. The  ?                       Colonoscope was introduced through the anus and  ?  advanced to the the cecum, identified by the  ?                       appendiceal orifice. The colonoscopy was performed  ?                       with ease. The patient tolerated the procedure well.  ?                       The quality of the bowel preparation  was excellent. ?Findings: ?     The perianal and digital rectal examinations were normal. ?     Two sessile polyps were found in the rectum and sigmoid colon. The  ?     polyps were 5 to 6 mm in size. These polyps were removed with a cold  ?     snare. Resection and retrieval were complete. ?     Two sessile polyps were found in the ascending colon. The polyps were 4  ?     to 10 mm in size. These polyps were removed with a cold snare. Resection  ?     and retrieval were complete. ?     Non-bleeding internal hemorrhoids were found during retroflexion. The  ?     hemorrhoids were medium-sized and Grade I (internal hemorrhoids that do  ?     not prolapse). ?     No additional abnormalities were found on retroflexion. ?     The exam was otherwise without abnormality on direct and retroflexion  ?     views. ?Impression:            - Two 5 to 6 mm polyps in the rectum and in the  ?                       sigmoid colon, removed with a cold snare. Resected and  ?                       retrieved. ?                       - Two 4 to 10 mm polyps in the ascending colon,  ?                       removed with a cold snare. Resected and retrieved. ?                       - Non-bleeding internal hemorrhoids. ?                       - The examination was otherwise normal on direct and  ?                       retroflexion views. ?Recommendation:        - Discharge patient to home (with escort). ?                       - Resume previous diet. ?                       - Continue present medications. ?                       -  Await pathology results. ?                       - Repeat colonoscopy for surveillance based on  ?                       pathology results. ?                       - Return to my office as previously scheduled. ?Procedure Code(s):     --- Professional --- ?                       [phone], Colonoscopy, flexible; with removal of  ?                       tumor(s), polyp(s), or other lesion(s) by snare  ?                        technique ?Diagnosis Code(s):     --- Professional --- ?                       Z12.11, Encounter for screening for malignant neoplasm  ?                       of colon ?                       K62.1, Rectal polyp ?                       K63.5, Polyp of colon ?                       K64.0, First degree hemorrhoids ?CPT copyright 2019 American Medical Association. All rights reserved. ?The codes documented in this report are preliminary and upon coder review may  ?be revised to meet current compliance requirements. ?[name], MD ?[name] MD, MD ?02/05/2022 2:04:52 PM ?This report has been signed electronically. ?Number of Addenda: 0 ?Note Initiated On: 02/05/2022 1:21 PM ?Scope Withdrawal Time: 0 hours 15 minutes 7 seconds  ?Total Procedure Duration: 0 hours 19 minutes 34 seconds  ?Estimated Blood Loss:  Estimated blood loss: none. ?     [facility] ?

## 2022-02-05 NOTE — Op Note (Signed)
[facility] ?Gastroenterology ?Patient Name: Kathleen Carr ?Procedure Date: 02/05/2022 1:21 PM ?MRN: [id number] ?Account #: [id number] ?Date of Birth: [birth year] ?Admit Type: Outpatient ?Age: 51 ?Room: [facility] ENDO ROOM 3 ?Gender: Female ?Note Status: Finalized ?Instrument Name: Upper Endoscope [id number] ?Procedure:             Upper GI endoscopy ?Indications:           Dysphagia ?Providers:             [name] MD, MD ?Medicines:             Monitored Anesthesia Care ?Complications:         No immediate complications. ?Procedure:             Pre-Anesthesia Assessment: ?                       - Prior to the procedure, a History and Physical was  ?                       performed, and patient medications, allergies and  ?                       sensitivities were reviewed. The patient's tolerance  ?                       of previous anesthesia was reviewed. ?                       - The risks and benefits of the procedure and the  ?                       sedation options and risks were discussed with the  ?                       patient. All questions were answered and informed  ?                       consent was obtained. ?                       - ASA Grade Assessment: II - A patient with mild  ?                       systemic disease. ?                       After obtaining informed consent, the endoscope was  ?                       passed under direct vision. Throughout the procedure,  ?                       the patient's blood pressure, pulse, and oxygen  ?                       saturations were monitored continuously. The Endoscope  ?                       was introduced through the mouth, and advanced to the  ?  third part of duodenum. The upper GI endoscopy was  ?                       accomplished with ease. The patient tolerated the  ?                       procedure well. ?Findings: ?     The examined duodenum was normal. ?     The stomach was normal. ?     The cardia and  gastric fundus were normal on retroflexion. ?     A single 4 mm polyp with no bleeding was found in the lower third of the  ?     esophagus. The polyp was removed with a cold biopsy forceps. Resection  ?     and retrieval were complete. ?     Normal mucosa was found in the entire esophagus. Biopsies were taken  ?     with a cold forceps for histology. ?Impression:            - Normal examined duodenum. ?                       - Normal stomach. ?                       - Esophageal polyp(s) were found. Resected and  ?                       retrieved. ?                       - Normal mucosa was found in the entire esophagus.  ?                       Biopsied. ?Recommendation:        - Await pathology results. ?                       - Perform a colonoscopy today. ?Procedure Code(s):     --- Professional --- ?                       [phone], Esophagogastroduodenoscopy, flexible,  ?                       transoral; with biopsy, single or multiple ?Diagnosis Code(s):     --- Professional --- ?                       K22.8, Other specified diseases of esophagus ?                       R13.10, Dysphagia, unspecified ?CPT copyright 2019 American Medical Association. All rights reserved. ?The codes documented in this report are preliminary and upon coder review may  ?be revised to meet current compliance requirements. ?[name], MD ?[name] MD, MD ?02/05/2022 1:40:22 PM ?This report has been signed electronically. ?Number of Addenda: 0 ?Note Initiated On: 02/05/2022 1:21 PM ?Estimated Blood Loss:  Estimated blood loss: none. ?     [facility] ?

## 2022-02-05 NOTE — H&P (Signed)
? ? ? ?[name], MD ?[address] [place], [place], [place], [address] ?[address], [place], [place], [place], [address] ?Phone: [phone]  ?Fax: [phone] ? ?Primary Care Physician:  Doreen Beam, FNP ? ? ?Pre-Procedure History & Physical: ?HPI:  Kathleen Carr is a 51 y.o. female is here for an endoscopy and colonoscopy  ?  ?Past Medical History:  ?Diagnosis Date  ? Allergy   ? Anxiety   ? Arthritis   ? Asthma   ? Bipolar depression ([place])   ? Cat bite 01/14/2022  ? Depression   ? Diabetes mellitus without complication ([place])   ? GERD (gastroesophageal reflux disease)   ? Hypertension   ? 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 PTSD (post-traumatic stress disorder)   ? PTSD (post-traumatic stress disorder)   ? Thyroid disease   ? ? ?Past Surgical History:  ?Procedure Laterality Date  ? ABDOMINAL HYSTERECTOMY    ? CHOLECYSTECTOMY    ? KNEE ARTHROSCOPY Bilateral   ? TONSILLECTOMY    ? ? ?Prior to Admission medications   ?Medication Sig Start Date End Date Taking? Authorizing Provider  ?ALPRAZolam (XANAX) 0.25 MG tablet Take 1-2 tablets (0.25-0.5 mg total) by mouth 2 (two) times daily as needed for anxiety (will cause drowsiness.). 12/25/21  Yes Flinchum, Kelby Aline, FNP  ?aspirin EC 81 MG tablet Take 81 mg by mouth daily. Swallow whole.   Yes [provider]  ?cholecalciferol (VITAMIN D3) 25 MCG (1000 UNIT) tablet Take 1,000 Units by mouth daily.   Yes [provider]  ?cyclobenzaprine (FLEXERIL) 10 MG tablet Take 1 tablet (10 mg total) by mouth 3 (three) times daily as needed for muscle spasms (will cause drowsiness.). 12/25/21  Yes Flinchum, Kelby Aline, FNP  ?levothyroxine (SYNTHROID) 50 MCG tablet TAKE 1 TABLET BY MOUTH DAILY BEFORE BREAKFAST. 08/20/21 08/20/22 Yes Crecencio Mc, MD  ?loratadine (CLARITIN) 10 MG tablet Take 10 mg by mouth daily.   Yes [provider]  ?metFORMIN (GLUCOPHAGE) 500 MG tablet Take 1 tablet (500 mg total) by mouth 2 (two) times daily with a meal.  11/19/21  Yes Flinchum, Kelby Aline, FNP  ?oxybutynin (DITROPAN XL) 10 MG 24 hr tablet Take 1 tablet (10 mg total) by mouth at bedtime. 08/20/21  Yes Crecencio Mc, MD  ?pantoprazole (PROTONIX) 40 MG tablet Take 1 tablet (40 mg total) by mouth daily. 08/20/21 08/20/22 Yes Crecencio Mc, MD  ?QUEtiapine (SEROQUEL) 300 MG tablet TAKE 1 TABLET (300 MG TOTAL) BY MOUTH AT BEDTIME. 11/14/21 11/14/22 Yes Flinchum, Kelby Aline, FNP  ?rosuvastatin (CRESTOR) 5 MG tablet TAKE 1 TABLET BY MOUTH DAILY. 08/20/21 08/20/22 Yes Crecencio Mc, MD  ?sertraline (ZOLOFT) 100 MG tablet TAKE 1 TABLET BY MOUTH DAILY 11/14/21 11/14/22 Yes Flinchum, Kelby Aline, FNP  ?traMADol (ULTRAM) 50 MG tablet Take 50 mg by mouth as needed.   Yes [provider]  ?albuterol (PROAIR HFA) 108 (90 Base) MCG/ACT inhaler Inhale 1-2 puffs into the lungs every 6 (six) hours as needed for wheezing. 12/31/21   Flinchum, Kelby Aline, FNP  ?PEG 3350-KCl-NaBcb-NaCl-NaSulf (PEG-3350/ELECTROLYTES) 236 g SOLR Take 4,000 mLs by mouth once for 1 dose. 02/04/22 02/05/22  [name], MD  ?promethazine-dextromethorphan (PROMETHAZINE-DM) 6.25-15 MG/5ML syrup Take 5 mLs by mouth at bedtime as needed for cough. 01/09/22   Flinchum, Kelby Aline, FNP  ?zolpidem (AMBIEN) 5 MG tablet TAKE 1 TABLET (5 MG TOTAL) BY MOUTH AT BEDTIME AS NEEDED FOR SLEEP. 11/19/21 05/18/22  Flinchum, Kelby Aline, FNP  ?sitaGLIPtin (  JANUVIA) 100 MG tablet Take 1 tablet (100 mg total) by mouth daily. 08/08/20 01/22/21  Flinchum, Kelby Aline, FNP  ? ? ?Allergies as of 01/29/2022 - Review Complete 01/29/2022  ?Allergen Reaction Noted  ? Dust mite mixed allergen ext [mite (d. farinae)]  08/01/2020  ? Other Hives 08/01/2020  ? Sulfa antibiotics Hives 06/24/2020  ? ? ?Family History  ?Problem Relation Age of Onset  ? Hypertension Mother   ? Thyroid disease Mother   ? Cancer Father 32  ?     carcinoid, right lung  ? Ulcerative colitis Father   ? Glaucoma Maternal Grandmother   ? Renal Disease Maternal Grandfather    ? Diabetes Maternal Grandfather   ?     Type2  ? Stroke Paternal Grandfather   ? Hypertension Paternal Grandfather   ? ? ?Social History  ? ?Socioeconomic History  ? Marital status: Married  ?  Spouse name: Not on file  ? Number of children: Not on file  ? Years of education: Not on file  ? Highest education level: Not on file  ?Occupational History  ? Not on file  ?Tobacco Use  ? Smoking status: Some Days  ?  Packs/day: 0.50  ?  Years: 42.00  ?  Pack years: 21.00  ?  Types: Cigarettes  ?  Last attempt to quit: 05/25/2020  ?  Years since quitting: 1.7  ? Smokeless tobacco: Never  ? Tobacco comments:  ?  0.5 PPD 01/28/2022   ?  Smoked since she was 51 years old  ?Vaping Use  ? Vaping Use: Some days  ?Substance and Sexual Activity  ? Alcohol use: Not Currently  ? Drug use: Not Currently  ? Sexual activity: Not Currently  ?  Partners: Female  ?  Comment: married to wife  ?Other Topics Concern  ? Not on file  ?Social History Narrative  ? Not on file  ? ?Social Determinants of Health  ? ?Financial Resource Strain: Not on file  ?Food Insecurity: Not on file  ?Transportation Needs: Not on file  ?Physical Activity: Not on file  ?Stress: Not on file  ?Social Connections: Not on file  ?Intimate Partner Violence: Not on file  ? ? ?Review of Systems: ?See HPI, otherwise negative ROS ? ?Physical Exam: ?BP 110/71   Pulse 78   Temp (!) 96.8 ?F (36 ?C) (Temporal)   Resp 18   Ht '5\' 4"'$  (1.626 m)   Wt 77.1 kg   SpO2 98%   BMI 29.18 kg/m?  ?General:   Alert,  pleasant and cooperative in NAD ?Head:  Normocephalic and atraumatic. ?Neck:  Supple; no masses or thyromegaly. ?Lungs:  Clear throughout to auscultation, normal respiratory effort.    ?Heart:  +S1, +S2, Regular rate and rhythm, No edema. ?Abdomen:  Soft, nontender and nondistended. Normal bowel sounds, without guarding, and without rebound.   ?Neurologic:  Alert and  oriented x4;  grossly normal neurologically. ? ?Impression/Plan: ?Kathleen Carr is here for an endoscopy and  colonoscopy  to be performed for  evaluation of dysphagia and colon cancer screening  ?   ?Risks, benefits, limitations, and alternatives regarding endoscopy have been reviewed with the patient.  Questions have been answered.  All parties agreeable. ? ? ?[name], MD  02/05/2022, 1:21 PM ? ?

## 2022-02-05 NOTE — Transfer of Care (Signed)
Immediate Anesthesia Transfer of Care Note ? ?Patient: Kathleen Carr ? ?Procedure(s) Performed: COLONOSCOPY WITH PROPOFOL ?ESOPHAGOGASTRODUODENOSCOPY (EGD) ? ?Patient Location: PACU ? ?Anesthesia Type:General ? ?Level of Consciousness: awake ? ?Airway & Oxygen Therapy: Patient Spontanous Breathing ? ?Post-op Assessment: Report given to RN and Post -op Vital signs reviewed and stable ? ?Post vital signs: Reviewed and stable ? ?Last Vitals:  ?Vitals Value Taken Time  ?BP 100/64 1405  ?Temp 36 1405  ?Pulse 72 1405  ?Resp 16 1405  ?SpO2 97 1405  ? ? ?Last Pain:  ?Vitals:  ? 02/05/22 1315  ?TempSrc: Temporal  ?PainSc: 0-No pain  ?   ? ?  ? ?Complications: No notable events documented. ?

## 2022-02-05 NOTE — H&P (Signed)
? ? ? ?[name], MD ?[address] [place], [place], [place], [address] ?[address], [place], [place], [place], [address] ?Phone: [phone]  ?Fax: [phone] ? ?Primary Care Physician:  Doreen Beam, FNP ? ? ?Pre-Procedure History & Physical: ?HPI:  Kathleen Carr is a 51 y.o. female is here for an endoscopy and colonoscopy  ?  ?Past Medical History:  ?Diagnosis Date  ? Allergy   ? Anxiety   ? Arthritis   ? Asthma   ? Bipolar depression ([place])   ? Cat bite 01/14/2022  ? Depression   ? Diabetes mellitus without complication ([place])   ? GERD (gastroesophageal reflux disease)   ? Hypertension   ? 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 PTSD (post-traumatic stress disorder)   ? PTSD (post-traumatic stress disorder)   ? Thyroid disease   ? ? ?Past Surgical History:  ?Procedure Laterality Date  ? ABDOMINAL HYSTERECTOMY    ? CHOLECYSTECTOMY    ? KNEE ARTHROSCOPY Bilateral   ? TONSILLECTOMY    ? ? ?Prior to Admission medications   ?Medication Sig Start Date End Date Taking? Authorizing Provider  ?albuterol (PROAIR HFA) 108 (90 Base) MCG/ACT inhaler Inhale 1-2 puffs into the lungs every 6 (six) hours as needed for wheezing. 12/31/21   Flinchum, Kelby Aline, FNP  ?ALPRAZolam (XANAX) 0.25 MG tablet Take 1-2 tablets (0.25-0.5 mg total) by mouth 2 (two) times daily as needed for anxiety (will cause drowsiness.). 12/25/21   Flinchum, Kelby Aline, FNP  ?aspirin EC 81 MG tablet Take 81 mg by mouth daily. Swallow whole.    [provider]  ?cholecalciferol (VITAMIN D3) 25 MCG (1000 UNIT) tablet Take 1,000 Units by mouth daily.    [provider]  ?cyclobenzaprine (FLEXERIL) 10 MG tablet Take 1 tablet (10 mg total) by mouth 3 (three) times daily as needed for muscle spasms (will cause drowsiness.). 12/25/21   Flinchum, Kelby Aline, FNP  ?levothyroxine (SYNTHROID) 50 MCG tablet TAKE 1 TABLET BY MOUTH DAILY BEFORE BREAKFAST. 08/20/21 08/20/22  Crecencio Mc, MD  ?loratadine (CLARITIN) 10 MG tablet Take 10 mg by  mouth daily.    [provider]  ?metFORMIN (GLUCOPHAGE) 500 MG tablet Take 1 tablet (500 mg total) by mouth 2 (two) times daily with a meal. 11/19/21   Flinchum, Kelby Aline, FNP  ?oxybutynin (DITROPAN XL) 10 MG 24 hr tablet Take 1 tablet (10 mg total) by mouth at bedtime. 08/20/21   Crecencio Mc, MD  ?pantoprazole (PROTONIX) 40 MG tablet Take 1 tablet (40 mg total) by mouth daily. 08/20/21 08/20/22  Crecencio Mc, MD  ?PEG 3350-KCl-NaBcb-NaCl-NaSulf (PEG-3350/ELECTROLYTES) 236 g SOLR Take 4,000 mLs by mouth once for 1 dose. 02/04/22 02/05/22  [name], MD  ?promethazine-dextromethorphan (PROMETHAZINE-DM) 6.25-15 MG/5ML syrup Take 5 mLs by mouth at bedtime as needed for cough. 01/09/22   Flinchum, Kelby Aline, FNP  ?QUEtiapine (SEROQUEL) 300 MG tablet TAKE 1 TABLET (300 MG TOTAL) BY MOUTH AT BEDTIME. 11/14/21 11/14/22  Flinchum, Kelby Aline, FNP  ?rosuvastatin (CRESTOR) 5 MG tablet TAKE 1 TABLET BY MOUTH DAILY. 08/20/21 08/20/22  Crecencio Mc, MD  ?sertraline (ZOLOFT) 100 MG tablet TAKE 1 TABLET BY MOUTH DAILY 11/14/21 11/14/22  Flinchum, Kelby Aline, FNP  ?zolpidem (AMBIEN) 5 MG tablet TAKE 1 TABLET (5 MG TOTAL) BY MOUTH AT BEDTIME AS NEEDED FOR SLEEP. 11/19/21 05/18/22  Flinchum, Kelby Aline, FNP  ?sitaGLIPtin (JANUVIA) 100 MG tablet Take 1 tablet (100 mg total) by mouth daily. 08/08/20 01/22/21  Flinchum, Kelby Aline,  FNP  ? ? ?Allergies as of 01/29/2022 - Review Complete 01/29/2022  ?Allergen Reaction Noted  ? Dust mite mixed allergen ext [mite (d. farinae)]  08/01/2020  ? Other Hives 08/01/2020  ? Sulfa antibiotics Hives 06/24/2020  ? ? ?Family History  ?Problem Relation Age of Onset  ? Hypertension Mother   ? Thyroid disease Mother   ? Cancer Father 16  ?     carcinoid, right lung  ? Ulcerative colitis Father   ? Glaucoma Maternal Grandmother   ? Renal Disease Maternal Grandfather   ? Diabetes Maternal Grandfather   ?     Type2  ? Stroke Paternal Grandfather   ? Hypertension Paternal Grandfather    ? ? ?Social History  ? ?Socioeconomic History  ? Marital status: Married  ?  Spouse name: Not on file  ? Number of children: Not on file  ? Years of education: Not on file  ? Highest education level: Not on file  ?Occupational History  ? Not on file  ?Tobacco Use  ? Smoking status: Some Days  ?  Packs/day: 0.50  ?  Years: 42.00  ?  Pack years: 21.00  ?  Types: Cigarettes  ?  Last attempt to quit: 05/25/2020  ?  Years since quitting: 1.7  ? Smokeless tobacco: Never  ? Tobacco comments:  ?  0.5 PPD 01/28/2022   ?  Smoked since she was 50 years old  ?Vaping Use  ? Vaping Use: Some days  ?Substance and Sexual Activity  ? Alcohol use: Not Currently  ? Drug use: Not Currently  ? Sexual activity: Not Currently  ?  Partners: Female  ?  Comment: married to wife  ?Other Topics Concern  ? Not on file  ?Social History Narrative  ? Not on file  ? ?Social Determinants of Health  ? ?Financial Resource Strain: Not on file  ?Food Insecurity: Not on file  ?Transportation Needs: Not on file  ?Physical Activity: Not on file  ?Stress: Not on file  ?Social Connections: Not on file  ?Intimate Partner Violence: Not on file  ? ? ?Review of Systems: ?See HPI, otherwise negative ROS ? ?Physical Exam: ?There were no vitals taken for this visit. ?General:   Alert,  pleasant and cooperative in NAD ?Head:  Normocephalic and atraumatic. ?Neck:  Supple; no masses or thyromegaly. ?Lungs:  Clear throughout to auscultation, normal respiratory effort.    ?Heart:  +S1, +S2, Regular rate and rhythm, No edema. ?Abdomen:  Soft, nontender and nondistended. Normal bowel sounds, without guarding, and without rebound.   ?Neurologic:  Alert and  oriented x4;  grossly normal neurologically. ? ?Impression/Plan: ?Kathleen Carr is here for an endoscopy and colonoscopy  to be performed for  evaluation of dysphagia and colon cancer screening  ?   ?Risks, benefits, limitations, and alternatives regarding endoscopy have been reviewed with the patient.  Questions have been  answered.  All parties agreeable. ? ? ?[name], MD  02/05/2022, 1:00 PM ? ?

## 2022-02-06 ENCOUNTER — Encounter: Payer: Self-pay | Admitting: Gastroenterology

## 2022-02-06 ENCOUNTER — Ambulatory Visit: Payer: 59

## 2022-02-06 ENCOUNTER — Other Ambulatory Visit: Payer: Self-pay

## 2022-02-06 NOTE — Telephone Encounter (Signed)
Kathleen Carr changed Bowel prep on 04//11/23 ?

## 2022-02-06 NOTE — Anesthesia Postprocedure Evaluation (Signed)
Anesthesia Post Note ? ?Patient: Kathleen Carr ? ?Procedure(s) Performed: COLONOSCOPY WITH PROPOFOL ?ESOPHAGOGASTRODUODENOSCOPY (EGD) ? ?Patient location during evaluation: PACU ?Anesthesia Type: General ?Level of consciousness: awake and alert ?Pain management: pain level controlled ?Vital Signs Assessment: post-procedure vital signs reviewed and stable ?Respiratory status: spontaneous breathing, nonlabored ventilation and respiratory function stable ?Cardiovascular status: blood pressure returned to baseline and stable ?Postop Assessment: no apparent nausea or vomiting ?Anesthetic complications: no ? ? ?No notable events documented. ? ? ?Last Vitals:  ?Vitals:  ? 02/05/22 1414 02/05/22 1424  ?BP: 110/70 104/66  ?Pulse: 71 71  ?Resp: 15 19  ?Temp:    ?SpO2: 95% 99%  ?  ?Last Pain:  ?Vitals:  ? 02/05/22 1424  ?TempSrc:   ?PainSc: 0-No pain  ? ? ?  ?  ?  ?  ?  ?  ? ?[place] Ouch ? ? ? ? ?

## 2022-02-07 ENCOUNTER — Other Ambulatory Visit: Payer: Self-pay

## 2022-02-07 ENCOUNTER — Other Ambulatory Visit (HOSPITAL_COMMUNITY): Payer: Self-pay | Admitting: Neurosurgery

## 2022-02-07 ENCOUNTER — Other Ambulatory Visit: Payer: Self-pay | Admitting: Neurosurgery

## 2022-02-07 DIAGNOSIS — M5416 Radiculopathy, lumbar region: Secondary | ICD-10-CM

## 2022-02-07 DIAGNOSIS — G8929 Other chronic pain: Secondary | ICD-10-CM

## 2022-02-07 DIAGNOSIS — Z9889 Other specified postprocedural states: Secondary | ICD-10-CM

## 2022-02-07 LAB — SURGICAL PATHOLOGY

## 2022-02-10 ENCOUNTER — Other Ambulatory Visit: Payer: Self-pay | Admitting: *Deleted

## 2022-02-10 ENCOUNTER — Other Ambulatory Visit: Payer: Self-pay | Admitting: Internal Medicine

## 2022-02-10 ENCOUNTER — Other Ambulatory Visit: Payer: Self-pay

## 2022-02-10 DIAGNOSIS — R062 Wheezing: Secondary | ICD-10-CM

## 2022-02-10 DIAGNOSIS — D72820 Lymphocytosis (symptomatic): Secondary | ICD-10-CM

## 2022-02-10 NOTE — Telephone Encounter (Signed)
Pt called in requesting refill on medications (traMADol (ULTRAM) 50 MG tablet), (cyclobenzaprine (FLEXERIL) 10 MG tablet), (sertraline (ZOLOFT) 100 MG tablet), and (ALPRAZolam (XANAX) 0.25 MG tablet).... Pt stated that she will be starting TOC with Eugenia Pancoast in June... Pt stated the she just need a refill on medication until her appt... Pt requesting callback  ? ?

## 2022-02-11 ENCOUNTER — Ambulatory Visit: Payer: 59 | Admitting: Oncology

## 2022-02-11 ENCOUNTER — Other Ambulatory Visit: Payer: Self-pay | Admitting: Internal Medicine

## 2022-02-11 ENCOUNTER — Other Ambulatory Visit: Payer: Medicaid Other

## 2022-02-11 ENCOUNTER — Inpatient Hospital Stay: Payer: 59

## 2022-02-11 ENCOUNTER — Other Ambulatory Visit: Payer: Self-pay

## 2022-02-11 ENCOUNTER — Inpatient Hospital Stay: Payer: 59 | Admitting: Oncology

## 2022-02-11 ENCOUNTER — Telehealth: Payer: Self-pay | Admitting: Oncology

## 2022-02-11 MED FILL — Alprazolam Tab 0.25 MG: ORAL | 8 days supply | Qty: 30 | Fill #0 | Status: AC

## 2022-02-11 MED FILL — Cyclobenzaprine HCl Tab 10 MG: ORAL | 10 days supply | Qty: 30 | Fill #0 | Status: AC

## 2022-02-11 MED FILL — Tramadol HCl Tab 50 MG: ORAL | 30 days supply | Qty: 60 | Fill #0 | Status: AC

## 2022-02-11 NOTE — Telephone Encounter (Signed)
Next OV 04/02/22 with Eugenia Pancoast, NP  ?Last OV 01/09/22  ?Last refilled 12/25/21 ?

## 2022-02-11 NOTE — Telephone Encounter (Signed)
Pt called in to cancel appts, no longer needed KJ  ?

## 2022-02-12 ENCOUNTER — Other Ambulatory Visit: Payer: Self-pay

## 2022-02-13 ENCOUNTER — Other Ambulatory Visit: Payer: Self-pay | Admitting: Internal Medicine

## 2022-02-13 ENCOUNTER — Other Ambulatory Visit: Payer: Self-pay

## 2022-02-14 ENCOUNTER — Other Ambulatory Visit: Payer: Self-pay | Admitting: Internal Medicine

## 2022-02-14 ENCOUNTER — Other Ambulatory Visit: Payer: Self-pay

## 2022-02-17 ENCOUNTER — Other Ambulatory Visit: Payer: Self-pay

## 2022-02-17 MED FILL — Sertraline HCl Tab 100 MG: ORAL | 90 days supply | Qty: 90 | Fill #0 | Status: AC

## 2022-02-17 MED FILL — Oxybutynin Chloride Tab ER 24HR 10 MG: ORAL | 90 days supply | Qty: 90 | Fill #0 | Status: AC

## 2022-02-17 MED FILL — Rosuvastatin Calcium Tab 5 MG: ORAL | 90 days supply | Qty: 90 | Fill #0 | Status: AC

## 2022-02-17 NOTE — Telephone Encounter (Signed)
Okay to refill meds Sertraline, Cestor and Ditropan patient has an appointment to establish with new provider in June. ?

## 2022-02-21 ENCOUNTER — Ambulatory Visit
Admission: RE | Admit: 2022-02-21 | Discharge: 2022-02-21 | Disposition: A | Discharge status: Home / Self Care | Payer: 59 | Source: Ambulatory Visit | Attending: Neurosurgery | Admitting: Neurosurgery

## 2022-02-21 DIAGNOSIS — M5416 Radiculopathy, lumbar region: Secondary | ICD-10-CM | POA: Insufficient documentation

## 2022-02-21 DIAGNOSIS — M5441 Lumbago with sciatica, right side: Secondary | ICD-10-CM | POA: Diagnosis present

## 2022-02-21 DIAGNOSIS — Z9889 Other specified postprocedural states: Secondary | ICD-10-CM | POA: Diagnosis present

## 2022-02-21 DIAGNOSIS — G8929 Other chronic pain: Secondary | ICD-10-CM | POA: Insufficient documentation

## 2022-02-21 IMAGING — MR MR LUMBAR SPINE W/O CM
5 of 6 series · 36 of 48 positions shown · non-contrast
Comparison: MR lumbar [DATE]; X-ray lumbar [DATE].

CLINICAL DATA: Right-sided low back pain related to a fall on
[DATE]. History of surgery in [[facility]].

EXAM:
MRI LUMBAR SPINE WITHOUT CONTRAST
TECHNIQUE: Multiplanar, multisequence MR imaging of the lumbar spine was
performed. No intravenous contrast was administered.

[Series 5: T2 · sagittal · 4.0mm · 0.81mm/px · 6 of 17 slices shown (1 of 3)]
[im 1/17]
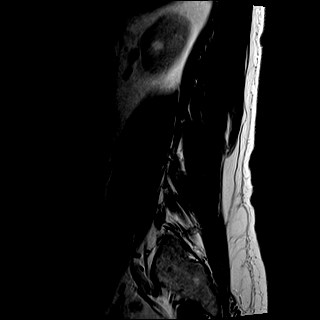
[im 4/17]
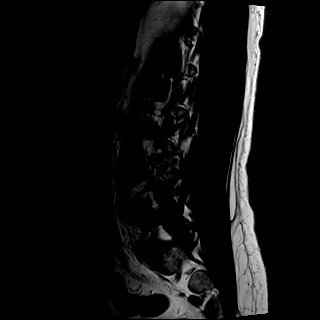
[im 7/17]
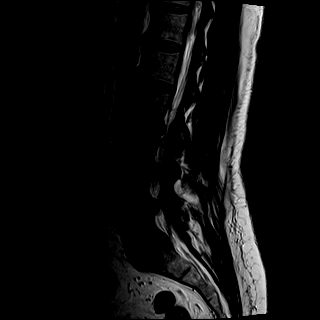
[im 10/17]
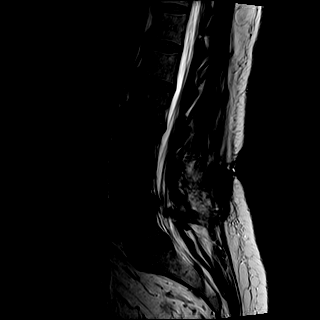
[im 13/17]
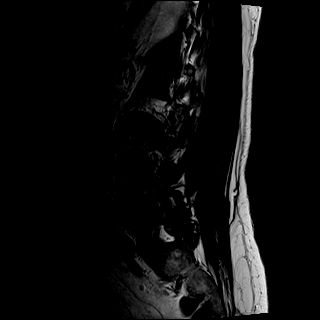
[im 17/17]
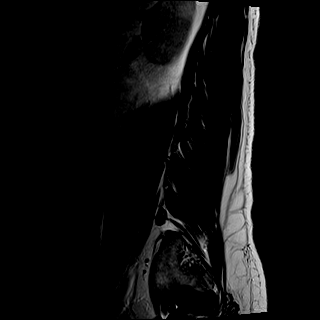

[Series 6: T1 · sagittal · 4.0mm · 0.81mm/px · 6 of 17 slices shown (1 of 2)]
[im 1/17]
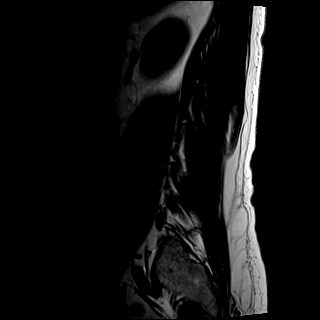
[im 4/17]
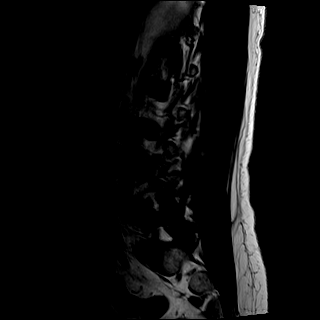
[im 7/17]
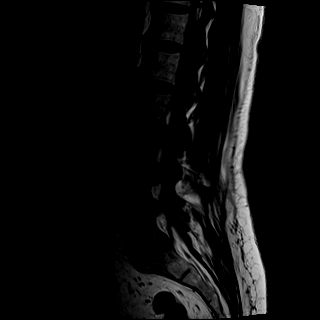
[im 10/17]
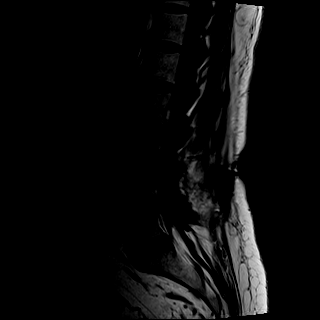
[im 13/17]
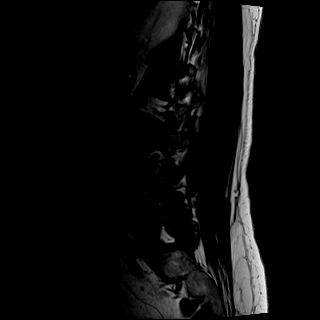
[im 17/17]
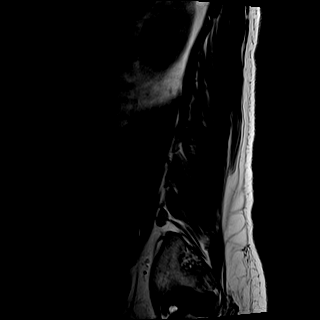

[Series 8: T2 · axial · 4.0mm · 0.78mm/px · z∈[-78,+149]mm · 8 of 32 slices shown (2 of 3)]
[im 1/32]
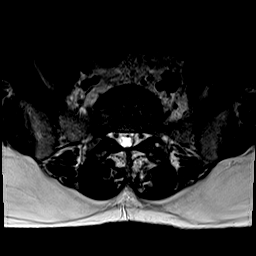
[im 4/32]
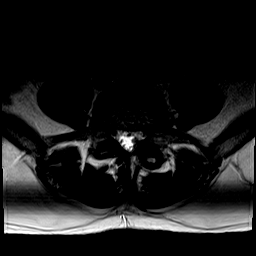
[im 11/32]
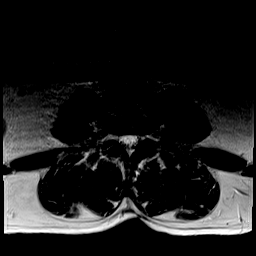
[im 14/32]
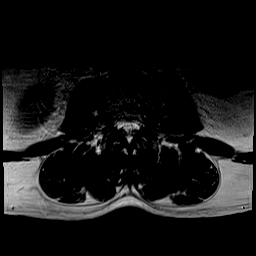
[im 18/32]
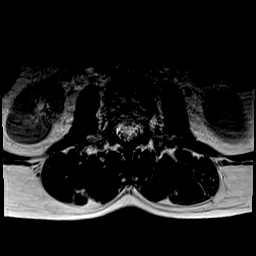
[im 21/32]
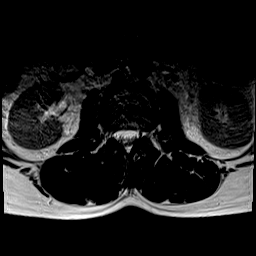
[im 28/32]
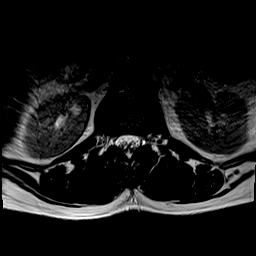
[im 32/32]
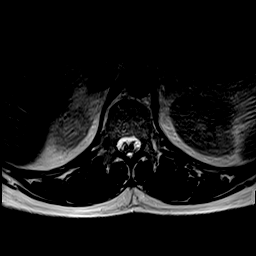

[Series 9: T1 · axial · 4.0mm · 0.39mm/px · z∈[-78,+149]mm · 8 of 32 slices shown (2 of 2)]
[im 1/32]
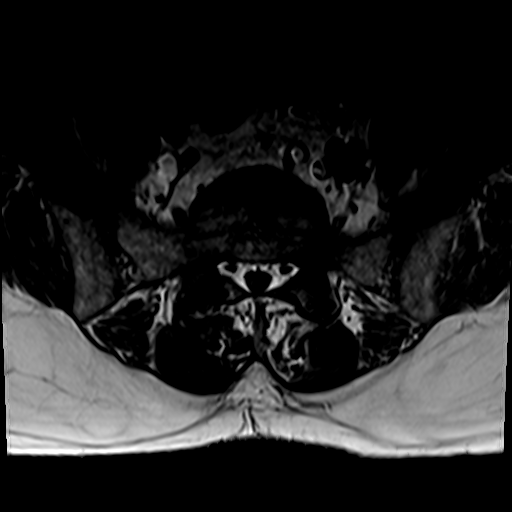
[im 4/32]
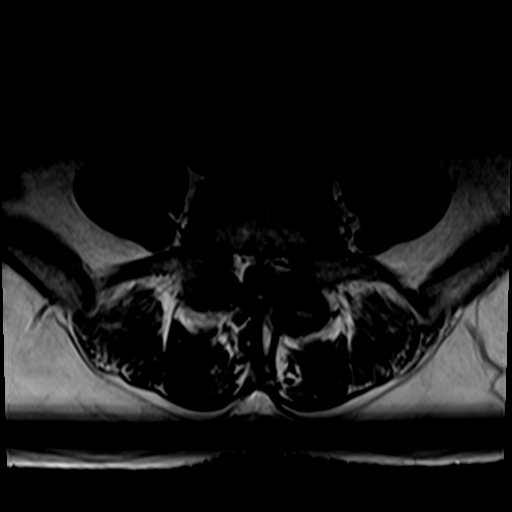
[im 11/32]
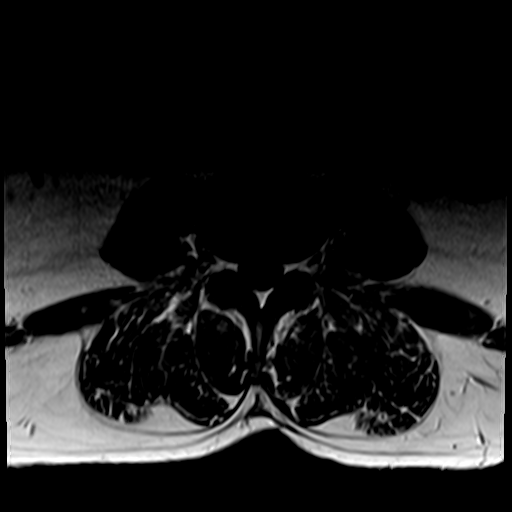
[im 14/32]
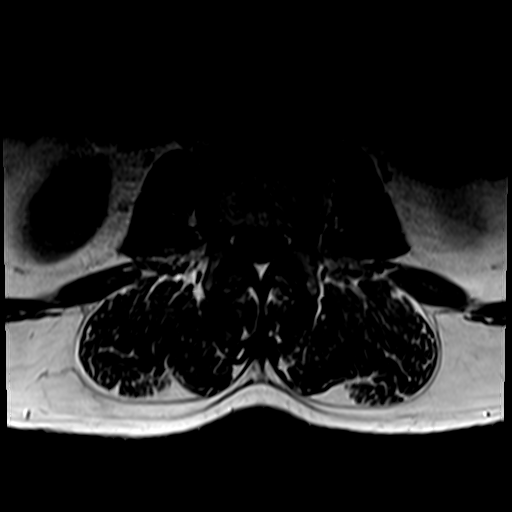
[im 18/32]
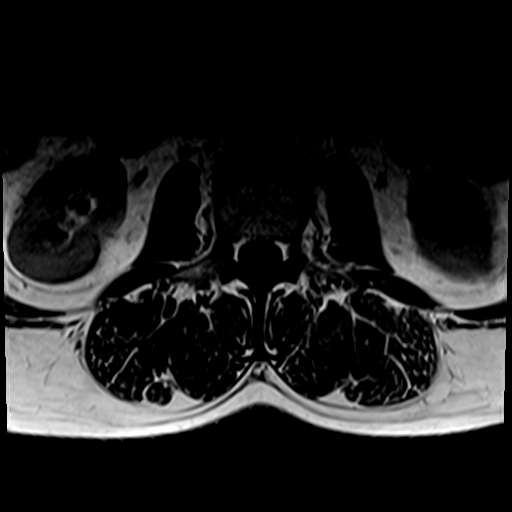
[im 21/32]
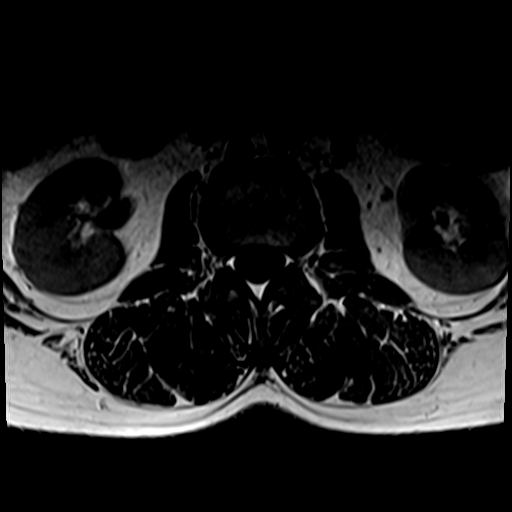
[im 28/32]
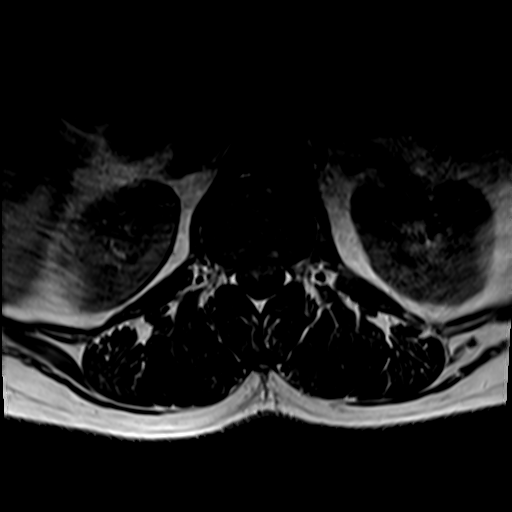
[im 32/32]
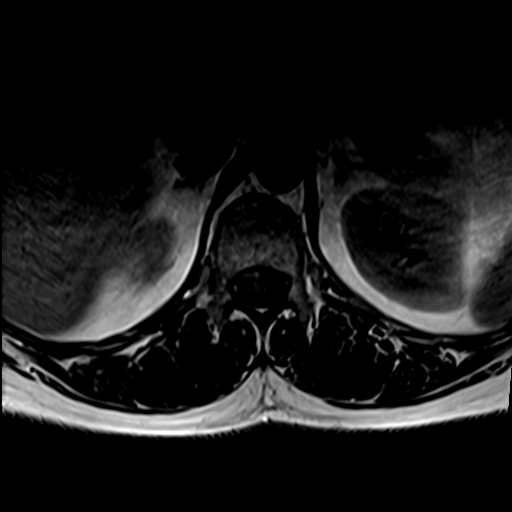

[Series 10: T2 · axial · 4.0mm · 0.78mm/px · z∈[-78,+149]mm · 8 of 32 slices shown (3 of 3)]
[im 1/32]
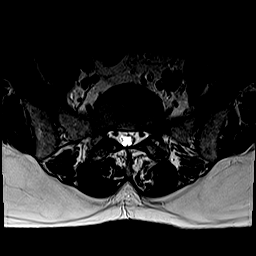
[im 4/32]
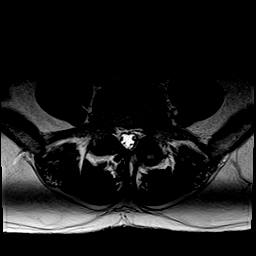
[im 11/32]
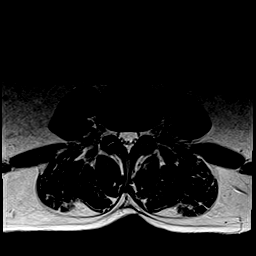
[im 14/32]
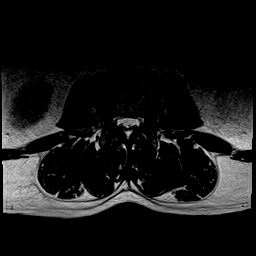
[im 18/32]
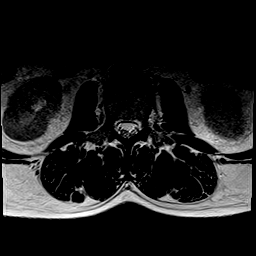
[im 21/32]
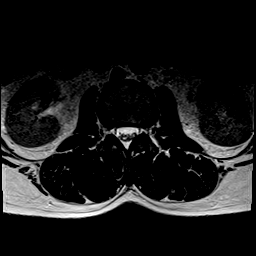
[im 28/32]
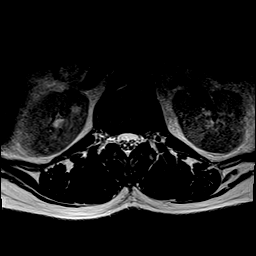
[im 32/32]
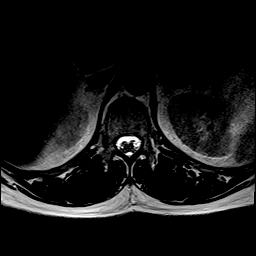

[36 of 48 positions shown; findings below may reference images not displayed]

FINDINGS: Segmentation:  Standard.

Alignment:  Physiologic.

Vertebrae: No fracture, evidence of discitis, or aggressive bone
lesion.

Conus medullaris and cauda equina: Conus extends to the T12 level.
Conus and cauda equina appear normal.

Paraspinal and other soft tissues: Tiny right renal cyst. No other
significant findings.

Disc levels:

T11-T12: No significant spinal canal or neural foraminal narrowing.

T12-L1: No significant spinal canal or neural foraminal narrowing.

L1-L2: Minimal disc bulging.  No significant stenosis.

L2-L3: Mild disc bulging, ligamentum flavum hypertrophy and bowel
facet arthropathy. There is mild spinal canal stenosis and minimal
bilateral neural foraminal narrowing.

L3-L4: Mild bilateral facet arthropathy. No significant spinal canal
or neural foraminal stenosis.

L4-L5: Asymmetric left disc bulging, ligament flavum hypertrophy
mild facet arthropathy. There is mild spinal canal stenosis, severe
left and moderate right neural foraminal stenosis.

L5-S1: No significant spinal canal or neural foraminal narrowing.
IMPRESSION: Multilevel degenerative changes the lumbar spine, worst at L4-L5
where there is asymmetric left disc bulging and facet arthropathy
resulting in severe left-sided neural foraminal stenosis and exiting
nerve root impingement. Mild spinal canal and moderate right neural
foraminal stenosis at this level.

Milder degenerative changes from L1 through L4 as described above.

## 2022-02-24 ENCOUNTER — Ambulatory Visit: Payer: 59 | Admitting: Cardiovascular Disease

## 2022-02-24 ENCOUNTER — Encounter: Payer: Self-pay | Admitting: Orthopedic Surgery

## 2022-02-24 ENCOUNTER — Ambulatory Visit: Payer: 59 | Admitting: Orthopedic Surgery

## 2022-02-24 VITALS — BP 115/74 | HR 76 | Ht 64.0 in | Wt 170.0 lb

## 2022-02-24 DIAGNOSIS — G5603 Carpal tunnel syndrome, bilateral upper limbs: Secondary | ICD-10-CM

## 2022-02-24 NOTE — Progress Notes (Deleted)
Cardiology Office Note  Date:  02/24/2022   ID:  [name], DOB 12-23-1970, MRN [id number]  PCP:  Kathleen Beam, FNP   No chief complaint on file.   HPI:  Ms. Kathleen Carr is a 51 year old woman with past medical history of Smoking Who presents by referral from [facility] for consultation of her abnormal EKG   CT chest March 2023 No significant aortic atherosclerosis  CT abdomen pelvis March 2023 Aortic atherosclerosis noted  PMH:   has a past medical history of Allergy, Anxiety, Arthritis, Asthma, Bipolar depression ([place]), Cat bite (01/14/2022), Depression, Diabetes mellitus without complication ([place]), GERD (gastroesophageal reflux disease), Hypertension, Pasteurella infection (01/14/2022), PTSD (post-traumatic stress disorder), PTSD (post-traumatic stress disorder), and Thyroid disease.  PSH:    Past Surgical History:  Procedure Laterality Date   ABDOMINAL HYSTERECTOMY     CHOLECYSTECTOMY     COLONOSCOPY WITH PROPOFOL N/A 02/05/2022   Procedure: COLONOSCOPY WITH PROPOFOL;  Surgeon: [name], MD;  Location[facility] ENDOSCOPY;  Service: Gastroenterology;  Laterality: N/A;   ESOPHAGOGASTRODUODENOSCOPY N/A 02/05/2022   Procedure: ESOPHAGOGASTRODUODENOSCOPY (EGD);  Surgeon: [name], MD;  Location: [facility] ENDOSCOPY;  Service: Gastroenterology;  Laterality: N/A;   KNEE ARTHROSCOPY Bilateral    TONSILLECTOMY      Current Outpatient Medications  Medication Sig Dispense Refill   albuterol (PROAIR HFA) 108 (90 Base) MCG/ACT inhaler Inhale 1-2 puffs into the lungs every 6 (six) hours as needed for wheezing. 8.5 g 2   ALPRAZolam (XANAX) 0.25 MG tablet Take 1-2 tablets (0.25-0.5 mg total) by mouth 2 (two) times daily as needed for anxiety (will cause drowsiness.). 30 tablet 2   aspirin EC 81 MG tablet Take 81 mg by mouth daily. Swallow whole.     cholecalciferol (VITAMIN D3) 25 MCG (1000 UNIT) tablet Take 1,000 Units by mouth daily.     cyclobenzaprine (FLEXERIL) 10 MG tablet  Take 1 tablet (10 mg total) by mouth 3 (three) times daily as needed for muscle spasms (will cause drowsiness.). 30 tablet 0   levothyroxine (SYNTHROID) 50 MCG tablet TAKE 1 TABLET BY MOUTH DAILY BEFORE BREAKFAST. 90 tablet 1   loratadine (CLARITIN) 10 MG tablet Take 10 mg by mouth daily.     metFORMIN (GLUCOPHAGE) 500 MG tablet Take 1 tablet (500 mg total) by mouth 2 (two) times daily with a meal. 180 tablet 3   oxybutynin (DITROPAN-XL) 10 MG 24 hr tablet Take 1 tablet (10 mg total) by mouth at bedtime. 90 tablet 1   pantoprazole (PROTONIX) 40 MG tablet Take 1 tablet (40 mg total) by mouth daily. 90 tablet 1   promethazine-dextromethorphan (PROMETHAZINE-DM) 6.25-15 MG/5ML syrup Take 5 mLs by mouth at bedtime as needed for cough. 118 mL 1   QUEtiapine (SEROQUEL) 300 MG tablet TAKE 1 TABLET (300 MG TOTAL) BY MOUTH AT BEDTIME. 90 tablet 1   rosuvastatin (CRESTOR) 5 MG tablet TAKE 1 TABLET BY MOUTH DAILY. 90 tablet 1   sertraline (ZOLOFT) 100 MG tablet TAKE 1 TABLET BY MOUTH DAILY 90 tablet 0   traMADol (ULTRAM) 50 MG tablet Take 1-2 tablets (50-100 mg total) by mouth daily as needed (not to use with any other narcotic or cough syrup/ sedative.). 60 tablet 2   zolpidem (AMBIEN) 5 MG tablet TAKE 1 TABLET (5 MG TOTAL) BY MOUTH AT BEDTIME AS NEEDED FOR SLEEP. 30 tablet 3   No current facility-administered medications for this visit.     Allergies:   Dust mite mixed allergen ext [mite (d. farinae)], Other, and  Sulfa antibiotics   Social History:  The patient  reports that she has been smoking cigarettes. She has a 21.00 pack-year smoking history. She has never used smokeless tobacco. She reports that she does not currently use alcohol. She reports that she does not currently use drugs.   Family History:   family history includes Cancer (age of onset: 48) in her father; Diabetes in her maternal grandfather; Glaucoma in her maternal grandmother; Hypertension in her mother and paternal grandfather; Renal  Disease in her maternal grandfather; Stroke in her paternal grandfather; Thyroid disease in her mother; Ulcerative colitis in her father.    Review of Systems: ROS   PHYSICAL EXAM: VS:  There were no vitals taken for this visit. , BMI There is no height or weight on file to calculate BMI. GEN: Well nourished, well developed, in no acute distress HEENT: normal Neck: no JVD, carotid bruits, or masses Cardiac: RRR; no murmurs, rubs, or gallops,no edema  Respiratory:  clear to auscultation bilaterally, normal work of breathing GI: soft, nontender, nondistended, + BS MS: no deformity or atrophy Skin: warm and dry, no rash Neuro:  Strength and sensation are intact Psych: euthymic mood, full affect    Recent Labs: 01/02/2022: TSH 3.04 01/10/2022: ALT 20; BUN 9; Creatinine, Ser 0.93; Hemoglobin 13.8; Platelets 394.0; Potassium 4.0; Sodium 136    Lipid Panel Lab Results  Component Value Date   CHOL 187 08/16/2021   HDL 56.10 08/16/2021   LDLCALC 106 (H) 08/02/2020   TRIG 214.0 (H) 08/16/2021      Wt Readings from Last 3 Encounters:  02/05/22 170 lb (77.1 kg)  01/29/22 176 lb 9.6 oz (80.1 kg)  01/28/22 178 lb 9.6 oz (81 kg)       ASSESSMENT AND PLAN:  Problem List Items Addressed This Visit   None    Disposition:   F/U  12 months   Total encounter time more than 30 minutes  Greater than 50% was spent in counseling and coordination of care with the patient    Signed, Esmond Plants, M.D., Ph.D. [place], [place]

## 2022-02-24 NOTE — Progress Notes (Signed)
? ?Office Visit Note ?  ?Patient: Kathleen Carr           ?Date of Birth: [birth year]           ?MRN: [id number] ?Visit Date: 02/24/2022 ?             ?Requested by: Doreen Beam, FNP ?No address on file ?PCP: Doreen Beam, FNP ? ? ?Assessment & Plan: ?Visit Diagnoses:  ?1. Carpal tunnel syndrome, bilateral   ? ? ?Plan: We reviewed the nature of carpal tunnel syndrome as well as diagnosis, prognosis, and both conservative and surgical treatment options.  She did have an EMG/nerve conduction study done on 10/31/2021 we suggested moderate to severe bilateral carpal tunnel syndrome.  After our discussion, she would like to proceed with open carpal tunnel release.  We discussed the risk of surgery including bleeding, infection, damage to neurovascular structures incomplete symptom relief pillar type pain from the incision and need for additional surgery.  After discussion she like to proceed with a right carpal tunnel release first followed by a left carpal tunnel release sometime thereafter.  The surgical date and time will be confirmed with the patient. ? ?Follow-Up Instructions: No follow-ups on file.  ? ?Orders:  ?No orders of the defined types were placed in this encounter. ? ?No orders of the defined types were placed in this encounter. ? ? ? ? Procedures: ?No procedures performed ? ? ?Clinical Data: ?No additional findings. ? ? ?Subjective: ?Chief Complaint  ?Patient presents with  ? Right Wrist - Weakness, Edema, Numbness  ? Left Wrist - Weakness, Edema, Numbness  ? ? 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  This has been going on for about a year or so.  The left and right are equally affected.  She describes sharp pain in the palm and into in her fingers.  She says her fingers are numb and tingly throughout the day.  All of her  fingers are involved except for the small finger.  She wakes up 7 nights a week with numbness and paresthesias and pain in her fingers.  She has to hold her hands below the bed for symptom relief.  She has worn a night splints without symptom relief.  Her fingers are numb currently.  Her fingers are intermittently numb during the day regardless of activity.  She had an EMG/nerve conduction study done on 10/31/2021 which suggested moderate to severe bilateral carpal tunnel syndrome. ? ?Weakness ?Associated symptoms include weakness.  ? ?Review of Systems  ?Neurological:  Positive for weakness.  ? ? ?Objective: ?Vital Signs: BP 115/74 (BP Location: Left Arm, Patient Position: Sitting)   Pulse 76   Ht '5\' 4"'$  (1.626 m)   Wt 170 lb (77.1 kg)   BMI 29.18 kg/m?  ? ?Physical Exam ?Constitutional:   ?   Appearance: Normal appearance.  ?Cardiovascular:  ?   Rate and Rhythm: Normal rate.  ?   Pulses: Normal pulses.  ?Pulmonary:  ?   Effort: Pulmonary effort is normal.  ?Skin: ?   General: Skin is warm and dry.  ?   Capillary Refill: Capillary refill takes less than 2 seconds.  ?Neurological:  ?   Mental Status: She is alert.  ? ? ?Right Hand Exam  ? ?Tenderness  ?The patient is experiencing no tenderness.  ? ?Range of Motion  ?The  patient has normal right wrist ROM.  ? ?Other  ?Erythema: absent ?Sensation: normal ?Pulse: present ? ?Comments:  Positive Tinel, Phalen, and Durkan signs.  4+/5 thenar motor strength.  ? ? ?Left Hand Exam  ? ?Tenderness  ?The patient is experiencing no tenderness.  ? ?Range of Motion  ?The patient has normal left wrist ROM. ? ?Other  ?Erythema: absent ?Sensation: normal ?Pulse: present ? ?Comments:  Positive Phalen, and Durkan signs.  4+/5 thenar motor strength.  ? ? ? ? ?Specialty Comments:  ?No specialty comments available. ? ?Imaging: ?No results found. ? ? ?PMFS History: ?Patient Active Problem List  ? Diagnosis Date Noted  ? Cat bite 01/14/2022  ? 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 Pleural effusion 01/09/2022  ? Wheezing on expiration 01/09/2022  ? Lactic acid blood increased 01/09/2022  ? Acute right flank pain 01/09/2022  ? Right lower quadrant abdominal pain 01/09/2022  ? Right upper quadrant abdominal pain 01/09/2022  ? Sore throat 01/01/2022  ? Acute cough 01/01/2022  ? Bacterial upper respiratory infection 01/01/2022  ? Nausea 11/21/2021  ? Situational anxiety 11/21/2021  ? Vitamin D deficiency 11/21/2021  ? Pain of right thumb 11/21/2021  ? Back pain of lumbar region with sciatica 11/21/2021  ? History of lumbar surgery 11/21/2021  ? Hypothyroidism 11/21/2021  ? Open wound of right index finger due to cat bite 11/02/2021  ? Carpal tunnel syndrome, bilateral 11/02/2021  ? Tick bite of right lower leg 09/09/2021  ? Skin lumps- left upper back  09/09/2021  ? Numbness of fingers of both hands 08/20/2021  ? Bipolar disorder, currently in remission ([place]) 07/21/2021  ? Controlled diabetes mellitus type 2 with complications ([place]) 35/32/9924  ? Gastroesophageal reflux disease 03/06/2021  ? Weight loss counseling, encounter for 02/04/2021  ? Leukocytosis 01/21/2021  ? Insomnia 01/21/2021  ? Fatigue 01/21/2021  ? Chronic midline low back pain 08/05/2020  ? Overweight (BMI 25.0-29.9) 08/05/2020  ? Injury of back 08/05/2020  ? ?Past Medical History:  ?Diagnosis Date  ? Allergy   ? Anxiety   ? Arthritis   ? Asthma   ? Bipolar depression ([place])   ? Cat bite 01/14/2022  ? Depression   ? Diabetes mellitus without complication ([place])   ? GERD (gastroesophageal reflux disease)   ? Hypertension   ? 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 PTSD (post-traumatic stress disorder)   ? PTSD (post-traumatic stress disorder)   ? Thyroid disease   ?  ?Family History  ?Problem Relation Age of Onset  ? Hypertension Mother   ? Thyroid disease Mother   ? Cancer Father 49  ?     carcinoid, right lung  ? Ulcerative colitis Father   ? Glaucoma Maternal Grandmother   ? Renal Disease Maternal  Grandfather   ? Diabetes Maternal Grandfather   ?     Type2  ? Stroke Paternal Grandfather   ? Hypertension Paternal Grandfather   ?  ?Past Surgical History:  ?Procedure Laterality Date  ? ABDOMINAL HYSTERECTOMY    ? CHOLECYSTECTOMY    ? COLONOSCOPY WITH PROPOFOL N/A 02/05/2022  ? Procedure: COLONOSCOPY WITH PROPOFOL;  Surgeon: [name], MD;  Location: [facility] ENDOSCOPY;  Service: Gastroenterology;  Laterality: N/A;  ? ESOPHAGOGASTRODUODENOSCOPY N/A 02/05/2022  ? Procedure: ESOPHAGOGASTRODUODENOSCOPY (EGD);  Surgeon: [name], MD;  Location: [facility] ENDOSCOPY;  Service: Gastroenterology;  Laterality: N/A;  ? KNEE ARTHROSCOPY Bilateral   ? TONSILLECTOMY    ? ?Social History  ? ?Occupational History  ?  Not on file  ?Tobacco Use  ? Smoking status: Some Days  ?  Packs/day: 0.50  ?  Years: 42.00  ?  Pack years: 21.00  ?  Types: Cigarettes  ?  Last attempt to quit: 05/25/2020  ?  Years since quitting: 1.7  ? Smokeless tobacco: Never  ? Tobacco comments:  ?  0.5 PPD 01/28/2022   ?  Smoked since she was 51 years old  ?Vaping Use  ? Vaping Use: Some days  ?Substance and Sexual Activity  ? Alcohol use: Not Currently  ? Drug use: Not Currently  ? Sexual activity: Not Currently  ?  Partners: Female  ?  Comment: married to wife  ? ? ? ? ? ? ?

## 2022-02-24 NOTE — H&P (View-Only) (Signed)
? ?Office Visit Note ?  ?Patient: Kathleen Carr           ?Date of Birth: 26-Oct-1971           ?MRN: [id number] ?Visit Date: 02/24/2022 ?             ?Requested by: Doreen Beam, FNP ?No address on file ?PCP: Doreen Beam, FNP ? ? ?Assessment & Plan: ?Visit Diagnoses:  ?1. Carpal tunnel syndrome, bilateral   ? ? ?Plan: We reviewed the nature of carpal tunnel syndrome as well as diagnosis, prognosis, and both conservative and surgical treatment options.  She did have an EMG/nerve conduction study done on 10/31/2021 we suggested moderate to severe bilateral carpal tunnel syndrome.  After our discussion, she would like to proceed with open carpal tunnel release.  We discussed the risk of surgery including bleeding, infection, damage to neurovascular structures incomplete symptom relief pillar type pain from the incision and need for additional surgery.  After discussion she like to proceed with a right carpal tunnel release first followed by a left carpal tunnel release sometime thereafter.  The surgical date and time will be confirmed with the patient. ? ?Follow-Up Instructions: No follow-ups on file.  ? ?Orders:  ?No orders of the defined types were placed in this encounter. ? ?No orders of the defined types were placed in this encounter. ? ? ? ? Procedures: ?No procedures performed ? ? ?Clinical Data: ?No additional findings. ? ? ?Subjective: ?Chief Complaint  ?Patient presents with  ? Right Wrist - Weakness, Edema, Numbness  ? Left Wrist - Weakness, Edema, Numbness  ? ? 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  This has been going on for about a year or so.  The left and right are equally affected.  She describes sharp pain in the palm and into in her fingers.  She says her fingers are numb and tingly throughout the day.  All of her  fingers are involved except for the small finger.  She wakes up 7 nights a week with numbness and paresthesias and pain in her fingers.  She has to hold her hands below the bed for symptom relief.  She has worn a night splints without symptom relief.  Her fingers are numb currently.  Her fingers are intermittently numb during the day regardless of activity.  She had an EMG/nerve conduction study done on 10/31/2021 which suggested moderate to severe bilateral carpal tunnel syndrome. ? ?Weakness ?Associated symptoms include weakness.  ? ?Review of Systems  ?Neurological:  Positive for weakness.  ? ? ?Objective: ?Vital Signs: BP 115/74 (BP Location: Left Arm, Patient Position: Sitting)   Pulse 76   Ht '5\' 4"'$  (1.626 m)   Wt 170 lb (77.1 kg)   BMI 29.18 kg/m?  ? ?Physical Exam ?Constitutional:   ?   Appearance: Normal appearance.  ?Cardiovascular:  ?   Rate and Rhythm: Normal rate.  ?   Pulses: Normal pulses.  ?Pulmonary:  ?   Effort: Pulmonary effort is normal.  ?Skin: ?   General: Skin is warm and dry.  ?   Capillary Refill: Capillary refill takes less than 2 seconds.  ?Neurological:  ?   Mental Status: She is alert.  ? ? ?Right Hand Exam  ? ?Tenderness  ?The patient is experiencing no tenderness.  ? ?Range of Motion  ?The  patient has normal right wrist ROM.  ? ?Other  ?Erythema: absent ?Sensation: normal ?Pulse: present ? ?Comments:  Positive Tinel, Phalen, and Durkan signs.  4+/5 thenar motor strength.  ? ? ?Left Hand Exam  ? ?Tenderness  ?The patient is experiencing no tenderness.  ? ?Range of Motion  ?The patient has normal left wrist ROM. ? ?Other  ?Erythema: absent ?Sensation: normal ?Pulse: present ? ?Comments:  Positive Phalen, and Durkan signs.  4+/5 thenar motor strength.  ? ? ? ? ?Specialty Comments:  ?No specialty comments available. ? ?Imaging: ?No results found. ? ? ?PMFS History: ?Patient Active Problem List  ? Diagnosis Date Noted  ? Cat bite 01/14/2022  ? 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 Pleural effusion 01/09/2022  ? Wheezing on expiration 01/09/2022  ? Lactic acid blood increased 01/09/2022  ? Acute right flank pain 01/09/2022  ? Right lower quadrant abdominal pain 01/09/2022  ? Right upper quadrant abdominal pain 01/09/2022  ? Sore throat 01/01/2022  ? Acute cough 01/01/2022  ? Bacterial upper respiratory infection 01/01/2022  ? Nausea 11/21/2021  ? Situational anxiety 11/21/2021  ? Vitamin D deficiency 11/21/2021  ? Pain of right thumb 11/21/2021  ? Back pain of lumbar region with sciatica 11/21/2021  ? History of lumbar surgery 11/21/2021  ? Hypothyroidism 11/21/2021  ? Open wound of right index finger due to cat bite 11/02/2021  ? Carpal tunnel syndrome, bilateral 11/02/2021  ? Tick bite of right lower leg 09/09/2021  ? Skin lumps- left upper back  09/09/2021  ? Numbness of fingers of both hands 08/20/2021  ? Bipolar disorder, currently in remission ([place]) 07/21/2021  ? Controlled diabetes mellitus type 2 with complications ([place]) 94/70/9628  ? Gastroesophageal reflux disease 03/06/2021  ? Weight loss counseling, encounter for 02/04/2021  ? Leukocytosis 01/21/2021  ? Insomnia 01/21/2021  ? Fatigue 01/21/2021  ? Chronic midline low back pain 08/05/2020  ? Overweight (BMI 25.0-29.9) 08/05/2020  ? Injury of back 08/05/2020  ? ?Past Medical History:  ?Diagnosis Date  ? Allergy   ? Anxiety   ? Arthritis   ? Asthma   ? Bipolar depression ([place])   ? Cat bite 01/14/2022  ? Depression   ? Diabetes mellitus without complication ([place])   ? GERD (gastroesophageal reflux disease)   ? Hypertension   ? 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 PTSD (post-traumatic stress disorder)   ? PTSD (post-traumatic stress disorder)   ? Thyroid disease   ?  ?Family History  ?Problem Relation Age of Onset  ? Hypertension Mother   ? Thyroid disease Mother   ? Cancer Father 1  ?     carcinoid, right lung  ? Ulcerative colitis Father   ? Glaucoma Maternal Grandmother   ? Renal Disease Maternal  Grandfather   ? Diabetes Maternal Grandfather   ?     Type2  ? Stroke Paternal Grandfather   ? Hypertension Paternal Grandfather   ?  ?Past Surgical History:  ?Procedure Laterality Date  ? ABDOMINAL HYSTERECTOMY    ? CHOLECYSTECTOMY    ? COLONOSCOPY WITH PROPOFOL N/A 02/05/2022  ? Procedure: COLONOSCOPY WITH PROPOFOL;  Surgeon: [name], MD;  Location: [facility] ENDOSCOPY;  Service: Gastroenterology;  Laterality: N/A;  ? ESOPHAGOGASTRODUODENOSCOPY N/A 02/05/2022  ? Procedure: ESOPHAGOGASTRODUODENOSCOPY (EGD);  Surgeon: [name], MD;  Location: [facility] ENDOSCOPY;  Service: Gastroenterology;  Laterality: N/A;  ? KNEE ARTHROSCOPY Bilateral   ? TONSILLECTOMY    ? ?Social History  ? ?Occupational History  ?  Not on file  ?Tobacco Use  ? Smoking status: Some Days  ?  Packs/day: 0.50  ?  Years: 42.00  ?  Pack years: 21.00  ?  Types: Cigarettes  ?  Last attempt to quit: 05/25/2020  ?  Years since quitting: 1.7  ? Smokeless tobacco: Never  ? Tobacco comments:  ?  0.5 PPD 01/28/2022   ?  Smoked since she was 51 years old  ?Vaping Use  ? Vaping Use: Some days  ?Substance and Sexual Activity  ? Alcohol use: Not Currently  ? Drug use: Not Currently  ? Sexual activity: Not Currently  ?  Partners: Female  ?  Comment: married to wife  ? ? ? ? ? ? ?

## 2022-02-25 ENCOUNTER — Other Ambulatory Visit: Payer: 59

## 2022-03-03 ENCOUNTER — Encounter (HOSPITAL_BASED_OUTPATIENT_CLINIC_OR_DEPARTMENT_OTHER): Payer: Self-pay | Admitting: Orthopedic Surgery

## 2022-03-03 ENCOUNTER — Other Ambulatory Visit: Payer: Self-pay

## 2022-03-10 ENCOUNTER — Encounter
Admission: RE | Admit: 2022-03-10 | Discharge: 2022-03-10 | Disposition: A | Discharge status: Home / Self Care | Payer: 59 | Source: Ambulatory Visit | Attending: Orthopedic Surgery | Admitting: Orthopedic Surgery

## 2022-03-10 ENCOUNTER — Telehealth: Payer: Self-pay | Admitting: Orthopedic Surgery

## 2022-03-10 DIAGNOSIS — E118 Type 2 diabetes mellitus with unspecified complications: Secondary | ICD-10-CM

## 2022-03-10 DIAGNOSIS — E119 Type 2 diabetes mellitus without complications: Secondary | ICD-10-CM | POA: Diagnosis not present

## 2022-03-10 DIAGNOSIS — Z01812 Encounter for preprocedural laboratory examination: Secondary | ICD-10-CM | POA: Diagnosis not present

## 2022-03-10 LAB — BASIC METABOLIC PANEL
Anion gap: 7 (ref 5–15)
BUN: 12 mg/dL (ref 6–20)
CO2: 27 mmol/L (ref 22–32)
Calcium: 9.1 mg/dL (ref 8.9–10.3)
Chloride: 104 mmol/L (ref 98–111)
Creatinine, Ser: 0.79 mg/dL (ref 0.44–1.00)
GFR, Estimated: >60 mL/min (ref >60)
Glucose, Bld: 90 mg/dL (ref 70–99)
Potassium: 4 mmol/L (ref 3.5–5.1)
Sodium: 138 mmol/L (ref 135–145)

## 2022-03-10 NOTE — Telephone Encounter (Signed)
[place] forms received. To ciox. ?

## 2022-03-10 NOTE — Telephone Encounter (Signed)
Patient scheduled for R CTR on 5/17, please advise if patient will remain oow while waiting to have the left side done. (Forms received) Thank you! ?

## 2022-03-12 ENCOUNTER — Ambulatory Visit (HOSPITAL_BASED_OUTPATIENT_CLINIC_OR_DEPARTMENT_OTHER)
Admission: RE | Admit: 2022-03-12 | Discharge: 2022-03-12 | Disposition: A | Discharge status: Home / Self Care | Payer: 59 | Attending: Orthopedic Surgery | Admitting: Orthopedic Surgery

## 2022-03-12 ENCOUNTER — Encounter (HOSPITAL_BASED_OUTPATIENT_CLINIC_OR_DEPARTMENT_OTHER): Payer: Self-pay | Admitting: Orthopedic Surgery

## 2022-03-12 ENCOUNTER — Other Ambulatory Visit: Payer: Self-pay

## 2022-03-12 ENCOUNTER — Ambulatory Visit (HOSPITAL_BASED_OUTPATIENT_CLINIC_OR_DEPARTMENT_OTHER): Payer: 59 | Admitting: Certified Registered"

## 2022-03-12 ENCOUNTER — Encounter (HOSPITAL_BASED_OUTPATIENT_CLINIC_OR_DEPARTMENT_OTHER)
Admission: RE | Disposition: A | Discharge status: Home / Self Care | Payer: Self-pay | Source: Home / Self Care | Attending: Orthopedic Surgery

## 2022-03-12 DIAGNOSIS — I10 Essential (primary) hypertension: Secondary | ICD-10-CM

## 2022-03-12 DIAGNOSIS — Z7984 Long term (current) use of oral hypoglycemic drugs: Secondary | ICD-10-CM | POA: Insufficient documentation

## 2022-03-12 DIAGNOSIS — G5603 Carpal tunnel syndrome, bilateral upper limbs: Secondary | ICD-10-CM | POA: Insufficient documentation

## 2022-03-12 DIAGNOSIS — F419 Anxiety disorder, unspecified: Secondary | ICD-10-CM | POA: Diagnosis not present

## 2022-03-12 DIAGNOSIS — J45909 Unspecified asthma, uncomplicated: Secondary | ICD-10-CM | POA: Diagnosis not present

## 2022-03-12 DIAGNOSIS — F17210 Nicotine dependence, cigarettes, uncomplicated: Secondary | ICD-10-CM | POA: Diagnosis not present

## 2022-03-12 DIAGNOSIS — E119 Type 2 diabetes mellitus without complications: Secondary | ICD-10-CM | POA: Diagnosis not present

## 2022-03-12 DIAGNOSIS — E118 Type 2 diabetes mellitus with unspecified complications: Secondary | ICD-10-CM

## 2022-03-12 DIAGNOSIS — E039 Hypothyroidism, unspecified: Secondary | ICD-10-CM | POA: Diagnosis not present

## 2022-03-12 DIAGNOSIS — K219 Gastro-esophageal reflux disease without esophagitis: Secondary | ICD-10-CM | POA: Diagnosis not present

## 2022-03-12 DIAGNOSIS — F319 Bipolar disorder, unspecified: Secondary | ICD-10-CM | POA: Insufficient documentation

## 2022-03-12 DIAGNOSIS — G5601 Carpal tunnel syndrome, right upper limb: Secondary | ICD-10-CM | POA: Diagnosis not present

## 2022-03-12 HISTORY — PX: CARPAL TUNNEL RELEASE: SHX101

## 2022-03-12 HISTORY — DX: Hypothyroidism, unspecified: E03.9

## 2022-03-12 LAB — GLUCOSE, CAPILLARY
Glucose-Capillary: 97 mg/dL (ref 70–99)
Glucose-Capillary: 94 mg/dL (ref 70–99)

## 2022-03-12 SURGERY — CARPAL TUNNEL RELEASE
Anesthesia: Regional | Site: Hand | Laterality: Right

## 2022-03-12 MED ORDER — PROPOFOL 10 MG/ML IV BOLUS
INTRAVENOUS | Status: DC | PRN
  Administered 2022-03-12: 50 mg via INTRAVENOUS

## 2022-03-12 MED ORDER — LIDOCAINE 2% (20 MG/ML) 5 ML SYRINGE
INTRAMUSCULAR | Status: DC | PRN
  Administered 2022-03-12: 60 mg via INTRAVENOUS

## 2022-03-12 MED ORDER — MIDAZOLAM HCL 5 MG/5ML IJ SOLN
INTRAMUSCULAR | Status: DC | PRN
  Administered 2022-03-12: 2 mg via INTRAVENOUS

## 2022-03-12 MED ORDER — ACETAMINOPHEN 10 MG/ML IV SOLN
INTRAVENOUS | Status: DC | PRN
  Administered 2022-03-12: 1000 mg via INTRAVENOUS

## 2022-03-12 MED ORDER — ONDANSETRON HCL 4 MG/2ML IJ SOLN
INTRAMUSCULAR | Status: DC | PRN
Start: 2022-03-12 — End: 2022-03-12
  Administered 2022-03-12: 4 mg via INTRAVENOUS

## 2022-03-12 MED ORDER — ACETAMINOPHEN 10 MG/ML IV SOLN
INTRAVENOUS | Status: AC
  Filled 2022-03-12: qty 100

## 2022-03-12 MED ORDER — LIDOCAINE HCL (PF) 1 % IJ SOLN
INTRAMUSCULAR | Status: DC | PRN
  Administered 2022-03-12: 10 mL

## 2022-03-12 MED ORDER — OXYCODONE HCL 5 MG PO TABS
5.0000 mg | ORAL_TABLET | Freq: Four times a day (QID) | ORAL | 0 refills | Status: AC | PRN
  Filled 2022-03-12: qty 12, 3d supply, fill #0

## 2022-03-12 MED ORDER — KETOROLAC TROMETHAMINE 30 MG/ML IJ SOLN
INTRAMUSCULAR | Status: DC | PRN
  Administered 2022-03-12: 30 mg via INTRAVENOUS

## 2022-03-12 MED ORDER — LACTATED RINGERS IV SOLN: INTRAVENOUS | Status: DC

## 2022-03-12 MED ORDER — BUPIVACAINE HCL (PF) 0.25 % IJ SOLN
INTRAMUSCULAR | Status: AC
  Filled 2022-03-12: qty 30

## 2022-03-12 MED ORDER — LIDOCAINE-EPINEPHRINE (PF) 1 %-1:200000 IJ SOLN
INTRAMUSCULAR | Status: AC
  Filled 2022-03-12: qty 30

## 2022-03-12 MED ORDER — 0.9 % SODIUM CHLORIDE (POUR BTL) OPTIME
TOPICAL | Status: DC | PRN
  Administered 2022-03-12: 60 mL

## 2022-03-12 MED ORDER — LIDOCAINE HCL (PF) 1 % IJ SOLN
INTRAMUSCULAR | Status: AC
  Filled 2022-03-12: qty 30

## 2022-03-12 MED ORDER — FENTANYL CITRATE (PF) 100 MCG/2ML IJ SOLN
INTRAMUSCULAR | Status: DC | PRN
  Administered 2022-03-12 (×2): 50 ug via INTRAVENOUS

## 2022-03-12 MED ORDER — PROPOFOL 500 MG/50ML IV EMUL
INTRAVENOUS | Status: DC | PRN
  Administered 2022-03-12: 50 ug/kg/min via INTRAVENOUS

## 2022-03-12 SURGICAL SUPPLY — 42 items
APL PRP STRL LF DISP 70% ISPRP (MISCELLANEOUS) ×1
BLADE SURG 15 STRL LF DISP TIS (BLADE) ×1 IMPLANT
BLADE SURG 15 STRL SS (BLADE) ×2
BNDG CMPR 9X4 STRL LF SNTH (GAUZE/BANDAGES/DRESSINGS) ×1
BNDG ELASTIC 3X5.8 VLCR STR LF (GAUZE/BANDAGES/DRESSINGS) ×2 IMPLANT
BNDG ESMARK 4X9 LF (GAUZE/BANDAGES/DRESSINGS) ×2 IMPLANT
BNDG GAUZE ELAST 4 BULKY (GAUZE/BANDAGES/DRESSINGS) ×2 IMPLANT
BNDG PLASTER X FAST 3X3 WHT LF (CAST SUPPLIES) IMPLANT
BNDG PLSTR 9X3 FST ST WHT (CAST SUPPLIES)
CHLORAPREP W/TINT 26 (MISCELLANEOUS) ×2 IMPLANT
CORD BIPOLAR FORCEPS 12FT (ELECTRODE) ×2 IMPLANT
COVER BACK TABLE 60X90IN (DRAPES) ×2 IMPLANT
COVER MAYO STAND STRL (DRAPES) ×2 IMPLANT
CUFF TOURN SGL QUICK 18X4 (TOURNIQUET CUFF) IMPLANT
CUFF TOURN SGL QUICK 24 (TOURNIQUET CUFF)
CUFF TRNQT CYL 24X4X16.5-23 (TOURNIQUET CUFF) IMPLANT
DRAPE EXTREMITY T 121X128X90 (DISPOSABLE) ×2 IMPLANT
DRAPE SURG 17X23 STRL (DRAPES) ×2 IMPLANT
GAUZE XEROFORM 1X8 LF (GAUZE/BANDAGES/DRESSINGS) ×1 IMPLANT
GLOVE BIO SURGEON STRL SZ7 (GLOVE) ×2 IMPLANT
GLOVE BIOGEL PI IND STRL 7.0 (GLOVE) ×1 IMPLANT
GLOVE BIOGEL PI INDICATOR 7.0 (GLOVE) ×1
GOWN STRL REUS W/ TWL LRG LVL3 (GOWN DISPOSABLE) ×1 IMPLANT
GOWN STRL REUS W/TWL LRG LVL3 (GOWN DISPOSABLE) ×2
GOWN STRL REUS W/TWL XL LVL3 (GOWN DISPOSABLE) ×2 IMPLANT
NDL HYPO 25X1 1.5 SAFETY (NEEDLE) IMPLANT
NEEDLE HYPO 25X1 1.5 SAFETY (NEEDLE) IMPLANT
NS IRRIG 1000ML POUR BTL (IV SOLUTION) ×2 IMPLANT
PACK BASIN DAY SURGERY FS (CUSTOM PROCEDURE TRAY) ×2 IMPLANT
PAD CAST 3X4 CTTN HI CHSV (CAST SUPPLIES) ×1 IMPLANT
PADDING CAST COTTON 3X4 STRL (CAST SUPPLIES) ×2
SLEEVE SCD COMPRESS KNEE MED (STOCKING) IMPLANT
SUCTION FRAZIER HANDLE 10FR (MISCELLANEOUS)
SUCTION TUBE FRAZIER 10FR DISP (MISCELLANEOUS) IMPLANT
SUT ETHILON 4 0 PS 2 18 (SUTURE) ×2 IMPLANT
SUT MNCRL AB 3-0 PS2 18 (SUTURE) IMPLANT
SUT VICRYL 4-0 PS2 18IN ABS (SUTURE) IMPLANT
SYR BULB EAR ULCER 3OZ GRN STR (SYRINGE) ×2 IMPLANT
SYR CONTROL 10ML LL (SYRINGE) IMPLANT
TOWEL GREEN STERILE FF (TOWEL DISPOSABLE) ×4 IMPLANT
TUBE CONNECTING 20X1/4 (TUBING) IMPLANT
UNDERPAD 30X36 HEAVY ABSORB (UNDERPADS AND DIAPERS) ×2 IMPLANT

## 2022-03-12 NOTE — Interval H&P Note (Signed)
History and Physical Interval Note: ? ?03/12/2022 ?8:32 AM ? ?Kathleen Carr  has presented today for surgery, with the diagnosis of RIGHT CARPAL TUNNEL SYNDROME.  The various methods of treatment have been discussed with the patient and family. After consideration of risks, benefits and other options for treatment, the patient has consented to  Procedure(s): ?RIGHT CARPAL TUNNEL RELEASE (Right) as a surgical intervention.  The patient's history has been reviewed, patient examined, no change in status, stable for surgery.  I have reviewed the patient's chart and labs.  Questions were answered to the patient's satisfaction.   ? ? ? Ashawnti Tangen ? ? ?

## 2022-03-12 NOTE — Anesthesia Procedure Notes (Addendum)
Procedure Name: [place] ?Date/Time: 03/12/2022 10:14 AM ?Performed by: Ezequiel Kayser, CRNA ?Pre-anesthesia Checklist: Patient identified, Emergency Drugs available, Suction available, Patient being monitored and Timeout performed ?Patient Re-evaluated:Patient Re-evaluated prior to induction ?Oxygen Delivery Method: Simple face mask ? ? ? ? ?

## 2022-03-12 NOTE — Discharge Instructions (Addendum)
 Charles Benfield, M.D. Hand Surgery  POST-OPERATIVE DISCHARGE INSTRUCTIONS   PRESCRIPTIONS: - You have been given a prescription to be taken as directed for post-operative pain control.  You may also take over the counter ibuprofen/aleve and tylenol for pain. Take this as directed on the packaging. Do not exceed 3000 mg tylenol/acetaminophen in 24 hours.  Ibuprofen 600-800 mg (3-4) tablets by mouth every 6 hours as needed for pain.   OR  Aleve 2 tablets by mouth every 12 hours (twice daily) as needed for pain.   AND/OR  Tylenol 1000 mg (2 tablets) every 8 hours as needed for pain.  - Please use your pain medication carefully, as refills are limited and you may not be provided with one.  As stated above, please use over the counter pain medicine - it will also be helpful with decreasing your swelling.    ANESTHESIA: -After your surgery, post-surgical discomfort or pain is likely. This discomfort can last several days to a few weeks. At certain times of the day your discomfort may be more intense.   Did you receive a nerve block?   - A nerve block can provide pain relief for one hour to two days after your surgery. As long as the nerve block is working, you will experience little or no sensation in the area the surgeon operated on.  - As the nerve block wears off, you will begin to experience pain or discomfort. It is very important that you begin taking your prescribed pain medication before the nerve block fully wears off. Treating your pain at the first sign of the block wearing off will ensure your pain is better controlled and more tolerable when full-sensation returns. Do not wait until the pain is intolerable, as the medicine will be less effective. It is better to treat pain in advance than to try and catch up.   General Anesthesia:  If you did not receive a nerve block during your surgery, you will need to start taking your pain medication shortly after your surgery and  should continue to do so as prescribed by your surgeon.     ICE AND ELEVATION: - You may use ice for the first 48-72 hours, but it is not critical.   - Motion of your fingers is very important to decrease the swelling.  - Elevation, as much as possible for the next 48 hours, is critical for decreasing swelling as well as for pain relief. Elevation means when you are seated or lying down, you hand should be at or above your heart. When walking, the hand needs to be at or above the level of your elbow.  - If the bandage gets too tight, it may need to be loosened. Please contact our office and we will instruct you in how to do this.    SURGICAL BANDAGES:  - Keep your dressing and/or splint clean and dry at all times.  You can remove your dressing 4 days from now and change with a dry dressing or Band-Aids as needed thereafter. - You may place a plastic bag over your bandage to shower, but be careful, do not get your bandages wet.  - After the bandages have been removed, it is OK to get the stitches wet in a shower or with hand washing. Do Not soak or submerge the wound yet. Please do not use lotions or creams on the stitches.      HAND THERAPY:  - You may not need any. If you do,   we will begin this at your follow up visit in the clinic.  ? ? ?ACTIVITY AND WORK: ?- You are encouraged to move any fingers which are not in the bandage.  ?- Light use of the fingers is allowed to assist the other hand with daily hygiene and eating, but strong gripping or lifting is often uncomfortable and should be avoided.  ?- You might miss a variable period of time from work and hopefully this issue has been discussed prior to surgery. You may not do any heavy work with your affected hand for about 2 weeks.  ? ? ?[facility] ?[address] ?[place],  [place]  [address] ?[phone]  ? ? ?May take Tylenol after 4:50pm, if needed ?May take NSAIDS (ibuprofen, motrin) after 4:50pm, if needed.  ? ? ?Post  Anesthesia Home Care Instructions ? ?Activity: ?Get plenty of rest for the remainder of the day. A responsible individual must stay with you for 24 hours following the procedure.  ?For the next 24 hours, DO NOT: ?-Drive a car ?-Paediatric nurse ?-Drink alcoholic beverages ?-Take any medication unless instructed by your physician ?-Make any legal decisions or sign important papers. ? ?Meals: ?Start with liquid foods such as gelatin or soup. Progress to regular foods as tolerated. Avoid greasy, spicy, heavy foods. If nausea and/or vomiting occur, drink only clear liquids until the nausea and/or vomiting subsides. Call your physician if vomiting continues. ? ?Special Instructions/Symptoms: ?Your throat may feel dry or sore from the anesthesia or the breathing tube placed in your throat during surgery. If this causes discomfort, gargle with warm salt water. The discomfort should disappear within 24 hours. ? ?If you had a scopolamine patch placed behind your ear for the management of post- operative nausea and/or vomiting: ? ?1. The medication in the patch is effective for 72 hours, after which it should be removed.  Wrap patch in a tissue and discard in the trash. Wash hands thoroughly with soap and water. ?2. You may remove the patch earlier than 72 hours if you experience unpleasant side effects which may include dry mouth, dizziness or visual disturbances. ?3. Avoid touching the patch. Wash your hands with soap and water after contact with the patch. ?    ?

## 2022-03-12 NOTE — Transfer of Care (Signed)
Immediate Anesthesia Transfer of Care Note ? ?Patient: Kathleen Carr ? ?Procedure(s) Performed: RIGHT CARPAL TUNNEL RELEASE (Right: Hand) ? ?Patient Location: PACU ? ?Anesthesia Type:MAC ? ?Level of Consciousness: drowsy ? ?Airway & Oxygen Therapy: Patient Spontanous Breathing and Patient connected to face mask oxygen ? ?Post-op Assessment: Report given to RN and Post -op Vital signs reviewed and stable ? ?Post vital signs: Reviewed and stable ? ?Last Vitals:  ?Vitals Value Taken Time  ?BP 132/77 03/12/22 1046  ?Temp 36.4 ?C 03/12/22 1046  ?Pulse 69 03/12/22 1048  ?Resp 16 03/12/22 1048  ?SpO2 98 % 03/12/22 1048  ?Vitals shown include unvalidated device data. ? ?Last Pain:  ?Vitals:  ? 03/12/22 0840  ?TempSrc: Oral  ?PainSc: 4   ?   ? ?Patients Stated Pain Goal: 2 (03/12/22 0840) ? ?Complications: No notable events documented. ?

## 2022-03-12 NOTE — Op Note (Signed)
? ?  Date of Surgery: 03/12/2022 ? ?INDICATIONS: Patient is a 51 y.o.-year-old female with right carpal tunnel syndrome that was confirmed on electrodiagnostic studies and has failed conservative management.  Risks, benefits, and alternatives to surgery were again discussed with the patient in the preoperative area. The patient wishes to proceed with surgery.  Informed consent was signed after our discussion.  ? ?PREOPERATIVE DIAGNOSIS:  ?Right carpal tunnel syndrome ? ?POSTOPERATIVE DIAGNOSIS: Same. ? ?PROCEDURE:  ?Right carpal tunnel release ? ? ?SURGEON: Audria Nine, M.D. ? ?ASSIST:  ? ?ANESTHESIA:  Local, MAC ? ?IV FLUIDS AND URINE: See anesthesia. ? ?ESTIMATED BLOOD LOSS: <5 mL. ? ?IMPLANTS: * No implants in log *  ? ?DRAINS: None ? ?COMPLICATIONS: None ? ?DESCRIPTION OF PROCEDURE:  ?The patient was met in the preoperative holding area where the surgical site was marked and the consent form was verified.  The patient was then taken to the operating room and transferred to the operating table.  All bony prominences were well padded.  A tourniquet was applied to the right forearm.  Monitored sedation was induced.   A formal time-out was performed to confirm that this was the correct patient, surgery, side, and site. Following timeout, a local block was performed using 10cc of 1% plain lidocaine. The operative extremity was then prepped and draped in the usual and sterile fashion.   ? ?Following a second timeout, the limb was exsanguinated and the tourniquet inflated to 250 mmHg.  A longitudinal incision was made in line with the radial border of the ring finger from distal to the wrist flexion crease to the intersection of Kaplan's cardinal line.  The skin and subcutaneous tissue was sharply divided.  The longitudinally running palmar fascia was incised.  The thenar musculature was bluntly swept off of the transverse carpal ligament.  The ligament was divided from proximal to distal until the fat surrounding  the palmar arch was encountered. Retractors were then placed in the proximal aspect of the wound to visualize the distal antebrachial fascia.  The fascia was sharply divided under direct visualization.   The wound was then thoroughly irrigated with sterile saline.  The tourniquet was deflated.  Hemostasis was achieved with direct pressure and bipolar electrocautery.  The wound was then closed with 4-0 nylon sutures in a horizontal mattress fashion. The wound was then dressed with xeroform, folded kerlix, and an ace wrap. ? ?The patient was then reversed from anesthesia and transferred to the postoperative bed.  All counts were correct x 2 at the end of the procedure.  The patient was taken to the recovery unit in stable condition.    ? ?POSTOPERATIVE PLAN: Patient will be discharged to home with appropriate pain medication and discharge instructions.  I will see her back in the office in 10-14 days for her first postop visit.  ? ?Audria Nine, MD ?10:44 AM  ?

## 2022-03-12 NOTE — Anesthesia Postprocedure Evaluation (Signed)
Anesthesia Post Note ? ?Patient: Kathleen Carr ? ?Procedure(s) Performed: RIGHT CARPAL TUNNEL RELEASE (Right: Hand) ? ?  ? ?Patient location during evaluation: Phase II ?Anesthesia Type: MAC ?Level of consciousness: awake and sedated ?Pain management: pain level controlled ?Vital Signs Assessment: post-procedure vital signs reviewed and stable ?Respiratory status: spontaneous breathing ?Cardiovascular status: stable ?Postop Assessment: no apparent nausea or vomiting ?Anesthetic complications: no ? ? ?No notable events documented. ? ?Last Vitals:  ?Vitals:  ? 03/12/22 1050 03/12/22 1100  ?BP:  120/71  ?Pulse:  70  ?Resp:  17  ?Temp:    ?SpO2: 98% 96%  ?  ?Last Pain:  ?Vitals:  ? 03/12/22 1100  ?TempSrc:   ?PainSc: Asleep  ? ? ?  ?  ?  ?  ?  ?  ? ?Huston Foley ? ? ? ? ?

## 2022-03-12 NOTE — Anesthesia Preprocedure Evaluation (Addendum)
Anesthesia Evaluation  ?Patient identified by MRN, date of birth, ID band ?Patient awake ? ? ? ?Reviewed: ?Allergy & Precautions, H&P , NPO status , Patient's Chart, lab work & pertinent test results, reviewed documented beta blocker date and time  ? ?Airway ?Mallampati: II ? ?TM Distance: >3 FB ?Neck ROM: full ? ? ? Dental ? ?(+) Dental Advidsory Given, Caps, Partial Lower, Missing, Teeth Intact ?  ?Pulmonary ?neg shortness of breath, asthma , neg sleep apnea, neg COPD, Recent URI , Resolved, Current Smoker and Patient abstained from smoking.,  ?  ?Pulmonary exam normal ? ? ? ? ? ? ? Cardiovascular ?Exercise Tolerance: Good ?hypertension, (-) angina(-) Past MI and (-) Cardiac Stents Normal cardiovascular exam(-) dysrhythmias (-) Valvular Problems/Murmurs ? ? ?  ?Neuro/Psych ?PSYCHIATRIC DISORDERS Anxiety Depression Bipolar Disorder  Neuromuscular disease negative neurological ROS ?   ? GI/Hepatic ?Neg liver ROS, GERD  Medicated and Controlled,  ?Endo/Other  ?diabetes, Type 2, Oral Hypoglycemic AgentsHypothyroidism  ? Renal/GU ?negative Renal ROS  ?negative genitourinary ?  ?Musculoskeletal ? ? Abdominal ?Normal abdominal exam  (+)   ?Peds ? Hematology ?negative hematology ROS ?(+)   ?Anesthesia Other Findings ? ? ? Reproductive/Obstetrics ?negative OB ROS ? ?  ? ? ? ? ? ? ? ? ? ? ? ? ? ?  ?  ? ? ? ? ? ? ? ? ?Anesthesia Physical ? ?Anesthesia Plan ? ?ASA: 2 ? ?Anesthesia Plan: MAC  ? ?Post-op Pain Management: Minimal or no pain anticipated  ? ?Induction: Intravenous ? ?PONV Risk Score and Plan: 1 and TIVA and Propofol infusion ? ?Airway Management Planned: Natural Airway, Nasal Cannula and Simple Face Mask ? ?Additional Equipment: None ? ?Intra-op Plan:  ? ?Post-operative Plan:  ? ?Informed Consent: I have reviewed the patients History and Physical, chart, labs and discussed the procedure including the risks, benefits and alternatives for the proposed anesthesia with the patient or  authorized representative who has indicated his/her understanding and acceptance.  ? ? ? ? ? ?Plan Discussed with: CRNA ? ?Anesthesia Plan Comments:   ? ? ? ? ? ?Anesthesia Quick Evaluation ? ?

## 2022-03-13 ENCOUNTER — Other Ambulatory Visit: Payer: Self-pay | Admitting: Internal Medicine

## 2022-03-13 ENCOUNTER — Other Ambulatory Visit: Payer: Self-pay

## 2022-03-13 DIAGNOSIS — R062 Wheezing: Secondary | ICD-10-CM

## 2022-03-13 DIAGNOSIS — K219 Gastro-esophageal reflux disease without esophagitis: Secondary | ICD-10-CM

## 2022-03-13 MED FILL — Alprazolam Tab 0.25 MG: ORAL | 8 days supply | Qty: 30 | Fill #1 | Status: AC

## 2022-03-13 MED FILL — Tramadol HCl Tab 50 MG: ORAL | 30 days supply | Qty: 60 | Fill #1 | Status: AC

## 2022-03-14 ENCOUNTER — Other Ambulatory Visit: Payer: Self-pay

## 2022-03-14 MED ORDER — CYCLOBENZAPRINE HCL 10 MG PO TABS
10.0000 mg | ORAL_TABLET | Freq: Three times a day (TID) | ORAL | 0 refills | Status: DC | PRN
  Filled 2022-03-14: qty 30, 10d supply, fill #0

## 2022-03-14 MED ORDER — PANTOPRAZOLE SODIUM 40 MG PO TBEC
40.0000 mg | DELAYED_RELEASE_TABLET | Freq: Every day | ORAL | 1 refills | Status: DC
  Filled 2022-03-14: qty 90, 90d supply, fill #0
  Filled 2022-06-01: qty 90, 90d supply, fill #1

## 2022-03-17 ENCOUNTER — Other Ambulatory Visit: Payer: Self-pay

## 2022-03-17 ENCOUNTER — Other Ambulatory Visit: Payer: Self-pay | Admitting: Internal Medicine

## 2022-03-17 DIAGNOSIS — G4700 Insomnia, unspecified: Secondary | ICD-10-CM

## 2022-03-19 MED FILL — Zolpidem Tartrate Tab 5 MG: ORAL | 30 days supply | Qty: 30 | Fill #0 | Status: AC

## 2022-03-19 NOTE — Telephone Encounter (Signed)
Previous Kathleen Carr pt. Pt has established with Eugenia Pancoast but does not see her until 04/02/2022.  Refilled: 11/19/2021 Last OV: 01/09/2022 Next OV: 04/02/2022 with Eugenia Pancoast

## 2022-03-20 ENCOUNTER — Ambulatory Visit: Payer: 59

## 2022-03-20 ENCOUNTER — Other Ambulatory Visit: Payer: Self-pay

## 2022-03-25 ENCOUNTER — Ambulatory Visit (INDEPENDENT_AMBULATORY_CARE_PROVIDER_SITE_OTHER): Payer: 59 | Admitting: Orthopedic Surgery

## 2022-03-25 DIAGNOSIS — G5601 Carpal tunnel syndrome, right upper limb: Secondary | ICD-10-CM

## 2022-03-25 NOTE — Progress Notes (Signed)
Post-Op Visit Note   Patient: Kathleen Carr           Date of Birth: 03-20-1971           MRN: [id number] Visit Date: 03/25/2022 PCP: Doreen Beam, FNP   Assessment & Plan:  Chief Complaint:  Chief Complaint  Patient presents with   Right Hand - Routine Post Op    DOS: 03/12/22   Visit Diagnoses:  1. Carpal tunnel syndrome, right upper limb     Plan: Patient is just under two weeks s/p right carpal tunnel release.  She is doing very well.  Her sensation has returned.  She has not had any further nocutural symptoms.  She has 5/5 thenar motor strength.  Her incision is well healed without surrounding erythema or induration.  She can follow up again when she is ready to schedule the left side.   Follow-Up Instructions: No follow-ups on file.   Orders:  No orders of the defined types were placed in this encounter.  No orders of the defined types were placed in this encounter.   Imaging: No results found.  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   Pleural effusion 01/09/2022   Wheezing on expiration 01/09/2022   Lactic acid blood increased 01/09/2022   Acute right flank pain 01/09/2022   Right lower quadrant abdominal pain 01/09/2022   Right upper quadrant abdominal pain 01/09/2022   Sore throat 01/01/2022   Acute cough 01/01/2022   Bacterial upper respiratory infection 01/01/2022   Nausea 11/21/2021   Situational anxiety 11/21/2021   Vitamin D deficiency 11/21/2021   Pain of right thumb 11/21/2021   Back pain of lumbar region with sciatica 11/21/2021   History of lumbar surgery 11/21/2021   Hypothyroidism 11/21/2021   Open wound of right index finger due to cat bite 11/02/2021   Carpal tunnel syndrome, right upper limb 11/02/2021   Tick bite of right lower leg 09/09/2021   Skin lumps- left upper back  09/09/2021   Numbness of  fingers of both hands 08/20/2021   Bipolar disorder, currently in remission ([place]) 07/21/2021   Controlled diabetes mellitus type 2 with complications ([place]) 28/36/6294   Gastroesophageal reflux disease 03/06/2021   Weight loss counseling, encounter for 02/04/2021   Leukocytosis 01/21/2021   Insomnia 01/21/2021   Fatigue 01/21/2021   Chronic midline low back pain 08/05/2020   Overweight (BMI 25.0-29.9) 08/05/2020   Injury of back 08/05/2020   Past Medical History:  Diagnosis Date   Allergy    Anxiety    Arthritis    Asthma    Bipolar depression ([place])    Cat bite 01/14/2022   Depression    Diabetes mellitus without complication (HCC)    GERD (gastroesophageal reflux disease)    Hypertension    Hypothyroidism    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   PTSD (post-traumatic stress disorder)    PTSD (post-traumatic stress disorder)    Thyroid disease     Family History  Problem Relation Age of Onset   Hypertension Mother    Thyroid disease Mother    Cancer Father 55       carcinoid, right lung   Ulcerative colitis Father    Glaucoma Maternal Grandmother    Renal Disease Maternal Grandfather    Diabetes Maternal Grandfather        Type2   Stroke Paternal Grandfather  Hypertension Paternal Grandfather     Past Surgical History:  Procedure Laterality Date   ABDOMINAL HYSTERECTOMY     CARPAL TUNNEL RELEASE Right 03/12/2022   Procedure: RIGHT CARPAL TUNNEL RELEASE;  Surgeon: Sherilyn Cooter, MD;  Location: [facility];  Service: Orthopedics;  Laterality: Right;   CHOLECYSTECTOMY     COLONOSCOPY WITH PROPOFOL N/A 02/05/2022   Procedure: COLONOSCOPY WITH PROPOFOL;  Surgeon: [name], MD;  [facility];  Service: Gastroenterology;  Laterality: N/A;   ESOPHAGOGASTRODUODENOSCOPY N/A 02/05/2022   Procedure: ESOPHAGOGASTRODUODENOSCOPY (EGD);  Surgeon: [name], MD;  Location: [facility] ENDOSCOPY;  Service: Gastroenterology;  Laterality: N/A;   KNEE  ARTHROSCOPY Bilateral    TONSILLECTOMY     Social History   Occupational History   Not on file  Tobacco Use   Smoking status: Some Days    Packs/day: 0.50    Years: 42.00    Pack years: 21.00    Types: Cigarettes    Last attempt to quit: 05/25/2020    Years since quitting: 1.8   Smokeless tobacco: Never   Tobacco comments:    0.5 PPD 01/28/2022     Smoked since she was 51 years old  Vaping Use   Vaping Use: Some days  Substance and Sexual Activity   Alcohol use: Not Currently   Drug use: Not Currently   Sexual activity: Not Currently    Partners: Female    Comment: married to wife

## 2022-03-25 NOTE — H&P (View-Only) (Signed)
Post-Op Visit Note   Patient: Kathleen Carr           Date of Birth: 1971/01/03           MRN: [id number] Visit Date: 03/25/2022 PCP: Doreen Beam, FNP   Assessment & Plan:  Chief Complaint:  Chief Complaint  Patient presents with   Right Hand - Routine Post Op    DOS: 03/12/22   Visit Diagnoses:  1. Carpal tunnel syndrome, right upper limb     Plan: Patient is just under two weeks s/p right carpal tunnel release.  She is doing very well.  Her sensation has returned.  She has not had any further nocutural symptoms.  She has 5/5 thenar motor strength.  Her incision is well healed without surrounding erythema or induration.  She can follow up again when she is ready to schedule the left side.   Follow-Up Instructions: No follow-ups on file.   Orders:  No orders of the defined types were placed in this encounter.  No orders of the defined types were placed in this encounter.   Imaging: No results found.  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   Pleural effusion 01/09/2022   Wheezing on expiration 01/09/2022   Lactic acid blood increased 01/09/2022   Acute right flank pain 01/09/2022   Right lower quadrant abdominal pain 01/09/2022   Right upper quadrant abdominal pain 01/09/2022   Sore throat 01/01/2022   Acute cough 01/01/2022   Bacterial upper respiratory infection 01/01/2022   Nausea 11/21/2021   Situational anxiety 11/21/2021   Vitamin D deficiency 11/21/2021   Pain of right thumb 11/21/2021   Back pain of lumbar region with sciatica 11/21/2021   History of lumbar surgery 11/21/2021   Hypothyroidism 11/21/2021   Open wound of right index finger due to cat bite 11/02/2021   Carpal tunnel syndrome, right upper limb 11/02/2021   Tick bite of right lower leg 09/09/2021   Skin lumps- left upper back  09/09/2021   Numbness of  fingers of both hands 08/20/2021   Bipolar disorder, currently in remission ([place]) 07/21/2021   Controlled diabetes mellitus type 2 with complications ([place]) 57/84/6962   Gastroesophageal reflux disease 03/06/2021   Weight loss counseling, encounter for 02/04/2021   Leukocytosis 01/21/2021   Insomnia 01/21/2021   Fatigue 01/21/2021   Chronic midline low back pain 08/05/2020   Overweight (BMI 25.0-29.9) 08/05/2020   Injury of back 08/05/2020   Past Medical History:  Diagnosis Date   Allergy    Anxiety    Arthritis    Asthma    Bipolar depression ([place])    Cat bite 01/14/2022   Depression    Diabetes mellitus without complication (HCC)    GERD (gastroesophageal reflux disease)    Hypertension    Hypothyroidism    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   PTSD (post-traumatic stress disorder)    PTSD (post-traumatic stress disorder)    Thyroid disease     Family History  Problem Relation Age of Onset   Hypertension Mother    Thyroid disease Mother    Cancer Father 33       carcinoid, right lung   Ulcerative colitis Father    Glaucoma Maternal Grandmother    Renal Disease Maternal Grandfather    Diabetes Maternal Grandfather        Type2   Stroke Paternal Grandfather  Hypertension Paternal Grandfather     Past Surgical History:  Procedure Laterality Date   ABDOMINAL HYSTERECTOMY     CARPAL TUNNEL RELEASE Right 03/12/2022   Procedure: RIGHT CARPAL TUNNEL RELEASE;  Surgeon: Sherilyn Cooter, MD;  Location: [facility];  Service: Orthopedics;  Laterality: Right;   CHOLECYSTECTOMY     COLONOSCOPY WITH PROPOFOL N/A 02/05/2022   Procedure: COLONOSCOPY WITH PROPOFOL;  Surgeon: [name], MD;  [facility]COPY;  Service: Gastroenterology;  Laterality: N/A;   ESOPHAGOGASTRODUODENOSCOPY N/A 02/05/2022   Procedure: ESOPHAGOGASTRODUODENOSCOPY (EGD);  Surgeon: [name], MD;  Location: [facility] ENDOSCOPY;  Service: Gastroenterology;  Laterality: N/A;   KNEE  ARTHROSCOPY Bilateral    TONSILLECTOMY     Social History   Occupational History   Not on file  Tobacco Use   Smoking status: Some Days    Packs/day: 0.50    Years: 42.00    Pack years: 21.00    Types: Cigarettes    Last attempt to quit: 05/25/2020    Years since quitting: 1.8   Smokeless tobacco: Never   Tobacco comments:    0.5 PPD 01/28/2022     Smoked since she was 51 years old  Vaping Use   Vaping Use: Some days  Substance and Sexual Activity   Alcohol use: Not Currently   Drug use: Not Currently   Sexual activity: Not Currently    Partners: Female    Comment: married to wife

## 2022-03-27 ENCOUNTER — Other Ambulatory Visit: Payer: Medicaid Other

## 2022-03-30 ENCOUNTER — Encounter: Payer: Self-pay | Admitting: Gastroenterology

## 2022-03-30 NOTE — Progress Notes (Signed)
Inform   1. Polyp in esophagus was benign -= repeat EGD in 1 year 2. 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

## 2022-03-31 ENCOUNTER — Telehealth: Payer: Medicaid Other | Admitting: Oncology

## 2022-04-01 ENCOUNTER — Ambulatory Visit: Payer: 59 | Admitting: Pulmonary Disease

## 2022-04-02 ENCOUNTER — Other Ambulatory Visit: Payer: Self-pay

## 2022-04-02 ENCOUNTER — Encounter: Payer: Self-pay | Admitting: Family

## 2022-04-02 ENCOUNTER — Encounter: Payer: 59 | Admitting: Family

## 2022-04-02 ENCOUNTER — Ambulatory Visit (INDEPENDENT_AMBULATORY_CARE_PROVIDER_SITE_OTHER): Payer: 59 | Admitting: Family

## 2022-04-02 VITALS — BP 106/60 | HR 80 | Temp 98.2°F | Resp 16 | Ht 64.0 in | Wt 179.2 lb

## 2022-04-02 DIAGNOSIS — J029 Acute pharyngitis, unspecified: Secondary | ICD-10-CM

## 2022-04-02 DIAGNOSIS — E039 Hypothyroidism, unspecified: Secondary | ICD-10-CM

## 2022-04-02 DIAGNOSIS — M545 Low back pain, unspecified: Secondary | ICD-10-CM

## 2022-04-02 DIAGNOSIS — F17200 Nicotine dependence, unspecified, uncomplicated: Secondary | ICD-10-CM

## 2022-04-02 DIAGNOSIS — F3170 Bipolar disorder, currently in remission, most recent episode unspecified: Secondary | ICD-10-CM | POA: Diagnosis not present

## 2022-04-02 DIAGNOSIS — K219 Gastro-esophageal reflux disease without esophagitis: Secondary | ICD-10-CM

## 2022-04-02 DIAGNOSIS — E118 Type 2 diabetes mellitus with unspecified complications: Secondary | ICD-10-CM | POA: Diagnosis not present

## 2022-04-02 DIAGNOSIS — F418 Other specified anxiety disorders: Secondary | ICD-10-CM

## 2022-04-02 DIAGNOSIS — E559 Vitamin D deficiency, unspecified: Secondary | ICD-10-CM

## 2022-04-02 DIAGNOSIS — G4700 Insomnia, unspecified: Secondary | ICD-10-CM

## 2022-04-02 DIAGNOSIS — Z79899 Other long term (current) drug therapy: Secondary | ICD-10-CM

## 2022-04-02 DIAGNOSIS — Z20822 Contact with and (suspected) exposure to covid-19: Secondary | ICD-10-CM

## 2022-04-02 DIAGNOSIS — G8929 Other chronic pain: Secondary | ICD-10-CM

## 2022-04-02 DIAGNOSIS — Z720 Tobacco use: Secondary | ICD-10-CM

## 2022-04-02 DIAGNOSIS — E663 Overweight: Secondary | ICD-10-CM

## 2022-04-02 LAB — POCT GLYCOSYLATED HEMOGLOBIN (HGB A1C): Hemoglobin A1C: 6.7 % — AB (ref 4.0–5.6)

## 2022-04-02 MED ORDER — METHYLPREDNISOLONE 4 MG PO TBPK
ORAL_TABLET | ORAL | 0 refills | Status: DC
  Filled 2022-04-02: qty 21, 6d supply, fill #0

## 2022-04-02 MED ORDER — AMOXICILLIN-POT CLAVULANATE 875-125 MG PO TABS
1.0000 | ORAL_TABLET | Freq: Two times a day (BID) | ORAL | 0 refills | Status: DC
  Filled 2022-04-02: qty 20, 10d supply, fill #0

## 2022-04-02 NOTE — Assessment & Plan Note (Signed)
Cont protonix 40 mg once daily Try to decrease and or avoid spicy foods, fried fatty foods, and also caffeine and chocolate as these can increase heartburn symptoms.

## 2022-04-02 NOTE — Patient Instructions (Addendum)
Welcome to our clinic, I am happy to have you as my new patient. I am excited to continue on this healthcare journey with you.  A referral was placed today for both psychiatry as well as lung cancer screening.  Please let us know if you have not heard back within 2 weeks about the referral.  Stop by the lab prior to leaving today. I will notify you of your results once received.   Please keep in mind Any my chart messages you send have up to a three business day turnaround for a response.  Phone calls may take up to a one full business day turnaround for a  response.   If you need a medication refill I recommend you request it through the pharmacy as this is easiest for [place] rather than sending a message and or phone call.   Due to recent changes in healthcare laws, you may see results of your imaging and/or laboratory studies on MyChart before I have had a chance to review them.  I understand that in some cases there may be results that are confusing or concerning to you. Please understand that not all results are received at the same time and often I may need to interpret multiple results in order to provide you with the best plan of care or course of treatment. Therefore, I ask that you please give me 2 business days to thoroughly review all your results before contacting my office for clarification. Should we see a critical lab result, you will be contacted sooner.   Please refer to this list, and call around to see which place may be best suited for you.  Let me know which one you would like to go with, and if needed I will place a referral for you.  Please remember to ask them if they take your insurance. Another option is to call your insurance and inquire about available covered psychiatrists, and make appointment with one covered.    [facility] located at [address], [place], [place], [place].  [phone].      [facility] located at  [place], [place], [place]. [phone].     [facility] located at [address] [place], [place].  [phone].     [facility] located at [place], [place], [place].  [phone].      [facility] located at [address] [place], [place], [place].  [phone].        [facility] located at [place]. [address] [place], [place], [place].  [phone].        [place] - 4 local practices located at:    [address] [place], [place].  [phone].    [place], [place], [place], [place], [place], [place], [place], [place], [place]. [phone].      [facility] located at [address], [place], [place], [place]. [phone].        [facility] located at [address] [place], [place] [address]- [phone]?      [facility] located at [address] [place], [place] [address] [phone]?      [facility] located [place], [place], [place] [address] = [phone]?      [facility] located at  [address] [place], [place] [address] = [phone]?      [facility] [address] [place], [place] [address] - [phone]   It was a pleasure seeing you today! Please do not hesitate to reach out with any questions and or concerns.  Regards,   Eugenia Pancoast FNP-C

## 2022-04-02 NOTE — Assessment & Plan Note (Signed)
Work on diet and exercise 

## 2022-04-02 NOTE — Assessment & Plan Note (Signed)
Smoking cessation instruction/counseling given:  counseled patient on the dangers of tobacco use, advised patient to stop smoking, and reviewed strategies to maximize success 

## 2022-04-02 NOTE — Assessment & Plan Note (Signed)
a1c today  Reviewed.  Continue metformin 500 mg po bid  Work on diabetic diet and exercise as tolerated. Yearly foot exam, and annual eye exam.

## 2022-04-02 NOTE — Assessment & Plan Note (Signed)
covid tested in office, negative.  

## 2022-04-02 NOTE — Assessment & Plan Note (Signed)
Pt on tramadol prn  Advised pt to f/u with neurosurgery or orthopedist for chronic care management as I do not prescribe chronic pain medication. Pt is followed by neurosurgery currently for chronic pain.

## 2022-04-02 NOTE — Assessment & Plan Note (Signed)
Take antibiotic as prescribed. Increase oral fluids. Pt to f/u if sx worsen and or fail to improve in 2-3 days. rx augmentin 875/125 mg po bid x 10 days  

## 2022-04-02 NOTE — Assessment & Plan Note (Signed)
Continue otc vitamin d supplementation

## 2022-04-02 NOTE — Assessment & Plan Note (Signed)
Long d/w pt on risks with high risk medications Careful use of tramadol, alprazolam, seroquel , flexeril, ambien These together can cause accidental overdose or serotonin syndrome or over sedation Advised pt do not take flexeril with these medications.  Try to take ambien right before bed, seroquel a few hours prior to bed.  Never take xanax with the other medications or pain medication.  Have advised pt to see psychiatry as well for possible change in medication to decrease risk.

## 2022-04-02 NOTE — Assessment & Plan Note (Signed)
Continue seroquel and sertraline 100 mg  Advised pt to see psychiatrist for ongoing management and care as I do not treat for bipolar  Pt aware and verbalized understanding

## 2022-04-02 NOTE — Assessment & Plan Note (Signed)
ambien 5 mg prn  Work on sleep hygiene

## 2022-04-02 NOTE — Assessment & Plan Note (Signed)
Xanax prn 

## 2022-04-02 NOTE — Assessment & Plan Note (Signed)
Continue levothyroxine 50 mcg  

## 2022-04-02 NOTE — Progress Notes (Signed)
Established Patient Office Visit  Subjective:  Patient ID: Kathleen Carr, female    DOB: [birth year]  Age: 51 y.o. MRN: [id number]  CC:  Chief Complaint  Patient presents with   Transitions Of Care    HPI Kathleen Carr is here for a transition of care visit.  Prior provider was: Laverna Peace, FNP Pt is with acute concerns.   Pt has been sick for about one week.  Has not covid tested.  With cough, dry non productive. With chest congestion. Some sob.  Does have asthma.  Some sore throat, some right ear pain and some slight nasal congestion.  No fever or chills, did feel hot last night.  Was taking mucinex without much relief.   chronic concerns:  Psychiatrist, bipolar: on seroquel 300 mg once daily as well as sertraline 100 mg po qd. Has been stable on her medications for years. She saw psychiatry in the past. Xanax 0.25 mg prn, has been taking a bit more lately due to a recent hand surgery that has been causing her pain which heightens her anxiety.   Sleep disorder: Ambien 5 mg once nightly, no sleep walking.  With ambien sleeps about 4-5 hours up every few hours.   DM2: metformin 500 mg twice daily. Does ok without GI effects.  Lab Results  Component Value Date   HGBA1C 6.7 (A) 04/02/2022     GERD: pantoprazole 40 mg once daily. Has seen Dr. Vicente Males in the past, had EGD and colonoscopy.   Urinary urgency, chronic: after hysteroscopy bladder tilted. Oxybutynin for some relief, but mild. Does not see urology anymore, has seen in the past probably about two to three years ago.   Allergic rhinitis: claritin.   Leukocytosis: has seen hemoc for this in the past.   Asthma: controlled. Albuterol inhaler, used about once every few days or so.  Cigarette smoker: 0.5 pack smoker for 42 years.   Chronic back pain: takes once nightly.was rammed by a donkey at home resulting inback injury, about two years ago. Herniated disc, had surgery. Does see neurosurgeon  Dr. Cooper Render,  sees her about once every 3-6 M. Trying to get her in with orthopedist surgeon for her right hip.   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   PTSD (post-traumatic stress disorder)    PTSD (post-traumatic stress disorder)    Thyroid disease     Past Surgical History:  Procedure Laterality Date   ABDOMINAL HYSTERECTOMY     complete   CARPAL TUNNEL RELEASE Right 03/12/2022   Procedure: RIGHT CARPAL TUNNEL RELEASE;  Surgeon: Sherilyn Cooter, MD;  Location: [place];  Service: Orthopedics;  Laterality: Right;   CHOLECYSTECTOMY     COLONOSCOPY WITH PROPOFOL N/A 02/05/2022   Procedure: COLONOSCOPY WITH PROPOFOL;  Surgeon: [name], MD;  Location: [facility] ENDOSCOPY;  Service: Gastroenterology;  Laterality: N/A;   ESOPHAGOGASTRODUODENOSCOPY N/A 02/05/2022   Procedure: ESOPHAGOGASTRODUODENOSCOPY (EGD);  Surgeon: [name], MD;  Location: [facility] ENDOSCOPY;  Service: Gastroenterology;  Laterality: N/A;   KNEE ARTHROSCOPY Bilateral    TONSILLECTOMY      Family History  Problem Relation Age of Onset   Hypertension  Mother    Thyroid disease Mother    Lung cancer Father 71       carcinoid, right lung   Ulcerative colitis Father    Glaucoma Maternal Grandmother    Renal Disease Maternal Grandfather    Diabetes Maternal Grandfather        Type2   Stroke Paternal Grandfather    Hypertension Paternal Grandfather     Social History   Socioeconomic History   Marital status: Married    Spouse name: Not on file   Number of children: Not on file   Years of education: Not on file   Highest education level: Not on file  Occupational History   Not on file  Tobacco Use   Smoking status: Some Days     Packs/day: 0.50    Years: 42.00    Pack years: 21.00    Types: Cigarettes    Last attempt to quit: 05/25/2020    Years since quitting: 1.8   Smokeless tobacco: Never   Tobacco comments:    0.5 PPD 01/28/2022     Smoked since she was 51 years old  Vaping Use   Vaping Use: Some days  Substance and Sexual Activity   Alcohol use: Not Currently   Drug use: Not Currently   Sexual activity: Yes    Partners: Female    Comment: married to wife  Other Topics Concern   Not on file  Social History Narrative   Not on file   Social Determinants of Health   Financial Resource Strain: Not on file  Food Insecurity: Not on file  Transportation Needs: Not on file  Physical Activity: Not on file  Stress: Not on file  Social Connections: Not on file  Intimate Partner Violence: Not on file    Outpatient Medications Prior to Visit  Medication Sig Dispense Refill   albuterol (PROAIR HFA) 108 (90 Base) MCG/ACT inhaler Inhale 1-2 puffs into the lungs every 6 (six) hours as needed for wheezing. 8.5 g 2   ALPRAZolam (XANAX) 0.25 MG tablet Take 1-2 tablets (0.25-0.5 mg total) by mouth 2 (two) times daily as needed for anxiety (will cause drowsiness.). 30 tablet 2   aspirin EC 81 MG tablet Take 81 mg by mouth daily. Swallow whole.     cholecalciferol (VITAMIN D3) 25 MCG (1000 UNIT) tablet Take 1,000 Units by mouth daily.     cyclobenzaprine (FLEXERIL) 10 MG tablet Take 1 tablet (10 mg total) by mouth 3 (three) times daily as needed for muscle spasms (will cause drowsiness.). 30 tablet 0   levothyroxine (SYNTHROID) 50 MCG tablet TAKE 1 TABLET BY MOUTH DAILY BEFORE BREAKFAST. 90 tablet 1   loratadine (CLARITIN) 10 MG tablet Take 10 mg by mouth daily.     metFORMIN (GLUCOPHAGE) 500 MG tablet Take 1 tablet (500 mg total) by mouth 2 (two) times daily with a meal. 180 tablet 3   oxybutynin (DITROPAN-XL) 10 MG 24 hr tablet Take 1 tablet (10 mg total) by mouth at bedtime. 90 tablet 1   pantoprazole (PROTONIX) 40  MG tablet Take 1 tablet (40 mg total) by mouth daily. 90 tablet 1   QUEtiapine (SEROQUEL) 300 MG tablet TAKE 1 TABLET (300 MG TOTAL) BY MOUTH AT BEDTIME. 90 tablet 1   rosuvastatin (CRESTOR) 5 MG tablet TAKE 1 TABLET BY MOUTH DAILY. 90 tablet 1   sertraline (ZOLOFT) 100 MG tablet TAKE 1 TABLET BY MOUTH DAILY 90 tablet 0   traMADol (ULTRAM) 50 MG tablet Take 1-2 tablets (  50-100 mg total) by mouth daily as needed (not to use with any other narcotic or cough syrup/ sedative.). 60 tablet 2   zolpidem (AMBIEN) 5 MG tablet TAKE 1 TABLET (5 MG TOTAL) BY MOUTH AT BEDTIME AS NEEDED FOR SLEEP. 30 tablet 0   promethazine-dextromethorphan (PROMETHAZINE-DM) 6.25-15 MG/5ML syrup Take 5 mLs by mouth at bedtime as needed for cough. 118 mL 1   No facility-administered medications prior to visit.    Allergies  Allergen Reactions   Dust Mite Mixed Allergen Ext [Mite (D. Farinae)]     Respiratory distresss   Other Hives    Allergy to [place]ory, walnut and Birch trees and all grasses and allergic to Rabbits- patient reports anaphylactic    Sulfa Antibiotics Hives        Objective:    Physical Exam Constitutional:      General: She is not in acute distress.    Appearance: Normal appearance. She is obese. She is not ill-appearing, toxic-appearing or diaphoretic.  HENT:     Right Ear: Tympanic membrane is erythematous and retracted.     Left Ear: Tympanic membrane is erythematous and retracted.     Nose: Nose normal. No congestion or rhinorrhea.     Right Turbinates: Not enlarged or swollen.     Left Turbinates: Not enlarged or swollen.     Right Sinus: No maxillary sinus tenderness or frontal sinus tenderness.     Left Sinus: No maxillary sinus tenderness or frontal sinus tenderness.     Mouth/Throat:     Mouth: Mucous membranes are moist.     Pharynx: Posterior oropharyngeal erythema present. No pharyngeal swelling or oropharyngeal exudate.     Tonsils: No tonsillar exudate.  Eyes:     Extraocular  Movements: Extraocular movements intact.     Conjunctiva/sclera: Conjunctivae normal.     Pupils: Pupils are equal, round, and reactive to light.  Neck:     Thyroid: No thyroid mass.  Cardiovascular:     Rate and Rhythm: Normal rate and regular rhythm.  Pulmonary:     Effort: Pulmonary effort is normal.     Breath sounds: Normal breath sounds.  Lymphadenopathy:     Cervical:     Right cervical: No superficial cervical adenopathy.    Left cervical: No superficial cervical adenopathy.  Neurological:     Mental Status: She is alert.     BP 106/60   Pulse 80   Temp 98.2 F (36.8 C)   Resp 16   Ht 5' 4"  (1.626 m)   Wt 179 lb 4 oz (81.3 kg)   SpO2 91%   BMI 30.77 kg/m  Wt Readings from Last 3 Encounters:  04/02/22 179 lb 4 oz (81.3 kg)  03/12/22 179 lb 10.8 oz (81.5 kg)  02/24/22 170 lb (77.1 kg)     Health Maintenance Due  Topic Date Due   FOOT EXAM  Never done   OPHTHALMOLOGY EXAM  Never done   PAP SMEAR-Modifier  Never done   COVID-19 Vaccine (4 - Booster for Pfizer series) 12/26/2020   MAMMOGRAM  Never done   Zoster Vaccines- Shingrix (1 of 2) Never done    There are no preventive care reminders to display for this patient.  Lab Results  Component Value Date   TSH 3.04 01/02/2022   Lab Results  Component Value Date   WBC 10.4 01/10/2022   HGB 13.8 01/10/2022   HCT 39.8 01/10/2022   MCV 93.3 01/10/2022   PLT 394.0 01/10/2022  Lab Results  Component Value Date   NA 138 03/10/2022   K 4.0 03/10/2022   CO2 27 03/10/2022   GLUCOSE 90 03/10/2022   BUN 12 03/10/2022   CREATININE 0.79 03/10/2022   BILITOT 0.4 01/10/2022   ALKPHOS 70 01/10/2022   AST 16 01/10/2022   ALT 20 01/10/2022   PROT 6.5 01/10/2022   ALBUMIN 4.0 01/10/2022   CALCIUM 9.1 03/10/2022   ANIONGAP 7 03/10/2022   EGFR 83 01/14/2021   GFR 71.77 01/10/2022   Lab Results  Component Value Date   CHOL 187 08/16/2021   Lab Results  Component Value Date   HDL 56.10 08/16/2021    Lab Results  Component Value Date   LDLCALC 106 (H) 08/02/2020   Lab Results  Component Value Date   TRIG 214.0 (H) 08/16/2021   Lab Results  Component Value Date   CHOLHDL 3 08/16/2021   Lab Results  Component Value Date   HGBA1C 6.7 (A) 04/02/2022      Assessment & Plan:   Problem List Items Addressed This Visit       Respiratory   Acute pharyngitis    Take antibiotic as prescribed. Increase oral fluids. Pt to f/u if sx worsen and or fail to improve in 2-3 days. rx augmentin 875/125 mg po bid x 10 days        Relevant Medications   amoxicillin-clavulanate (AUGMENTIN) 875-125 MG tablet     Digestive   Gastroesophageal reflux disease    Cont protonix 40 mg once daily Try to decrease and or avoid spicy foods, fried fatty foods, and also caffeine and chocolate as these can increase heartburn symptoms.           Endocrine   Controlled diabetes mellitus type 2 with complications ([place])    I5O today  Reviewed.  Continue metformin 500 mg po bid  Work on diabetic diet and exercise as tolerated. Yearly foot exam, and annual eye exam.         Relevant Medications   methylPREDNISolone (MEDROL DOSEPAK) 4 MG TBPK tablet   Other Relevant Orders   POCT glycosylated hemoglobin (Hb A1C) (Completed)   Hypothyroidism    Continue levothyroxine 50 mcg          Other   Chronic midline low back pain    Pt on tramadol prn  Advised pt to f/u with neurosurgery or orthopedist for chronic care management as I do not prescribe chronic pain medication. Pt is followed by neurosurgery currently for chronic pain.        Relevant Medications   methylPREDNISolone (MEDROL DOSEPAK) 4 MG TBPK tablet   Overweight (BMI 25.0-29.9)    Work on diet and exercise       Insomnia    ambien 5 mg prn  Work on sleep hygiene       Bipolar disorder, currently in remission ([place])    Continue seroquel and sertraline 100 mg  Advised pt to see psychiatrist for ongoing management and  care as I do not treat for bipolar  Pt aware and verbalized understanding       Relevant Orders   Ambulatory referral to Psychiatry   Situational anxiety    Xanax prn        Vitamin D deficiency    Continue otc vitamin d supplementation       Smoker    Smoking cessation instruction/counseling given:  counseled patient on the dangers of tobacco use, advised patient to stop smoking, and reviewed strategies  to maximize success        Relevant Orders   Ambulatory Referral Lung Cancer Screening [facility]   Tobacco abuse - Primary   Relevant Orders   Ambulatory Referral Lung Cancer Screening [facility]   Long-term use of high-risk medication    Long d/w pt on risks with high risk medications Careful use of tramadol, alprazolam, seroquel , flexeril, ambien These together can cause accidental overdose or serotonin syndrome or over sedation Advised pt do not take flexeril with these medications.  Try to take ambien right before bed, seroquel a few hours prior to bed.  Never take xanax with the other medications or pain medication.  Have advised pt to see psychiatry as well for possible change in medication to decrease risk.        Contact with and (suspected) exposure to covid-19    covid tested in office, negative       Relevant Orders   POC COVID-19 BinaxNow    Meds ordered this encounter  Medications   amoxicillin-clavulanate (AUGMENTIN) 875-125 MG tablet    Sig: Take 1 tablet by mouth 2 (two) times daily.    Dispense:  20 tablet    Refill:  0    Order Specific Question:   Supervising Provider    Answer:   BEDSOLE, Kandie E [[id number]]   methylPREDNISolone (MEDROL DOSEPAK) 4 MG TBPK tablet    Sig: Take per package instructions    Dispense:  21 tablet    Refill:  0    Order Specific Question:   Supervising Provider    Answer:   BEDSOLE, Marcha E [[id number]]    Follow-up: No follow-ups on file.    Eugenia Pancoast, FNP

## 2022-04-06 NOTE — Progress Notes (Deleted)
Cardiology Office Note  Date:  04/06/2022   ID:  RICCI PAFF, DOB January 10, 1971, MRN [id number]  PCP:  Eugenia Pancoast, FNP   No chief complaint on file.   HPI:  Kathleen Carr is a 51 year old woman with past medical history of Bipolar disorder Smoker GERD Who presents by referral from Laverna Peace for consultation of her abnormal EKG     PMH:   has a past medical history of Allergy, Anxiety, Arthritis, Asthma, Bipolar depression ([place]), Cat bite (01/14/2022), Depression, Diabetes mellitus without complication ([place]), GERD (gastroesophageal reflux disease), Hypertension, Hypothyroidism, Pasteurella infection (01/14/2022), PTSD (post-traumatic stress disorder), PTSD (post-traumatic stress disorder), and Thyroid disease.  PSH:    Past Surgical History:  Procedure Laterality Date   ABDOMINAL HYSTERECTOMY     complete   CARPAL TUNNEL RELEASE Right 03/12/2022   Procedure: RIGHT CARPAL TUNNEL RELEASE;  Surgeon: Sherilyn Cooter, MD;  Location: [place];  Service: Orthopedics;  Laterality: Right;   CHOLECYSTECTOMY     COLONOSCOPY WITH PROPOFOL N/A 02/05/2022   Procedure: COLONOSCOPY WITH PROPOFOL;  Surgeon: [name], MD;  [facility] ENDOSCOPY;  Service: Gastroenterology;  Laterality: N/A;   ESOPHAGOGASTRODUODENOSCOPY N/A 02/05/2022   Procedure: ESOPHAGOGASTRODUODENOSCOPY (EGD);  Surgeon: [name], MD;  Location: [facility] ENDOSCOPY;  Service: Gastroenterology;  Laterality: N/A;   KNEE ARTHROSCOPY Bilateral    TONSILLECTOMY      Current Outpatient Medications  Medication Sig Dispense Refill   albuterol (PROAIR HFA) 108 (90 Base) MCG/ACT inhaler Inhale 1-2 puffs into the lungs every 6 (six) hours as needed for wheezing. 8.5 g 2   ALPRAZolam (XANAX) 0.25 MG tablet Take 1-2 tablets (0.25-0.5 mg total) by mouth 2 (two) times daily as needed for anxiety (will cause drowsiness.). 30 tablet 2   amoxicillin-clavulanate (AUGMENTIN) 875-125 MG tablet Take 1 tablet by mouth  2 (two) times daily. 20 tablet 0   aspirin EC 81 MG tablet Take 81 mg by mouth daily. Swallow whole.     cholecalciferol (VITAMIN D3) 25 MCG (1000 UNIT) tablet Take 1,000 Units by mouth daily.     cyclobenzaprine (FLEXERIL) 10 MG tablet Take 1 tablet (10 mg total) by mouth 3 (three) times daily as needed for muscle spasms (will cause drowsiness.). 30 tablet 0   levothyroxine (SYNTHROID) 50 MCG tablet TAKE 1 TABLET BY MOUTH DAILY BEFORE BREAKFAST. 90 tablet 1   loratadine (CLARITIN) 10 MG tablet Take 10 mg by mouth daily.     metFORMIN (GLUCOPHAGE) 500 MG tablet Take 1 tablet (500 mg total) by mouth 2 (two) times daily with a meal. 180 tablet 3   methylPREDNISolone (MEDROL DOSEPAK) 4 MG TBPK tablet Take per package instructions 21 tablet 0   oxybutynin (DITROPAN-XL) 10 MG 24 hr tablet Take 1 tablet (10 mg total) by mouth at bedtime. 90 tablet 1   pantoprazole (PROTONIX) 40 MG tablet Take 1 tablet (40 mg total) by mouth daily. 90 tablet 1   QUEtiapine (SEROQUEL) 300 MG tablet TAKE 1 TABLET (300 MG TOTAL) BY MOUTH AT BEDTIME. 90 tablet 1   rosuvastatin (CRESTOR) 5 MG tablet TAKE 1 TABLET BY MOUTH DAILY. 90 tablet 1   sertraline (ZOLOFT) 100 MG tablet TAKE 1 TABLET BY MOUTH DAILY 90 tablet 0   traMADol (ULTRAM) 50 MG tablet Take 1-2 tablets (50-100 mg total) by mouth daily as needed (not to use with any other narcotic or cough syrup/ sedative.). 60 tablet 2   zolpidem (AMBIEN) 5 MG tablet TAKE 1 TABLET (5 MG TOTAL) BY MOUTH  AT BEDTIME AS NEEDED FOR SLEEP. 30 tablet 0   No current facility-administered medications for this visit.     Allergies:   Dust mite mixed allergen ext [mite (d. farinae)], Other, and Sulfa antibiotics   Social History:  The patient  reports that she has been smoking cigarettes. She has a 21.00 pack-year smoking history. She has never used smokeless tobacco. She reports that she does not currently use alcohol. She reports that she does not currently use drugs.   Family  History:   family history includes Diabetes in her maternal grandfather; Glaucoma in her maternal grandmother; Hypertension in her mother and paternal grandfather; Lung cancer (age of onset: 22) in her father; Renal Disease in her maternal grandfather; Stroke in her paternal grandfather; Thyroid disease in her mother; Ulcerative colitis in her father.    Review of Systems: ROS   PHYSICAL EXAM: VS:  There were no vitals taken for this visit. , BMI There is no height or weight on file to calculate BMI. GEN: Well nourished, well developed, in no acute distress HEENT: normal Neck: no JVD, carotid bruits, or masses Cardiac: RRR; no murmurs, rubs, or gallops,no edema  Respiratory:  clear to auscultation bilaterally, normal work of breathing GI: soft, nontender, nondistended, + BS MS: no deformity or atrophy Skin: warm and dry, no rash Neuro:  Strength and sensation are intact Psych: euthymic mood, full affect    Recent Labs: 01/02/2022: TSH 3.04 01/10/2022: ALT 20; Hemoglobin 13.8; Platelets 394.0 03/10/2022: BUN 12; Creatinine, Ser 0.79; Potassium 4.0; Sodium 138    Lipid Panel Lab Results  Component Value Date   CHOL 187 08/16/2021   HDL 56.10 08/16/2021   LDLCALC 106 (H) 08/02/2020   TRIG 214.0 (H) 08/16/2021      Wt Readings from Last 3 Encounters:  04/02/22 179 lb 4 oz (81.3 kg)  03/12/22 179 lb 10.8 oz (81.5 kg)  02/24/22 170 lb (77.1 kg)       ASSESSMENT AND PLAN:  Problem List Items Addressed This Visit   None    Disposition:   F/U  12 months   Total encounter time more than 30 minutes  Greater than 50% was spent in counseling and coordination of care with the patient    Signed, Esmond Plants, M.D., Ph.D. [place], [place]

## 2022-04-07 ENCOUNTER — Other Ambulatory Visit: Payer: Self-pay

## 2022-04-07 ENCOUNTER — Ambulatory Visit: Payer: 59 | Admitting: Cardiovascular Disease

## 2022-04-07 ENCOUNTER — Encounter (HOSPITAL_BASED_OUTPATIENT_CLINIC_OR_DEPARTMENT_OTHER): Payer: Self-pay | Admitting: Orthopedic Surgery

## 2022-04-07 ENCOUNTER — Other Ambulatory Visit: Payer: Self-pay | Admitting: Internal Medicine

## 2022-04-07 DIAGNOSIS — F3170 Bipolar disorder, currently in remission, most recent episode unspecified: Secondary | ICD-10-CM

## 2022-04-07 DIAGNOSIS — E663 Overweight: Secondary | ICD-10-CM

## 2022-04-07 DIAGNOSIS — E118 Type 2 diabetes mellitus with unspecified complications: Secondary | ICD-10-CM

## 2022-04-07 DIAGNOSIS — F17200 Nicotine dependence, unspecified, uncomplicated: Secondary | ICD-10-CM

## 2022-04-07 MED ORDER — LEVOTHYROXINE SODIUM 50 MCG PO TABS
ORAL_TABLET | Freq: Every day | ORAL | 1 refills | Status: DC
  Filled 2022-04-07 – 2022-04-08 (×2): qty 90, 90d supply, fill #0
  Filled 2022-07-08: qty 30, 30d supply, fill #1
  Filled 2022-08-06: qty 30, 30d supply, fill #2
  Filled 2022-09-05: qty 30, 30d supply, fill #3

## 2022-04-08 ENCOUNTER — Other Ambulatory Visit: Payer: Self-pay | Admitting: Internal Medicine

## 2022-04-08 ENCOUNTER — Other Ambulatory Visit: Payer: Self-pay

## 2022-04-08 DIAGNOSIS — R062 Wheezing: Secondary | ICD-10-CM

## 2022-04-08 MED ORDER — CYCLOBENZAPRINE HCL 10 MG PO TABS
10.0000 mg | ORAL_TABLET | Freq: Three times a day (TID) | ORAL | 0 refills | Status: DC | PRN
  Filled 2022-04-15: qty 30, 10d supply, fill #0

## 2022-04-15 ENCOUNTER — Other Ambulatory Visit: Payer: Self-pay | Admitting: Internal Medicine

## 2022-04-15 ENCOUNTER — Other Ambulatory Visit: Payer: Self-pay

## 2022-04-15 ENCOUNTER — Encounter (HOSPITAL_BASED_OUTPATIENT_CLINIC_OR_DEPARTMENT_OTHER)
Admission: RE | Admit: 2022-04-15 | Discharge: 2022-04-15 | Disposition: A | Discharge status: Home / Self Care | Payer: 59 | Source: Ambulatory Visit | Attending: Orthopedic Surgery | Admitting: Orthopedic Surgery

## 2022-04-15 DIAGNOSIS — M1990 Unspecified osteoarthritis, unspecified site: Secondary | ICD-10-CM | POA: Diagnosis not present

## 2022-04-15 DIAGNOSIS — Z01812 Encounter for preprocedural laboratory examination: Secondary | ICD-10-CM | POA: Diagnosis not present

## 2022-04-15 DIAGNOSIS — G4700 Insomnia, unspecified: Secondary | ICD-10-CM

## 2022-04-15 DIAGNOSIS — F17210 Nicotine dependence, cigarettes, uncomplicated: Secondary | ICD-10-CM | POA: Diagnosis not present

## 2022-04-15 DIAGNOSIS — I10 Essential (primary) hypertension: Secondary | ICD-10-CM | POA: Diagnosis not present

## 2022-04-15 DIAGNOSIS — Z8249 Family history of ischemic heart disease and other diseases of the circulatory system: Secondary | ICD-10-CM | POA: Diagnosis not present

## 2022-04-15 DIAGNOSIS — G5602 Carpal tunnel syndrome, left upper limb: Secondary | ICD-10-CM | POA: Diagnosis present

## 2022-04-15 DIAGNOSIS — Z833 Family history of diabetes mellitus: Secondary | ICD-10-CM | POA: Diagnosis not present

## 2022-04-15 DIAGNOSIS — E119 Type 2 diabetes mellitus without complications: Secondary | ICD-10-CM | POA: Diagnosis not present

## 2022-04-15 LAB — BASIC METABOLIC PANEL
Anion gap: 9 (ref 5–15)
BUN: 10 mg/dL (ref 6–20)
CO2: 25 mmol/L (ref 22–32)
Calcium: 9.4 mg/dL (ref 8.9–10.3)
Chloride: 105 mmol/L (ref 98–111)
Creatinine, Ser: 0.87 mg/dL (ref 0.44–1.00)
GFR, Estimated: >60 mL/min (ref >60)
Glucose, Bld: 104 mg/dL — ABNORMAL HIGH (ref 70–99)
Potassium: 4.6 mmol/L (ref 3.5–5.1)
Sodium: 139 mmol/L (ref 135–145)

## 2022-04-15 MED FILL — Tramadol HCl Tab 50 MG: ORAL | 30 days supply | Qty: 60 | Fill #2 | Status: AC

## 2022-04-15 MED FILL — Alprazolam Tab 0.25 MG: ORAL | 8 days supply | Qty: 30 | Fill #2 | Status: AC

## 2022-04-15 NOTE — Progress Notes (Addendum)
Sent text reminding pt to come for labwork.   Enhanced Recovery after Surgery  Enhanced Recovery after Surgery is a protocol used to improve the stress on your body and your recovery after surgery.  Patient Instructions  The night before surgery:  No food after midnight. ONLY clear liquids after midnight  The day of surgery (if you do NOT have diabetes):  Drink ONE (1) Pre-Surgery Clear Ensure as directed.   This drink was given to you during your hospital  pre-op appointment visit. The pre-op nurse will instruct you on the time to drink the  Pre-Surgery Ensure depending on your surgery time. Finish the drink at the designated time by the pre-op nurse.  Nothing else to drink after completing the  Pre-Surgery Clear Ensure.  The day of surgery (if you have diabetes): Drink ONE (1) Gatorade 2 (G2) as directed. This drink was given to you during your hospital  pre-op appointment visit.  The pre-op nurse will instruct you on the time to drink the   Gatorade 2 (G2) depending on your surgery time. Color of the Gatorade may vary. Red is not allowed. Nothing else to drink after completing the  Gatorade 2 (G2).         If office.you have questions, please contact your surgeon's office 

## 2022-04-16 ENCOUNTER — Encounter (HOSPITAL_BASED_OUTPATIENT_CLINIC_OR_DEPARTMENT_OTHER): Payer: Self-pay | Admitting: Orthopedic Surgery

## 2022-04-16 ENCOUNTER — Ambulatory Visit (HOSPITAL_BASED_OUTPATIENT_CLINIC_OR_DEPARTMENT_OTHER)
Admission: RE | Admit: 2022-04-16 | Discharge: 2022-04-16 | Disposition: A | Discharge status: Home / Self Care | Payer: 59 | Attending: Orthopedic Surgery | Admitting: Orthopedic Surgery

## 2022-04-16 ENCOUNTER — Encounter (HOSPITAL_BASED_OUTPATIENT_CLINIC_OR_DEPARTMENT_OTHER)
Admission: RE | Disposition: A | Discharge status: Home / Self Care | Payer: Self-pay | Source: Home / Self Care | Attending: Orthopedic Surgery

## 2022-04-16 ENCOUNTER — Other Ambulatory Visit: Payer: Self-pay

## 2022-04-16 ENCOUNTER — Other Ambulatory Visit: Payer: Self-pay | Admitting: Family

## 2022-04-16 ENCOUNTER — Ambulatory Visit (HOSPITAL_BASED_OUTPATIENT_CLINIC_OR_DEPARTMENT_OTHER): Payer: 59 | Admitting: Anesthesiology

## 2022-04-16 DIAGNOSIS — G5602 Carpal tunnel syndrome, left upper limb: Secondary | ICD-10-CM | POA: Diagnosis not present

## 2022-04-16 DIAGNOSIS — I10 Essential (primary) hypertension: Secondary | ICD-10-CM

## 2022-04-16 DIAGNOSIS — Z833 Family history of diabetes mellitus: Secondary | ICD-10-CM | POA: Insufficient documentation

## 2022-04-16 DIAGNOSIS — F17210 Nicotine dependence, cigarettes, uncomplicated: Secondary | ICD-10-CM

## 2022-04-16 DIAGNOSIS — E119 Type 2 diabetes mellitus without complications: Secondary | ICD-10-CM | POA: Diagnosis not present

## 2022-04-16 DIAGNOSIS — Z7984 Long term (current) use of oral hypoglycemic drugs: Secondary | ICD-10-CM

## 2022-04-16 DIAGNOSIS — Z8249 Family history of ischemic heart disease and other diseases of the circulatory system: Secondary | ICD-10-CM | POA: Insufficient documentation

## 2022-04-16 DIAGNOSIS — M1990 Unspecified osteoarthritis, unspecified site: Secondary | ICD-10-CM | POA: Insufficient documentation

## 2022-04-16 DIAGNOSIS — G5603 Carpal tunnel syndrome, bilateral upper limbs: Secondary | ICD-10-CM

## 2022-04-16 DIAGNOSIS — E118 Type 2 diabetes mellitus with unspecified complications: Secondary | ICD-10-CM

## 2022-04-16 HISTORY — PX: CARPAL TUNNEL RELEASE: SHX101

## 2022-04-16 HISTORY — DX: Nicotine dependence, unspecified, uncomplicated: F17.200

## 2022-04-16 HISTORY — DX: Carpal tunnel syndrome, unspecified upper limb: G56.00

## 2022-04-16 LAB — GLUCOSE, CAPILLARY
Glucose-Capillary: 112 mg/dL — ABNORMAL HIGH (ref 70–99)
Glucose-Capillary: 109 mg/dL — ABNORMAL HIGH (ref 70–99)

## 2022-04-16 SURGERY — CARPAL TUNNEL RELEASE
Anesthesia: Monitor Anesthesia Care | Laterality: Left

## 2022-04-16 MED ORDER — LACTATED RINGERS IV SOLN: INTRAVENOUS | Status: DC

## 2022-04-16 MED ORDER — PROMETHAZINE HCL 25 MG/ML IJ SOLN: 6.2500 mg | INTRAMUSCULAR | Status: DC | PRN

## 2022-04-16 MED ORDER — HYDROMORPHONE HCL 1 MG/ML IJ SOLN
INTRAMUSCULAR | Status: AC
  Filled 2022-04-16: qty 0.5

## 2022-04-16 MED ORDER — LIDOCAINE-EPINEPHRINE (PF) 1 %-1:200000 IJ SOLN
INTRAMUSCULAR | Status: AC
  Filled 2022-04-16: qty 180

## 2022-04-16 MED ORDER — PROPOFOL 10 MG/ML IV BOLUS
INTRAVENOUS | Status: DC | PRN
  Administered 2022-04-16: 10 mg via INTRAVENOUS

## 2022-04-16 MED ORDER — OXYCODONE HCL 5 MG PO TABS
5.0000 mg | ORAL_TABLET | ORAL | 0 refills | Status: AC | PRN
  Filled 2022-04-16: qty 12, 2d supply, fill #0

## 2022-04-16 MED ORDER — OXYCODONE HCL 5 MG PO TABS
5.0000 mg | ORAL_TABLET | Freq: Once | ORAL | Status: AC | PRN
  Administered 2022-04-16: 5 mg via ORAL

## 2022-04-16 MED ORDER — LIDOCAINE HCL 1 % IJ SOLN
INTRAMUSCULAR | Status: DC | PRN
  Administered 2022-04-16: 10 mL

## 2022-04-16 MED ORDER — FENTANYL CITRATE (PF) 100 MCG/2ML IJ SOLN
INTRAMUSCULAR | Status: DC | PRN
  Administered 2022-04-16: 50 ug via INTRAVENOUS

## 2022-04-16 MED ORDER — LIDOCAINE HCL 1 % IJ SOLN
INTRAMUSCULAR | Status: DC | PRN
  Administered 2022-04-16: 50 mg via INTRADERMAL

## 2022-04-16 MED ORDER — OXYCODONE HCL 5 MG PO TABS
ORAL_TABLET | ORAL | Status: AC
  Filled 2022-04-16: qty 1

## 2022-04-16 MED ORDER — OXYCODONE HCL 5 MG/5ML PO SOLN: 5.0000 mg | Freq: Once | ORAL | Status: AC | PRN

## 2022-04-16 MED ORDER — PROPOFOL 500 MG/50ML IV EMUL
INTRAVENOUS | Status: DC | PRN
  Administered 2022-04-16: 50 ug/kg/min via INTRAVENOUS

## 2022-04-16 MED ORDER — BUPIVACAINE HCL (PF) 0.25 % IJ SOLN
INTRAMUSCULAR | Status: AC
  Filled 2022-04-16: qty 180

## 2022-04-16 MED ORDER — HYDROMORPHONE HCL 1 MG/ML IJ SOLN
0.2500 mg | INTRAMUSCULAR | Status: DC | PRN
  Administered 2022-04-16: 0.5 mg via INTRAVENOUS

## 2022-04-16 MED ORDER — LIDOCAINE HCL (PF) 1 % IJ SOLN
INTRAMUSCULAR | Status: AC
  Filled 2022-04-16: qty 90

## 2022-04-16 MED ORDER — MIDAZOLAM HCL 5 MG/5ML IJ SOLN
INTRAMUSCULAR | Status: DC | PRN
  Administered 2022-04-16 (×2): 1 mg via INTRAVENOUS

## 2022-04-16 SURGICAL SUPPLY — 46 items
APL PRP STRL LF DISP 70% ISPRP (MISCELLANEOUS) ×1
BLADE SURG 15 STRL LF DISP TIS (BLADE) ×1 IMPLANT
BLADE SURG 15 STRL SS (BLADE) ×2
BNDG CMPR 9X4 STRL LF SNTH (GAUZE/BANDAGES/DRESSINGS) ×1
BNDG ELASTIC 3X5.8 VLCR STR LF (GAUZE/BANDAGES/DRESSINGS) ×2 IMPLANT
BNDG ESMARK 4X9 LF (GAUZE/BANDAGES/DRESSINGS) ×2 IMPLANT
BNDG GAUZE DERMACEA FLUFF (GAUZE/BANDAGES/DRESSINGS) ×1
BNDG GAUZE DERMACEA FLUFF 4 (GAUZE/BANDAGES/DRESSINGS) ×1 IMPLANT
BNDG GZE DERMACEA 4 6PLY (GAUZE/BANDAGES/DRESSINGS) ×1
BNDG PLASTER X FAST 3X3 WHT LF (CAST SUPPLIES) IMPLANT
BNDG PLSTR 9X3 FST ST WHT (CAST SUPPLIES)
CHLORAPREP W/TINT 26 (MISCELLANEOUS) ×2 IMPLANT
CORD BIPOLAR FORCEPS 12FT (ELECTRODE) ×2 IMPLANT
COVER BACK TABLE 60X90IN (DRAPES) ×2 IMPLANT
COVER MAYO STAND STRL (DRAPES) ×2 IMPLANT
CUFF TOURN SGL QUICK 18 NS (TOURNIQUET CUFF) ×1 IMPLANT
CUFF TOURN SGL QUICK 18X4 (TOURNIQUET CUFF) IMPLANT
CUFF TOURN SGL QUICK 24 (TOURNIQUET CUFF)
CUFF TRNQT CYL 24X4X16.5-23 (TOURNIQUET CUFF) IMPLANT
DRAPE EXTREMITY T 121X128X90 (DISPOSABLE) ×2 IMPLANT
DRAPE SURG 17X23 STRL (DRAPES) ×2 IMPLANT
DRAPE U-SHAPE 48X52 POLY STRL (PACKS) ×1 IMPLANT
GAUZE XEROFORM 1X8 LF (GAUZE/BANDAGES/DRESSINGS) IMPLANT
GLOVE BIO SURGEON STRL SZ7 (GLOVE) ×2 IMPLANT
GLOVE BIOGEL PI IND STRL 7.0 (GLOVE) ×1 IMPLANT
GLOVE BIOGEL PI INDICATOR 7.0 (GLOVE) ×1
GOWN STRL REUS W/ TWL LRG LVL3 (GOWN DISPOSABLE) ×1 IMPLANT
GOWN STRL REUS W/TWL LRG LVL3 (GOWN DISPOSABLE) ×2
GOWN STRL REUS W/TWL XL LVL3 (GOWN DISPOSABLE) ×2 IMPLANT
NDL HYPO 25X1 1.5 SAFETY (NEEDLE) IMPLANT
NEEDLE HYPO 25X1 1.5 SAFETY (NEEDLE) IMPLANT
NS IRRIG 1000ML POUR BTL (IV SOLUTION) ×2 IMPLANT
PACK BASIN DAY SURGERY FS (CUSTOM PROCEDURE TRAY) ×2 IMPLANT
PAD CAST 3X4 CTTN HI CHSV (CAST SUPPLIES) ×1 IMPLANT
PADDING CAST COTTON 3X4 STRL (CAST SUPPLIES) ×2
SLEEVE SCD COMPRESS KNEE MED (STOCKING) IMPLANT
SUCTION FRAZIER HANDLE 10FR (MISCELLANEOUS)
SUCTION TUBE FRAZIER 10FR DISP (MISCELLANEOUS) IMPLANT
SUT ETHILON 4 0 PS 2 18 (SUTURE) ×2 IMPLANT
SUT MNCRL AB 3-0 PS2 18 (SUTURE) IMPLANT
SUT VICRYL 4-0 PS2 18IN ABS (SUTURE) IMPLANT
SYR BULB EAR ULCER 3OZ GRN STR (SYRINGE) ×2 IMPLANT
SYR CONTROL 10ML LL (SYRINGE) IMPLANT
TOWEL GREEN STERILE FF (TOWEL DISPOSABLE) ×4 IMPLANT
TUBE CONNECTING 20X1/4 (TUBING) IMPLANT
UNDERPAD 30X36 HEAVY ABSORB (UNDERPADS AND DIAPERS) ×2 IMPLANT

## 2022-04-16 NOTE — Anesthesia Postprocedure Evaluation (Signed)
Anesthesia Post Note  Patient: Kathleen Carr  Procedure(s) Performed: LEFT CARPAL TUNNEL RELEASE (Left)     Patient location during evaluation: PACU Anesthesia Type: MAC Level of consciousness: awake and alert Pain management: pain level controlled Vital Signs Assessment: post-procedure vital signs reviewed and stable Respiratory status: spontaneous breathing, nonlabored ventilation and respiratory function stable Cardiovascular status: blood pressure returned to baseline and stable Postop Assessment: no apparent nausea or vomiting Anesthetic complications: no   No notable events documented.  Last Vitals:  Vitals:   04/16/22 0930 04/16/22 0951  BP: 104/68 104/63  Pulse: 73 74  Resp: 16 16  Temp:  36.6 C  SpO2: 97% 95%    Last Pain:  Vitals:   04/16/22 0951  TempSrc: Oral  PainSc: 0-No pain                 Lynda Rainwater

## 2022-04-16 NOTE — Telephone Encounter (Signed)
As we discussed in our visit pt is to continue refills and care with psychiatry for her psychiatric meds to include seroquel, sertraline, ambien, and xanax as I do not treat bipolar and pt at high risk for polypharmacy. Also I do not treat for chronic pain with tramadol as also discussed. Has pt made appt with psychiatry yet?

## 2022-04-16 NOTE — Discharge Instructions (Addendum)
 Charles Benfield, M.D. Hand Surgery  POST-OPERATIVE DISCHARGE INSTRUCTIONS   PRESCRIPTIONS: - You have been given a prescription to be taken as directed for post-operative pain control.  You may also take over the counter ibuprofen/aleve and tylenol for pain. Take this as directed on the packaging. Do not exceed 3000 mg tylenol/acetaminophen in 24 hours.  Ibuprofen 600-800 mg (3-4) tablets by mouth every 6 hours as needed for pain.   OR  Aleve 2 tablets by mouth every 12 hours (twice daily) as needed for pain.   AND/OR  Tylenol 1000 mg (2 tablets) every 8 hours as needed for pain.  - Please use your pain medication carefully, as refills are limited and you may not be provided with one.  As stated above, please use over the counter pain medicine - it will also be helpful with decreasing your swelling.    ANESTHESIA: -After your surgery, post-surgical discomfort or pain is likely. This discomfort can last several days to a few weeks. At certain times of the day your discomfort may be more intense.   Did you receive a nerve block?   - A nerve block can provide pain relief for one hour to two days after your surgery. As long as the nerve block is working, you will experience little or no sensation in the area the surgeon operated on.  - As the nerve block wears off, you will begin to experience pain or discomfort. It is very important that you begin taking your prescribed pain medication before the nerve block fully wears off. Treating your pain at the first sign of the block wearing off will ensure your pain is better controlled and more tolerable when full-sensation returns. Do not wait until the pain is intolerable, as the medicine will be less effective. It is better to treat pain in advance than to try and catch up.   General Anesthesia:  If you did not receive a nerve block during your surgery, you will need to start taking your pain medication shortly after your surgery and  should continue to do so as prescribed by your surgeon.     ICE AND ELEVATION: - You may use ice for the first 48-72 hours, but it is not critical.   - Motion of your fingers is very important to decrease the swelling.  - Elevation, as much as possible for the next 48 hours, is critical for decreasing swelling as well as for pain relief. Elevation means when you are seated or lying down, you hand should be at or above your heart. When walking, the hand needs to be at or above the level of your elbow.  - If the bandage gets too tight, it may need to be loosened. Please contact our office and we will instruct you in how to do this.    SURGICAL BANDAGES:  - Keep your dressing and/or splint clean and dry at all times.  You can remove your dressing 4 days from now and change with a dry dressing or Band-Aids as needed thereafter. - You may place a plastic bag over your bandage to shower, but be careful, do not get your bandages wet.  - After the bandages have been removed, it is OK to get the stitches wet in a shower or with hand washing. Do Not soak or submerge the wound yet. Please do not use lotions or creams on the stitches.      HAND THERAPY:  - You may not need any. If you do,   we will begin this at your follow up visit in the clinic.    ACTIVITY AND WORK: - You are encouraged to move any fingers which are not in the bandage.  - Light use of the fingers is allowed to assist the other hand with daily hygiene and eating, but strong gripping or lifting is often uncomfortable and should be avoided.  - You might miss a variable period of time from work and hopefully this issue has been discussed prior to surgery. You may not do any heavy work with your affected hand for about 2 weeks.    [facility] [address] [place],  [place]  [address] [phone]    Post Anesthesia Home Care Instructions  Activity: Get plenty of rest for the remainder of the day. A  responsible individual must stay with you for 24 hours following the procedure.  For the next 24 hours, DO NOT: -Drive a car -Paediatric nurse -Drink alcoholic beverages -Take any medication unless instructed by your physician -Make any legal decisions or sign important papers.  Meals: Start with liquid foods such as gelatin or soup. Progress to regular foods as tolerated. Avoid greasy, spicy, heavy foods. If nausea and/or vomiting occur, drink only clear liquids until the nausea and/or vomiting subsides. Call your physician if vomiting continues.  Special Instructions/Symptoms: Your throat may feel dry or sore from the anesthesia or the breathing tube placed in your throat during surgery. If this causes discomfort, gargle with warm salt water. The discomfort should disappear within 24 hours.  If you had a scopolamine patch placed behind your ear for the management of post- operative nausea and/or vomiting:  1. The medication in the patch is effective for 72 hours, after which it should be removed.  Wrap patch in a tissue and discard in the trash. Wash hands thoroughly with soap and water. 2. You may remove the patch earlier than 72 hours if you experience unpleasant side effects which may include dry mouth, dizziness or visual disturbances. 3. Avoid touching the patch. Wash your hands with soap and water after contact with the patch.     Oxycodone '5mg'$  given at 10:10am today 04/16/22

## 2022-04-16 NOTE — Interval H&P Note (Signed)
History and Physical Interval Note:  04/16/2022 8:11 AM  Kathleen Carr  has presented today for surgery, with the diagnosis of LEFT CARPAL TUNNEL SYNDROME.  The various methods of treatment have been discussed with the patient and family. After consideration of risks, benefits and other options for treatment, the patient has consented to  Procedure(s): LEFT CARPAL TUNNEL RELEASE (Left) as a surgical intervention.  The patient's history has been reviewed, patient examined, no change in status, stable for surgery.  I have reviewed the patient's chart and labs.  Questions were answered to the patient's satisfaction.      Henleigh Robello  Gen: NAD, resting comfortably CV: Normal rate, BUE warm and well perfused Pulm: Normal WOB on RA L hand: Skin warm and dry, no lesions, positive provocative signs

## 2022-04-16 NOTE — Anesthesia Preprocedure Evaluation (Signed)
Anesthesia Evaluation  Patient identified by MRN, date of birth, ID band Patient awake    Reviewed: Allergy & Precautions, H&P , NPO status , Patient's Chart, lab work & pertinent test results, reviewed documented beta blocker date and time   Airway Mallampati: II  TM Distance: >3 FB Neck ROM: full    Dental  (+) Dental Advidsory Given, Caps, Partial Lower, Missing, Teeth Intact   Pulmonary neg shortness of breath, asthma , neg sleep apnea, neg COPD, Recent URI , Resolved, Current Smoker and Patient abstained from smoking.,    Pulmonary exam normal        Cardiovascular Exercise Tolerance: Good hypertension, (-) angina(-) Past MI and (-) Cardiac Stents Normal cardiovascular exam(-) dysrhythmias (-) Valvular Problems/Murmurs     Neuro/Psych PSYCHIATRIC DISORDERS Anxiety Depression Bipolar Disorder  Neuromuscular disease negative neurological ROS     GI/Hepatic Neg liver ROS, GERD  Medicated and Controlled,  Endo/Other  diabetes, Type 2, Oral Hypoglycemic AgentsHypothyroidism   Renal/GU negative Renal ROS  negative genitourinary   Musculoskeletal  (+) Arthritis , Osteoarthritis,    Abdominal (+) + obese,   Peds  Hematology negative hematology ROS (+)   Anesthesia Other Findings    Reproductive/Obstetrics negative OB ROS                             Anesthesia Physical  Anesthesia Plan  ASA: 3  Anesthesia Plan: MAC   Post-op Pain Management: Minimal or no pain anticipated   Induction: Intravenous  PONV Risk Score and Plan: 1 and Propofol infusion  Airway Management Planned: Natural Airway, Nasal Cannula and Simple Face Mask  Additional Equipment: None  Intra-op Plan:   Post-operative Plan:   Informed Consent: I have reviewed the patients History and Physical, chart, labs and discussed the procedure including the risks, benefits and alternatives for the proposed anesthesia with the  patient or authorized representative who has indicated his/her understanding and acceptance.       Plan Discussed with: CRNA  Anesthesia Plan Comments:         Anesthesia Quick Evaluation

## 2022-04-16 NOTE — Transfer of Care (Signed)
Immediate Anesthesia Transfer of Care Note  Patient: Kathleen Carr  Procedure(s) Performed: LEFT CARPAL TUNNEL RELEASE (Left)  Patient Location: PACU  Anesthesia Type:MAC  Level of Consciousness: awake, alert , oriented and patient cooperative  Airway & Oxygen Therapy: Patient Spontanous Breathing and Patient connected to face mask oxygen  Post-op Assessment: Report given to RN and Post -op Vital signs reviewed and stable  Post vital signs: Reviewed and stable  Last Vitals:  Vitals Value Taken Time  BP 114/66 04/16/22 0913  Temp    Pulse 70 04/16/22 0913  Resp 13 04/16/22 0913  SpO2 100 % 04/16/22 0913    Last Pain:  Vitals:   04/16/22 0713  TempSrc: Oral  PainSc: 0-No pain      Patients Stated Pain Goal: 3 (72/25/75 0518)  Complications: No notable events documented.

## 2022-04-16 NOTE — Op Note (Signed)
   Date of Surgery: 04/16/2022  INDICATIONS: Patient is a 51 y.o.-year-old female with left carpal tunnel syndrome that was confirmed on electrodiagnostic studies and has failed conservative management.  Risks, benefits, and alternatives to surgery were again discussed with the patient in the preoperative area. The patient wishes to proceed with surgery.  Informed consent was signed after our discussion.   PREOPERATIVE DIAGNOSIS:  Left carpal tunnel syndrome  POSTOPERATIVE DIAGNOSIS: Same.  PROCEDURE:  Left carpal tunnel release   SURGEON: Audria Nine, M.D.  ASSIST:   ANESTHESIA:  Local, MAC  IV FLUIDS AND URINE: See anesthesia.  ESTIMATED BLOOD LOSS: <5 mL.  IMPLANTS: * No implants in log *   DRAINS: None  COMPLICATIONS: None  DESCRIPTION OF PROCEDURE: The patient was met in the preoperative holding area where the surgical site was marked and the consent form was verified.  The patient was then taken to the operating room and transferred to the operating table.  All bony prominences were well padded.  A tourniquet was applied to the left forearm.  Monitored sedation  was induced.  A formal time-out was performed to confirm that this was the correct patient, surgery, side, and site.   Following timeout, a local block was performed using 1% plain lidocaine.  The operative extremity was then prepped and draped in the usual and sterile fashion.    Following a second timeout, the limb was exsanguinated and the tourniquet inflated to 250 mmHg.  A longitudinal incision was made in line with the radial border of the ring finger from distal to the wrist flexion crease to the intersection of Kaplan's cardinal line.  The skin and subcutaneous tissue was sharply divided.  The longitudinally running palmar fascia was incised.  The thenar musculature was bluntly swept off of the transverse carpal ligament.  The ligament was divided from proximal to distal until the fat surrounding the palmar  arch was encountered. Retractors were then placed in the proximal aspect of the wound to visualize the distal antebrachial fascia.  The fascia was sharply divided under direct visualization.   The wound was then thoroughly irrigated with sterile saline.  The tourniquet was deflated.  Hemostasis was achieved with direct pressure and bipolar electrocautery.  The wound was then closed with 4-0 nylon sutures in a horizontal mattress fashion. The wound was then dressed with xeroform, folded kerlix, and an ace wrap.  The patient was then reversed from anesthesia and transferred to the postoperative bed.  All counts were correct x 2 at the end of the procedure.  The patient was taken to the recovery unit in stable condition.      POSTOPERATIVE PLAN: She will be discharged to home with appropriate pain medication and discharge instructions.  I will see her back in the office in 10-14 days for her first postop visit.   Audria Nine, MD 9:07 AM

## 2022-04-17 ENCOUNTER — Other Ambulatory Visit: Payer: Self-pay | Admitting: Internal Medicine

## 2022-04-17 ENCOUNTER — Encounter (HOSPITAL_BASED_OUTPATIENT_CLINIC_OR_DEPARTMENT_OTHER): Payer: Self-pay | Admitting: Orthopedic Surgery

## 2022-04-17 DIAGNOSIS — G4700 Insomnia, unspecified: Secondary | ICD-10-CM

## 2022-04-18 ENCOUNTER — Other Ambulatory Visit: Payer: Self-pay

## 2022-04-21 ENCOUNTER — Other Ambulatory Visit: Payer: Self-pay | Admitting: Internal Medicine

## 2022-04-21 DIAGNOSIS — G4700 Insomnia, unspecified: Secondary | ICD-10-CM

## 2022-04-22 ENCOUNTER — Other Ambulatory Visit: Payer: Self-pay

## 2022-04-22 ENCOUNTER — Other Ambulatory Visit: Payer: Self-pay | Admitting: Internal Medicine

## 2022-04-22 DIAGNOSIS — G4700 Insomnia, unspecified: Secondary | ICD-10-CM

## 2022-04-25 ENCOUNTER — Ambulatory Visit (INDEPENDENT_AMBULATORY_CARE_PROVIDER_SITE_OTHER): Payer: 59 | Admitting: Orthopedic Surgery

## 2022-04-25 ENCOUNTER — Other Ambulatory Visit: Payer: Self-pay

## 2022-04-25 DIAGNOSIS — G5602 Carpal tunnel syndrome, left upper limb: Secondary | ICD-10-CM

## 2022-04-25 MED FILL — Zolpidem Tartrate Tab 5 MG: ORAL | 30 days supply | Qty: 30 | Fill #0 | Status: AC

## 2022-04-25 NOTE — Progress Notes (Signed)
Post-Op Visit Note   Patient: Kathleen Carr           Date of Birth: 1971/05/12           MRN: [id number] Visit Date: 04/25/2022 PCP: Eugenia Pancoast, FNP   Assessment & Plan:  Chief Complaint:  Chief Complaint  Patient presents with   Left Wrist - Routine Post Op    Carpal Tunnel Release 04/16/22   Visit Diagnoses:  1. Carpal tunnel syndrome, left upper limb     Plan: Patient is 9 days s/p L CTR.  Shes doing well postoperatively.  Her incision is clean, dry, and well approximated.  The nylon sutures were removed.  Her previously numbness, paresthesias, and nocturnal symptoms have resolved.  She has intact thenar motor function. I can see her back again as needed.   Follow-Up Instructions: No follow-ups on file.   Orders:  No orders of the defined types were placed in this encounter.  No orders of the defined types were placed in this encounter.   Imaging: No results found.  PMFS History: Patient Active Problem List   Diagnosis Date Noted   Carpal tunnel syndrome, left upper limb    Acute pharyngitis 04/02/2022   Smoker 04/02/2022   Tobacco abuse 04/02/2022   Long-term use of high-risk medication 04/02/2022   Contact with and (suspected) exposure to covid-19 04/02/2022   Situational anxiety 11/21/2021   Vitamin D deficiency 11/21/2021   Hypothyroidism 11/21/2021   Carpal tunnel syndrome, right upper limb 11/02/2021   Bipolar disorder, currently in remission ([place]) 07/21/2021   Controlled diabetes mellitus type 2 with complications ([place]) 08/23/2535   Gastroesophageal reflux disease 03/06/2021   Leukocytosis 01/21/2021   Insomnia 01/21/2021   Chronic midline low back pain 08/05/2020   Overweight (BMI 25.0-29.9) 08/05/2020   Past Medical History:  Diagnosis Date   Allergy    Anxiety    Arthritis    Asthma    Bipolar depression ([place])    Carpal tunnel syndrome    Cat bite 01/14/2022   Depression    Diabetes mellitus without complication (HCC)    GERD  (gastroesophageal reflux disease)    Hypertension    Hypothyroidism    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   PTSD (post-traumatic stress disorder)    PTSD (post-traumatic stress disorder)    Smoker    Thyroid disease     Family History  Problem Relation Age of Onset   Hypertension Mother    Thyroid disease Mother    Lung cancer Father 23       carcinoid, right lung   Ulcerative colitis Father    Glaucoma Maternal Grandmother    Renal Disease Maternal Grandfather    Diabetes Maternal Grandfather        Type2   Stroke Paternal Grandfather    Hypertension Paternal Grandfather     Past Surgical History:  Procedure Laterality Date   ABDOMINAL HYSTERECTOMY     complete   CARPAL TUNNEL RELEASE Right 03/12/2022   Procedure: RIGHT CARPAL TUNNEL RELEASE;  Surgeon: Sherilyn Cooter, MD;  Location: [place];  Service: Orthopedics;  Laterality: Right;   CARPAL TUNNEL RELEASE Left 04/16/2022   Procedure: LEFT CARPAL TUNNEL RELEASE;  Surgeon: Sherilyn Cooter, MD;  Location: [place];  Service: Orthopedics;  Laterality: Left;   CHOLECYSTECTOMY     COLONOSCOPY WITH PROPOFOL N/A 02/05/2022   Procedure: COLONOSCOPY WITH PROPOFOL;  Surgeon: [name], MD;  Location: [facility] ENDOSCOPY;  Service: Gastroenterology;  Laterality: N/A;  ESOPHAGOGASTRODUODENOSCOPY N/A 02/05/2022   Procedure: ESOPHAGOGASTRODUODENOSCOPY (EGD);  Surgeon: [name], MD;  Location[facility] ENDOSCOPY;  Service: Gastroenterology;  Laterality: N/A;   KNEE ARTHROSCOPY Bilateral    TONSILLECTOMY     Social History   Occupational History   Not on file  Tobacco Use   Smoking status: Some Days    Packs/day: 0.50    Years: 42.00    Total pack years: 21.00    Types: Cigarettes    Last attempt to quit: 05/25/2020    Years since quitting: 1.9   Smokeless tobacco: Former   Tobacco comments:    0.5 PPD 01/28/2022     Smoked since she was 51 years old  Vaping Use   Vaping Use: Former  Substance  and Sexual Activity   Alcohol use: Not Currently   Drug use: Not Currently   Sexual activity: Yes    Partners: Female    Comment: married to wife

## 2022-04-28 ENCOUNTER — Encounter: Payer: 59 | Admitting: Orthopedic Surgery

## 2022-05-05 ENCOUNTER — Other Ambulatory Visit: Payer: Self-pay

## 2022-05-05 ENCOUNTER — Ambulatory Visit: Payer: 59 | Admitting: Gastroenterology

## 2022-05-05 NOTE — Progress Notes (Deleted)
[name] MD, MRCP(U.K) [address]  [place]  [place], [place] [address]  Main: [phone]  Fax: [phone]   Primary Care Physician: Eugenia Pancoast, [place]  Primary Gastroenterologist:  Dr. [name]   No chief complaint on file.   HPI: Kathleen Carr is a 51 y.o. female   Summary of history :  Initially referred and seen on 01/29/2022 for GERD ongoing for many years. Symptoms are usually when she lays flat at night.  Can wake up in the middle of the night with symptoms of heartburn.  She has been on Protonix for many years which used to work well but recently has not been working.  Takes it 30 minutes before breakfast.  Has a few episodes of symptoms every week.  Also been having some difficulty swallowing meat at times.  No family history of esophageal cancer.  She is a smoker.  Has gas and bloating abdominal distention at times.  At times she does suffer from constipation has not tried any over-the-counter agents.  Never had a colonoscopy no change in bowel habits no rectal bleeding no family history of colon cancer or polyps.   01/13/2022 CT abdomen pelvis without contrast showed no acute findings. 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  Colonoscopy : polyps x 4 resected , adenomas x 2.   ***   Current Outpatient Medications  Medication Sig Dispense Refill   albuterol (PROAIR HFA) 108 (90 Base) MCG/ACT inhaler Inhale 1-2 puffs into the lungs every 6 (six) hours as needed for wheezing. 8.5 g 2   ALPRAZolam (XANAX) 0.25 MG tablet Take 1-2 tablets (0.25-0.5 mg total) by mouth 2 (two) times daily as needed for anxiety (will cause drowsiness.). 30 tablet 2   aspirin EC 81 MG tablet Take 81 mg by mouth daily. Swallow whole.     cholecalciferol (VITAMIN D3) 25 MCG (1000 UNIT) tablet Take 1,000 Units by mouth daily.     cyclobenzaprine  (FLEXERIL) 10 MG tablet Take 1 tablet (10 mg total) by mouth 3 (three) times daily as needed for muscle spasms (will cause drowsiness.). 30 tablet 0   levothyroxine (SYNTHROID) 50 MCG tablet TAKE 1 TABLET BY MOUTH DAILY BEFORE BREAKFAST. 90 tablet 1   loratadine (CLARITIN) 10 MG tablet Take 10 mg by mouth daily.     metFORMIN (GLUCOPHAGE) 500 MG tablet Take 1 tablet (500 mg total) by mouth 2 (two) times daily with a meal. 180 tablet 3   oxybutynin (DITROPAN-XL) 10 MG 24 hr tablet Take 1 tablet (10 mg total) by mouth at bedtime. 90 tablet 1   pantoprazole (PROTONIX) 40 MG tablet Take 1 tablet (40 mg total) by mouth daily. 90 tablet 1   QUEtiapine (SEROQUEL) 300 MG tablet TAKE 1 TABLET (300 MG TOTAL) BY MOUTH AT BEDTIME. 90 tablet 1   rosuvastatin (CRESTOR) 5 MG tablet TAKE 1 TABLET BY MOUTH DAILY. 90 tablet 1   sertraline (ZOLOFT) 100 MG tablet TAKE 1 TABLET BY MOUTH DAILY 90 tablet 0   zolpidem (AMBIEN) 5 MG tablet TAKE 1 TABLET (5 MG TOTAL) BY MOUTH AT BEDTIME AS NEEDED FOR SLEEP. 30 tablet 0   No current facility-administered medications for this visit.    Allergies as of 05/05/2022 - Review Complete 04/25/2022  Allergen Reaction Noted   Dust mite mixed allergen ext [mite (d. farinae)]  08/01/2020   Other  Hives 08/01/2020   Sulfa antibiotics Hives 06/24/2020    ROS:  General: Negative for anorexia, weight loss, fever, chills, fatigue, weakness. ENT: Negative for hoarseness, difficulty swallowing , nasal congestion. CV: Negative for chest pain, angina, palpitations, dyspnea on exertion, peripheral edema.  Respiratory: Negative for dyspnea at rest, dyspnea on exertion, cough, sputum, wheezing.  GI: See history of present illness. GU:  Negative for dysuria, hematuria, urinary incontinence, urinary frequency, nocturnal urination.  Endo: Negative for unusual weight change.    Physical Examination:   There were no vitals taken for this visit.  General: Well-nourished, well-developed in  no acute distress.  Eyes: No icterus. Conjunctivae pink. Mouth: Oropharyngeal mucosa moist and pink , no lesions erythema or exudate. Lungs: Clear to auscultation bilaterally. Non-labored. Heart: Regular rate and rhythm, no murmurs rubs or gallops.  Abdomen: Bowel sounds are normal, nontender, nondistended, no hepatosplenomegaly or masses, no abdominal bruits or hernia , no rebound or guarding.   Extremities: No lower extremity edema. No clubbing or deformities. Neuro: Alert and oriented x 3.  Grossly intact. Skin: Warm and dry, no jaundice.   Psych: Alert and cooperative, normal mood and affect.   Imaging Studies: No results found.  Assessment and Plan:   Kathleen Carr is a 51 y.o. y/o female here to see me for GERD ongoing for many years.  History of smoking recent onset dysphagia for solids.  Also requires a colonoscopy for colon cancer screening average risk.  Some history of constipation.   Plan 1.  GERD patient information provided discussed about increasing the dose of Protonix to 40 mg twice daily till her symptoms come under better control.  At subsequent visit we will reduce the dose of the medication.  Advised to lose weight use a wedge pillow provided picture of the same.  Advised to have small meals more often rather than large meals at 1 time. 3.  Suggested a high-fiber diet for constipation and if it fails to add MiraLAX 1 capful daily.     Dr [name]  MD,MRCP [facility]) Follow up in ***

## 2022-05-11 ENCOUNTER — Other Ambulatory Visit: Payer: Self-pay

## 2022-05-11 ENCOUNTER — Other Ambulatory Visit: Payer: Self-pay | Admitting: Internal Medicine

## 2022-05-11 ENCOUNTER — Other Ambulatory Visit: Payer: Self-pay | Admitting: Family

## 2022-05-11 DIAGNOSIS — R062 Wheezing: Secondary | ICD-10-CM

## 2022-05-12 ENCOUNTER — Other Ambulatory Visit: Payer: Self-pay

## 2022-05-12 MED ORDER — CYCLOBENZAPRINE HCL 10 MG PO TABS
10.0000 mg | ORAL_TABLET | Freq: Three times a day (TID) | ORAL | 0 refills | Status: DC | PRN
  Filled 2022-05-12: qty 30, 10d supply, fill #0

## 2022-05-13 ENCOUNTER — Other Ambulatory Visit: Payer: Self-pay | Admitting: Family

## 2022-05-13 ENCOUNTER — Other Ambulatory Visit: Payer: Self-pay

## 2022-05-13 DIAGNOSIS — F319 Bipolar disorder, unspecified: Secondary | ICD-10-CM

## 2022-05-15 ENCOUNTER — Ambulatory Visit: Payer: 59 | Admitting: Gastroenterology

## 2022-05-15 ENCOUNTER — Other Ambulatory Visit: Payer: Self-pay

## 2022-05-15 ENCOUNTER — Other Ambulatory Visit: Payer: Self-pay | Admitting: Family

## 2022-05-15 ENCOUNTER — Other Ambulatory Visit: Payer: Self-pay | Admitting: Internal Medicine

## 2022-05-15 MED ORDER — METFORMIN HCL 1000 MG PO TABS
1000.0000 mg | ORAL_TABLET | Freq: Every day | ORAL | 1 refills | Status: DC
  Filled 2022-05-15: qty 90, 90d supply, fill #0

## 2022-05-15 MED FILL — Oxybutynin Chloride Tab ER 24HR 10 MG: ORAL | 90 days supply | Qty: 90 | Fill #1 | Status: AC

## 2022-05-15 MED FILL — Rosuvastatin Calcium Tab 5 MG: ORAL | 90 days supply | Qty: 90 | Fill #1 | Status: AC

## 2022-05-15 NOTE — Progress Notes (Deleted)
[name] MD, MRCP(U.K) [address]  [address]  [place], [place] [address]  Main: [phone]  Fax: [phone]   Primary Care Physician: Eugenia Pancoast, [place]  Primary Gastroenterologist:  Dr. [name]   No chief complaint on file.   HPI: Riddhi Grether Mehlman is a 51 y.o. female   Summary of history :  Initially referred and seen on 01/29/2022 for GERD and colon cancer screening.  Has had issues with acid reflux for many years. Symptoms are usually when she lays flat at night.  Can wake up in the middle of the night with symptoms of heartburn.  She has been on Protonix for many years which used to work well but recently has not been working.  Takes it 30 minutes before breakfast.  Has a few episodes of symptoms every week.  Also been having some difficulty swallowing meat at times.  No family history of esophageal cancer.  She is a smoker.  Has gas and bloating abdominal distention at times.  At times she does suffer from constipation has not tried any over-the-counter agents.  Never had a colonoscopy no change in bowel habits no rectal bleeding no family history of colon cancer or polyps.   01/13/2022 CT abdomen pelvis without contrast showed no acute findings. 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    ***   Current Outpatient Medications  Medication Sig Dispense Refill   albuterol (PROAIR HFA) 108 (90 Base) MCG/ACT inhaler Inhale 1-2 puffs into the lungs every 6  (six) hours as needed for wheezing. 8.5 g 2   ALPRAZolam (XANAX) 0.25 MG tablet Take 1-2 tablets (0.25-0.5 mg total) by mouth 2 (two) times daily as needed for anxiety (will cause drowsiness.). 30 tablet 2   amoxicillin-clavulanate (AUGMENTIN) 875-125 MG tablet Take 1 tablet by mouth in the morning and at bedtime.     aspirin EC 81 MG tablet Take 81 mg by mouth daily. Swallow whole.     cholecalciferol (VITAMIN D3) 25 MCG (1000 UNIT) tablet Take 1,000 Units by mouth daily.     cyclobenzaprine (FLEXERIL) 10 MG tablet Take 1 tablet (10 mg total) by mouth 3 (three) times daily as needed for muscle spasms (will cause drowsiness.). 30 tablet 0   docusate sodium (COLACE) 100 MG capsule Take 1 capsule by mouth daily.     levothyroxine (SYNTHROID) 50 MCG tablet TAKE 1 TABLET BY MOUTH DAILY BEFORE BREAKFAST. 90 tablet 1   loratadine (CLARITIN) 10 MG tablet Take 10 mg by mouth daily.     metFORMIN (GLUCOPHAGE) 1000 MG tablet Take 1 tablet by mouth daily.     oxybutynin (DITROPAN-XL) 10 MG 24 hr tablet Take 1 tablet (10 mg total) by mouth at bedtime. 90 tablet 1   pantoprazole (PROTONIX) 40 MG tablet Take 1 tablet (40 mg total) by mouth daily. 90 tablet 1   QUEtiapine (SEROQUEL) 300 MG tablet TAKE 1 TABLET (300 MG TOTAL) BY MOUTH AT BEDTIME. 90 tablet 1  rosuvastatin (CRESTOR) 5 MG tablet TAKE 1 TABLET BY MOUTH DAILY. 90 tablet 1   sertraline (ZOLOFT) 100 MG tablet TAKE 1 TABLET BY MOUTH DAILY 90 tablet 0   zolpidem (AMBIEN) 5 MG tablet TAKE 1 TABLET (5 MG TOTAL) BY MOUTH AT BEDTIME AS NEEDED FOR SLEEP. 30 tablet 0   No current facility-administered medications for this visit.    Allergies as of 05/15/2022 - Review Complete 04/25/2022  Allergen Reaction Noted   Dust mite mixed allergen ext [mite (d. farinae)]  08/01/2020   Other Hives 08/01/2020   Sulfa antibiotics Hives 06/24/2020   Misc. sulfonamide containing compounds Hives 05/05/2022    ROS:  General: Negative for anorexia, weight loss,  fever, chills, fatigue, weakness. ENT: Negative for hoarseness, difficulty swallowing , nasal congestion. CV: Negative for chest pain, angina, palpitations, dyspnea on exertion, peripheral edema.  Respiratory: Negative for dyspnea at rest, dyspnea on exertion, cough, sputum, wheezing.  GI: See history of present illness. GU:  Negative for dysuria, hematuria, urinary incontinence, urinary frequency, nocturnal urination.  Endo: Negative for unusual weight change.    Physical Examination:   There were no vitals taken for this visit.  General: Well-nourished, well-developed in no acute distress.  Eyes: No icterus. Conjunctivae pink. Mouth: Oropharyngeal mucosa moist and pink , no lesions erythema or exudate. Lungs: Clear to auscultation bilaterally. Non-labored. Heart: Regular rate and rhythm, no murmurs rubs or gallops.  Abdomen: Bowel sounds are normal, nontender, nondistended, no hepatosplenomegaly or masses, no abdominal bruits or hernia , no rebound or guarding.   Extremities: No lower extremity edema. No clubbing or deformities. Neuro: Alert and oriented x 3.  Grossly intact. Skin: Warm and dry, no jaundice.   Psych: Alert and cooperative, normal mood and affect.   Imaging Studies: No results found.  Assessment and Plan:   Arneisha D Hannig is a 51 y.o. y/o female  here to see me for GERD ongoing for many years.  History of smoking recent onset dysphagia for solids.  Also requires a colonoscopy for colon cancer screening average risk.  Some history of constipation.   Plan 1.  EGD and colonoscopy to be scheduled next week 2.  GERD patient information provided discussed about increasing the dose of Protonix to 40 mg twice daily till her symptoms come under better control.  At subsequent visit we will reduce the dose of the medication.  Advised to lose weight use a wedge pillow provided picture of the same.  Advised to have small meals more often rather than large meals at 1 time. 3.   Suggested a high-fiber diet for constipation and if it fails to add MiraLAX 1 capful daily.   Dr [name]  MD,MRCP [facility]) Follow up in ***

## 2022-05-16 ENCOUNTER — Other Ambulatory Visit: Payer: Self-pay

## 2022-05-16 ENCOUNTER — Other Ambulatory Visit: Payer: Self-pay | Admitting: Family

## 2022-05-16 DIAGNOSIS — F319 Bipolar disorder, unspecified: Secondary | ICD-10-CM

## 2022-05-20 ENCOUNTER — Other Ambulatory Visit: Payer: Self-pay | Admitting: Orthopedic Surgery

## 2022-05-20 ENCOUNTER — Other Ambulatory Visit: Payer: Self-pay

## 2022-05-20 ENCOUNTER — Telehealth: Payer: Self-pay | Admitting: Orthopedic Surgery

## 2022-05-20 ENCOUNTER — Other Ambulatory Visit: Payer: Self-pay | Admitting: Internal Medicine

## 2022-05-20 DIAGNOSIS — G4700 Insomnia, unspecified: Secondary | ICD-10-CM

## 2022-05-20 DIAGNOSIS — F319 Bipolar disorder, unspecified: Secondary | ICD-10-CM

## 2022-05-20 NOTE — Telephone Encounter (Signed)
Pt called requesting new script for tramadol for back pains. Pt states she no longer takes oxycodone since. Surgery. Please send to pharmacy on file. Pt phone number is [phone].

## 2022-05-21 ENCOUNTER — Encounter: Payer: Self-pay | Admitting: Family

## 2022-05-21 ENCOUNTER — Other Ambulatory Visit: Payer: Self-pay

## 2022-05-21 ENCOUNTER — Other Ambulatory Visit: Payer: Self-pay | Admitting: Sports Medicine

## 2022-05-21 ENCOUNTER — Other Ambulatory Visit: Payer: Self-pay | Admitting: Family

## 2022-05-21 DIAGNOSIS — G8929 Other chronic pain: Secondary | ICD-10-CM

## 2022-05-21 DIAGNOSIS — M25551 Pain in right hip: Secondary | ICD-10-CM

## 2022-05-21 DIAGNOSIS — F319 Bipolar disorder, unspecified: Secondary | ICD-10-CM

## 2022-05-21 DIAGNOSIS — G4700 Insomnia, unspecified: Secondary | ICD-10-CM

## 2022-05-21 DIAGNOSIS — S71111A Laceration without foreign body, right thigh, initial encounter: Secondary | ICD-10-CM | POA: Insufficient documentation

## 2022-05-21 DIAGNOSIS — M25851 Other specified joint disorders, right hip: Secondary | ICD-10-CM

## 2022-05-21 HISTORY — DX: Laceration without foreign body, right thigh, initial encounter: S71.111A

## 2022-05-21 MED ORDER — SERTRALINE HCL 100 MG PO TABS
ORAL_TABLET | Freq: Every day | ORAL | 0 refills | Status: DC
  Filled 2022-05-21: qty 90, 90d supply, fill #0

## 2022-05-21 MED ORDER — MUPIROCIN 2 % EX OINT
1.0000 | TOPICAL_OINTMENT | Freq: Two times a day (BID) | CUTANEOUS | 0 refills | Status: AC
  Filled 2022-05-21: qty 28, 14d supply, fill #0

## 2022-05-22 ENCOUNTER — Other Ambulatory Visit: Payer: Self-pay

## 2022-05-22 MED ORDER — ZOLPIDEM TARTRATE 5 MG PO TABS
ORAL_TABLET | ORAL | 0 refills | Status: DC
  Filled 2022-05-22: qty 30, 30d supply, fill #0

## 2022-05-22 MED ORDER — SERTRALINE HCL 100 MG PO TABS
ORAL_TABLET | Freq: Every day | ORAL | 0 refills | Status: DC
  Filled 2022-05-22: qty 30, 30d supply, fill #0

## 2022-05-22 MED ORDER — QUETIAPINE FUMARATE 300 MG PO TABS
ORAL_TABLET | ORAL | 0 refills | Status: DC
  Filled 2022-05-22: qty 30, 30d supply, fill #0

## 2022-05-22 MED ORDER — ALPRAZOLAM 0.25 MG PO TABS
0.2500 mg | ORAL_TABLET | Freq: Two times a day (BID) | ORAL | 0 refills | Status: DC | PRN
  Filled 2022-05-22: qty 30, 8d supply, fill #0

## 2022-06-02 ENCOUNTER — Ambulatory Visit
Admission: RE | Admit: 2022-06-02 | Discharge: 2022-06-02 | Disposition: A | Discharge status: Home / Self Care | Payer: 59 | Source: Ambulatory Visit | Attending: Sports Medicine | Admitting: Sports Medicine

## 2022-06-02 ENCOUNTER — Other Ambulatory Visit: Payer: Self-pay

## 2022-06-02 DIAGNOSIS — E119 Type 2 diabetes mellitus without complications: Secondary | ICD-10-CM | POA: Diagnosis not present

## 2022-06-02 DIAGNOSIS — M25551 Pain in right hip: Secondary | ICD-10-CM | POA: Diagnosis present

## 2022-06-02 DIAGNOSIS — M25851 Other specified joint disorders, right hip: Secondary | ICD-10-CM

## 2022-06-02 DIAGNOSIS — M25562 Pain in left knee: Secondary | ICD-10-CM | POA: Insufficient documentation

## 2022-06-02 DIAGNOSIS — I10 Essential (primary) hypertension: Secondary | ICD-10-CM | POA: Diagnosis not present

## 2022-06-02 DIAGNOSIS — M25561 Pain in right knee: Secondary | ICD-10-CM | POA: Insufficient documentation

## 2022-06-02 DIAGNOSIS — G8929 Other chronic pain: Secondary | ICD-10-CM | POA: Diagnosis not present

## 2022-06-02 MED ORDER — SODIUM CHLORIDE (PF) 0.9 % IJ SOLN
10.0000 mL | INTRAMUSCULAR | Status: DC | PRN
  Administered 2022-06-02: 10 mL via INTRAVENOUS

## 2022-06-02 MED ORDER — GADOBUTROL 1 MMOL/ML IV SOLN
0.0500 mL | Freq: Once | INTRAVENOUS | Status: AC | PRN
  Administered 2022-06-02: 0.05 mL

## 2022-06-02 MED ORDER — IOHEXOL 180 MG/ML  SOLN
20.0000 mL | Freq: Once | INTRAMUSCULAR | Status: AC | PRN
  Administered 2022-06-02: 20 mL

## 2022-06-02 MED ORDER — LIDOCAINE HCL (PF) 1 % IJ SOLN
5.0000 mL | Freq: Once | INTRAMUSCULAR | Status: AC
  Administered 2022-06-02: 5 mL via INTRADERMAL

## 2022-06-17 NOTE — Progress Notes (Deleted)
Psychiatric Initial Adult Assessment   Patient Identification: Kathleen Carr MRN:  [id number] Date of Evaluation:  06/17/2022 Referral Source: *** Chief Complaint:  No chief complaint on file.  Visit Diagnosis: No diagnosis found.  History of Present Illness:   Kathleen Carr is a 51 y.o. year old female with a history of PTSD, bipolar, GERD, diabetes, who is referred for PTSD.     Daily routine: Diet:  Exercise: Support: Household:  Marital status: Number of children: Employment:  Education:   Last PCP / ongoing medical evaluation:    Associated Signs/Symptoms: Depression Symptoms:  {DEPRESSION SYMPTOMS:20000} (Hypo) Manic Symptoms:  {BHH MANIC SYMPTOMS:22872} Anxiety Symptoms:  {BHH ANXIETY SYMPTOMS:22873} Psychotic Symptoms:  {BHH PSYCHOTIC SYMPTOMS:22874} PTSD Symptoms: {BHH PTSD SYMPTOMS:22875}  Past Psychiatric History:  Outpatient:  Psychiatry admission:  Previous suicide attempt:  Past trials of medication:  History of violence:    Previous Psychotropic Medications: {YES/NO:21197}  Substance Abuse History in the last 12 months:  {yes no:314532}  Consequences of Substance Abuse: {BHH CONSEQUENCES OF SUBSTANCE ABUSE:22880}  Past Medical History:  Past Medical History:  Diagnosis Date   Allergy    Anxiety    Arthritis    Asthma    Bipolar depression ([place])    Carpal tunnel syndrome    Cat bite 01/14/2022   Depression    Diabetes mellitus without complication (HCC)    GERD (gastroesophageal reflux disease)    Hypertension    Hypothyroidism    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   PTSD (post-traumatic stress disorder)    PTSD (post-traumatic stress disorder)    Smoker    Thyroid disease     Past Surgical History:  Procedure Laterality Date   ABDOMINAL HYSTERECTOMY     complete   CARPAL TUNNEL RELEASE Right 03/12/2022   Procedure: RIGHT CARPAL TUNNEL RELEASE;  Surgeon: Sherilyn Cooter, MD;  Location: [place];  Service: Orthopedics;   Laterality: Right;   CARPAL TUNNEL RELEASE Left 04/16/2022   Procedure: LEFT CARPAL TUNNEL RELEASE;  Surgeon: Sherilyn Cooter, MD;  Location: [place];  Service: Orthopedics;  Laterality: Left;   CHOLECYSTECTOMY     COLONOSCOPY WITH PROPOFOL N/A 02/05/2022   Procedure: COLONOSCOPY WITH PROPOFOL;  Surgeon: [name], MD;  [facility] ENDOSCOPY;  Service: Gastroenterology;  Laterality: N/A;   ESOPHAGOGASTRODUODENOSCOPY N/A 02/05/2022   Procedure: ESOPHAGOGASTRODUODENOSCOPY (EGD);  Surgeon: [name], MD;  Location: [facility] ENDOSCOPY;  Service: Gastroenterology;  Laterality: N/A;   KNEE ARTHROSCOPY Bilateral    TONSILLECTOMY      Family Psychiatric History: ***  Family History:  Family History  Problem Relation Age of Onset   Hypertension Mother    Thyroid disease Mother    Lung cancer Father 70       carcinoid, right lung   Ulcerative colitis Father    Glaucoma Maternal Grandmother    Renal Disease Maternal Grandfather    Diabetes Maternal Grandfather        Type2   Stroke Paternal Grandfather    Hypertension Paternal Grandfather     Social History:   Social History   Socioeconomic History   Marital status: Married    Spouse name: Not on file   Number of children: Not on file   Years of education: Not on file   Highest education level: Not on file  Occupational History   Not on file  Tobacco Use   Smoking status: Some Days    Packs/day: 0.50    Years: 42.00    Total pack  years: 21.00    Types: Cigarettes    Last attempt to quit: 05/25/2020    Years since quitting: 2.0   Smokeless tobacco: Former   Tobacco comments:    0.5 PPD 01/28/2022     Smoked since she was 51 years old  Vaping Use   Vaping Use: Former  Substance and Sexual Activity   Alcohol use: Not Currently   Drug use: Not Currently   Sexual activity: Yes    Partners: Female    Comment: married to wife  Other Topics Concern   Not on file  Social History Narrative   Not on file    Social Determinants of Health   Financial Resource Strain: Not on file  Food Insecurity: Not on file  Transportation Needs: Not on file  Physical Activity: Not on file  Stress: Not on file  Social Connections: Not on file    Additional Social History: ***  Allergies:   Allergies  Allergen Reactions   Dust Mite Mixed Allergen Ext [Mite (D. Farinae)]     Respiratory distresss   Other Hives    Allergy to [place], walnut and Birch trees and all grasses and allergic to Rabbits- patient reports anaphylactic    Sulfa Antibiotics Hives   Misc. Sulfonamide Containing Compounds Hives    Metabolic Disorder Labs: Lab Results  Component Value Date   HGBA1C 6.7 (A) 04/02/2022   MPG 117 07/19/2021   No results found for: "PROLACTIN" Lab Results  Component Value Date   CHOL 187 08/16/2021   TRIG 214.0 (H) 08/16/2021   HDL 56.10 08/16/2021   CHOLHDL 3 08/16/2021   VLDL 42.8 (H) 08/16/2021   LDLCALC 106 (H) 08/02/2020   Lab Results  Component Value Date   TSH 3.04 01/02/2022    Therapeutic Level Labs: No results found for: "LITHIUM" No results found for: "CBMZ" No results found for: "VALPROATE"  Current Medications: Current Outpatient Medications  Medication Sig Dispense Refill   albuterol (PROAIR HFA) 108 (90 Base) MCG/ACT inhaler Inhale 1-2 puffs into the lungs every 6 (six) hours as needed for wheezing. 8.5 g 2   ALPRAZolam (XANAX) 0.25 MG tablet Take 1-2 tablets (0.25-0.5 mg total) by mouth 2 (two) times daily as needed for anxiety (will cause drowsiness.). 30 tablet 0   amoxicillin-clavulanate (AUGMENTIN) 875-125 MG tablet Take 1 tablet by mouth in the morning and at bedtime.     aspirin EC 81 MG tablet Take 81 mg by mouth daily. Swallow whole.     cholecalciferol (VITAMIN D3) 25 MCG (1000 UNIT) tablet Take 1,000 Units by mouth daily.     cyclobenzaprine (FLEXERIL) 10 MG tablet Take 1 tablet (10 mg total) by mouth 3 (three) times daily as needed for muscle spasms (will  cause drowsiness.). 30 tablet 0   docusate sodium (COLACE) 100 MG capsule Take 1 capsule by mouth daily.     levothyroxine (SYNTHROID) 50 MCG tablet TAKE 1 TABLET BY MOUTH DAILY BEFORE BREAKFAST. 90 tablet 1   loratadine (CLARITIN) 10 MG tablet Take 10 mg by mouth daily.     metFORMIN (GLUCOPHAGE) 1000 MG tablet Take 1 tablet (1,000 mg total) by mouth daily. 90 tablet 1   oxybutynin (DITROPAN-XL) 10 MG 24 hr tablet Take 1 tablet (10 mg total) by mouth at bedtime. 90 tablet 1   pantoprazole (PROTONIX) 40 MG tablet Take 1 tablet (40 mg total) by mouth daily. 90 tablet 1   QUEtiapine (SEROQUEL) 300 MG tablet TAKE 1 TABLET (300 MG TOTAL) BY MOUTH  AT BEDTIME. 30 tablet 0   rosuvastatin (CRESTOR) 5 MG tablet TAKE 1 TABLET BY MOUTH DAILY. 90 tablet 1   sertraline (ZOLOFT) 100 MG tablet TAKE 1 TABLET BY MOUTH DAILY 30 tablet 0   zolpidem (AMBIEN) 5 MG tablet TAKE 1 TABLET (5 MG TOTAL) BY MOUTH AT BEDTIME AS NEEDED FOR SLEEP. 30 tablet 0   No current facility-administered medications for this visit.    Musculoskeletal: Strength & Muscle Tone: within normal limits Gait & Station: normal Patient leans: N/A  Psychiatric Specialty Exam: Review of Systems  There were no vitals taken for this visit.There is no height or weight on file to calculate BMI.  General Appearance: {Appearance:22683}  Eye Contact:  {BHH EYE CONTACT:22684}  Speech:  Clear and Coherent  Volume:  Normal  Mood:  {BHH MOOD:22306}  Affect:  {Affect (PAA):22687}  Thought Process:  Coherent  Orientation:  Full (Time, Place, and Person)  Thought Content:  Logical  Suicidal Thoughts:  {ST/HT (PAA):22692}  Homicidal Thoughts:  {ST/HT (PAA):22692}  Memory:  Immediate;   Good  Judgement:  {Judgement (PAA):22694}  Insight:  {Insight (PAA):22695}  Psychomotor Activity:  Normal  Concentration:  Concentration: Good and Attention Span: Good  Recall:  Good  Fund of Knowledge:Good  Language: Good  Akathisia:  No  Handed:  Right   AIMS (if indicated):  not done  Assets:  Communication Skills Desire for Improvement  ADL's:  Intact  Cognition: WNL  Sleep:  {BHH GOOD/FAIR/POOR:22877}   Screenings: Camera operator Row Office Visit from 12/25/2021 in [facility] Office Visit from 11/19/2021 in [facility] Office Visit from 09/09/2021 in [facility] Office Visit from 08/20/2021 in [facility] Video Visit from 04/04/2021 in [facility]  PHQ-2 Total Score 0 '2 1 1 '$ 0  PHQ-9 Total Score -- 15 -- -- --      Flowsheet Row Admission (Discharged) from 04/16/2022 in [facility] Admission (Discharged) from 03/12/2022 in [place] Admission (Discharged) from 02/05/2022 in [facility] No Risk No Risk Error: Question 6 not populated       Assessment and Plan:   Plan   The patient demonstrates the following risk factors for suicide: Chronic risk factors for suicide include: {Chronic Risk Factors for WYOVZCH:88502774}. Acute risk factors for suicide include: {Acute Risk Factors for JOINOMV:67209470}. Protective factors for this patient include: {Protective Factors for Suicide JGGE:36629476}. Considering these factors, the overall suicide risk at this point appears to be {Desc; low/moderate/high:110033}. Patient {ACTION; IS/IS LYY:50354656} appropriate for outpatient follow up.      Collaboration of Care: {BH OP Collaboration of Care:21014065}  Patient/Guardian was advised Release of Information must be obtained prior to any record release in order to collaborate their care with an outside provider. Patient/Guardian was advised if they have not already done so to contact the registration department to sign all necessary forms in order for [place] to release information regarding their care.   Consent: Patient/Guardian gives verbal consent for treatment and assignment of benefits for  services provided during this visit. Patient/Guardian expressed understanding and agreed to proceed.   Norman Clay, MD 8/22/20233:52 PM

## 2022-06-19 ENCOUNTER — Other Ambulatory Visit: Payer: Self-pay | Admitting: Family

## 2022-06-19 ENCOUNTER — Ambulatory Visit: Payer: 59 | Admitting: Orthopedic Surgery

## 2022-06-19 ENCOUNTER — Encounter: Payer: Self-pay | Admitting: Psychiatry

## 2022-06-19 ENCOUNTER — Encounter: Payer: BC Managed Care – PPO | Admitting: Psychiatry

## 2022-06-19 DIAGNOSIS — F319 Bipolar disorder, unspecified: Secondary | ICD-10-CM

## 2022-06-19 DIAGNOSIS — G4700 Insomnia, unspecified: Secondary | ICD-10-CM

## 2022-06-20 ENCOUNTER — Encounter: Payer: Self-pay | Admitting: Orthopedic Surgery

## 2022-06-20 ENCOUNTER — Other Ambulatory Visit: Payer: Self-pay

## 2022-06-20 ENCOUNTER — Other Ambulatory Visit: Payer: Self-pay | Admitting: Family

## 2022-06-20 ENCOUNTER — Ambulatory Visit (INDEPENDENT_AMBULATORY_CARE_PROVIDER_SITE_OTHER): Payer: 59 | Admitting: Orthopedic Surgery

## 2022-06-20 DIAGNOSIS — G5602 Carpal tunnel syndrome, left upper limb: Secondary | ICD-10-CM

## 2022-06-20 DIAGNOSIS — G4700 Insomnia, unspecified: Secondary | ICD-10-CM

## 2022-06-20 DIAGNOSIS — M65332 Trigger finger, left middle finger: Secondary | ICD-10-CM

## 2022-06-20 DIAGNOSIS — F319 Bipolar disorder, unspecified: Secondary | ICD-10-CM

## 2022-06-20 NOTE — Progress Notes (Signed)
This encounter was created in error - please disregard.

## 2022-06-20 NOTE — Progress Notes (Signed)
Post-Op Visit Note   Patient: Kathleen Carr           Date of Birth: [birth year]           MRN: [id number] Visit Date: 06/20/2022 PCP: Eugenia Pancoast, FNP   Assessment & Plan:  Chief Complaint:  Chief Complaint  Patient presents with   Left Hand - Follow-up   Visit Diagnoses:  1. Carpal tunnel syndrome, left upper limb   2. Trigger finger, left middle finger     Plan: Patient is now approximately 9 weeks out from her left carpal tunnel release.  She is doing very well.  She has no numbness, paresthesias, or nocturnal symptoms.  She does have some mild sensitivity of the scar but this is improving.  She has full thenar motor function.  She does have a new onset of trigger finger the left middle finger.  We reviewed the nature of trigger finger as well as its diagnosis, prognosis, and both conservative and surgical treatment options.  She like to try corticosteroid injection today.  I can see her back again as needed.  Follow-Up Instructions: No follow-ups on file.   Orders:  No orders of the defined types were placed in this encounter.  No orders of the defined types were placed in this encounter.   Imaging: No results found.  PMFS History: Patient Active Problem List   Diagnosis Date Noted   Trigger finger, left middle finger 06/20/2022   Laceration of right thigh 05/21/2022   Carpal tunnel syndrome, left upper limb    Acute pharyngitis 04/02/2022   Smoker 04/02/2022   Tobacco abuse 04/02/2022   Long-term use of high-risk medication 04/02/2022   Contact with and (suspected) exposure to covid-19 04/02/2022   Situational anxiety 11/21/2021   Vitamin D deficiency 11/21/2021   Hypothyroidism 11/21/2021   Carpal tunnel syndrome, right upper limb 11/02/2021   Bipolar disorder, currently in remission ([place]) 07/21/2021   Controlled diabetes mellitus type 2 with complications ([place]) 40/07/2724   Gastroesophageal reflux disease 03/06/2021   Leukocytosis 01/21/2021   Insomnia  01/21/2021   Chronic midline low back pain 08/05/2020   Overweight (BMI 25.0-29.9) 08/05/2020   Past Medical History:  Diagnosis Date   Allergy    Anxiety    Arthritis    Asthma    Bipolar depression ([place])    Carpal tunnel syndrome    Cat bite 01/14/2022   Depression    Diabetes mellitus without complication (HCC)    GERD (gastroesophageal reflux disease)    Hypertension    Hypothyroidism    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   PTSD (post-traumatic stress disorder)    PTSD (post-traumatic stress disorder)    Smoker    Thyroid disease     Family History  Problem Relation Age of Onset   Hypertension Mother    Thyroid disease Mother    Lung cancer Father 71       carcinoid, right lung   Ulcerative colitis Father    Renal Disease Maternal Grandfather    Diabetes Maternal Grandfather        Type2   Glaucoma Maternal Grandmother    Stroke Paternal Grandfather    Hypertension Paternal Grandfather     Past Surgical History:  Procedure Laterality Date   ABDOMINAL HYSTERECTOMY     complete   CARPAL TUNNEL RELEASE Right 03/12/2022   Procedure: RIGHT CARPAL TUNNEL RELEASE;  Surgeon: Sherilyn Cooter, MD;  Location: [facility];  Service: Orthopedics;  Laterality: Right;  CARPAL TUNNEL RELEASE Left 04/16/2022   Procedure: LEFT CARPAL TUNNEL RELEASE;  Surgeon: Sherilyn Cooter, MD;  Location: [place];  Service: Orthopedics;  Laterality: Left;   CHOLECYSTECTOMY     COLONOSCOPY WITH PROPOFOL N/A 02/05/2022   Procedure: COLONOSCOPY WITH PROPOFOL;  Surgeon: [name], MD;  Location: [facility] ENDOSCOPY;  Service: Gastroenterology;  Laterality: N/A;   ESOPHAGOGASTRODUODENOSCOPY N/A 02/05/2022   Procedure: ESOPHAGOGASTRODUODENOSCOPY (EGD);  Surgeon: [name], MD;  Location: [facility] ENDOSCOPY;  Service: Gastroenterology;  Laterality: N/A;   KNEE ARTHROSCOPY Bilateral    TONSILLECTOMY     Social History   Occupational History   Not on file  Tobacco Use    Smoking status: Some Days    Packs/day: 0.50    Years: 42.00    Total pack years: 21.00    Types: Cigarettes    Last attempt to quit: 05/25/2020    Years since quitting: 2.0   Smokeless tobacco: Former   Tobacco comments:    0.5 PPD 01/28/2022     Smoked since she was 51 years old  Vaping Use   Vaping Use: Former  Substance and Sexual Activity   Alcohol use: Not Currently   Drug use: Not Currently   Sexual activity: Yes    Partners: Female    Comment: married to wife

## 2022-06-22 ENCOUNTER — Other Ambulatory Visit: Payer: Self-pay

## 2022-06-23 ENCOUNTER — Other Ambulatory Visit: Payer: Self-pay

## 2022-06-24 ENCOUNTER — Other Ambulatory Visit: Payer: Self-pay

## 2022-06-27 ENCOUNTER — Ambulatory Visit (INDEPENDENT_AMBULATORY_CARE_PROVIDER_SITE_OTHER): Payer: 59 | Admitting: Internal Medicine

## 2022-06-27 ENCOUNTER — Encounter: Payer: Self-pay | Admitting: Internal Medicine

## 2022-06-27 ENCOUNTER — Other Ambulatory Visit: Payer: Self-pay

## 2022-06-27 VITALS — BP 124/78 | HR 80 | Temp 98.2°F | Ht 64.0 in | Wt 173.8 lb

## 2022-06-27 DIAGNOSIS — R350 Frequency of micturition: Secondary | ICD-10-CM

## 2022-06-27 DIAGNOSIS — K219 Gastro-esophageal reflux disease without esophagitis: Secondary | ICD-10-CM

## 2022-06-27 DIAGNOSIS — G4700 Insomnia, unspecified: Secondary | ICD-10-CM | POA: Diagnosis not present

## 2022-06-27 DIAGNOSIS — Z1231 Encounter for screening mammogram for malignant neoplasm of breast: Secondary | ICD-10-CM

## 2022-06-27 DIAGNOSIS — F319 Bipolar disorder, unspecified: Secondary | ICD-10-CM

## 2022-06-27 DIAGNOSIS — G8929 Other chronic pain: Secondary | ICD-10-CM

## 2022-06-27 DIAGNOSIS — M545 Low back pain, unspecified: Secondary | ICD-10-CM

## 2022-06-27 DIAGNOSIS — E118 Type 2 diabetes mellitus with unspecified complications: Secondary | ICD-10-CM

## 2022-06-27 DIAGNOSIS — E039 Hypothyroidism, unspecified: Secondary | ICD-10-CM

## 2022-06-27 DIAGNOSIS — Z90710 Acquired absence of both cervix and uterus: Secondary | ICD-10-CM | POA: Insufficient documentation

## 2022-06-27 DIAGNOSIS — F3170 Bipolar disorder, currently in remission, most recent episode unspecified: Secondary | ICD-10-CM

## 2022-06-27 LAB — MICROALBUMIN / CREATININE URINE RATIO
Creatinine,U: 38.5 mg/dL
Microalb Creat Ratio: 8.6 mg/g (ref 0.0–30.0)
Microalb, Ur: 3.3 mg/dL — ABNORMAL HIGH (ref 0.0–1.9)

## 2022-06-27 LAB — COMPREHENSIVE METABOLIC PANEL
ALT: 24 U/L (ref 0–35)
AST: 19 U/L (ref 0–37)
Albumin: 4.9 g/dL (ref 3.5–5.2)
Alkaline Phosphatase: 87 U/L (ref 39–117)
BUN: 10 mg/dL (ref 6–23)
CO2: 32 mEq/L (ref 19–32)
Calcium: 10.5 mg/dL (ref 8.4–10.5)
Chloride: 98 mEq/L (ref 96–112)
Creatinine, Ser: 0.72 mg/dL (ref 0.40–1.20)
GFR: 97.26 mL/min (ref >60.00)
Glucose, Bld: 82 mg/dL (ref 70–99)
Potassium: 4.6 mEq/L (ref 3.5–5.1)
Sodium: 138 mEq/L (ref 135–145)
Total Bilirubin: 0.3 mg/dL (ref 0.2–1.2)
Total Protein: 7.7 g/dL (ref 6.0–8.3)

## 2022-06-27 LAB — LIPID PANEL
Cholesterol: 186 mg/dL (ref 0–200)
HDL: 64.1 mg/dL (ref >39.00)
NonHDL: 121.98
Total CHOL/HDL Ratio: 3
Triglycerides: 208 mg/dL — ABNORMAL HIGH (ref 0.0–149.0)
VLDL: 41.6 mg/dL — ABNORMAL HIGH (ref 0.0–40.0)

## 2022-06-27 LAB — LDL CHOLESTEROL, DIRECT: Direct LDL: 98 mg/dL

## 2022-06-27 MED ORDER — OXYBUTYNIN CHLORIDE ER 15 MG PO TB24
15.0000 mg | ORAL_TABLET | Freq: Every day | ORAL | 2 refills | Status: DC
  Filled 2022-06-27 – 2022-07-02 (×2): qty 30, 30d supply, fill #0
  Filled 2022-07-27: qty 30, 30d supply, fill #1
  Filled 2022-08-25: qty 30, 30d supply, fill #2

## 2022-06-27 MED ORDER — METFORMIN HCL 1000 MG PO TABS
1000.0000 mg | ORAL_TABLET | Freq: Two times a day (BID) | ORAL | 1 refills | Status: DC
  Filled 2022-06-27: qty 180, 90d supply, fill #0
  Filled 2022-07-08: qty 60, 30d supply, fill #0
  Filled 2022-08-06: qty 60, 30d supply, fill #1
  Filled 2022-09-05: qty 60, 30d supply, fill #2
  Filled 2022-10-05: qty 60, 30d supply, fill #3
  Filled 2022-11-07: qty 60, 30d supply, fill #4

## 2022-06-27 MED ORDER — QUETIAPINE FUMARATE 300 MG PO TABS
300.0000 mg | ORAL_TABLET | Freq: Every day | ORAL | 1 refills | Status: DC
  Filled 2022-06-27: qty 30, 30d supply, fill #0
  Filled 2022-07-24: qty 30, 30d supply, fill #1
  Filled 2022-08-25: qty 30, 30d supply, fill #2
  Filled 2022-09-29: qty 30, 30d supply, fill #3
  Filled 2022-10-25: qty 30, 30d supply, fill #4

## 2022-06-27 MED ORDER — SERTRALINE HCL 100 MG PO TABS
ORAL_TABLET | Freq: Every day | ORAL | 1 refills | Status: DC
  Filled 2022-06-27: qty 90, fill #0
  Filled 2022-08-16: qty 30, 30d supply, fill #0
  Filled 2022-09-15: qty 30, 30d supply, fill #1
  Filled 2022-10-14: qty 30, 30d supply, fill #2
  Filled 2022-11-17: qty 30, 30d supply, fill #3

## 2022-06-27 MED ORDER — TRAMADOL HCL 50 MG PO TABS
50.0000 mg | ORAL_TABLET | Freq: Two times a day (BID) | ORAL | 1 refills | Status: DC | PRN
  Filled 2022-06-27: qty 60, 30d supply, fill #0
  Filled 2022-07-24: qty 14, 7d supply, fill #1
  Filled 2022-08-11: qty 16, 8d supply, fill #2

## 2022-06-27 MED ORDER — SIMVASTATIN 40 MG PO TABS
40.0000 mg | ORAL_TABLET | Freq: Every day | ORAL | 0 refills | Status: DC
  Filled 2022-06-27: qty 30, 30d supply, fill #0

## 2022-06-27 MED ORDER — OMEPRAZOLE 40 MG PO CPDR
40.0000 mg | DELAYED_RELEASE_CAPSULE | Freq: Every day | ORAL | 3 refills | Status: DC
  Filled 2022-06-27: qty 5, 5d supply, fill #0
  Filled 2022-06-27: qty 25, 25d supply, fill #0

## 2022-06-27 NOTE — Patient Instructions (Addendum)
Your annual mammogram has been ordered .    [facility] will not allow [place] to schedule it for you,  so please  call to make your appointment [phone]

## 2022-06-27 NOTE — Progress Notes (Unsigned)
Subjective:  Patient ID: Kathleen Carr, female    DOB: Jul 16, 1971  Age: 51 y.o. MRN: [id number]  CC: There were no encounter diagnoses.   HPI Kathleen Carr presents for  Chief Complaint  Patient presents with   Medication Refill   Last seen in October.  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  Still in pain.  Using ibuprofen and tylenol 1000 mg twice daily.   Was taking tramadol for back pain,  was stopped for hand surgery and refill was denied for unclear reasons.   She is still reporting pain despite use of ibuprogen and tylenol .  Pain is worse during the day and limits her activities.  Using ambien , seroquel and zoloft.   Bipolar disorder:  was referred to [place] at [facility]   was treated rudely.  Has been stable on seroquel and zoloft  no manic episodes in years.  Using ambien to sleep and prn alprazolam   Urine frequencyu;  using ditropaln xl 10 mg wants to increase 15 mg   Pulmonology evaluation following a pneumonia .  Diagnosed with RSV.  Quantiferon gold positive : [facility] .  PPD was normal .  Type 2 DM: has been taking increased dose of metformin and tolerating it.   A1c done June  7  6.7        Outpatient Medications Prior to Visit  Medication Sig Dispense Refill   ALPRAZolam (XANAX) 0.25 MG tablet Take 1-2 tablets (0.25-0.5 mg total) by mouth 2 (two) times daily as needed for anxiety (will cause drowsiness.). 30 tablet 0   aspirin EC 81 MG tablet Take 81 mg by mouth daily. Swallow whole.     cholecalciferol (VITAMIN D3) 25 MCG (1000 UNIT) tablet Take 1,000 Units by mouth daily.     cyclobenzaprine (FLEXERIL) 10 MG tablet Take 1 tablet (10 mg total) by mouth 3 (three) times daily as needed for muscle spasms (will cause drowsiness.). 30 tablet 0   docusate sodium (COLACE) 100  MG capsule Take 1 capsule by mouth daily.     levothyroxine (SYNTHROID) 50 MCG tablet TAKE 1 TABLET BY MOUTH DAILY BEFORE BREAKFAST. 90 tablet 1   loratadine (CLARITIN) 10 MG tablet Take 10 mg by mouth daily.     metFORMIN (GLUCOPHAGE) 1000 MG tablet Take 1 tablet (1,000 mg total) by mouth daily. 90 tablet 1   oxybutynin (DITROPAN-XL) 10 MG 24 hr tablet Take 1 tablet (10 mg total) by mouth at bedtime. 90 tablet 1   pantoprazole (PROTONIX) 40 MG tablet Take 1 tablet (40 mg total) by mouth daily. 90 tablet 1   QUEtiapine (SEROQUEL) 300 MG tablet TAKE 1 TABLET (300 MG TOTAL) BY MOUTH AT BEDTIME. 30 tablet 0   rosuvastatin (CRESTOR) 5 MG tablet TAKE 1 TABLET BY MOUTH DAILY. 90 tablet 1   sertraline (ZOLOFT) 100 MG tablet TAKE 1 TABLET BY MOUTH DAILY 30 tablet 0   zolpidem (AMBIEN) 5 MG tablet TAKE 1 TABLET (5 MG TOTAL) BY MOUTH AT BEDTIME AS NEEDED FOR SLEEP. 30 tablet 0   No facility-administered medications prior to visit.    Review of Systems;  Patient denies headache, fevers, malaise, unintentional weight loss, skin rash, eye pain, sinus congestion and sinus pain, sore throat, dysphagia,  hemoptysis , cough, dyspnea, wheezing,  chest pain, palpitations, orthopnea, edema, abdominal pain, nausea, melena, diarrhea, constipation, flank pain, dysuria, hematuria, urinary  Frequency, nocturia, numbness, tingling, seizures,  Focal weakness, Loss of consciousness,  Tremor, insomnia, depression, anxiety, and suicidal ideation.      Objective:  BP 124/78 (BP Location: Left Arm, Patient Position: Sitting, Cuff Size: Normal)   Pulse 80   Temp 98.2 F (36.8 C) (Oral)   Ht '5\' 4"'$  (1.626 m)   Wt 173 lb 12.8 oz (78.8 kg)   SpO2 96%   BMI 29.83 kg/m   BP Readings from Last 3 Encounters:  06/27/22 124/78  04/16/22 104/63  04/02/22 106/60    Wt Readings from Last 3 Encounters:  06/27/22 173 lb 12.8 oz (78.8 kg)  04/16/22 175 lb 7.8 oz (79.6 kg)  04/02/22 179 lb 4 oz (81.3 kg)    General  appearance: alert, cooperative and appears stated age Ears: normal TM's and external ear canals both ears Throat: lips, mucosa, and tongue normal; teeth and gums normal Neck: no adenopathy, no carotid bruit, supple, symmetrical, trachea midline and thyroid not enlarged, symmetric, no tenderness/mass/nodules Back: symmetric, no curvature. ROM normal. No CVA tenderness. Lungs: clear to auscultation bilaterally Heart: regular rate and rhythm, S1, S2 normal, no murmur, click, rub or gallop Abdomen: soft, non-tender; bowel sounds normal; no masses,  no organomegaly Pulses: 2+ and symmetric Skin: Skin color, texture, turgor normal. No rashes or lesions Lymph nodes: Cervical, supraclavicular, and axillary nodes normal.  Lab Results  Component Value Date   HGBA1C 6.7 (A) 04/02/2022   HGBA1C 7.2 (H) 01/02/2022   HGBA1C 6.6 (H) 11/27/2021    Lab Results  Component Value Date   CREATININE 0.87 04/15/2022   CREATININE 0.79 03/10/2022   CREATININE 0.93 01/10/2022    Lab Results  Component Value Date   WBC 10.4 01/10/2022   HGB 13.8 01/10/2022   HCT 39.8 01/10/2022   PLT 394.0 01/10/2022   GLUCOSE 104 (H) 04/15/2022   CHOL 187 08/16/2021   TRIG 214.0 (H) 08/16/2021   HDL 56.10 08/16/2021   LDLDIRECT 111.0 08/16/2021   LDLCALC 106 (H) 08/02/2020   ALT 20 01/10/2022   AST 16 01/10/2022   NA 139 04/15/2022   K 4.6 04/15/2022   CL 105 04/15/2022   CREATININE 0.87 04/15/2022   BUN 10 04/15/2022   CO2 25 04/15/2022   TSH 3.04 01/02/2022   HGBA1C 6.7 (A) 04/02/2022   MICROALBUR <0.7 08/16/2021    MR HIP RIGHT W CONTRAST  Result Date: 06/02/2022 CLINICAL DATA:  Right hip pain EXAM: MRI OF THE RIGHT HIP WITH CONTRAST (MR Arthrogram) TECHNIQUE: Multiplanar, multisequence MR imaging of the hip was performed immediately following contrast injection into the hip joint under fluoroscopic guidance. No intravenous contrast was administered. COMPARISON:  None Available. FINDINGS: Bones: No acute  fracture. No dislocation. No femoral head avascular necrosis. Mild osteoarthritis of the left hip with chondral thinning and subchondral cyst formation in the superior acetabulum. No left hip joint effusion. Bony pelvis intact without diastasis. SI joints and pubic symphysis within normal limits. Mild discogenic endplate marrow edema on the left at L4-5. Mild lower lumbar facet arthropathy. No extra-articular sites of bone marrow edema. No marrow replacing bone lesion. Articular cartilage and labrum Articular cartilage: Mild chondral surface irregularity along the superior acetabulum with mild chondral thinning of the anterosuperior femoral head. No subchondral marrow signal changes. Labrum:  Mild blunting of the superior labrum.  No paralabral cyst. Joint or bursal effusion Joint effusion:  Well distended  with injected contrast. Bursae: No abnormal bursal fluid collection. Muscles and tendons Muscles and tendons: The gluteal, hamstring, iliopsoas, rectus femoris, and adductor tendons appear intact without tear or significant tendinosis. Normal muscle bulk and signal intensity without edema, atrophy, or fatty infiltration. Other findings Miscellaneous: No soft tissue edema or fluid collection. No inguinal lymphadenopathy. IMPRESSION: 1. Mild osteoarthritis of the bilateral hips. 2. Mild lower lumbar spondylosis. Electronically Signed   By: Davina Poke D.O.   On: 06/02/2022 16:00   DG FLUORO GUIDED NEEDLE PLC ASPIRATION/INJECTION LOC  Result Date: 06/02/2022 CLINICAL DATA:  Right hip pain EXAM: RIGHT HIP INJECTION FOR MRI PROCEDURE: After a thorough discussion of risks and benefits of the procedure, written and oral informed consent was obtained. The consent discussion included off label use of Gadavist, the risk of bleeding, infection and injury to nerves and adjacent blood vessels. Extra-articular injection was also a possible risk discussed. Preliminary localization was performed over the right hip. The  area was marked over the junction of the left femoral head and neck. After prep and drape in the usual sterile fashion, the skin and deeper subcutaneous tissues were anesthetized with 1% Lidocaine without Epinephrine. Under fluoroscopic guidance, a 22 gauge 3.5 inch spinal needle was advanced into the joint at the lateral margin of the junction of the femoral head and neck. Intra-articular injection of Lidocaine was performed which flowed freely and subsequently the joint was distended with 12 ml of a 1:200 dilution of Gadavist contrast. The MR arthrogram solution was as follows: 15 ml of Omnipaque 180 contrast agent, 0.05 mL Gadavist, 5 mL saline. An end point was felt as well as the patient experiencing pressure and the injection was discontinued, the needle removed, and a sterile dressing applied. The patient was taken to MRI for subsequent imaging. The patient tolerated the procedure well and there were no complications. FLUOROSCOPY: Radiation Exposure Index (as provided by the fluoroscopic device): 22.40 mGy Kerma IMPRESSION: Successful right hip fluoroscopically guided injection. This exam was performed by Candiss Norse, PA-C, and was supervised and interpreted by Kathreen Devoid, MD. Electronically Signed   By: Kathreen Devoid M.D.   On: 06/02/2022 10:13    Assessment & Plan:   Problem List Items Addressed This Visit   None   I spent a total of   minutes with this patient in a face to face visit on the date of this encounter reviewing the last office visit with me on        ,  most recent with patient's cardiologist in    ,  patient'ss diet and eating habits, home blood pressure readings ,  most recent imaging study ,   and post visit ordering of testing and therapeutics.    Follow-up: No follow-ups on file.   Crecencio Mc, MD

## 2022-06-28 DIAGNOSIS — R350 Frequency of micturition: Secondary | ICD-10-CM | POA: Insufficient documentation

## 2022-06-28 NOTE — Assessment & Plan Note (Signed)
Chronic, inadequately managed with Ditropan XL 10 mg.  Will Increase dose to 15 mg

## 2022-06-28 NOTE — Assessment & Plan Note (Signed)
Currently well-controlled on metformin.  .  hemoglobin A1c is at goal of less than 7.0 . Patient is reminded to schedule an annual eye exam and foot exam is normal today. Patient has early microalbuminuria. Patient is tolerating statin therapy for CAD risk reduction and on ACE/ARB for renal protection and hypertension     Lab Results  Component Value Date   HGBA1C 6.7 (A) 04/02/2022   Lab Results  Component Value Date   MICROALBUR 3.3 (H) 06/27/2022   MICROALBUR <0.7 08/16/2021

## 2022-06-28 NOTE — Assessment & Plan Note (Signed)
Symptoms and mood are stable on seroquel and zoloft.  No changes today

## 2022-06-28 NOTE — Assessment & Plan Note (Signed)
Thyroid function is WNL on current dose.  No current changes needed.   Lab Results  Component Value Date   TSH 3.04 01/02/2022

## 2022-06-28 NOTE — Assessment & Plan Note (Signed)
Refilling tramadol for pain management after reviewing the [place]

## 2022-07-02 ENCOUNTER — Other Ambulatory Visit: Payer: Self-pay | Admitting: Internal Medicine

## 2022-07-02 ENCOUNTER — Other Ambulatory Visit: Payer: Self-pay

## 2022-07-02 ENCOUNTER — Other Ambulatory Visit: Payer: Self-pay | Admitting: Family

## 2022-07-02 DIAGNOSIS — G4700 Insomnia, unspecified: Secondary | ICD-10-CM

## 2022-07-02 DIAGNOSIS — R062 Wheezing: Secondary | ICD-10-CM

## 2022-07-02 DIAGNOSIS — K219 Gastro-esophageal reflux disease without esophagitis: Secondary | ICD-10-CM

## 2022-07-02 DIAGNOSIS — F319 Bipolar disorder, unspecified: Secondary | ICD-10-CM

## 2022-07-02 MED ORDER — CYCLOBENZAPRINE HCL 10 MG PO TABS
10.0000 mg | ORAL_TABLET | Freq: Three times a day (TID) | ORAL | 0 refills | Status: DC | PRN
  Filled 2022-07-02: qty 30, 10d supply, fill #0

## 2022-07-03 ENCOUNTER — Other Ambulatory Visit: Payer: Self-pay | Admitting: Family

## 2022-07-03 ENCOUNTER — Other Ambulatory Visit: Payer: Self-pay

## 2022-07-03 DIAGNOSIS — F319 Bipolar disorder, unspecified: Secondary | ICD-10-CM

## 2022-07-03 DIAGNOSIS — G4700 Insomnia, unspecified: Secondary | ICD-10-CM

## 2022-07-04 ENCOUNTER — Other Ambulatory Visit: Payer: Self-pay | Admitting: Internal Medicine

## 2022-07-04 ENCOUNTER — Other Ambulatory Visit: Payer: Self-pay

## 2022-07-04 DIAGNOSIS — F319 Bipolar disorder, unspecified: Secondary | ICD-10-CM

## 2022-07-04 DIAGNOSIS — G4700 Insomnia, unspecified: Secondary | ICD-10-CM

## 2022-07-04 NOTE — Telephone Encounter (Signed)
Ambien 5 mg LOV: 06/27/22 Last Refill:11/18/21 Upcoming appt: 12/29/22

## 2022-07-06 ENCOUNTER — Other Ambulatory Visit: Payer: Self-pay

## 2022-07-06 MED FILL — Zolpidem Tartrate Tab 5 MG: ORAL | 25 days supply | Qty: 15 | Fill #0 | Status: AC

## 2022-07-07 ENCOUNTER — Other Ambulatory Visit: Payer: Self-pay

## 2022-07-08 ENCOUNTER — Other Ambulatory Visit: Payer: Self-pay

## 2022-07-08 ENCOUNTER — Other Ambulatory Visit: Payer: Self-pay | Admitting: Internal Medicine

## 2022-07-08 DIAGNOSIS — F319 Bipolar disorder, unspecified: Secondary | ICD-10-CM

## 2022-07-08 DIAGNOSIS — G4700 Insomnia, unspecified: Secondary | ICD-10-CM

## 2022-07-08 MED FILL — Alprazolam Tab 0.25 MG: ORAL | 30 days supply | Qty: 30 | Fill #0 | Status: AC

## 2022-07-08 NOTE — Progress Notes (Deleted)
Referring Physician:  Crecencio Mc, MD Kathleen Carr,  [place] [id number]  Primary Physician:  Crecencio Mc, MD  History of Present Illness: 07/08/2022  History of Left L5-S1 lumbar laminectomy in 2021 with Dr. Oren Section.   History of bipolar, DM, GERD, HTN, and PTSD.   Last seen by [name] on 03/20/22 for LBP with right groin and anterior thigh pain.   MRI 02/21/22 showed multilevel degenerative changes the lumbar spine, worst at L4-L5 where there is asymmetric left disc bulging and facet arthropathy resulting in severe left-sided neural foraminal stenosis and exiting nerve root impingement. Mild spinal canal and moderate right neural foraminal stenosis at this level.   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 Discussed lumbar ESI and PT, she wanted to hold off on these.   She is here for follow up.           Duration: *** Location: *** Quality: *** Severity: ***  Precipitating: aggravated by *** Modifying factors: made better by *** Weakness: none Timing: *** Bowel/Bladder Dysfunction: none  Conservative measures:  Physical therapy: PT in 2022 for a couple of weeks with exacerbating symptoms. Multimodal medical therapy including regular antiinflammatories: Tylenol, ibuprofen, Mobic, tramadol, and Flexeril Injections: Previous facet and SI joint injections in 2022 at [facility]. No recent epidural steroid injections   Past Surgery: Left L5-S1 laminectomy in 2021 with Dr. Herbie Saxon has ***no symptoms of cervical myelopathy.  The symptoms are causing a significant impact on the patient's life.   Review of Systems:  A 10 point review of systems is negative, except for the pertinent positives and negatives detailed in the HPI.  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   PTSD (post-traumatic stress disorder)    PTSD (post-traumatic stress disorder)    Smoker    Thyroid disease     Past Surgical History: Past Surgical History:  Procedure Laterality Date   ABDOMINAL HYSTERECTOMY     complete   CARPAL TUNNEL RELEASE Right 03/12/2022   Procedure: RIGHT CARPAL TUNNEL RELEASE;  Surgeon: Sherilyn Cooter, MD;  Location: [facility];  Service: Orthopedics;  Laterality: Right;   CARPAL TUNNEL RELEASE Left 04/16/2022   Procedure: LEFT CARPAL TUNNEL RELEASE;  Surgeon: Sherilyn Cooter, MD;  Location: [facility];  Service: Orthopedics;  Laterality: Left;   CHOLECYSTECTOMY     COLONOSCOPY WITH PROPOFOL N/A 02/05/2022   Procedure: COLONOSCOPY WITH PROPOFOL;  Surgeon: [name], MD;  Location[facility] ENDOSCOPY;  Service: Gastroenterology;  Laterality: N/A;   ESOPHAGOGASTRODUODENOSCOPY N/A 02/05/2022   Procedure: ESOPHAGOGASTRODUODENOSCOPY (EGD);  Surgeon: [name], MD;  Location: [facility] ENDOSCOPY;  Service: Gastroenterology;  Laterality: N/A;   KNEE ARTHROSCOPY Bilateral    TONSILLECTOMY      Allergies: Allergies as of 07/10/2022 - Review Complete 06/27/2022  Allergen Reaction Noted   Dust mite mixed allergen ext [mite (d. farinae)]  08/01/2020   Other Hives 08/01/2020   Sulfa antibiotics Hives 06/24/2020   Misc. sulfonamide containing compounds Hives 05/05/2022    Medications:  Outpatient Encounter Medications as of 07/10/2022  Medication Sig   ALPRAZolam (XANAX) 0.25 MG tablet Take 1-2 tablets (0.25-0.5 mg total) by mouth 2 (two) times daily as needed for anxiety (will cause drowsiness.).   aspirin EC 81 MG tablet Take 81 mg by mouth daily. Swallow whole.   cholecalciferol (VITAMIN D3) 25 MCG (1000 UNIT)  tablet Take 1,000 Units by mouth daily.   cyclobenzaprine (FLEXERIL) 10 MG tablet Take 1 tablet (10 mg total) by mouth 3 (three) times daily as needed for muscle spasms (will cause drowsiness.).   docusate sodium (COLACE) 100 MG capsule Take 1 capsule by mouth daily.   levothyroxine (SYNTHROID) 50 MCG tablet TAKE 1 TABLET BY MOUTH DAILY BEFORE BREAKFAST.   loratadine (CLARITIN) 10 MG tablet Take 10 mg by mouth daily.   metFORMIN (GLUCOPHAGE) 1000 MG tablet Take 1 tablet (1,000 mg total) by mouth 2 (two) times daily with a meal.   omeprazole (PRILOSEC) 40 MG capsule Take 1 capsule (40 mg total) by mouth daily.   oxybutynin (DITROPAN XL) 15 MG 24 hr tablet Take 1 tablet (15 mg total) by mouth at bedtime.   QUEtiapine (SEROQUEL) 300 MG tablet Take 1 tablet (300 mg total) by mouth at bedtime.   sertraline (ZOLOFT) 100 MG tablet TAKE 1 TABLET BY MOUTH DAILY   simvastatin (ZOCOR) 40 MG tablet Take 1 tablet (40 mg total) by mouth daily at 6 PM.   traMADol (ULTRAM) 50 MG tablet Take 1 tablet (50 mg total) by mouth 2 (two) times daily as needed.   zolpidem (AMBIEN) 5 MG tablet TAKE 1 TABLET (5 MG TOTAL) BY MOUTH AT BEDTIME AS NEEDED FOR SLEEP.   [DISCONTINUED] sitaGLIPtin (JANUVIA) 100 MG tablet Take 1 tablet (100 mg total) by mouth daily.   No facility-administered encounter medications on file as of 07/10/2022.    Social History: Social History   Tobacco Use   Smoking status: Some Days    Packs/day: 0.50    Years: 42.00    Total pack years: 21.00    Types: Cigarettes    Last attempt to quit: 05/25/2020    Years since quitting: 2.1   Smokeless tobacco: Former   Tobacco comments:    0.5 PPD 01/28/2022     Smoked since she was 51 years old  Vaping Use   Vaping Use: Former  Substance Use Topics   Alcohol use: Not Currently   Drug use: Not Currently    Family Medical History: Family History  Problem Relation Age of Onset   Hypertension Mother    Thyroid disease Mother    Lung cancer  Father 70       carcinoid, right lung   Ulcerative colitis Father    Renal Disease Maternal Grandfather    Diabetes Maternal Grandfather        Type2   Glaucoma Maternal Grandmother    Stroke Paternal Grandfather    Hypertension Paternal Grandfather     Physical Examination: There were no vitals filed for this visit.  General: Patient is well developed, well nourished, calm, collected, and in no apparent distress. Attention to examination is appropriate.  Respiratory: Patient is breathing without any difficulty.   NEUROLOGICAL:     Awake, alert, oriented to person, place, and time.  Speech is clear and fluent. Fund of knowledge is appropriate.   Cranial Nerves: Pupils equal round and reactive to light.  Facial tone is symmetric.  Facial sensation is symmetric.  ROM of spine:  *** ROM of cervical spine ***  pain *** ROM of lumbar spine *** pain  No abnormal lesions on exposed skin.   Strength: Side Biceps Triceps Deltoid Interossei Grip Wrist Ext. Wrist Flex.  R '5 5 5 5 5 5 5  '$ L '5 5 5 5 5 5 5   '$ Side Iliopsoas Quads Hamstring PF DF EHL  R '5 5 5 5 5 5  '$ L '5 5 5 5 5 5   '$ Reflexes are ***2+ and symmetric at the biceps, triceps, brachioradialis, patella and achilles.   Hoffman's is absent.  Clonus is not present.   Bilateral upper and lower extremity sensation is intact to light touch.    No evidence of dysmetria noted.  Gait is normal.   ***No difficulty with tandem gait.    Medical Decision Making  Imaging: ***  I have personally reviewed the images and agree with the above interpretation.  Assessment and Plan: Ms. Schmall is a pleasant 51 y.o. female with ***  Above treatment options discussed with patient and following plan made:   - Order for physical therapy for *** spine ***. - Continue on current medications including ***. Reviewed proper dosing along with risks and benefits. Take and NSAIDs with food.      I spent a total of *** minutes in face-to-face  and non-face-to-face activities related to this patient's care today.  Thank you for involving me in the care of this patient.   Geronimo Boot PA-C Dept. of Neurosurgery

## 2022-07-10 ENCOUNTER — Ambulatory Visit: Payer: Medicaid Other | Admitting: Orthopedic Surgery

## 2022-07-10 ENCOUNTER — Other Ambulatory Visit: Payer: Self-pay

## 2022-07-16 NOTE — Progress Notes (Signed)
Referring Physician:  Loleta Dicker, [address] [place],  [place] [address]  Primary Physician:  Crecencio Mc, MD  History of Present Illness: 07/22/2022  History of Left L5-S1 lumbar laminectomy in 2021 with Dr. Oren Section.   History of bipolar, DM, GERD, HTN, and PTSD.   Last seen by [name] on 03/20/22 for LBP with right groin and anterior thigh pain.   MRI 02/21/22 showed multilevel degenerative changes the lumbar spine, worst at L4-L5 where there is asymmetric left disc bulging and facet arthropathy resulting in severe left-sided neural foraminal stenosis and exiting nerve root impingement. Mild spinal canal and moderate right neural foraminal stenosis at this level.   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 Discussed lumbar ESI and PT, she wanted to hold off on these.   She is here for follow up.   She saw ortho and is scheduled for right hip surgery in October (labral tear and bone spurs per patient).   She has constant right > left sided LBP with some pulling in front right thigh and left posterior thigh. Leg pain is intermittent. Pain is worse with prolonged standing and walking. She has pain with carrying laundry basket. Some relief relief with changing position. No numbness or tingling. No weakness noted. No bowel or bladder issues.   She is on flexeril and ultram. She has minimal improvement with these. No recent lumbar ESIs (had prior to surgery in 2021). No recent PT for her lower back.   Conservative measures:  Physical therapy: PT in 2022 for a couple of weeks with exacerbating symptoms. Multimodal medical therapy including regular antiinflammatories: Tylenol, ibuprofen, Mobic, tramadol, and Flexeril Injections: Previous facet and SI joint injections in 2022 at [facility]. No recent epidural steroid injections   Past Surgery: Left L5-S1 laminectomy in 2021 with Dr. Oren Section  The  symptoms are causing a significant impact on the patient's life.   Review of Systems:  A 10 point review of systems is negative, except for the pertinent positives and negatives detailed in the HPI.  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   PTSD (post-traumatic stress disorder)    PTSD (post-traumatic stress disorder)    Smoker    Thyroid disease     Past Surgical History: Past Surgical History:  Procedure Laterality Date   ABDOMINAL HYSTERECTOMY     complete   CARPAL TUNNEL RELEASE Right 03/12/2022   Procedure: RIGHT CARPAL TUNNEL RELEASE;  Surgeon: Sherilyn Cooter, MD;  Location: [place];  Service: Orthopedics;  Laterality: Right;   CARPAL TUNNEL RELEASE Left 04/16/2022   Procedure: LEFT CARPAL TUNNEL RELEASE;  Surgeon: Sherilyn Cooter, MD;  Location: [facility];  Service: Orthopedics;  Laterality: Left;   CHOLECYSTECTOMY     COLONOSCOPY WITH PROPOFOL N/A 02/05/2022   Procedure: COLONOSCOPY WITH PROPOFOL;  Surgeon: [name], MD;  Location: [facility] ENDOSCOPY;  Service: Gastroenterology;  Laterality: N/A;   ESOPHAGOGASTRODUODENOSCOPY N/A 02/05/2022   Procedure: ESOPHAGOGASTRODUODENOSCOPY (EGD);  Surgeon: [name], MD;  Location: [facility] ENDOSCOPY;  Service: Gastroenterology;  Laterality: N/A;   KNEE ARTHROSCOPY Bilateral    TONSILLECTOMY      Allergies: Allergies as of 07/22/2022 - Review Complete 07/22/2022  Allergen Reaction Noted   Dust mite mixed allergen ext [mite (d. farinae)]  08/01/2020   Other Hives 08/01/2020   Sulfa antibiotics Hives 06/24/2020   Misc. sulfonamide containing compounds Hives 05/05/2022     Medications: Outpatient Encounter Medications as of 07/22/2022  Medication Sig   pantoprazole (PROTONIX) 40 MG tablet Take 40 mg by mouth daily.   ALPRAZolam (XANAX) 0.25 MG tablet Take 1-2 tablets (0.25-0.5 mg total) by mouth 2 (two) times daily as needed for anxiety (will cause drowsiness.).   aspirin EC 81 MG tablet Take 81 mg by mouth daily. Swallow whole.   cholecalciferol (VITAMIN D3) 25 MCG (1000 UNIT) tablet PER PATIENT ONLY TAKES 1 BY MOUTH DURING THE WINTER MONTHS   cyclobenzaprine (FLEXERIL) 10 MG tablet Take 1 tablet (10 mg total) by mouth 3 (three) times daily as needed for muscle spasms (will cause drowsiness.).   docusate sodium (COLACE) 100 MG capsule Take 1 capsule by mouth daily.   levothyroxine (SYNTHROID) 50 MCG tablet TAKE 1 TABLET BY MOUTH DAILY BEFORE BREAKFAST.   loratadine (CLARITIN) 10 MG tablet Take 10 mg by mouth daily.   metFORMIN (GLUCOPHAGE) 1000 MG tablet Take 1 tablet (1,000 mg total) by mouth 2 (two) times daily with a meal.   oxybutynin (DITROPAN XL) 15 MG 24 hr tablet Take 1 tablet (15 mg total) by mouth at bedtime.   QUEtiapine (SEROQUEL) 300 MG tablet Take 1 tablet (300 mg total) by mouth at bedtime.   sertraline (ZOLOFT) 100 MG tablet TAKE 1 TABLET BY MOUTH DAILY   simvastatin (ZOCOR) 40 MG tablet Take 1 tablet (40 mg total) by mouth daily at 6 PM.   traMADol (ULTRAM) 50 MG tablet Take 1 tablet (50 mg total) by mouth 2 (two) times daily as needed.   zolpidem (AMBIEN) 5 MG tablet TAKE 1 TABLET (5 MG TOTAL) BY MOUTH AT BEDTIME AS NEEDED FOR SLEEP.   [DISCONTINUED] omeprazole (PRILOSEC) 40 MG capsule Take 1 capsule (40 mg total) by mouth daily. (Patient not taking: Reported on 07/22/2022)   [DISCONTINUED] sitaGLIPtin (JANUVIA) 100 MG tablet Take 1 tablet (100 mg total) by mouth daily.   No facility-administered encounter medications on file as of 07/22/2022.    Social History: Social History   Tobacco Use   Smoking status: Some Days    Packs/day:  0.50    Years: 42.00    Total pack years: 21.00    Types: Cigarettes    Last attempt to quit: 05/25/2020    Years since quitting: 2.1   Smokeless tobacco: Former   Tobacco comments:    0.5 PPD 01/28/2022     Smoked since she was 51 years old  Vaping Use   Vaping Use: Former  Substance Use Topics   Alcohol use: Not Currently   Drug use: Not Currently    Family Medical History: Family History  Problem Relation Age of Onset   Hypertension Mother    Thyroid disease Mother    Lung cancer Father 36       carcinoid, right lung   Ulcerative colitis Father    Renal Disease Maternal Grandfather    Diabetes Maternal Grandfather        Type2   Glaucoma Maternal Grandmother    Stroke Paternal Grandfather    Hypertension Paternal Grandfather     Physical Examination: Vitals:   07/22/22  0947  BP: 112/74    General: Patient is well developed, well nourished, calm, collected, and in no apparent distress. Attention to examination is appropriate.  Respiratory: Patient is breathing without any difficulty.   NEUROLOGICAL:     Awake, alert, oriented to person, place, and time.  Speech is clear and fluent. Fund of knowledge is appropriate.   Cranial Nerves: Pupils equal round and reactive to light.  Facial tone is symmetric.  Facial sensation is symmetric.   Strength: Side Biceps Triceps Deltoid Interossei Grip Wrist Ext. Wrist Flex.  R '5 5 5 5 5 5 5  '$ L '5 5 5 5 5 5 5   '$ Side Iliopsoas Quads Hamstring PF DF EHL  R '5 5 5 5 5 5  '$ L '5 5 5 5 5 5   '$ Reflexes are 2+ and symmetric at the biceps, triceps, brachioradialis, patella and achilles.   Hoffman's is absent.  Clonus is not present.   Bilateral upper and lower extremity sensation is intact to light touch.     She has mild pain with ROM of right hip.   Mild right sided lumbar tenderness with point tenderness over right SI joint.   Gait is normal.     Medical Decision Making  Imaging: Nothing new to review.   I have  personally reviewed the images and agree with the above interpretation.  Assessment and Plan: Ms. Cornforth is a pleasant 51 y.o. female with constant right > left sided LBP with some pulling in front right thigh and left posterior thigh. Leg pain is intermittent. Pain is worse with prolonged standing and walking.   She has known multilevel degenerative changes in lumbar spine, worst at L4-L5 where there is asymmetric left disc bulging and facet arthropathy resulting in severe left-sided neural foraminal stenosis and exiting nerve root impingement along with mild spinal canal and moderate right neural foraminal stenosis.   LBP likely from facets and/or right SI joint. She has point tenderness of right SI joint on exam.   She has seen ortho and is scheduled for right hip surgery (not THA) on 08/19/22.   Treatment options discussed with patient and following plan made:   - Scheduled for hip procedure on 08/19/22 as above. She will get this done and she how much pain improves.   - Will message her in November to regroup. Depending on progress, may consider referral to PMR for possible facet and/or right SI injection.  - Discussed PT for lumbar spine now. She wants to hold off.   I spent a total of 30 minutes in face-to-face and non-face-to-face activities related to this patient's care today.  Thank you for involving me in the care of this patient.   Geronimo Boot PA-C Dept. of Neurosurgery

## 2022-07-21 ENCOUNTER — Other Ambulatory Visit: Payer: Self-pay | Admitting: Orthopedic Surgery

## 2022-07-21 DIAGNOSIS — M76891 Other specified enthesopathies of right lower limb, excluding foot: Secondary | ICD-10-CM

## 2022-07-22 ENCOUNTER — Encounter: Payer: Self-pay | Admitting: Orthopedic Surgery

## 2022-07-22 ENCOUNTER — Ambulatory Visit: Payer: 59 | Admitting: Orthopedic Surgery

## 2022-07-22 VITALS — BP 112/74 | Ht 64.0 in | Wt 175.0 lb

## 2022-07-22 DIAGNOSIS — M47816 Spondylosis without myelopathy or radiculopathy, lumbar region: Secondary | ICD-10-CM

## 2022-07-24 ENCOUNTER — Other Ambulatory Visit: Payer: Self-pay

## 2022-07-27 ENCOUNTER — Other Ambulatory Visit: Payer: Self-pay | Admitting: Internal Medicine

## 2022-07-27 DIAGNOSIS — R062 Wheezing: Secondary | ICD-10-CM

## 2022-07-27 MED FILL — Zolpidem Tartrate Tab 5 MG: ORAL | 25 days supply | Qty: 15 | Fill #1 | Status: AC

## 2022-07-28 ENCOUNTER — Other Ambulatory Visit: Payer: Self-pay

## 2022-07-28 MED ORDER — CYCLOBENZAPRINE HCL 10 MG PO TABS
10.0000 mg | ORAL_TABLET | Freq: Three times a day (TID) | ORAL | 0 refills | Status: DC | PRN
  Filled 2022-07-28: qty 30, 10d supply, fill #0

## 2022-07-29 ENCOUNTER — Other Ambulatory Visit: Payer: Self-pay | Admitting: Orthopedic Surgery

## 2022-07-31 ENCOUNTER — Other Ambulatory Visit: Payer: Self-pay

## 2022-07-31 ENCOUNTER — Ambulatory Visit
Admission: RE | Admit: 2022-07-31 | Discharge: 2022-07-31 | Disposition: A | Discharge status: Home / Self Care | Payer: 59 | Source: Ambulatory Visit | Attending: Orthopedic Surgery | Admitting: Orthopedic Surgery

## 2022-07-31 DIAGNOSIS — M76891 Other specified enthesopathies of right lower limb, excluding foot: Secondary | ICD-10-CM | POA: Diagnosis not present

## 2022-07-31 DIAGNOSIS — M1611 Unilateral primary osteoarthritis, right hip: Secondary | ICD-10-CM | POA: Diagnosis not present

## 2022-08-06 MED FILL — Alprazolam Tab 0.25 MG: ORAL | 30 days supply | Qty: 30 | Fill #1 | Status: AC

## 2022-08-07 ENCOUNTER — Other Ambulatory Visit: Payer: Self-pay

## 2022-08-07 DIAGNOSIS — M76891 Other specified enthesopathies of right lower limb, excluding foot: Secondary | ICD-10-CM | POA: Diagnosis not present

## 2022-08-10 ENCOUNTER — Other Ambulatory Visit: Payer: Self-pay

## 2022-08-10 MED FILL — Zolpidem Tartrate Tab 5 MG: ORAL | 30 days supply | Qty: 30 | Fill #2 | Status: CN

## 2022-08-11 ENCOUNTER — Other Ambulatory Visit: Payer: Self-pay

## 2022-08-11 MED FILL — Zolpidem Tartrate Tab 5 MG: ORAL | 15 days supply | Qty: 15 | Fill #2 | Status: AC

## 2022-08-12 ENCOUNTER — Encounter
Admission: RE | Admit: 2022-08-12 | Discharge: 2022-08-12 | Disposition: A | Discharge status: Home / Self Care | Payer: 59 | Source: Ambulatory Visit | Attending: Orthopedic Surgery | Admitting: Orthopedic Surgery

## 2022-08-12 VITALS — Ht 64.0 in | Wt 165.0 lb

## 2022-08-12 DIAGNOSIS — E118 Type 2 diabetes mellitus with unspecified complications: Secondary | ICD-10-CM

## 2022-08-12 DIAGNOSIS — Z01812 Encounter for preprocedural laboratory examination: Secondary | ICD-10-CM

## 2022-08-12 NOTE — Patient Instructions (Addendum)
Your procedure is scheduled on: Tuesday August 19, 2022. Report to Day Surgery inside [facility] 2nd floor, stop registration desk before getting on elevator. To find out your arrival time please call [phone] between 1PM - 3PM on Monday August 18, 2022.  Remember: Instructions that are not followed completely may result in serious medical risk,  up to and including death, or upon the discretion of your surgeon and anesthesiologist your  surgery may need to be rescheduled.     _X__ 1. Do not eat food after midnight the night before your procedure.                 No chewing gum or hard candies. You may drink clear liquids up to 2 hours                 before you are scheduled to arrive for your surgery- DO not drink clear                 liquids within 2 hours of the start of your surgery.                 Clear Liquids include:  water,   __X__2.   Complete the "Ensure Clear Pre-surgery Clear Carbohydrate Drink" provided to you, 2 hours before arrival. **If you are diabetic you will be provided with an alternative drink, Gatorade Zero or G2.  __X__3.  On the morning of surgery brush your teeth with toothpaste and water, you                may rinse your mouth with mouthwash if you wish.  Do not swallow any toothpaste or mouthwash.     _X__ 4.  No Alcohol for 24 hours before or after surgery.   _X__ 5.  Do Not Smoke or use e-cigarettes For 24 Hours Prior to Your Surgery.                 Do not use any chewable tobacco products for at least 6 hours prior to                 Surgery.  _X__  6.  Do not use any recreational drugs (marijuana, cocaine, heroin, ecstasy, MDMA or other)                For at least one week prior to your surgery.  Combination of these drugs with anesthesia                May have life threatening results.  ____ 7.  Bring all medications with you on the day of surgery if instructed.   __X__8.  Notify your doctor if there is any  change in your medical condition      (cold, fever, infections).     Do not wear jewelry, make-up, hairpins, clips or nail polish. Do not wear lotions, powders, or perfumes. You may wear deodorant. Do not shave 48 hours prior to surgery.  Do not bring valuables to the hospital.    [facility] is not responsible for any belongings or valuables.  Contacts, dentures or bridgework may not be worn into surgery. Leave your suitcase in the car. After surgery it may be brought to your room. For patients admitted to the hospital, discharge time is determined by your treatment team.   Patients discharged the day of surgery will not be allowed to drive home.   Make arrangements for someone to be with you for  the first 24 hours of your Same Day Discharge.   __X__ Take these medicines the morning of surgery with A SIP OF WATER:    1. ALPRAZolam (XANAX) 0.25 MG  2. levothyroxine (SYNTHROID) 50 MCG   3. loratadine (CLARITIN) 10 MG   4. pantoprazole (PROTONIX) 40 MG   5. sertraline (ZOLOFT) 100 MG  6.  ____ Fleet Enema (as directed)   __X__ Use CHG Soap (or wipes) as directed  ____ Use Benzoyl Peroxide Gel as instructed  ____ Use inhalers on the day of surgery  __X__ Stop metFORMIN (GLUCOPHAGE) 1000 MG 2 days prior to surgery (last dose 08/16/22)    ____ Take 1/2 of usual insulin dose the night before surgery. No insulin the morning          of surgery.   __X__ aspirin and ask when to stop before your surgery.   __X__ One Week prior to surgery- Stop Anti-inflammatories such as Ibuprofen, Aleve, Advil, aspirin, Motrin, meloxicam (MOBIC), diclofenac, etodolac, ketorolac, Toradol, Daypro, piroxicam, Goody's or BC powders. OK TO USE TYLENOL IF NEEDED   __X__ Stop supplements until after surgery.    ____ Bring C-Pap to the hospital.    If you have any questions regarding your pre-procedure instructions,  Please call Pre-admit Testing at [phone]

## 2022-08-15 ENCOUNTER — Encounter
Admission: RE | Admit: 2022-08-15 | Discharge: 2022-08-15 | Disposition: A | Discharge status: Home / Self Care | Payer: 59 | Source: Ambulatory Visit | Attending: Orthopedic Surgery | Admitting: Orthopedic Surgery

## 2022-08-15 DIAGNOSIS — Z01810 Encounter for preprocedural cardiovascular examination: Secondary | ICD-10-CM | POA: Diagnosis not present

## 2022-08-15 DIAGNOSIS — E118 Type 2 diabetes mellitus with unspecified complications: Secondary | ICD-10-CM | POA: Insufficient documentation

## 2022-08-15 DIAGNOSIS — Z01812 Encounter for preprocedural laboratory examination: Secondary | ICD-10-CM

## 2022-08-15 DIAGNOSIS — Z01818 Encounter for other preprocedural examination: Secondary | ICD-10-CM | POA: Diagnosis not present

## 2022-08-15 LAB — CBC
HCT: 42.6 % (ref 36.0–46.0)
Hemoglobin: 14.3 g/dL (ref 12.0–15.0)
MCH: 30.7 pg (ref 26.0–34.0)
MCHC: 33.6 g/dL (ref 30.0–36.0)
MCV: 91.4 fL (ref 80.0–100.0)
Platelets: 350 10*3/uL (ref 150–400)
RBC: 4.66 MIL/uL (ref 3.87–5.11)
RDW: 13.1 % (ref 11.5–15.5)
WBC: 14 10*3/uL — ABNORMAL HIGH (ref 4.0–10.5)
nRBC: 0 % (ref 0.0–0.2)

## 2022-08-15 LAB — TYPE AND SCREEN
ABO/RH(D): O POS
Antibody Screen: NEGATIVE

## 2022-08-17 ENCOUNTER — Other Ambulatory Visit: Payer: Self-pay

## 2022-08-18 ENCOUNTER — Other Ambulatory Visit: Payer: Self-pay

## 2022-08-19 ENCOUNTER — Ambulatory Visit: Payer: 59 | Admitting: Anesthesiology

## 2022-08-19 ENCOUNTER — Observation Stay
Admission: RE | Admit: 2022-08-19 | Discharge: 2022-08-20 | Disposition: A | Discharge status: Home / Self Care | Payer: 59 | Attending: Orthopedic Surgery | Admitting: Orthopedic Surgery

## 2022-08-19 ENCOUNTER — Other Ambulatory Visit: Payer: Self-pay

## 2022-08-19 ENCOUNTER — Encounter: Payer: Self-pay | Admitting: Orthopedic Surgery

## 2022-08-19 ENCOUNTER — Ambulatory Visit: Payer: 59

## 2022-08-19 ENCOUNTER — Encounter
Admission: RE | Disposition: A | Discharge status: Home / Self Care | Payer: Self-pay | Source: Home / Self Care | Attending: Orthopedic Surgery

## 2022-08-19 ENCOUNTER — Ambulatory Visit: Payer: 59 | Admitting: Urgent Care

## 2022-08-19 DIAGNOSIS — J45909 Unspecified asthma, uncomplicated: Secondary | ICD-10-CM | POA: Diagnosis not present

## 2022-08-19 DIAGNOSIS — E039 Hypothyroidism, unspecified: Secondary | ICD-10-CM | POA: Insufficient documentation

## 2022-08-19 DIAGNOSIS — S73191A Other sprain of right hip, initial encounter: Secondary | ICD-10-CM | POA: Insufficient documentation

## 2022-08-19 DIAGNOSIS — M25851 Other specified joint disorders, right hip: Secondary | ICD-10-CM | POA: Diagnosis present

## 2022-08-19 DIAGNOSIS — M47816 Spondylosis without myelopathy or radiculopathy, lumbar region: Secondary | ICD-10-CM | POA: Diagnosis not present

## 2022-08-19 DIAGNOSIS — I10 Essential (primary) hypertension: Secondary | ICD-10-CM | POA: Diagnosis not present

## 2022-08-19 DIAGNOSIS — X58XXXA Exposure to other specified factors, initial encounter: Secondary | ICD-10-CM | POA: Diagnosis not present

## 2022-08-19 DIAGNOSIS — M24851 Other specific joint derangements of right hip, not elsewhere classified: Principal | ICD-10-CM | POA: Insufficient documentation

## 2022-08-19 DIAGNOSIS — Z79899 Other long term (current) drug therapy: Secondary | ICD-10-CM | POA: Diagnosis not present

## 2022-08-19 DIAGNOSIS — F17210 Nicotine dependence, cigarettes, uncomplicated: Secondary | ICD-10-CM | POA: Insufficient documentation

## 2022-08-19 DIAGNOSIS — M76891 Other specified enthesopathies of right lower limb, excluding foot: Secondary | ICD-10-CM | POA: Diagnosis not present

## 2022-08-19 DIAGNOSIS — M1612 Unilateral primary osteoarthritis, left hip: Secondary | ICD-10-CM | POA: Insufficient documentation

## 2022-08-19 DIAGNOSIS — F17200 Nicotine dependence, unspecified, uncomplicated: Secondary | ICD-10-CM | POA: Diagnosis not present

## 2022-08-19 DIAGNOSIS — M1611 Unilateral primary osteoarthritis, right hip: Secondary | ICD-10-CM | POA: Diagnosis not present

## 2022-08-19 DIAGNOSIS — E118 Type 2 diabetes mellitus with unspecified complications: Secondary | ICD-10-CM

## 2022-08-19 DIAGNOSIS — Z0389 Encounter for observation for other suspected diseases and conditions ruled out: Secondary | ICD-10-CM | POA: Diagnosis not present

## 2022-08-19 HISTORY — PX: HIP ARTHROSCOPY: SHX668

## 2022-08-19 LAB — GLUCOSE, CAPILLARY
Glucose-Capillary: 124 mg/dL — ABNORMAL HIGH (ref 70–99)
Glucose-Capillary: 166 mg/dL — ABNORMAL HIGH (ref 70–99)

## 2022-08-19 SURGERY — ARTHROSCOPY HIP
Anesthesia: General | Site: Hip | Laterality: Right

## 2022-08-19 MED ORDER — PROPOFOL 10 MG/ML IV BOLUS
INTRAVENOUS | Status: DC | PRN
  Administered 2022-08-19: 150 mg via INTRAVENOUS

## 2022-08-19 MED ORDER — KETOROLAC TROMETHAMINE 30 MG/ML IJ SOLN
INTRAMUSCULAR | Status: DC | PRN
  Administered 2022-08-19: 30 mg via INTRAVENOUS

## 2022-08-19 MED ORDER — METHOCARBAMOL 1000 MG/10ML IJ SOLN
500.0000 mg | Freq: Four times a day (QID) | INTRAVENOUS | Status: DC | PRN
  Administered 2022-08-19: 500 mg via INTRAVENOUS
  Filled 2022-08-19: qty 500

## 2022-08-19 MED ORDER — FENTANYL CITRATE (PF) 100 MCG/2ML IJ SOLN
INTRAMUSCULAR | Status: AC
  Filled 2022-08-19: qty 2

## 2022-08-19 MED ORDER — HYDROMORPHONE HCL 1 MG/ML IJ SOLN: 0.5000 mg | INTRAMUSCULAR | Status: DC | PRN

## 2022-08-19 MED ORDER — MIDAZOLAM HCL 5 MG/5ML IJ SOLN
INTRAMUSCULAR | Status: DC | PRN
  Administered 2022-08-19: 2 mg via INTRAVENOUS

## 2022-08-19 MED ORDER — OXYCODONE HCL 5 MG PO TABS: 10.0000 mg | ORAL_TABLET | ORAL | Status: DC | PRN

## 2022-08-19 MED ORDER — LACTATED RINGERS IR SOLN
Status: DC | PRN
  Administered 2022-08-19 (×8): 6000 mL

## 2022-08-19 MED ORDER — PHENYLEPHRINE HCL-NACL 20-0.9 MG/250ML-% IV SOLN
INTRAVENOUS | Status: DC | PRN
  Administered 2022-08-19: 30 ug/min via INTRAVENOUS

## 2022-08-19 MED ORDER — EPINEPHRINE PF 1 MG/ML IJ SOLN
INTRAMUSCULAR | Status: AC
  Filled 2022-08-19: qty 4

## 2022-08-19 MED ORDER — CEFAZOLIN SODIUM 1 G IJ SOLR
INTRAMUSCULAR | Status: AC
  Filled 2022-08-19: qty 20

## 2022-08-19 MED ORDER — ONDANSETRON HCL 4 MG/2ML IJ SOLN
INTRAMUSCULAR | Status: DC | PRN
  Administered 2022-08-19: 4 mg via INTRAVENOUS

## 2022-08-19 MED ORDER — QUETIAPINE FUMARATE 300 MG PO TABS
300.0000 mg | ORAL_TABLET | Freq: Every day | ORAL | Status: DC
  Administered 2022-08-19: 300 mg via ORAL
  Filled 2022-08-19: qty 1

## 2022-08-19 MED ORDER — IPRATROPIUM-ALBUTEROL 0.5-2.5 (3) MG/3ML IN SOLN: 3.0000 mL | Freq: Once | RESPIRATORY_TRACT | Status: AC

## 2022-08-19 MED ORDER — FENTANYL CITRATE (PF) 100 MCG/2ML IJ SOLN
INTRAMUSCULAR | Status: DC | PRN
  Administered 2022-08-19 (×3): 50 ug via INTRAVENOUS

## 2022-08-19 MED ORDER — SUCCINYLCHOLINE CHLORIDE 200 MG/10ML IV SOSY
PREFILLED_SYRINGE | INTRAVENOUS | Status: AC
  Filled 2022-08-19: qty 10

## 2022-08-19 MED ORDER — KETOROLAC TROMETHAMINE 30 MG/ML IJ SOLN
INTRAMUSCULAR | Status: AC
  Filled 2022-08-19: qty 1

## 2022-08-19 MED ORDER — ORAL CARE MOUTH RINSE: 15.0000 mL | Freq: Once | OROMUCOSAL | Status: AC

## 2022-08-19 MED ORDER — DEXAMETHASONE SODIUM PHOSPHATE 10 MG/ML IJ SOLN
INTRAMUSCULAR | Status: AC
  Filled 2022-08-19: qty 1

## 2022-08-19 MED ORDER — PHENYLEPHRINE HCL (PRESSORS) 10 MG/ML IV SOLN
INTRAVENOUS | Status: AC
  Filled 2022-08-19: qty 1

## 2022-08-19 MED ORDER — BUPIVACAINE HCL (PF) 0.5 % IJ SOLN
INTRAMUSCULAR | Status: AC
  Filled 2022-08-19: qty 30

## 2022-08-19 MED ORDER — DOCUSATE SODIUM 100 MG PO CAPS
100.0000 mg | ORAL_CAPSULE | Freq: Two times a day (BID) | ORAL | Status: DC
  Administered 2022-08-19: 100 mg via ORAL

## 2022-08-19 MED ORDER — SUCCINYLCHOLINE CHLORIDE 200 MG/10ML IV SOSY
PREFILLED_SYRINGE | INTRAVENOUS | Status: DC | PRN
  Administered 2022-08-19: 120 mg via INTRAVENOUS

## 2022-08-19 MED ORDER — ROCURONIUM BROMIDE 10 MG/ML (PF) SYRINGE
PREFILLED_SYRINGE | INTRAVENOUS | Status: AC
  Filled 2022-08-19: qty 10

## 2022-08-19 MED ORDER — SUGAMMADEX SODIUM 200 MG/2ML IV SOLN
INTRAVENOUS | Status: DC | PRN
  Administered 2022-08-19: 200 mg via INTRAVENOUS

## 2022-08-19 MED ORDER — OXYCODONE HCL 5 MG PO TABS
ORAL_TABLET | ORAL | Status: AC
  Administered 2022-08-19: 10 mg
  Filled 2022-08-19: qty 2

## 2022-08-19 MED ORDER — ACETAMINOPHEN 500 MG PO TABS
ORAL_TABLET | ORAL | Status: AC
  Filled 2022-08-19: qty 2

## 2022-08-19 MED ORDER — METHOCARBAMOL 500 MG PO TABS: 500.0000 mg | ORAL_TABLET | Freq: Four times a day (QID) | ORAL | Status: DC | PRN

## 2022-08-19 MED ORDER — ONDANSETRON HCL 4 MG/2ML IJ SOLN: 4.0000 mg | Freq: Four times a day (QID) | INTRAMUSCULAR | Status: DC | PRN

## 2022-08-19 MED ORDER — ACETAMINOPHEN 500 MG PO TABS
ORAL_TABLET | ORAL | Status: AC
  Administered 2022-08-19: 1000 mg via ORAL
  Filled 2022-08-19: qty 2

## 2022-08-19 MED ORDER — ALPRAZOLAM 0.5 MG PO TABS: 0.2500 mg | ORAL_TABLET | Freq: Two times a day (BID) | ORAL | Status: DC | PRN

## 2022-08-19 MED ORDER — PANTOPRAZOLE SODIUM 40 MG PO TBEC
40.0000 mg | DELAYED_RELEASE_TABLET | Freq: Every day | ORAL | Status: DC
  Administered 2022-08-19: 40 mg via ORAL

## 2022-08-19 MED ORDER — CELECOXIB 200 MG PO CAPS: 200.0000 mg | ORAL_CAPSULE | Freq: Once | ORAL | Status: DC

## 2022-08-19 MED ORDER — SENNOSIDES-DOCUSATE SODIUM 8.6-50 MG PO TABS: 1.0000 | ORAL_TABLET | Freq: Every evening | ORAL | Status: DC | PRN

## 2022-08-19 MED ORDER — OXYCODONE HCL 5 MG/5ML PO SOLN: 5.0000 mg | Freq: Once | ORAL | Status: AC | PRN

## 2022-08-19 MED ORDER — HYDROMORPHONE HCL 1 MG/ML IJ SOLN
0.2500 mg | INTRAMUSCULAR | Status: DC | PRN
  Administered 2022-08-19: 0.5 mg via INTRAVENOUS

## 2022-08-19 MED ORDER — BUPIVACAINE LIPOSOME 1.3 % IJ SUSP
INTRAMUSCULAR | Status: AC
  Filled 2022-08-19: qty 20

## 2022-08-19 MED ORDER — ALBUTEROL SULFATE (2.5 MG/3ML) 0.083% IN NEBU: 1.2500 mg | INHALATION_SOLUTION | Freq: Four times a day (QID) | RESPIRATORY_TRACT | Status: DC | PRN

## 2022-08-19 MED ORDER — BUPIVACAINE HCL (PF) 0.5 % IJ SOLN
INTRAMUSCULAR | Status: AC
  Filled 2022-08-19: qty 10

## 2022-08-19 MED ORDER — DOCUSATE SODIUM 100 MG PO CAPS
ORAL_CAPSULE | ORAL | Status: AC
  Administered 2022-08-19: 100 mg via ORAL
  Filled 2022-08-19: qty 1

## 2022-08-19 MED ORDER — KETOROLAC TROMETHAMINE 15 MG/ML IJ SOLN
INTRAMUSCULAR | Status: AC
  Administered 2022-08-19: 15 mg via INTRAVENOUS
  Filled 2022-08-19: qty 1

## 2022-08-19 MED ORDER — LIDOCAINE HCL (PF) 2 % IJ SOLN
INTRAMUSCULAR | Status: AC
  Filled 2022-08-19: qty 5

## 2022-08-19 MED ORDER — PHENYLEPHRINE 80 MCG/ML (10ML) SYRINGE FOR IV PUSH (FOR BLOOD PRESSURE SUPPORT)
PREFILLED_SYRINGE | INTRAVENOUS | Status: DC | PRN
  Administered 2022-08-19: 160 ug via INTRAVENOUS
  Administered 2022-08-19 (×2): 80 ug via INTRAVENOUS

## 2022-08-19 MED ORDER — SODIUM CHLORIDE 0.9 % IV SOLN: INTRAVENOUS | Status: DC

## 2022-08-19 MED ORDER — KETOROLAC TROMETHAMINE 15 MG/ML IJ SOLN: 15.0000 mg | Freq: Four times a day (QID) | INTRAMUSCULAR | Status: DC

## 2022-08-19 MED ORDER — CEFAZOLIN SODIUM-DEXTROSE 2-4 GM/100ML-% IV SOLN
INTRAVENOUS | Status: AC
  Filled 2022-08-19: qty 100

## 2022-08-19 MED ORDER — GABAPENTIN 300 MG PO CAPS
ORAL_CAPSULE | ORAL | Status: AC
  Filled 2022-08-19: qty 1

## 2022-08-19 MED ORDER — KETAMINE HCL 50 MG/5ML IJ SOSY
PREFILLED_SYRINGE | INTRAMUSCULAR | Status: AC
  Filled 2022-08-19: qty 5

## 2022-08-19 MED ORDER — ASPIRIN 325 MG PO TBEC: 325.0000 mg | DELAYED_RELEASE_TABLET | Freq: Every day | ORAL | Status: DC

## 2022-08-19 MED ORDER — OXYCODONE HCL 5 MG PO TABS
5.0000 mg | ORAL_TABLET | Freq: Once | ORAL | Status: AC | PRN
  Administered 2022-08-19: 5 mg via ORAL

## 2022-08-19 MED ORDER — KETOROLAC TROMETHAMINE 15 MG/ML IJ SOLN
INTRAMUSCULAR | Status: AC
  Administered 2022-08-19: 15 mg
  Filled 2022-08-19: qty 1

## 2022-08-19 MED ORDER — PANTOPRAZOLE SODIUM 40 MG PO TBEC
DELAYED_RELEASE_TABLET | ORAL | Status: AC
  Filled 2022-08-19: qty 1

## 2022-08-19 MED ORDER — MIDAZOLAM HCL 2 MG/2ML IJ SOLN
INTRAMUSCULAR | Status: AC
  Filled 2022-08-19: qty 2

## 2022-08-19 MED ORDER — LIDOCAINE HCL (CARDIAC) PF 100 MG/5ML IV SOSY
PREFILLED_SYRINGE | INTRAVENOUS | Status: DC | PRN
  Administered 2022-08-19: 100 mg via INTRAVENOUS

## 2022-08-19 MED ORDER — CEFAZOLIN SODIUM-DEXTROSE 2-4 GM/100ML-% IV SOLN: 2.0000 g | Freq: Four times a day (QID) | INTRAVENOUS | Status: DC

## 2022-08-19 MED ORDER — PHENYLEPHRINE HCL-NACL 20-0.9 MG/250ML-% IV SOLN
INTRAVENOUS | Status: AC
  Filled 2022-08-19: qty 250

## 2022-08-19 MED ORDER — DOCUSATE SODIUM 100 MG PO CAPS
ORAL_CAPSULE | ORAL | Status: AC
  Filled 2022-08-19: qty 1

## 2022-08-19 MED ORDER — CEFAZOLIN SODIUM-DEXTROSE 2-4 GM/100ML-% IV SOLN
2.0000 g | INTRAVENOUS | Status: AC
  Administered 2022-08-19 (×2): 2 g via INTRAVENOUS

## 2022-08-19 MED ORDER — OXYCODONE HCL 5 MG PO TABS
ORAL_TABLET | ORAL | Status: AC
  Administered 2022-08-19: 5 mg via ORAL
  Filled 2022-08-19: qty 1

## 2022-08-19 MED ORDER — BUPIVACAINE LIPOSOME 1.3 % IJ SUSP
INTRAMUSCULAR | Status: DC | PRN
  Administered 2022-08-19: 50 mL

## 2022-08-19 MED ORDER — ACETAMINOPHEN 500 MG PO TABS
1000.0000 mg | ORAL_TABLET | Freq: Once | ORAL | Status: AC
  Administered 2022-08-19: 1000 mg via ORAL

## 2022-08-19 MED ORDER — SODIUM CHLORIDE (PF) 0.9 % IJ SOLN
INTRAMUSCULAR | Status: AC
  Filled 2022-08-19: qty 20

## 2022-08-19 MED ORDER — LACTATED RINGERS IR SOLN
Status: DC | PRN
  Administered 2022-08-19: 12000 mL

## 2022-08-19 MED ORDER — DIPHENHYDRAMINE HCL 12.5 MG/5ML PO ELIX: 12.5000 mg | ORAL_SOLUTION | ORAL | Status: DC | PRN

## 2022-08-19 MED ORDER — OXYCODONE HCL 5 MG PO TABS: 5.0000 mg | ORAL_TABLET | ORAL | Status: DC | PRN

## 2022-08-19 MED ORDER — ZOLPIDEM TARTRATE 5 MG PO TABS
5.0000 mg | ORAL_TABLET | Freq: Every evening | ORAL | Status: DC | PRN
  Filled 2022-08-19: qty 1

## 2022-08-19 MED ORDER — NAPROXEN 500 MG PO TABS
500.0000 mg | ORAL_TABLET | Freq: Two times a day (BID) | ORAL | Status: DC
  Filled 2022-08-19: qty 1

## 2022-08-19 MED ORDER — CHLORHEXIDINE GLUCONATE 0.12 % MT SOLN: 15.0000 mL | Freq: Once | OROMUCOSAL | Status: AC

## 2022-08-19 MED ORDER — DOCUSATE SODIUM 100 MG PO CAPS: 100.0000 mg | ORAL_CAPSULE | Freq: Every day | ORAL | Status: DC

## 2022-08-19 MED ORDER — OXYBUTYNIN CHLORIDE ER 5 MG PO TB24: 15.0000 mg | ORAL_TABLET | Freq: Every day | ORAL | Status: DC

## 2022-08-19 MED ORDER — LORATADINE 10 MG PO TABS: 10.0000 mg | ORAL_TABLET | Freq: Every day | ORAL | Status: DC

## 2022-08-19 MED ORDER — HYDROMORPHONE HCL 1 MG/ML IJ SOLN
INTRAMUSCULAR | Status: AC
  Administered 2022-08-19: 0.5 mg via INTRAVENOUS
  Filled 2022-08-19: qty 1

## 2022-08-19 MED ORDER — SERTRALINE HCL 50 MG PO TABS
ORAL_TABLET | ORAL | Status: AC
  Filled 2022-08-19: qty 2

## 2022-08-19 MED ORDER — ROCURONIUM BROMIDE 100 MG/10ML IV SOLN
INTRAVENOUS | Status: DC | PRN
  Administered 2022-08-19: 10 mg via INTRAVENOUS
  Administered 2022-08-19: 30 mg via INTRAVENOUS
  Administered 2022-08-19: 20 mg via INTRAVENOUS
  Administered 2022-08-19: 10 mg via INTRAVENOUS
  Administered 2022-08-19: 20 mg via INTRAVENOUS

## 2022-08-19 MED ORDER — ONDANSETRON HCL 4 MG PO TABS: 4.0000 mg | ORAL_TABLET | Freq: Four times a day (QID) | ORAL | Status: DC | PRN

## 2022-08-19 MED ORDER — KETAMINE HCL 10 MG/ML IJ SOLN
INTRAMUSCULAR | Status: DC | PRN
  Administered 2022-08-19: 20 mg via INTRAVENOUS
  Administered 2022-08-19: 30 mg via INTRAVENOUS

## 2022-08-19 MED ORDER — SERTRALINE HCL 50 MG PO TABS
100.0000 mg | ORAL_TABLET | Freq: Every day | ORAL | Status: DC
  Administered 2022-08-19: 100 mg via ORAL

## 2022-08-19 MED ORDER — OXYCODONE HCL 5 MG PO TABS
ORAL_TABLET | ORAL | Status: AC
  Filled 2022-08-19: qty 1

## 2022-08-19 MED ORDER — IPRATROPIUM-ALBUTEROL 0.5-2.5 (3) MG/3ML IN SOLN
RESPIRATORY_TRACT | Status: AC
  Administered 2022-08-19: 3 mL via RESPIRATORY_TRACT
  Filled 2022-08-19: qty 3

## 2022-08-19 MED ORDER — CYCLOBENZAPRINE HCL 10 MG PO TABS: 10.0000 mg | ORAL_TABLET | Freq: Three times a day (TID) | ORAL | Status: DC | PRN

## 2022-08-19 MED ORDER — CHLORHEXIDINE GLUCONATE 0.12 % MT SOLN
OROMUCOSAL | Status: AC
  Administered 2022-08-19: 15 mL via OROMUCOSAL
  Filled 2022-08-19: qty 15

## 2022-08-19 MED ORDER — ACETAMINOPHEN 325 MG PO TABS: 325.0000 mg | ORAL_TABLET | Freq: Four times a day (QID) | ORAL | Status: DC | PRN

## 2022-08-19 MED ORDER — CELECOXIB 200 MG PO CAPS
ORAL_CAPSULE | ORAL | Status: AC
  Filled 2022-08-19: qty 1

## 2022-08-19 MED ORDER — ACETAMINOPHEN 500 MG PO TABS
1000.0000 mg | ORAL_TABLET | Freq: Three times a day (TID) | ORAL | Status: DC
  Administered 2022-08-19: 1000 mg via ORAL

## 2022-08-19 MED ORDER — OXYBUTYNIN CHLORIDE ER 5 MG PO TB24
ORAL_TABLET | ORAL | Status: AC
  Administered 2022-08-19: 15 mg via ORAL
  Filled 2022-08-19: qty 3

## 2022-08-19 MED ORDER — GABAPENTIN 300 MG PO CAPS
300.0000 mg | ORAL_CAPSULE | Freq: Once | ORAL | Status: AC
  Administered 2022-08-19: 300 mg via ORAL

## 2022-08-19 MED ORDER — PROPOFOL 1000 MG/100ML IV EMUL
INTRAVENOUS | Status: AC
  Filled 2022-08-19: qty 100

## 2022-08-19 MED ORDER — ONDANSETRON HCL 4 MG/2ML IJ SOLN
INTRAMUSCULAR | Status: AC
  Filled 2022-08-19: qty 2

## 2022-08-19 MED ORDER — DEXAMETHASONE SODIUM PHOSPHATE 10 MG/ML IJ SOLN
INTRAMUSCULAR | Status: DC | PRN
  Administered 2022-08-19: 5 mg via INTRAVENOUS

## 2022-08-19 MED ORDER — LEVOTHYROXINE SODIUM 50 MCG PO TABS
50.0000 ug | ORAL_TABLET | Freq: Every day | ORAL | Status: DC
  Administered 2022-08-20: 50 ug via ORAL
  Filled 2022-08-19: qty 1

## 2022-08-19 SURGICAL SUPPLY — 64 items
ADAPTER IRRIG TUBE 2 SPIKE SOL (ADAPTER) ×2 IMPLANT
ANCHOR SUT 2.4 SS KNTLS #1 (Anchor) IMPLANT
BIT DRILL SS CINCHLOCK (BIT) ×1 IMPLANT
BLADE SAMURAI STR FULL RADIUS (BLADE) ×1 IMPLANT
BLADE SURG SZ11 CARB STEEL (BLADE) ×1 IMPLANT
BNDG ADH 1X3 SHEER STRL LF (GAUZE/BANDAGES/DRESSINGS) ×5 IMPLANT
BUR 4.0 ROUND XL DIAMOND (BUR) ×1 IMPLANT
BUR 5.5 ROUND LONG FS 8 FLUTE (BUR) ×1 IMPLANT
CANNULA 8 456 TRANSPORT (CANNULA) IMPLANT
CANNULA 8 789 TRANSPORT (CANNULA) ×1 IMPLANT
CANNULA OBTURATOR FLOWPORT (CANNULA) ×1 IMPLANT
CHLORAPREP W/TINT 26 (MISCELLANEOUS) ×1 IMPLANT
COOLER POLAR GLACIER W/PUMP (MISCELLANEOUS) IMPLANT
CUTTER AGGRESSIVE PLUS 4D 180L (CUTTER) ×1 IMPLANT
DEVICE SUCT BLK HOLE OR FLOOR (MISCELLANEOUS) ×1 IMPLANT
DRAPE ARTHRO LIMB 89X125 STRL (DRAPES) ×1 IMPLANT
DRAPE C-ARM 42X72 X-RAY (DRAPES) ×1 IMPLANT
DRAPE SURG 17X11 SM STRL (DRAPES) ×1 IMPLANT
GAUZE SPONGE 4X4 12PLY STRL (GAUZE/BANDAGES/DRESSINGS) ×1 IMPLANT
GLOVE BIO SURGEON STRL SZ7.5 (GLOVE) ×2 IMPLANT
GLOVE BIOGEL PI IND STRL 8 (GLOVE) ×1 IMPLANT
GLOVE SURG ORTHO 8.0 STRL STRW (GLOVE) ×2 IMPLANT
GLOVE SURG UNDER LTX SZ8 (GLOVE) ×1 IMPLANT
GOWN STRL REUS W/ TWL LRG LVL3 (GOWN DISPOSABLE) ×1 IMPLANT
GOWN STRL REUS W/ TWL XL LVL3 (GOWN DISPOSABLE) ×1 IMPLANT
GOWN STRL REUS W/TWL LRG LVL3 (GOWN DISPOSABLE) ×1
GOWN STRL REUS W/TWL XL LVL3 (GOWN DISPOSABLE) ×1
IV LACTATED RINGER IRRG 3000ML (IV SOLUTION) ×20
IV LR IRRIG 3000ML ARTHROMATIC (IV SOLUTION) ×6 IMPLANT
KIT PATIENT POSITION MEDIUM (KITS) ×1 IMPLANT
KIT PORTAL ENTRY HIP ACCESS (KITS) ×1 IMPLANT
MANIFOLD NEPTUNE II (INSTRUMENTS) ×2 IMPLANT
MAT ABSORB  FLUID 56X50 GRAY (MISCELLANEOUS) ×1
MAT ABSORB FLUID 56X50 GRAY (MISCELLANEOUS) ×1 IMPLANT
NDL INJECTOR II CARTRIDGE (MISCELLANEOUS) ×1 IMPLANT
NDL SAFETY ECLIP 18X1.5 (MISCELLANEOUS) ×1 IMPLANT
NEEDLE INJECTOR II CARTRIDGE (MISCELLANEOUS) ×1 IMPLANT
PACK ARTHROSCOPY KNEE (MISCELLANEOUS) ×2 IMPLANT
PAD ABD DERMACEA PRESS 5X9 (GAUZE/BANDAGES/DRESSINGS) ×1 IMPLANT
PAD ARMBOARD 7.5X6 YLW CONV (MISCELLANEOUS) ×1 IMPLANT
PAD WRAPON POLOR MULTI XL (MISCELLANEOUS) IMPLANT
PASSER SUT 1.5D CRESCENT (INSTRUMENTS) ×1 IMPLANT
PASSER SUT 70D UP ANGLED (INSTRUMENTS) ×1 IMPLANT
SERFAS 50-S SWEEP XL (INSTRUMENTS) ×1
SLEEVE REMOTE CONTROL 5X12 (DRAPES) IMPLANT
SPONGE T-LAP 18X18 ~~LOC~~+RFID (SPONGE) ×1 IMPLANT
SUT ETHILON 3-0 FS-10 30 BLK (SUTURE) ×1
SUT VIC AB 2-0 CT2 27 (SUTURE) ×1 IMPLANT
SUT XBRAID 1.4 BLK/WHT (SUTURE) IMPLANT
SUT XBRAID 1.4 BLUE (SUTURE) IMPLANT
SUT ZIPLINE SZ2 BLK (SUTURE) ×3 IMPLANT
SUT ZIPLINE SZ2 GREEN (SUTURE) ×6 IMPLANT
SUTURE EHLN 3-0 FS-10 30 BLK (SUTURE) ×1 IMPLANT
SUTURE TAPE XBRAID 1.2 BLUE 45 (SUTURE) IMPLANT
SUTURETAPE XBRAID 1.2 BLUE 45 (SUTURE) ×1
TRAP FLUID SMOKE EVACUATOR (MISCELLANEOUS) ×1 IMPLANT
TRAY FOLEY SLVR 16FR LF STAT (SET/KITS/TRAYS/PACK) ×1 IMPLANT
TUBING CONNECTING 10 (TUBING) IMPLANT
TUBING INFLOW SET DBFLO PUMP (TUBING) ×1 IMPLANT
TUBING OUTFLOW SET DBLFO PUMP (TUBING) ×1 IMPLANT
WAND SERFAS 50-S SWEEP XL (INSTRUMENTS) ×1 IMPLANT
WATER STERILE IRR 500ML POUR (IV SOLUTION) ×1 IMPLANT
WRAP-ON POLOR PAD MULTI XL (MISCELLANEOUS) ×1
WRAPON POLOR PAD MULTI XL (MISCELLANEOUS) ×1

## 2022-08-19 NOTE — Progress Notes (Signed)
Patient requested night medications scheduled for 10:00pm "to be given early to help settle her in"

## 2022-08-19 NOTE — H&P (Signed)
Paper H&P to be scanned into permanent record. H&P reviewed. No significant changes noted.  

## 2022-08-19 NOTE — Anesthesia Postprocedure Evaluation (Signed)
Anesthesia Post Note  Patient: Kathleen Carr  Procedure(s) Performed: Right hip arthroscopy, acetabuloplasty, labral repair, femoral osteochondroplasty, capsular closure (Right: Hip)  Patient location during evaluation: PACU Anesthesia Type: General Level of consciousness: awake and alert Pain management: pain level controlled Vital Signs Assessment: post-procedure vital signs reviewed and stable Respiratory status: spontaneous breathing, nonlabored ventilation, respiratory function stable and patient connected to nasal cannula oxygen Cardiovascular status: blood pressure returned to baseline and stable Postop Assessment: no apparent nausea or vomiting Anesthetic complications: no   No notable events documented.   Last Vitals:  Vitals:   08/19/22 1330 08/19/22 1345  BP: 117/62 116/67  Pulse: 86   Resp: 14   Temp:    SpO2: 95%     Last Pain:  Vitals:   08/19/22 1345  TempSrc:   PainSc: Asleep                 Ilene Qua

## 2022-08-19 NOTE — Anesthesia Preprocedure Evaluation (Signed)
Anesthesia Evaluation  Patient identified by MRN, date of birth, ID band Patient awake    Reviewed: Allergy & Precautions, NPO status , Patient's Chart, lab work & pertinent test results  History of Anesthesia Complications Negative for: history of anesthetic complications  Airway Mallampati: II  TM Distance: >3 FB Neck ROM: full    Dental  (+) Chipped, Partial Lower   Pulmonary neg pulmonary ROS, asthma , Current Smoker and Patient abstained from smoking.,    Pulmonary exam normal        Cardiovascular hypertension, On Medications negative cardio ROS Normal cardiovascular exam     Neuro/Psych PSYCHIATRIC DISORDERS Anxiety Depression Bipolar Disorder  Neuromuscular disease    GI/Hepatic negative GI ROS, Neg liver ROS, GERD  ,  Endo/Other  diabetesHypothyroidism   Renal/GU negative Renal ROS  negative genitourinary   Musculoskeletal   Abdominal   Peds  Hematology negative hematology ROS (+)   Anesthesia Other Findings Past Medical History: No date: Allergy No date: Anxiety No date: Arthritis No date: Asthma No date: Bipolar depression ([place]) No date: Carpal tunnel syndrome 01/14/2022: Cat bite No date: Depression No date: Diabetes mellitus without complication (HCC) No date: GERD (gastroesophageal reflux disease) No date: Hypertension No date: Hypothyroidism 01/14/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 No date: PTSD (post-traumatic stress disorder) No date: PTSD (post-traumatic stress disorder) No date: Smoker No date: Thyroid disease  Past Surgical History: No date: ABDOMINAL HYSTERECTOMY     Comment:  complete 2021: BACK SURGERY     Comment:  lumbar surgery 03/12/2022: CARPAL TUNNEL RELEASE; Right     Comment:  Procedure: RIGHT CARPAL TUNNEL RELEASE;  Surgeon:               Sherilyn Cooter, MD;  Location: [place];  Service: Orthopedics;  Laterality: Right; 04/16/2022: CARPAL  TUNNEL RELEASE; Left     Comment:  Procedure: LEFT CARPAL TUNNEL RELEASE;  Surgeon:               Sherilyn Cooter, MD;  Location: [place] [place];  Service: Orthopedics;  Laterality: Left; No date: CHOLECYSTECTOMY 02/05/2022: COLONOSCOPY WITH PROPOFOL; N/A     Comment:  Procedure: COLONOSCOPY WITH PROPOFOL;  Surgeon: [name], MD;  Location: [facility] ENDOSCOPY;  Service:               Gastroenterology;  Laterality: N/A; 02/05/2022: ESOPHAGOGASTRODUODENOSCOPY; N/A     Comment:  Procedure: ESOPHAGOGASTRODUODENOSCOPY (EGD);  Surgeon:               [name], MD;  Location: [facility] ENDOSCOPY;  Service:               Gastroenterology;  Laterality: N/A; No date: KNEE ARTHROSCOPY; Bilateral No date: TONSILLECTOMY     Comment:  as a child  BMI    Body Mass Index: 28.32 kg/m      Reproductive/Obstetrics negative OB ROS                            Anesthesia Physical Anesthesia Plan  ASA: 2  Anesthesia Plan: General   Post-op Pain Management: Dilaudid IV, Tylenol PO (pre-op)* and Gabapentin PO (pre-op)*   Induction: Intravenous  PONV Risk Score and Plan: 4 or greater  and Dexamethasone, Midazolam and Treatment may vary due to age or medical condition  Airway Management Planned: Oral ETT  Additional Equipment:   Intra-op Plan:   Post-operative Plan: Extubation in OR  Informed Consent: I have reviewed the patients History and Physical, chart, labs and discussed the procedure including the risks, benefits and alternatives for the proposed anesthesia with the patient or authorized representative who has indicated his/her understanding and acceptance.     Dental Advisory Given  Plan Discussed with: Anesthesiologist, CRNA and Surgeon  Anesthesia Plan Comments: (Patient consented for risks of anesthesia including but not limited to:  - adverse reactions to medications - damage to eyes, teeth, lips or other oral  mucosa - nerve damage due to positioning  - risk of bleeding, infection and or nerve damage from spinal that could lead to paralysis - risk of headache or failed spinal - damage to teeth, lips or other oral mucosa - sore throat or hoarseness - damage to heart, brain, nerves, lungs, other parts of body or loss of life  Patient voiced understanding.)       Anesthesia Quick Evaluation

## 2022-08-19 NOTE — Anesthesia Procedure Notes (Signed)
Procedure Name: Intubation Date/Time: 08/19/2022 7:58 AM  Performed by: Fredderick Phenix, CRNAPre-anesthesia Checklist: Patient identified, Emergency Drugs available, Suction available and Patient being monitored Patient Re-evaluated:Patient Re-evaluated prior to induction Oxygen Delivery Method: Circle system utilized Preoxygenation: Pre-oxygenation with 100% oxygen Induction Type: IV induction Ventilation: Mask ventilation without difficulty Laryngoscope Size: Mac and 4 Grade View: Grade II Tube type: Oral Tube size: 7.0 mm Number of attempts: 1 Airway Equipment and Method: Stylet and Oral airway Placement Confirmation: ETT inserted through vocal cords under direct vision, positive ETCO2 and breath sounds checked- equal and bilateral Secured at: 21 cm Tube secured with: Tape Dental Injury: Teeth and Oropharynx as per pre-operative assessment

## 2022-08-19 NOTE — OR Nursing (Signed)
Traction up @ 0816 and Traction down @ 1028

## 2022-08-19 NOTE — Op Note (Signed)
Operative Note    SURGERY DATE: 08/19/2022   PRE-OP DIAGNOSIS:  1. Right femoroacetabular impingement 2. Right hip labral tear   POST-OP DIAGNOSIS:  1. Right femoroacetabular impingement 2. Right hip labral tear 3. Right hip early degenerative changes   PROCEDURES:  1. Right hip arthroscopy with acetabuloplasty, labral repair, femoral osteochondroplasty, and capsular closure   SURGEON: Cato Mulligan, MD  ASSISTANT: Reche Dixon, PA   ANESTHESIA: Gen   ESTIMATED BLOOD LOSS: minimal   TOTAL IV FLUIDS: per anesthesia  INDICATION(S): The patient is a 51 y.o. year old female With over 8 months of groin pain with associated catching and clicking sensations.  She did get significant relief of her symptoms with ultrasound-guided corticosteroid injection to the hip joint, but had persistent catching and clicking sensations.  An MR arthrogram showed a hip labral tear.  She had undergone extensive physical therapy previously.  She has also had prior spine surgery in November 2021.  She also has some radicular type symptoms which she understands will not necessarily be improved with the surgery. Please see the preoperative notes for further detail.   She elected to undergo the above mentioned procedure after detailed explanation of the expected outcomes and recovery path.   Informed consent was obtained outlining the expected benefits and possible risks of the surgery including a less than 5% chance of numbness in the sciatic or pudendal nerve regions, 20% chance of injury to the lateral femoral cutaneous nerve (1% permanent injury). Other risks include continued pain due to preexisting chondromalacia and other general risks of surgery such as blood clots, infection and bleeding.  OPERATIVE FINDINGS: Cartilage Significant degenerative changes of the acetabular cartilage along the labral tear extending 43m into the joint at the apex High grade cartilage lesion: yes, full-thickness chondral  flap Radial dimension-distance to the rim (mm): 5 mm at the apex Length along the periphery: 11 o'clock to 1 o'clock position Delamination: no Bone exposed: no Bruising: no Localization of femoral head high grade lesion: none  Cotyloid fossa osteophytes:  none The remainder of the femoral and acetabular cartilage was normal.  Labrum Labral degeneration: Severe degeneration present, especially from 11:00 - 12:00 Complexity of tearing:  Tear at chondrolabral junction with severe degeneration of the labrum Hypoplastic labrum: no Hyperplastic labrum:  no Lipstick sign at the psoas prominence: none Psoas Prominence: no  Boundaries of labral tear Convention (3 o'clock anterior, 9 o'clock posterior) Anterior boundary: 11 o'clock Posterior boundary: 3 o'clock  Ligamentum teres Hypertrophy:  no Tear: no   OPERATIVE REPORT:  The patient was brought to the operating room, placed supine on the operating table, and bony prominences were padded.  The traction boots were applied with padding to ensure that safe traction could be applied through the feet.  The contralateral limb was abducted slightly and light traction was applied.  The operative leg was brought into neutral position.  Appropriate preoperative IV antibiotics were administered. The patient was prepped and draped in a sterile fashion.  Time-out was performed and landmarks were identified. An air arthrogram was obtained by injecting 30cc into the hip joint while traction was pulled. This broke the labral seal allowing for distraction of the hip. Care was taken to ensure the least amount of force necessary to allow safe access to the joint of 8-125m  This was checked with fluoroscopy.   Next we placed an anterolateral portal under the assistance of fluoroscopy.  First, fluoroscopy was used to estimate the trajectory and starting point.  A 251m incision with a #11 blade was made and a straight hemostat was used to dilate the portal through  the appropriate tract.  We then placed a 14-gauge hypodermic needle with careful technique to be as close to the femoral head as possible and parallel to the sourcil to ensure no iatrogenic damage to the labrum.  This released the negative pressure environment and the amount of traction was adjusted to maintain the 8-145mof distraction.  A nitinol wire was placed through the needle and flouroscopy was used to ensure it extended to the medial wall of the acetabulum.  The [place] from [place] placed over the wire and the nitinol wire was retracted to just inside the capsule during insertion of the dilator and cannula to minimize the risk of breakage. The arthroscope was placed next and we visualized the anterior triangle.  We then placed the anterior portal under direct visualization using the technique described above.  This was safely placed as well without damage to the labrum or femoral head.  We then switched our arthroscope to the anterior portal to ensure we were not through the labrum - we were safely through the capsule only.  We then proceeded with a transverse capsulotomy connecting the 2 portals in the same plane utilizing the Samurai blade from [place] The Injector device from [place] used to place traction stitches each in the medial and lateral arms of the proximal capsule.  A Kelly clamp was used to hold the suture against the skin to apply traction. This allowed access to the acetabular rim and labrum as well as protection of the native edges of the capsule.  We identified the anterior inferior iliac spine proximally, the psoas tendon medially and the rectus tendon laterally as landmarks.  We then proceeded with a diagnostic arthroscopy - the results can be found in the findings section above.  We then used the 50 degree hip specific radiofrequency device and a 51m80mhaver to clear the superior acetabulum and expose the subspinous region.  Next we exposed the  acetabular rim leaving the chondrolabral junction intact.  Working from both portals, the acetabular rim/subspinous region was reshaped with a 4.0mm31mamond burr consistent with the preoperative three-dimensional imaging.  This involved significant resection from the 11-2 o'clock regions.  When adequate reshaping was confirmed with fluoroscopy, we then proceeded with the labral repair.  A distal anterolateral portal was placed under direct visualization and the Transport cannula was inserted.  Care was taken to ensure the cannula was in the intermuscular plane between the gluteus minimus and iliocapsularis.  This portal was approximately 4cm distal and 1cm anterior to the anterolateral portal.      We placed 3 anchors at the 12:30, 1:30, and 2:30 positions with a circumferential stitch at each of the above positions. The sutures were passed using the crescent Nanopass from [employer].  Labral tear was severely degenerated from the 11-12 o'clock position so labral debridement was performed in this region as labrum could not could be repaired.  The loose cartilage at the rim and residual degenerative labral tissue was debrided with an oscillating shaver.  Traction was let down with total traction time of 128 minutes  We then turned our attention to the peripheral compartment.  We flexed the hip to 45 degrees. We viewed from the anterior portal and worked from the distal anterolateral portal.  First we passed two traction stitches in the medial and lateral sides of the  capsulotomy.  In between these sutures, we performed a T capsulotomy with the samurai blade from [place] down to the intertrochanteric line in the plane between the iliocapsularis and gluteus minimus. The position of the T-capsulotomy was checked with fluoroscopy to ensure a safe position along the anterolateral neck.  Next we placed one additional traction stitch in the lateral limb of the T-capsulotomy.  Clamps were used to hold these  stitches against the skin for retraction.  Adequate mobilization and retraction was achieved such that we could view the entire CAM lesion. The capsule was retracted with a switching stick through the AL portal when necessary.  We were able to view the medial and lateral synovial folds, identifying the medial and lateral circumflex arteries.  These were protected during the osteoplasty.  A 5.26m burr was used to reshape the femoral neck.  The initial line of resection was defined with the use of dynamic exam and fluoroscopy.  The line was parallel to the acetabular rim with the leg in neutral rotation.  We viewed the base of the femoral neck to provide a foundation for the shape and size of the osteochondroplasty. We were able to adequately remove the cam lesion and reshape the femoral neck.  This was confirmed with fluoroscopy.  Dynamic exam showed no residual impingement flexed to 90 degrees with maximal internal rotation and external rotation.  Finally, we performed a complete capsular closure with Zipline suture from [place] utilizing the [place] from [place] the suture.  A figure of 8 stitch was placed in the T capsulotomy first, and this was tied arthroscopically. Two additional simple stitches were placed at the lateral and medial aspects of the intraportal capsulotomy.  Finally a figure of 8 stitch was placed in the middle of the intraportal capsulotomy with 1 suture limb on either side of the T capsulotomy. These were then tied sequentially with alternating half hitches through an 8.213mTransport cannula. Reapproximation of the capsule was confirmed.   We then removed the arthroscope and closed the incisions with 2-0 Vicryl subdermally and 3-0 nylon stitches.  Local anesthetic was injected about the portals and tracts down to the joint. A sterile dressing was applied and hip brace was applied.  The patient was awakened from anesthesia and transferred to PACU in stable  condition.  Of note, all extremities and available joints were mobilized approximately every 1-2 hours during this surgery to avoid complications from prolonged immobilization.   Of note, assistance from a PA was essential to performing the surgery. PA assisted with patient positioning, retraction, and instrumentation. The surgery would have been more difficult and had longer operative time without PA assistance.   POSTOPERATIVE PLAN: The patient will be admitted overnight for observation.  Flatfoot weightbearing with hip abduction brace x3 weeks.  PT/OT in a.m.  Perioperative antibiotics x24 hours.  Patient will follow-up as an outpatient in 2 weeks.  Outpatient physical therapy to start in 3-4 days.

## 2022-08-19 NOTE — Transfer of Care (Signed)
Immediate Anesthesia Transfer of Care Note  Patient: Kathleen Carr  Procedure(s) Performed: Right hip arthroscopy, acetabuloplasty, labral repair, femoral osteochondroplasty, capsular closure (Right: Hip)  Patient Location: PACU  Anesthesia Type:General  Level of Consciousness: awake and alert   Airway & Oxygen Therapy: Patient Spontanous Breathing and Patient connected to nasal cannula oxygen  Post-op Assessment: Report given to RN and Post -op Vital signs reviewed and stable  Post vital signs: Reviewed and stable  Last Vitals:  Vitals Value Taken Time  BP 105/61 08/19/22 1246  Temp    Pulse 86 08/19/22 1255  Resp 11 08/19/22 1255  SpO2 93 % 08/19/22 1255  Vitals shown include unvalidated device data.  Last Pain:  Vitals:   08/19/22 0628  TempSrc: Temporal  PainSc: 4          Complications: No notable events documented.

## 2022-08-20 ENCOUNTER — Other Ambulatory Visit: Payer: Self-pay

## 2022-08-20 ENCOUNTER — Encounter: Payer: Self-pay | Admitting: Orthopedic Surgery

## 2022-08-20 DIAGNOSIS — M24851 Other specific joint derangements of right hip, not elsewhere classified: Secondary | ICD-10-CM | POA: Diagnosis not present

## 2022-08-20 MED ORDER — OXYCODONE HCL 5 MG PO TABS: 5.0000 mg | ORAL_TABLET | ORAL | 0 refills | Status: DC | PRN

## 2022-08-20 MED ORDER — METFORMIN HCL 500 MG PO TABS
ORAL_TABLET | ORAL | Status: AC
  Administered 2022-08-20: 1000 mg via ORAL
  Filled 2022-08-20: qty 2

## 2022-08-20 MED ORDER — ACETAMINOPHEN 500 MG PO TABS
ORAL_TABLET | ORAL | Status: AC
  Administered 2022-08-20: 1000 mg via ORAL
  Filled 2022-08-20: qty 2

## 2022-08-20 MED ORDER — ONDANSETRON HCL 4 MG PO TABS
4.0000 mg | ORAL_TABLET | Freq: Four times a day (QID) | ORAL | 0 refills | Status: DC | PRN
  Filled 2022-08-20: qty 20, 5d supply, fill #0

## 2022-08-20 MED ORDER — METHOCARBAMOL 500 MG PO TABS
ORAL_TABLET | ORAL | Status: AC
  Administered 2022-08-20: 500 mg via ORAL
  Filled 2022-08-20: qty 1

## 2022-08-20 MED ORDER — TRAMADOL HCL 50 MG PO TABS
50.0000 mg | ORAL_TABLET | Freq: Two times a day (BID) | ORAL | 1 refills | Status: AC | PRN
  Filled 2022-08-20: qty 60, 30d supply, fill #0
  Filled 2022-09-20: qty 60, 30d supply, fill #1

## 2022-08-20 MED ORDER — OXYCODONE HCL 5 MG PO TABS
ORAL_TABLET | ORAL | 0 refills | Status: DC
  Filled 2022-08-20: qty 30, 2d supply, fill #0

## 2022-08-20 MED ORDER — OXYCODONE HCL 5 MG PO TABS
ORAL_TABLET | ORAL | Status: AC
  Administered 2022-08-20: 5 mg
  Filled 2022-08-20: qty 1

## 2022-08-20 MED ORDER — OXYCODONE HCL 5 MG PO TABS
ORAL_TABLET | ORAL | Status: AC
  Administered 2022-08-20: 5 mg via ORAL
  Filled 2022-08-20: qty 1

## 2022-08-20 MED ORDER — METHOCARBAMOL 500 MG PO TABS
500.0000 mg | ORAL_TABLET | Freq: Four times a day (QID) | ORAL | 0 refills | Status: DC | PRN
  Filled 2022-08-20: qty 30, 8d supply, fill #0

## 2022-08-20 MED ORDER — KETOROLAC TROMETHAMINE 15 MG/ML IJ SOLN
INTRAMUSCULAR | Status: AC
  Administered 2022-08-20: 15 mg via INTRAVENOUS
  Filled 2022-08-20: qty 1

## 2022-08-20 MED ORDER — METFORMIN HCL 500 MG PO TABS: 1000.0000 mg | ORAL_TABLET | Freq: Two times a day (BID) | ORAL | Status: DC

## 2022-08-20 MED ORDER — ASPIRIN 325 MG PO TBEC: 325.0000 mg | DELAYED_RELEASE_TABLET | Freq: Every day | ORAL | Status: AC

## 2022-08-20 NOTE — Plan of Care (Signed)
Pt ready for discharge

## 2022-08-20 NOTE — Progress Notes (Signed)
  Subjective: 1 Day Post-Op Procedure(s) (LRB): Right hip arthroscopy, acetabuloplasty, labral repair, femoral osteochondroplasty, capsular closure (Right) Patient reports pain as moderate.   Patient is well, and has had no acute complaints or problems Plan is to go Home after hospital stay. Negative for chest pain and shortness of breath Fever: no Gastrointestinal: Negative for nausea and vomiting  Objective: Vital signs in last 24 hours: Temp:  [97.7 F (36.5 C)-98.9 F (37.2 C)] 98.9 F (37.2 C) (10/25 0549) Pulse Rate:  [72-88] 76 (10/25 0549) Resp:  [10-23] 16 (10/25 0549) BP: (95-121)/(54-76) 112/60 (10/25 0549) SpO2:  [90 %-100 %] 98 % (10/25 0549)  Intake/Output from previous day:  Intake/Output Summary (Last 24 hours) at 08/20/2022 0710 Last data filed at 08/20/2022 0551 Gross per 24 hour  Intake 2506.33 ml  Output 1575 ml  Net 931.33 ml    Intake/Output this shift: No intake/output data recorded.  Labs: No results for input(s): "HGB" in the last 72 hours. No results for input(s): "WBC", "RBC", "HCT", "PLT" in the last 72 hours. No results for input(s): "NA", "K", "CL", "CO2", "BUN", "CREATININE", "GLUCOSE", "CALCIUM" in the last 72 hours. No results for input(s): "LABPT", "INR" in the last 72 hours.   EXAM General - Patient is Alert and Oriented Extremity - Neurovascular intact Sensation intact distally Dorsiflexion/Plantar flexion intact Compartment soft Dressing/Incision - clean, dry, no drainage, with the abduction brace and the polar care in place Motor Function - intact, moving foot and toes well on exam.   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   Weight as of this encounter: 74.8 kg. Advance diet Up with therapy D/C IV fluids  Discharge planning.  Send home with outpatient physical therapy to begin in the next 3 to 4 days.  DVT Prophylaxis - Aspirin Flatfoot weightbearing with hip abduction brace to right leg  Reche Dixon, PA-C Orthopaedic Surgery 08/20/2022, 7:10 AM

## 2022-08-20 NOTE — Discharge Summary (Signed)
Physician Discharge Summary  Subjective: 1 Day Post-Op Procedure(s) (LRB): Right hip arthroscopy, acetabuloplasty, labral repair, femoral osteochondroplasty, capsular closure (Right) Patient reports pain as moderate.   Patient seen in rounds with Dr. Posey Pronto. Patient is well, and has had no acute complaints or problems Patient is ready to go home after physical therapy  Physician Discharge Summary  Patient ID: Kathleen Carr MRN: [id number] DOB/AGE: 1971/09/01 51 y.o.  Admit date: 08/19/2022 Discharge date: 08/20/2022  Admission Diagnoses:  Discharge Diagnoses:  Principal Problem:   Femoroacetabular impingement of right hip   Discharged Condition: fair  Hospital Course: The patient is postop day 1 from a right hip arthroscopy.  She is still working on pain management.  She is doing well with her hip abduction brace.  She is ready to do physical therapy and then ready to go home today.  Her vitals have remained stable.  Treatments: surgery:  1. Right femoroacetabular impingement 2. Right hip labral tear 3. Right hip early degenerative changes   PROCEDURES:  1. Right hip arthroscopy with acetabuloplasty, labral repair, femoral osteochondroplasty, and capsular closure   SURGEON: Cato Mulligan, MD   ASSISTANT: Reche Dixon, PA   ANESTHESIA: Gen   ESTIMATED BLOOD LOSS: minimal   TOTAL IV FLUIDS: per anesthesia  Discharge Exam: Blood pressure 112/60, pulse 76, temperature 98.9 F (37.2 C), temperature source Temporal, resp. rate 16, height '5\' 4"'$  (1.626 m), weight 74.8 kg, SpO2 98 %.   Disposition: Discharge disposition: 01-Home or Self Care        Allergies as of 08/20/2022       Reactions   Dust Mite Mixed Allergen Ext [mite (d. Farinae)]    Respiratory distresss   Other Hives   Allergy to H[place]ory, walnut and Birch trees and all grasses and allergic to Rabbits- patient reports anaphylactic    Sulfa Antibiotics Hives   Latex Itching   Misc. Sulfonamide  Containing Compounds Hives        Medication List     TAKE these medications    albuterol 1.25 MG/3ML nebulizer solution Commonly known as: ACCUNEB Take 1 ampule by nebulization every 6 (six) hours as needed for wheezing.   ALPRAZolam 0.25 MG tablet Commonly known as: XANAX Take 1 to 2 tablets by mouth 2 (two) times daily as needed for anxiety (will cause drowsiness.) (Take 1-2 tablets (0.25-0.5 mg total) by mouth 2 (two) times daily as needed for anxiety (will cause drowsiness.).)   aspirin EC 325 MG tablet Take 1 tablet (325 mg total) by mouth daily. What changed:  medication strength how much to take additional instructions   cholecalciferol 25 MCG (1000 UNIT) tablet Commonly known as: VITAMIN D3 PER PATIENT ONLY TAKES 1 BY MOUTH DURING THE WINTER MONTHS   Colace 100 MG capsule Generic drug: docusate sodium Take 1 capsule by mouth daily.   cyclobenzaprine 10 MG tablet Commonly known as: FLEXERIL Take 1 tablet (10 mg total) by mouth 3 (three) times daily as needed for muscle spasms (will cause drowsiness.).   levothyroxine 50 MCG tablet Commonly known as: SYNTHROID TAKE 1 TABLET BY MOUTH DAILY BEFORE BREAKFAST.   loratadine 10 MG tablet Commonly known as: CLARITIN Take 10 mg by mouth daily.   metFORMIN 1000 MG tablet Commonly known as: GLUCOPHAGE Take 1 tablet (1,000 mg total) by mouth 2 (two) times daily with a meal.   methocarbamol 500 MG tablet Commonly known as: ROBAXIN Take 1 tablet (500 mg total) by mouth every 6 (six) hours as needed for  muscle spasms.   ondansetron 4 MG tablet Commonly known as: ZOFRAN Take 1 tablet (4 mg total) by mouth every 6 (six) hours as needed for nausea.   oxybutynin 15 MG 24 hr tablet Commonly known as: DITROPAN XL Take 1 tablet (15 mg total) by mouth at bedtime.   oxyCODONE 5 MG immediate release tablet Commonly known as: Oxy IR/ROXICODONE Take 1-2 tablets (5-10 mg total) by mouth every 4 (four) hours as needed for  moderate pain (pain score 4-6).   pantoprazole 40 MG tablet Commonly known as: PROTONIX Take 40 mg by mouth daily.   QUEtiapine 300 MG tablet Commonly known as: SEROQUEL Take 1 tablet (300 mg total) by mouth at bedtime.   sertraline 100 MG tablet Commonly known as: ZOLOFT TAKE 1 TABLET BY MOUTH DAILY   traMADol 50 MG tablet Commonly known as: ULTRAM Take 1 tablet (50 mg total) by mouth 2 (two) times daily as needed.   zolpidem 5 MG tablet Commonly known as: AMBIEN TAKE 1 TABLET (5 MG TOTAL) BY MOUTH AT BEDTIME AS NEEDED FOR SLEEP.        Follow-up Information     Leim Fabry, MD. Go in 2 week(s).   Specialty: Orthopedic Surgery Contact information: [place] [address] [phone]                 Signed: Prescott Parma, Nivaan Dicenzo 08/20/2022, 7:17 AM   Objective: Vital signs in last 24 hours: Temp:  [97.7 F (36.5 C)-98.9 F (37.2 C)] 98.9 F (37.2 C) (10/25 0549) Pulse Rate:  [72-88] 76 (10/25 0549) Resp:  [10-23] 16 (10/25 0549) BP: (95-121)/(54-76) 112/60 (10/25 0549) SpO2:  [90 %-100 %] 98 % (10/25 0549)  Intake/Output from previous day:  Intake/Output Summary (Last 24 hours) at 08/20/2022 0717 Last data filed at 08/20/2022 0551 Gross per 24 hour  Intake 2506.33 ml  Output 1575 ml  Net 931.33 ml    Intake/Output this shift: No intake/output data recorded.  Labs: No results for input(s): "HGB" in the last 72 hours. No results for input(s): "WBC", "RBC", "HCT", "PLT" in the last 72 hours. No results for input(s): "NA", "K", "CL", "CO2", "BUN", "CREATININE", "GLUCOSE", "CALCIUM" in the last 72 hours. No results for input(s): "LABPT", "INR" in the last 72 hours.  EXAM: General - Patient is Alert and Oriented Extremity - Neurovascular intact Sensation intact distally Dorsiflexion/Plantar flexion intact Compartment soft Incision - clean, dry, no drainage, with abduction brace and a Polar Care intact. Motor Function  -plantarflexion and dorsiflexion are intact.  Assessment/Plan: 1 Day Post-Op Procedure(s) (LRB): Right hip arthroscopy, acetabuloplasty, labral repair, femoral osteochondroplasty, capsular closure (Right) Procedure(s) (LRB): Right hip arthroscopy, acetabuloplasty, labral repair, femoral osteochondroplasty, capsular closure (Right) Past Medical History:  Diagnosis Date   Allergy    Anxiety    Arthritis    Asthma    Bipolar depression ([place])    Carpal tunnel syndrome    Cat bite 01/14/2022   Depression    Diabetes mellitus without complication (HCC)    GERD (gastroesophageal reflux disease)    Hypertension    Hypothyroidism    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   PTSD (post-traumatic stress disorder)    PTSD (post-traumatic stress disorder)    Smoker    Thyroid disease    Principal Problem:   Femoroacetabular impingement of right hip  Estimated body mass index is 28.32 kg/m as calculated from the following:   Height as of this encounter: '5\' 4"'$  (1.626 m).   Weight as of  this encounter: 74.8 kg. Advance diet Up with therapy D/C IV fluids Diet - Regular diet Follow up - in 2 weeks Activity -flatfoot weightbearing on the right with her abduction brace at all times for 3 weeks Disposition - Home Condition Upon Discharge - Stable DVT Prophylaxis - Aspirin  Reche Dixon, PA-C Orthopaedic Surgery 08/20/2022, 7:17 AM

## 2022-08-20 NOTE — Discharge Instructions (Signed)
Hip Arthroscopy Post-Operative Instructions  1. Physical Therapy should start within 3-4 days of surgery. If your therapist has ANY questions, please ask them to call our offices. 2. If oozing from surgery site occurs, and the dressing appears soaked with bloody fluid please change the dressing as needed. This normally occurs after fluid irrigation during surgery, and will resolve within 24-36 hours. 3. Icing is very important for the first 5-7 days postoperative, and ice is applied (ice packs or ice therapy) as often as possible or at least for 20-minute periods 3-4 times per day. Ice should not be applied directly on the skin. 4. Physical therapist will remove dressing at 1st PT visit. 5. Apply Band-Aids to wound sites and change them once a day. Keep the wound clean and dry. 6. Please do not use bacitracin or other ointments under the bandage. 7. Showering is allowed on post-op day #4 if the wound is dry. MAKE SURE EACH INCISION IS COVERED WITH A WATERPROOF BANDAID DURING SHOWER ONLY! 8. Do not soak the hip in water in a bathtub or pool until the sutures are removed. Typically getting into a bath or pool is permitted 4 weeks after surgery.  9. Driving is permitted after 1 week for L hip surgery only if the narcotic pain medication is no longer being taken and you feel comfortable getting into and out of a car. For R hip surgery, driving is permitted after 2 weeks. Driving a manual car may take up to 3-4 weeks. 10. Please ensure you have a follow-up appointment for suture removal ~2 weeks after surgery.  11. The anesthetic drugs used during your surgery may cause nausea for the first 24 hours. If nausea is encountered, drink only clear liquids (i.e. Sprite or 7-up). The only solids should be dry crackers or toast. If nausea and vomiting become severe or the patient shows sign of dehydration (lack of urination) please call the doctor or the surgicenter. 12. If you develop a fever (101.5), redness, or  yellow/brown/green drainage from the surgical incision site, please call our office to arrange for an evaluation.  13: POST-OPERATIVE PRESCRIPTIONS:  HETERTOPIC BONE PROPHYLAXIS FOR 30 DAYS: 1. EC-Naprosyn 500mg, 1 tablet by mouth two times per day x 30 days 2. Prilosec (Stomach Prophylaxis) 20mg, 1 tablet by mouth daily (take on an empty stomach x 30 days  DVT PROPHYLAXIS 3. Aspirin 325mg by mouth daily x 2 weeks  PAIN MEDICATION:  4. Oxycodone 1 to 3 tablets by mouth every 4 hours as needed 5. Tylenol 1000mg three times day for at least 5 days, then as needed to reduce narcotics  ANTI-NAUSEA (if applicable):  6. Zofran 4mg tablet, 1 tablet every 6 hours as needed. You will be given a prescription, but it is optional to fill it.  ANTI-SPASM (if applicable):  7. Zanaflex 4mg, 2 tablets by mouth every 6 hours as needed.  ANTI-CONSTIPATION 8. Colace 100mg, 2 tablets by mouth daily (to prevent constipation with use of narcotic medications)  14. You will take as aspirin (325 mg) daily x 2 weeks. This may lower the risk of a blood clot developing after surgery. Should severe calf pain occur or significant swelling of calf and ankle, please call our offices. 15. Local anesthetics (i.e. Novocaine) are put into the incision after surgery. It is not uncommon for patients to encounter more pain on the first or second day after surgery. This is the time when swelling peaks. Taking pain medication before bedtime will assist in sleeping. It   is important not to drink or drive while taking narcotic medication. You should resume your normal medications for other conditions the day after surgery. 16. Follow weight bearing instructions as advised at discharge. Crutches may be necessary to assist walking. Extremity elevation for the first 72 hours is also encouraged to minimize swelling. 17. If unexpected problems occur and you need to speak to the doctor, call the office.   Important Contact  Information Roselia Fuentes (Staff Assistant): [phone] Fax Number: [phone]    

## 2022-08-20 NOTE — Progress Notes (Signed)
Pt IV discontinued. Pt discharge instructions provided. Pt and family acknowledged instructions with no questions at this time. Pt was taken to car via wheelchair.

## 2022-08-20 NOTE — Evaluation (Signed)
Physical Therapy Evaluation Patient Details Name: Kathleen Carr MRN: [id number] DOB: 02-May-1971 Today's Date: 08/20/2022  History of Present Illness  51 y/o female s/p Right hip arthroscopy, acetabuloplasty, labral repair, femoral osteochondroplasty, capsular closure 10/24.  Clinical Impression  Pt did well with PT exam and showed ability to confidently manage in-home distances w/ good awareness of R WBing limitations and safe and stable with the effort.  She was also able to negotiate up/down steps appropriate with WBing considerations and did not need any direct assist with mobility, etc.  Pt will have 24/7 assist available at d/c, reports she is starting outpt PT early next week.         Recommendations for follow up therapy are one component of a multi-disciplinary discharge planning process, led by the attending physician.  Recommendations may be updated based on patient status, additional functional criteria and insurance authorization.  Follow Up Recommendations Follow physician's recommendations for discharge plan and follow up therapies      Assistance Recommended at Discharge Intermittent Supervision/Assistance  Patient can return home with the following  A little help with walking and/or transfers;A little help with bathing/dressing/bathroom;Assistance with cooking/housework;Assist for transportation;Help with stairs or ramp for entrance    Equipment Recommendations None recommended by PT  Recommendations for Other Services       Functional Status Assessment Patient has had a recent decline in their functional status and demonstrates the ability to make significant improvements in function in a reasonable and predictable amount of time.     Precautions / Restrictions Precautions Required Braces or Orthoses:  (hip Abd brace) Restrictions Weight Bearing Restrictions: Yes RLE Weight Bearing: Touchdown weight bearing      Mobility  Bed Mobility                General bed mobility comments: In recliner on arrival, reports managing with walker but not needing assist.    Transfers Overall transfer level: Modified independent Equipment used: Rolling walker (2 wheels)               General transfer comment: able to attain standing while maintaining appropriate R WBing, cuing for b/l UE use and general precautions    Ambulation/Gait Ambulation/Gait assistance: Min guard Gait Distance (Feet): 150 Feet Assistive device: Rolling walker (2 wheels)         General Gait Details: Pt was able to ambulate with appropriate WBing considerations, walker use and maintained stable vitals t/o the effort.  She showed ability to safely manage at home with FWW (has 4WW at home, discussed progressing with PT to determine when/if to transitiong to LRAD)  Stairs Stairs: Yes Stairs assistance: Min guard Stair Management: Backwards, With walker, No rails Number of Stairs: 6 General stair comments: Pt was able to negotiate up/down steps with FWW, able to maintain TTWBing on R with close cuing but no need for direct assist (apart from AD management)  Wheelchair Mobility    Modified Rankin (Stroke Patients Only)       Balance Overall balance assessment: Modified Independent (appropriate reliance on walker w/o LOBs or safety concerns)                                           Pertinent Vitals/Pain Pain Assessment Pain Assessment: 0-10 Pain Score: 3  Pain Location: R hip    Home Living Family/patient expects to be discharged to:: Private residence  Living Arrangements: Spouse/significant other Available Help at Discharge: Family Type of Home: House Home Access: Stairs to enter Entrance Stairs-Rails: Left Entrance Stairs-Number of Steps: 3   Home Layout: One level Home Equipment: Rollator (4 wheels);Rolling Walker (2 wheels)      Prior Function Prior Level of Function : Independent/Modified Independent                      Hand Dominance        Extremity/Trunk Assessment   Upper Extremity Assessment Upper Extremity Assessment: Overall WFL for tasks assessed    Lower Extremity Assessment Lower Extremity Assessment: Overall WFL for tasks assessed       Communication   Communication: No difficulties  Cognition Arousal/Alertness: Awake/alert Behavior During Therapy: WFL for tasks assessed/performed                                            General Comments General comments (skin integrity, edema, etc.): Pt with good understanding of WBing limitations    Exercises     Assessment/Plan    PT Assessment Patient needs continued PT services  PT Problem List Decreased strength;Decreased range of motion;Decreased activity tolerance;Decreased balance;Decreased mobility;Decreased safety awareness;Decreased knowledge of use of DME;Pain       PT Treatment Interventions DME instruction;Gait training;Stair training;Functional mobility training;Therapeutic activities;Therapeutic exercise;Balance training;Neuromuscular re-education;Patient/family education    PT Goals (Current goals can be found in the Care Plan section)  Acute Rehab PT Goals Patient Stated Goal: Go home PT Goal Formulation: With patient Time For Goal Achievement: 09/02/22 Potential to Achieve Goals: Good    Frequency 7X/week     Co-evaluation               AM-PAC PT "6 Clicks" Mobility  Outcome Measure Help needed turning from your back to your side while in a flat bed without using bedrails?: None Help needed moving from lying on your back to sitting on the side of a flat bed without using bedrails?: None Help needed moving to and from a bed to a chair (including a wheelchair)?: None Help needed standing up from a chair using your arms (e.g., wheelchair or bedside chair)?: None Help needed to walk in hospital room?: A Little Help needed climbing 3-5 steps with a railing? : A Little 6 Click  Score: 22    End of Session   Activity Tolerance: Patient tolerated treatment well Patient left: with call bell/phone within reach;in chair;with family/visitor present Nurse Communication: Mobility status PT Visit Diagnosis: Muscle weakness (generalized) (M62.81);Pain    Time: 8657-8469 PT Time Calculation (min) (ACUTE ONLY): 34 min   Charges:   PT Evaluation $PT Eval Low Complexity: 1 Low PT Treatments $Gait Training: 8-22 mins        Kreg Shropshire, DPT 08/20/2022, 10:55 AM

## 2022-08-20 NOTE — Evaluation (Signed)
Occupational Therapy Evaluation Patient Details Name: Kathleen Carr MRN: [id number] DOB: Jul 05, 1971 Today's Date: 08/20/2022   History of Present Illness 51 y/o female s/p Right hip arthroscopy, acetabuloplasty, labral repair, femoral osteochondroplasty, capsular closure 10/24.   Clinical Impression   Patient presenting with decreased independence in self-care, functional mobility, safety, and strength.  Patient reports she lives with her wife who will be able to provide assistance at discharge. Patient has a shower seat, RW, and BSC. Patient reports, at baseline she is independent. Patient currently functioning at min guard/ supervision for sit >supine for bed mobility and min A for supine> sit. Patient required max A to donn and doff the abduction brace supine for dressing tasks to maintain hip precautions. Max A for LB dressing and set up for UB dressing. Mod I for transfers using RW. Patients wife was in room and was educated on dressing tasks and the brace, and she was able to return demonstration. Patient was educated on precautions, brace wearing schedule and application, and the polar care system. Patient was able to verbalize understanding. Patient left in recliner with call bell in reach, wife in room, an all needs met. No OT services recommended at this time. OT to complete order.      Recommendations for follow up therapy are one component of a multi-disciplinary discharge planning process, led by the attending physician.  Recommendations may be updated based on patient status, additional functional criteria and insurance authorization.   Follow Up Recommendations  No OT follow up    Assistance Recommended at Discharge Intermittent Supervision/Assistance  Patient can return home with the following A little help with walking and/or transfers;A little help with bathing/dressing/bathroom;Assist for transportation;Help with stairs or ramp for entrance    Functional Status Assessment   Patient has had a recent decline in their functional status and demonstrates the ability to make significant improvements in function in a reasonable and predictable amount of time.  Equipment Recommendations  None recommended by OT       Precautions / Restrictions Precautions Precautions: Fall Required Braces or Orthoses:  (hip Abd brace) Restrictions Weight Bearing Restrictions: Yes RLE Weight Bearing: Touchdown weight bearing      Mobility Bed Mobility Overal bed mobility: Needs Assistance Bed Mobility: Supine to Sit, Sit to Supine     Supine to sit: Min guard, Supervision Sit to supine: Min assist        Transfers Overall transfer level: Modified independent Equipment used: Rolling walker (2 wheels)               General transfer comment: able to attain standing while maintaining appropriate R WBing.      Balance Overall balance assessment: Modified Independent                                         ADL either performed or assessed with clinical judgement   ADL Overall ADL's : Needs assistance/impaired                 Upper Body Dressing : Set up;Sitting   Lower Body Dressing: Moderate assistance Lower Body Dressing Details (indicate cue type and reason): Due to precautions; significant other educated and return demonstartion                     Vision Patient Visual Report: No change from baseline  Pertinent Vitals/Pain Pain Assessment Pain Assessment: No/denies pain        Extremity/Trunk Assessment Upper Extremity Assessment Upper Extremity Assessment: Overall WFL for tasks assessed   Lower Extremity Assessment Lower Extremity Assessment: Overall WFL for tasks assessed       Communication Communication Communication: No difficulties   Cognition Arousal/Alertness: Awake/alert Behavior During Therapy: WFL for tasks assessed/performed Overall Cognitive Status: Within Functional Limits for  tasks assessed                                       General Comments  Pt with good understanding of WBing limitations            Home Living Family/patient expects to be discharged to:: Private residence Living Arrangements: Spouse/significant other Available Help at Discharge: Family;Available 24 hours/day Type of Home: House Home Access: Stairs to enter [employer] of Steps: 3 Entrance Stairs-Rails: Left Home Layout: One level         Bathroom Toilet: Standard     Home Equipment: Rollator (4 wheels);Rolling Walker (2 wheels);BSC/3in1;Shower seat          Prior Functioning/Environment Prior Level of Function : Independent/Modified Independent                                 OT Goals(Current goals can be found in the care plan section) Acute Rehab OT Goals Patient Stated Goal: to return home. OT Goal Formulation: With patient/family Time For Goal Achievement: 08/20/22 Potential to Achieve Goals: Good   AM-PAC OT "6 Clicks" Daily Activity     Outcome Measure Help from another person eating meals?: None Help from another person taking care of personal grooming?: A Little Help from another person toileting, which includes using toliet, bedpan, or urinal?: A Little Help from another person bathing (including washing, rinsing, drying)?: A Little Help from another person to put on and taking off regular upper body clothing?: None Help from another person to put on and taking off regular lower body clothing?: A Lot 6 Click Score: 19   End of Session Equipment Utilized During Treatment: Rolling walker (2 wheels);Other (comment) (Abduction brace) Nurse Communication: Mobility status  Activity Tolerance: Patient tolerated treatment well Patient left: in chair;with call bell/phone within reach;with family/visitor present                   Time: 1037-1100 OT Time Calculation (min): 23 min Charges:  OT General Charges $OT Visit:  1 Visit OT Evaluation $OT Eval Moderate Complexity: 1 Mod    [place], OTS 08/20/2022, 11:15 AM

## 2022-08-25 ENCOUNTER — Other Ambulatory Visit: Payer: Self-pay | Admitting: Internal Medicine

## 2022-08-25 DIAGNOSIS — Z9889 Other specified postprocedural states: Secondary | ICD-10-CM | POA: Diagnosis not present

## 2022-08-25 DIAGNOSIS — R062 Wheezing: Secondary | ICD-10-CM

## 2022-08-25 DIAGNOSIS — M25651 Stiffness of right hip, not elsewhere classified: Secondary | ICD-10-CM | POA: Diagnosis not present

## 2022-08-25 DIAGNOSIS — M6281 Muscle weakness (generalized): Secondary | ICD-10-CM | POA: Diagnosis not present

## 2022-08-25 DIAGNOSIS — M25551 Pain in right hip: Secondary | ICD-10-CM | POA: Diagnosis not present

## 2022-08-25 MED FILL — Zolpidem Tartrate Tab 5 MG: ORAL | 15 days supply | Qty: 15 | Fill #3 | Status: AC

## 2022-08-26 ENCOUNTER — Other Ambulatory Visit: Payer: Self-pay

## 2022-08-26 ENCOUNTER — Other Ambulatory Visit: Payer: Self-pay | Admitting: Internal Medicine

## 2022-08-26 DIAGNOSIS — R062 Wheezing: Secondary | ICD-10-CM

## 2022-08-26 MED ORDER — PANTOPRAZOLE SODIUM 40 MG PO TBEC
40.0000 mg | DELAYED_RELEASE_TABLET | Freq: Every day | ORAL | 1 refills | Status: DC
  Filled 2022-08-26: qty 90, 90d supply, fill #0

## 2022-08-26 MED FILL — Cyclobenzaprine HCl Tab 10 MG: ORAL | 1 days supply | Qty: 2 | Fill #0 | Status: AC

## 2022-08-28 ENCOUNTER — Other Ambulatory Visit: Payer: Self-pay

## 2022-08-28 DIAGNOSIS — M25551 Pain in right hip: Secondary | ICD-10-CM | POA: Diagnosis not present

## 2022-08-28 DIAGNOSIS — M25651 Stiffness of right hip, not elsewhere classified: Secondary | ICD-10-CM | POA: Diagnosis not present

## 2022-08-28 DIAGNOSIS — Z9889 Other specified postprocedural states: Secondary | ICD-10-CM | POA: Diagnosis not present

## 2022-08-28 DIAGNOSIS — M6281 Muscle weakness (generalized): Secondary | ICD-10-CM | POA: Diagnosis not present

## 2022-09-01 ENCOUNTER — Other Ambulatory Visit: Payer: Self-pay

## 2022-09-01 DIAGNOSIS — M25651 Stiffness of right hip, not elsewhere classified: Secondary | ICD-10-CM | POA: Diagnosis not present

## 2022-09-01 DIAGNOSIS — M25551 Pain in right hip: Secondary | ICD-10-CM | POA: Diagnosis not present

## 2022-09-01 DIAGNOSIS — M6281 Muscle weakness (generalized): Secondary | ICD-10-CM | POA: Diagnosis not present

## 2022-09-01 DIAGNOSIS — Z9889 Other specified postprocedural states: Secondary | ICD-10-CM | POA: Diagnosis not present

## 2022-09-01 MED ORDER — METHOCARBAMOL 500 MG PO TABS
500.0000 mg | ORAL_TABLET | Freq: Four times a day (QID) | ORAL | 0 refills | Status: DC
  Filled 2022-09-01: qty 60, 15d supply, fill #0

## 2022-09-01 MED ORDER — OXYCODONE HCL 5 MG PO TABS
5.0000 mg | ORAL_TABLET | Freq: Two times a day (BID) | ORAL | 0 refills | Status: DC | PRN
  Filled 2022-09-01: qty 20, 10d supply, fill #0

## 2022-09-05 ENCOUNTER — Other Ambulatory Visit: Payer: Self-pay | Admitting: Orthopedic Surgery

## 2022-09-05 ENCOUNTER — Other Ambulatory Visit: Payer: Self-pay

## 2022-09-05 MED FILL — Alprazolam Tab 0.25 MG: ORAL | 30 days supply | Qty: 30 | Fill #2 | Status: AC

## 2022-09-08 ENCOUNTER — Other Ambulatory Visit: Payer: Self-pay

## 2022-09-08 MED ORDER — ONDANSETRON HCL 4 MG PO TABS
4.0000 mg | ORAL_TABLET | Freq: Four times a day (QID) | ORAL | 0 refills | Status: DC | PRN
  Filled 2022-09-08: qty 20, 5d supply, fill #0

## 2022-09-15 ENCOUNTER — Other Ambulatory Visit: Payer: Self-pay

## 2022-09-15 MED FILL — Zolpidem Tartrate Tab 5 MG: ORAL | 15 days supply | Qty: 15 | Fill #4 | Status: AC

## 2022-09-16 DIAGNOSIS — M545 Low back pain, unspecified: Secondary | ICD-10-CM | POA: Diagnosis not present

## 2022-09-17 DIAGNOSIS — M6281 Muscle weakness (generalized): Secondary | ICD-10-CM | POA: Diagnosis not present

## 2022-09-17 DIAGNOSIS — M25651 Stiffness of right hip, not elsewhere classified: Secondary | ICD-10-CM | POA: Diagnosis not present

## 2022-09-17 DIAGNOSIS — M25551 Pain in right hip: Secondary | ICD-10-CM | POA: Diagnosis not present

## 2022-09-17 DIAGNOSIS — Z9889 Other specified postprocedural states: Secondary | ICD-10-CM | POA: Diagnosis not present

## 2022-09-22 ENCOUNTER — Other Ambulatory Visit: Payer: Self-pay

## 2022-09-29 ENCOUNTER — Other Ambulatory Visit: Payer: Self-pay | Admitting: Internal Medicine

## 2022-09-29 MED FILL — Zolpidem Tartrate Tab 5 MG: ORAL | 15 days supply | Qty: 15 | Fill #5 | Status: AC

## 2022-09-30 ENCOUNTER — Other Ambulatory Visit: Payer: Self-pay

## 2022-10-01 ENCOUNTER — Other Ambulatory Visit: Payer: Self-pay

## 2022-10-01 ENCOUNTER — Other Ambulatory Visit: Payer: Self-pay | Admitting: Internal Medicine

## 2022-10-01 DIAGNOSIS — N200 Calculus of kidney: Secondary | ICD-10-CM | POA: Diagnosis not present

## 2022-10-01 DIAGNOSIS — K7689 Other specified diseases of liver: Secondary | ICD-10-CM | POA: Diagnosis not present

## 2022-10-01 DIAGNOSIS — K449 Diaphragmatic hernia without obstruction or gangrene: Secondary | ICD-10-CM | POA: Diagnosis not present

## 2022-10-01 DIAGNOSIS — R350 Frequency of micturition: Secondary | ICD-10-CM | POA: Diagnosis not present

## 2022-10-01 MED FILL — Oxybutynin Chloride Tab ER 24HR 15 MG: ORAL | 30 days supply | Qty: 30 | Fill #0 | Status: AC

## 2022-10-05 ENCOUNTER — Other Ambulatory Visit: Payer: Self-pay | Admitting: Family

## 2022-10-05 ENCOUNTER — Other Ambulatory Visit: Payer: Self-pay

## 2022-10-05 MED ORDER — LEVOTHYROXINE SODIUM 50 MCG PO TABS
ORAL_TABLET | Freq: Every day | ORAL | 1 refills | Status: DC
  Filled 2022-10-05: qty 90, 90d supply, fill #0

## 2022-10-05 MED FILL — Alprazolam Tab 0.25 MG: ORAL | 30 days supply | Qty: 30 | Fill #3 | Status: AC

## 2022-10-06 ENCOUNTER — Other Ambulatory Visit: Payer: Self-pay

## 2022-10-06 DIAGNOSIS — M76891 Other specified enthesopathies of right lower limb, excluding foot: Secondary | ICD-10-CM | POA: Diagnosis not present

## 2022-10-08 DIAGNOSIS — M25551 Pain in right hip: Secondary | ICD-10-CM | POA: Diagnosis not present

## 2022-10-08 DIAGNOSIS — M25651 Stiffness of right hip, not elsewhere classified: Secondary | ICD-10-CM | POA: Diagnosis not present

## 2022-10-08 DIAGNOSIS — Z9889 Other specified postprocedural states: Secondary | ICD-10-CM | POA: Diagnosis not present

## 2022-10-08 DIAGNOSIS — M6281 Muscle weakness (generalized): Secondary | ICD-10-CM | POA: Diagnosis not present

## 2022-10-14 ENCOUNTER — Other Ambulatory Visit: Payer: Self-pay

## 2022-10-14 MED FILL — Zolpidem Tartrate Tab 5 MG: ORAL | 15 days supply | Qty: 15 | Fill #6 | Status: AC

## 2022-10-15 DIAGNOSIS — Z9889 Other specified postprocedural states: Secondary | ICD-10-CM | POA: Diagnosis not present

## 2022-10-15 DIAGNOSIS — M25551 Pain in right hip: Secondary | ICD-10-CM | POA: Diagnosis not present

## 2022-10-15 DIAGNOSIS — M25651 Stiffness of right hip, not elsewhere classified: Secondary | ICD-10-CM | POA: Diagnosis not present

## 2022-10-15 DIAGNOSIS — M6281 Muscle weakness (generalized): Secondary | ICD-10-CM | POA: Diagnosis not present

## 2022-10-19 ENCOUNTER — Other Ambulatory Visit: Payer: Self-pay

## 2022-10-21 ENCOUNTER — Other Ambulatory Visit: Payer: Self-pay

## 2022-10-21 MED ORDER — METHOCARBAMOL 500 MG PO TABS
ORAL_TABLET | ORAL | 0 refills | Status: DC
  Filled 2022-10-21: qty 60, 15d supply, fill #0

## 2022-10-21 MED ORDER — OXYCODONE HCL 5 MG PO TABS
5.0000 mg | ORAL_TABLET | Freq: Two times a day (BID) | ORAL | 0 refills | Status: DC | PRN
  Filled 2022-10-21: qty 20, 10d supply, fill #0

## 2022-10-23 ENCOUNTER — Encounter: Payer: Self-pay | Admitting: Neurosurgery

## 2022-10-23 ENCOUNTER — Other Ambulatory Visit: Payer: Self-pay

## 2022-10-23 ENCOUNTER — Ambulatory Visit (INDEPENDENT_AMBULATORY_CARE_PROVIDER_SITE_OTHER): Payer: 59 | Admitting: Neurosurgery

## 2022-10-23 VITALS — BP 116/72 | Ht 64.0 in | Wt 164.6 lb

## 2022-10-23 DIAGNOSIS — M47816 Spondylosis without myelopathy or radiculopathy, lumbar region: Secondary | ICD-10-CM

## 2022-10-23 DIAGNOSIS — M47819 Spondylosis without myelopathy or radiculopathy, site unspecified: Secondary | ICD-10-CM

## 2022-10-23 NOTE — Progress Notes (Signed)
Referring Physician:  No referring provider defined for this encounter.  Primary Physician:  Crecencio Mc, MD  History of Present Illness: 10/23/2022 Kathleen Carr is a 51 year old presenting today for further evaluation of persistent back pain.  She has since undergone her right hip surgery which has provided significant relief to her right groin pain however despite this she has persistent low back pain that is debilitating and unchanged in nature.  In addition to this she describes some discomfort in her left lateral leg.  She denies any other radiating leg symptoms.  She would like further evaluation and treatment of this.   07/22/22 History of Left L5-S1 lumbar laminectomy in 2021 with Dr. Oren Section.   History of bipolar, DM, GERD, HTN, and PTSD.   Last seen by Darald Uzzle on 03/20/22 for LBP with right groin and anterior thigh pain.   MRI 02/21/22 showed multilevel degenerative changes the lumbar spine, worst at L4-L5 where there is asymmetric left disc bulging and facet arthropathy resulting in severe left-sided neural foraminal stenosis and exiting nerve root impingement. Mild spinal canal and moderate right neural foraminal stenosis at this level.   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 Discussed lumbar ESI and PT, she wanted to hold off on these.   She is here for follow up.   She saw ortho and is scheduled for right hip surgery in October (labral tear and bone spurs per patient).   She has constant right > left sided LBP with some pulling in front right thigh and left posterior thigh. Leg pain is intermittent. Pain is worse with prolonged standing and walking. She has pain with carrying laundry basket. Some relief relief with changing position. No numbness or tingling. No weakness noted. No bowel or bladder issues.   She is on flexeril and ultram. She has minimal improvement with these. No recent lumbar ESIs (had prior to  surgery in 2021). No recent PT for her lower back.   Conservative measures:  Physical therapy: PT in 2022 for a couple of weeks with exacerbating symptoms. Multimodal medical therapy including regular antiinflammatories: Tylenol, ibuprofen, Mobic, tramadol, and Flexeril Injections: Previous facet and SI joint injections in 2022 at [facility]. No recent epidural steroid injections   Past Surgery: Left L5-S1 laminectomy in 2021 with Dr. Oren Section  The symptoms are causing a significant impact on the patient's life.   Review of Systems:  A 10 point review of systems is negative, except for the pertinent positives and negatives detailed in the HPI.  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   PTSD (post-traumatic stress disorder)    PTSD (post-traumatic stress disorder)    Smoker    Thyroid disease     Past Surgical History: Past Surgical History:  Procedure Laterality Date   ABDOMINAL HYSTERECTOMY     complete   BACK SURGERY  2021   lumbar surgery   CARPAL TUNNEL RELEASE Right 03/12/2022   Procedure: RIGHT CARPAL TUNNEL RELEASE;  Surgeon: Sherilyn Cooter, MD;  Location: [place];  Service: Orthopedics;  Laterality:  Right;   CARPAL TUNNEL RELEASE Left 04/16/2022   Procedure: LEFT CARPAL TUNNEL RELEASE;  Surgeon: Sherilyn Cooter, MD;  Location: [place];  Service: Orthopedics;  Laterality: Left;   CHOLECYSTECTOMY     COLONOSCOPY WITH PROPOFOL N/A 02/05/2022   Procedure: COLONOSCOPY WITH PROPOFOL;  Surgeon: [name], MD;  Location: [facility] ENDOSCOPY;  Service: Gastroenterology;  Laterality: N/A;   ESOPHAGOGASTRODUODENOSCOPY N/A  02/05/2022   Procedure: ESOPHAGOGASTRODUODENOSCOPY (EGD);  Surgeon: [name], MD;  Location: [facility] ENDOSCOPY;  Service: Gastroenterology;  Laterality: N/A;   HIP ARTHROSCOPY Right 08/19/2022   Procedure: Right hip arthroscopy, acetabuloplasty, labral repair, femoral osteochondroplasty, capsular closure;  Surgeon: Leim Fabry, MD;  Location: [facility];  Service: Orthopedics;  Laterality: Right;   KNEE ARTHROSCOPY Bilateral    TONSILLECTOMY     as a child    Allergies: Allergies as of 10/23/2022 - Review Complete 10/23/2022  Allergen Reaction Noted   Dust mite mixed allergen ext [mite (d. farinae)]  08/01/2020   Other Hives 08/01/2020   Sulfa antibiotics Hives 06/24/2020   Latex Itching 08/12/2022   Misc. sulfonamide containing compounds Hives 05/05/2022    Medications: Outpatient Encounter Medications as of 10/23/2022  Medication Sig   docusate sodium (COLACE) 100 MG capsule Take 1 capsule by mouth daily.   loratadine (CLARITIN) 10 MG tablet Take 10 mg by mouth daily.   albuterol (ACCUNEB) 1.25 MG/3ML nebulizer solution Take 1 ampule by nebulization every 6 (six) hours as needed for wheezing.   ALPRAZolam (XANAX) 0.25 MG tablet Take 1-2 tablets (0.25-0.5 mg total) by mouth 2 (two) times daily as needed for anxiety (will cause drowsiness.).   cholecalciferol (VITAMIN D3) 25 MCG (1000 UNIT) tablet PER PATIENT ONLY TAKES 1 BY MOUTH DURING THE WINTER MONTHS (Patient not taking: Reported on 08/19/2022)   cyclobenzaprine (FLEXERIL) 10 MG tablet Take 1 tablet (10 mg total) by mouth 3 (three) times daily as needed for muscle spasms (will cause drowsiness.).   levothyroxine (SYNTHROID) 50 MCG tablet TAKE 1 TABLET BY MOUTH DAILY BEFORE BREAKFAST.   metFORMIN (GLUCOPHAGE) 1000 MG tablet Take 1 tablet (1,000 mg total) by mouth 2 (two) times daily with a meal.   methocarbamol (ROBAXIN) 500 MG tablet Take 1 tablet (500 mg total) by mouth 4 (four) times daily.   ondansetron (ZOFRAN) 4 MG tablet Take  1 tablet (4 mg total) by mouth every 6 (six) hours as needed for nausea.   oxybutynin (DITROPAN XL) 15 MG 24 hr tablet Take 1 tablet (15 mg total) by mouth at bedtime.   oxyCODONE (OXY IR/ROXICODONE) 5 MG immediate release tablet Take 1-2 tablets (5-10 mg total) by mouth every 4 (four) hours as needed for moderate pain (pain score 4-6).   pantoprazole (PROTONIX) 40 MG tablet Take 1 tablet (40 mg total) by mouth daily.   QUEtiapine (SEROQUEL) 300 MG tablet Take 1 tablet (300 mg total) by mouth at bedtime.   sertraline (ZOLOFT) 100 MG tablet TAKE 1 TABLET BY MOUTH DAILY   traMADol (ULTRAM) 50 MG tablet Take 1 tablet (50 mg total) by mouth 2 (two) times daily as needed.   zolpidem (AMBIEN) 5 MG tablet TAKE 1 TABLET (5 MG TOTAL) BY MOUTH AT BEDTIME AS NEEDED FOR SLEEP.   [DISCONTINUED] methocarbamol (ROBAXIN) 500 MG tablet Take 1 tablet (500 mg total) by mouth every 6 (six) hours as needed for muscle spasms. (Patient not taking: Reported on 10/23/2022)   [DISCONTINUED] oxyCODONE (OXY IR/ROXICODONE) 5 MG immediate release tablet Take 1-2 tablets (5-10 mg total)  by mouth every 4 (four) hours as needed for moderate pain (pain score 4-6). (Patient not taking: Reported on 10/23/2022)   [DISCONTINUED] oxyCODONE (OXY IR/ROXICODONE) 5 MG immediate release tablet Take 1 tablet (5 mg total) by mouth every 12 (twelve) hours as needed for Pain   [DISCONTINUED] simvastatin (ZOCOR) 40 MG tablet Take 1 tablet (40 mg total) by mouth daily at 6 PM.   [DISCONTINUED] sitaGLIPtin (JANUVIA) 100 MG tablet Take 1 tablet (100 mg total) by mouth daily.   No facility-administered encounter medications on file as of 10/23/2022.    Social History: Social History   Tobacco Use   Smoking status: Some Days    Packs/day: 0.50    Years: 42.00    Total pack years: 21.00    Types: Cigarettes    Last attempt to quit: 05/25/2020    Years since quitting: 2.4   Smokeless tobacco: Former   Tobacco comments:    0.5 PPD 01/28/2022      Smoked since she was 51 years old  Vaping Use   Vaping Use: Former  Substance Use Topics   Alcohol use: Not Currently   Drug use: Not Currently    Family Medical History: Family History  Problem Relation Age of Onset   Hypertension Mother    Thyroid disease Mother    Lung cancer Father 69       carcinoid, right lung   Ulcerative colitis Father    Renal Disease Maternal Grandfather    Diabetes Maternal Grandfather        Type2   Glaucoma Maternal Grandmother    Stroke Paternal Grandfather    Hypertension Paternal Grandfather     Physical Examination: Vitals:   10/23/22 1335  BP: 116/72    General: Patient is well developed, well nourished, calm, collected, and in no apparent distress. Attention to examination is appropriate.  Respiratory: Patient is breathing without any difficulty.   NEUROLOGICAL:     Awake, alert, oriented to person, place, and time.  Speech is clear and fluent. Fund of knowledge is appropriate.   Cranial Nerves: Pupils equal round and reactive to light.  Facial tone is symmetric.  Facial sensation is symmetric.   Strength: Side Biceps Triceps Deltoid Interossei Grip Wrist Ext. Wrist Flex.  R '5 5 5 5 5 5 5  '$ L '5 5 5 5 5 5 5   '$ Side Iliopsoas Quads Hamstring PF DF EHL  R '5 5 5 5 5 5  '$ L '5 5 5 5 5 5   '$ Reflexes are 2+ and symmetric at the biceps, triceps, brachioradialis, patella and achilles.   Hoffman's is absent.  Clonus is not present.   Bilateral upper and lower extremity sensation is intact to light touch.     She has mild pain with ROM of right hip.   Mild right sided lumbar tenderness with point tenderness over right SI joint.   Gait is normal.    Medical Decision Making  Imaging: Nothing new to review.   I have personally reviewed the images and agree with the above interpretation.  Assessment and Plan: Ms. Centner is a pleasant 51 y.o. female with distant low back pain.  Her MRI from April of last year does show significant  facet arthropathy at L4-5 which may be the cause of her symptoms.  We discussed treatment options including injections however she has had a bad experience with these in the past.  She will obtain her injection records from [facility] to see if she has  undergone facet injections.  If she has not undergone these I will discuss her imaging and symptoms with Dr. Cari Caraway to discuss further plan of care as she is interested in surgical options if these are available to her.  She was encouraged to call the office in the interim should she have any questions or concerns.  She expressed understanding was in agreement with this plan.  I spent a total of 27 minutes in both face-to-face and non-face-to-face activities for this visit on the date of this encounter bleeding review of records, review of imaging, review of symptoms, discussion of treatment options, documentation.   Cooper Render PA-C Dept. of Neurosurgery

## 2022-10-25 ENCOUNTER — Other Ambulatory Visit: Payer: Self-pay | Admitting: Orthopedic Surgery

## 2022-10-25 MED FILL — Alprazolam Tab 0.25 MG: ORAL | 30 days supply | Qty: 30 | Fill #4 | Status: CN

## 2022-10-25 MED FILL — Zolpidem Tartrate Tab 5 MG: ORAL | 15 days supply | Qty: 15 | Fill #7 | Status: AC

## 2022-10-26 ENCOUNTER — Other Ambulatory Visit: Payer: Self-pay

## 2022-10-27 ENCOUNTER — Other Ambulatory Visit: Payer: Self-pay

## 2022-10-27 DIAGNOSIS — G459 Transient cerebral ischemic attack, unspecified: Secondary | ICD-10-CM

## 2022-10-27 HISTORY — DX: Transient cerebral ischemic attack, unspecified: G45.9

## 2022-10-29 ENCOUNTER — Other Ambulatory Visit: Payer: Self-pay

## 2022-10-30 DIAGNOSIS — Z9889 Other specified postprocedural states: Secondary | ICD-10-CM | POA: Diagnosis not present

## 2022-10-30 DIAGNOSIS — M25551 Pain in right hip: Secondary | ICD-10-CM | POA: Diagnosis not present

## 2022-10-30 DIAGNOSIS — M25651 Stiffness of right hip, not elsewhere classified: Secondary | ICD-10-CM | POA: Diagnosis not present

## 2022-10-30 DIAGNOSIS — M6281 Muscle weakness (generalized): Secondary | ICD-10-CM | POA: Diagnosis not present

## 2022-10-31 ENCOUNTER — Other Ambulatory Visit: Payer: Self-pay

## 2022-10-31 MED ORDER — TRAMADOL HCL 50 MG PO TABS
50.0000 mg | ORAL_TABLET | Freq: Two times a day (BID) | ORAL | 1 refills | Status: DC | PRN
  Filled 2022-10-31: qty 60, 30d supply, fill #0

## 2022-11-05 ENCOUNTER — Telehealth: Payer: Self-pay

## 2022-11-05 NOTE — Telephone Encounter (Signed)
-----   Message from Peggyann Shoals sent at 11/05/2022 11:36 AM EST ----- Regarding: [facility] Records are on your desk. Per [name]'s note below, she wanted to request [facility] notes. Who should she see next?    "We discussed treatment options including injections however she has had a bad experience with these in the past.  She will obtain her injection records from [facility] to see if she has undergone facet injections.  If she has not undergone these I will discuss her imaging and symptoms with Dr. Cari Caraway to discuss further plan of care as she is interested in surgical options if these are available to her."

## 2022-11-05 NOTE — Telephone Encounter (Signed)
We have gotten her records from [facility].  She did have a Right L4-5 and L5-S1 facet joint injection on 01/09/21.

## 2022-11-07 MED FILL — Alprazolam Tab 0.25 MG: ORAL | 30 days supply | Qty: 30 | Fill #4 | Status: AC

## 2022-11-13 ENCOUNTER — Other Ambulatory Visit: Payer: Self-pay

## 2022-11-13 ENCOUNTER — Ambulatory Visit (INDEPENDENT_AMBULATORY_CARE_PROVIDER_SITE_OTHER): Payer: 59 | Admitting: Neurosurgery

## 2022-11-13 DIAGNOSIS — M47819 Spondylosis without myelopathy or radiculopathy, site unspecified: Secondary | ICD-10-CM

## 2022-11-13 DIAGNOSIS — M47816 Spondylosis without myelopathy or radiculopathy, lumbar region: Secondary | ICD-10-CM

## 2022-11-13 MED FILL — Zolpidem Tartrate Tab 5 MG: ORAL | 15 days supply | Qty: 15 | Fill #8 | Status: AC

## 2022-11-13 NOTE — Progress Notes (Signed)
I spoke with Kathleen Carr today regarding pain back and leg pain.  She has been in physical therapy for her hip but is having increased pain and is not able to participate with this.  She would like to move forward with surgical consideration.  I did encourage her to contact the physical therapist and see if they are willing to formally discharge her from PT given her increased pain and inability to participate.  She will do that today.  I have also reviewed her imaging and symptoms with Dr. Cari Caraway who felt that a discussion of possible surgical intervention was reasonable.  Will get her scheduled to see Dr. Cari Caraway to discuss her options.  She was encouraged to call the office should she have any questions or concerns.  Cooper Render PA-C

## 2022-11-14 DIAGNOSIS — M25451 Effusion, right hip: Secondary | ICD-10-CM | POA: Diagnosis not present

## 2022-11-14 DIAGNOSIS — M659 Synovitis and tenosynovitis, unspecified: Secondary | ICD-10-CM | POA: Diagnosis not present

## 2022-11-14 DIAGNOSIS — M1611 Unilateral primary osteoarthritis, right hip: Secondary | ICD-10-CM | POA: Diagnosis not present

## 2022-11-20 ENCOUNTER — Other Ambulatory Visit: Payer: Self-pay

## 2022-11-20 ENCOUNTER — Other Ambulatory Visit: Payer: Self-pay | Admitting: Neurosurgery

## 2022-11-20 DIAGNOSIS — M47819 Spondylosis without myelopathy or radiculopathy, site unspecified: Secondary | ICD-10-CM

## 2022-11-20 DIAGNOSIS — M47816 Spondylosis without myelopathy or radiculopathy, lumbar region: Secondary | ICD-10-CM

## 2022-11-20 MED ORDER — OXYCODONE HCL 5 MG PO TABS
5.0000 mg | ORAL_TABLET | Freq: Three times a day (TID) | ORAL | 0 refills | Status: DC | PRN
  Filled 2022-11-20: qty 21, 7d supply, fill #0

## 2022-11-20 NOTE — Progress Notes (Signed)
I sent Oxycodone in for patient to bridge to her appointment with Dr. Izora Ribas

## 2022-11-21 ENCOUNTER — Other Ambulatory Visit: Payer: Self-pay

## 2022-11-25 DIAGNOSIS — M7989 Other specified soft tissue disorders: Secondary | ICD-10-CM | POA: Diagnosis not present

## 2022-11-26 NOTE — Progress Notes (Unsigned)
Referring Physician:  Loleta Carr, [place],  Kathleen Carr [address]  Primary Physician:  Kathleen Mc, MD  History of Present Illness: 11/26/2022 Ms. Kathleen Carr is here today with a chief complaint of low back and right buttock and groin pain.  She also has pain on her anterior thigh and in her right lateral calf.  She has been having this pain for several years.  She underwent a left-sided L4-5 microdiscectomy in 2021 for left leg pain.  That pain has improved, but her back pain is worsened over time.  Prolonged standing, walking, and sitting make it worse.  Changing positions helps temporarily.  Medications have helped a small amount.  She had a very bad reaction to an injection during her previous bout with sciatica, so is not interested in considering further injections.  Bowel/Bladder Dysfunction: none  Conservative measures:  Physical therapy:  has not participated for her back; has participated in for her right hip from 08/25/22 to 10/30/22, but it was making her back pain worse. Multimodal medical therapy including regular antiinflammatories:  tylenol, ibuprofen, mobic, tramadol, and flexeril, methocarbamol  Injections:  has received epidural steroid injections 01/09/21: Right L4-5 and L5-S1 facet joint injection   Past Surgery: Left L4/5 discectomy in 2021 with Kathleen Carr has no symptoms of cervical myelopathy.  The symptoms are causing a significant impact on the patient's life.   I have utilized the care everywhere function in epic to review the outside records available from external health systems.  Telephone visit with Kathleen Render, PA-C on 11/13/2022: I spoke with Kathleen Carr today regarding pain back and leg pain.  She has been in physical therapy for her hip but is having increased pain and is not able to participate with this.  She would like to move forward with surgical consideration.  I did encourage her to contact the  physical therapist and see if they are willing to formally discharge her from PT given her increased pain and inability to participate.  She will do that today.  I have also reviewed her imaging and symptoms with Kathleen Carr who felt that a discussion of possible surgical intervention was reasonable.  Will get her scheduled to see Kathleen Carr to discuss her options.  She was encouraged to call the office should she have any questions or concerns.  Office visit with Kathleen Render, PA-C on 10/23/2022: Kathleen Carr is a 51 year old presenting today for further evaluation of persistent back pain.  She has since undergone her right hip surgery which has provided significant relief to her right groin pain however despite this she has persistent low back pain that is debilitating and unchanged in nature.  In addition to this she describes some discomfort in her left lateral leg.  She denies any other radiating leg symptoms.  She would like further evaluation and treatment of this.   Office visit with Kathleen Boot, PA-C on 07/22/2022: History of Left L5-S1 lumbar laminectomy in 2021 with Dr. Oren Carr.    History of bipolar, DM, GERD, HTN, and PTSD.    Last seen by Kathleen Carr on 03/20/22 for LBP with right groin and anterior thigh pain.    MRI 02/21/22 showed multilevel degenerative changes the lumbar spine, worst at L4-L5 where there is asymmetric left disc bulging and facet arthropathy resulting in severe left-sided neural foraminal stenosis and exiting nerve root impingement. Mild spinal canal and moderate right neural foraminal stenosis at this level.  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 Discussed lumbar ESI and PT, she wanted to hold off on these.    She is here for follow up.    She saw ortho and is scheduled for right hip surgery in October (labral tear and bone spurs per patient).    She has constant right > left sided LBP with some  pulling in front right thigh and left posterior thigh. Leg pain is intermittent. Pain is worse with prolonged standing and walking. She has pain with carrying laundry basket. Some relief relief with changing position. No numbness or tingling. No weakness noted. No bowel or bladder issues.    She is on flexeril and ultram. She has minimal improvement with these. No recent lumbar ESIs (had prior to surgery in 2021). No recent PT for her lower back.   Office visit with Kathleen Render, PA-C on 02/04/2022: Ms. Kathleen Carr is a 52 y.o with a history of diabetes (last A1c 7.2), HLD, and previous lumbar laminectomy who is here today with a chief complaint of low back pain and right radiating leg pain. She states this is been going on for about 2 years that any particular inciting event. She states that it feels like a crunching in her back when sitting upright and pulling radiating pain into her right groin and anterior thigh without extension below the knee. Her symptoms are worse with sitting for prolonged periods of time or walking for greater than 10 to 15 minutes and improves with laying flat on her back with her knees up. She states that this feels similar to the pain that she was having prior to her lumbar laminectomy in 2021. She did initially go back to Dr. Arbie Cookey after onset of the symptoms but was told there is nothing they can do. She states that she has not had any recent MRI. She did undergo a couple weeks of physical therapy but was unable to complete 6 weeks due to pain. She denies any similar left-sided symptoms. Of note she admits to about 1/2 pack per day of smoking     Review of Systems:  A 10 point review of systems is negative, except for the pertinent positives and negatives detailed in the HPI.  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   PTSD (post-traumatic stress disorder)    PTSD (post-traumatic stress disorder)    Smoker    Thyroid disease     Past Surgical History: Past Surgical History:  Procedure Laterality Date   ABDOMINAL HYSTERECTOMY     complete   BACK SURGERY  2021   lumbar surgery   CARPAL TUNNEL RELEASE Right 03/12/2022   Procedure: RIGHT CARPAL TUNNEL RELEASE;  Surgeon: Sherilyn Cooter, MD;  Location: [facility];  Service: Orthopedics;  Laterality: Right;   CARPAL TUNNEL RELEASE Left 04/16/2022   Procedure: LEFT CARPAL TUNNEL RELEASE;  Surgeon: Sherilyn Cooter, MD;  Location: [facility];  Service: Orthopedics;  Laterality: Left;   CHOLECYSTECTOMY     COLONOSCOPY WITH PROPOFOL N/A 02/05/2022  Procedure: COLONOSCOPY WITH PROPOFOL;  Surgeon: [name], MD;  Location: [facility] ENDOSCOPY;  Service: Gastroenterology;  Laterality: N/A;   ESOPHAGOGASTRODUODENOSCOPY N/A 02/05/2022   Procedure: ESOPHAGOGASTRODUODENOSCOPY (EGD);  Surgeon: [name], MD;  Location: [facility] ENDOSCOPY;  Service: Gastroenterology;  Laterality: N/A;   HIP ARTHROSCOPY Right 08/19/2022   Procedure: Right hip arthroscopy, acetabuloplasty, labral repair, femoral osteochondroplasty, capsular closure;  Surgeon: Leim Fabry, MD;  Location: [facility];  Service: Orthopedics;  Laterality: Right;   KNEE ARTHROSCOPY Bilateral    TONSILLECTOMY     as a child    Allergies: Allergies as of 11/27/2022 - Review Complete 10/23/2022  Allergen Reaction Noted   Dust mite mixed allergen ext [mite (d. farinae)]  08/01/2020   Other Hives 08/01/2020   Sulfa antibiotics Hives 06/24/2020   Latex Itching 08/12/2022   Misc. sulfonamide containing compounds Hives 05/05/2022    Medications: No outpatient medications have been marked as taking for the  11/27/22 encounter (Appointment) with Meade Maw, MD.    Social History: Social History   Tobacco Use   Smoking status: Some Days    Packs/day: 0.50    Years: 42.00    Total pack years: 21.00    Types: Cigarettes    Last attempt to quit: 05/25/2020    Years since quitting: 2.5   Smokeless tobacco: Former   Tobacco comments:    0.5 PPD 01/28/2022     Smoked since she was 52 years old  Vaping Use   Vaping Use: Former  Substance Use Topics   Alcohol use: Not Currently   Drug use: Not Currently    Family Medical History: Family History  Problem Relation Age of Onset   Hypertension Mother    Thyroid disease Mother    Lung cancer Father 70       carcinoid, right lung   Ulcerative colitis Father    Renal Disease Maternal Grandfather    Diabetes Maternal Grandfather        Type2   Glaucoma Maternal Grandmother    Stroke Paternal Grandfather    Hypertension Paternal Grandfather     Physical Examination: There were no vitals filed for this visit.  General: Patient is well developed, well nourished, calm, collected, and in no apparent distress. Attention to examination is appropriate.  Neck:   Supple.  Full range of motion.  Respiratory: Patient is breathing without any difficulty.   NEUROLOGICAL:     Awake, alert, oriented to person, place, and time.  Speech is clear and fluent.   Cranial Nerves: Pupils equal round and reactive to light.  Facial tone is symmetric.  Facial sensation is symmetric. Shoulder shrug is symmetric. Tongue protrusion is midline.  There is no pronator drift.  ROM of spine: full.    Strength: Side Biceps Triceps Deltoid Interossei Grip Wrist Ext. Wrist Flex.  R '5 5 5 5 5 5 5  '$ L '5 5 5 5 5 5 5   '$ Side Iliopsoas Quads Hamstring PF DF EHL  R '5 5 5 5 5 5  '$ L '5 5 5 5 5 5   '$ Reflexes are 1+ and symmetric at the biceps, triceps, brachioradialis, patella and achilles.   Hoffman's is absent.   Bilateral upper and lower extremity sensation is  intact to light touch.    No evidence of dysmetria noted.  Gait is antalgic.  She has positive Corky Sox on the right.  She has positive straight leg raise bilaterally at 45 degrees..     Medical Decision Making  Imaging: MRI  L spine 02/21/2022 Disc levels:   T11-T12: No significant spinal canal or neural foraminal narrowing.   T12-L1: No significant spinal canal or neural foraminal narrowing.   L1-L2: Minimal disc bulging.  No significant stenosis.   L2-L3: Mild disc bulging, ligamentum flavum hypertrophy and bowel facet arthropathy. There is mild spinal canal stenosis and minimal bilateral neural foraminal narrowing.   L3-L4: Mild bilateral facet arthropathy. No significant spinal canal or neural foraminal stenosis.   L4-L5: Asymmetric left disc bulging, ligament flavum hypertrophy mild facet arthropathy. There is mild spinal canal stenosis, severe left and moderate right neural foraminal stenosis.   L5-S1: No significant spinal canal or neural foraminal narrowing.   IMPRESSION: Multilevel degenerative changes the lumbar spine, worst at L4-L5 where there is asymmetric left disc bulging and facet arthropathy resulting in severe left-sided neural foraminal stenosis and exiting nerve root impingement. Mild spinal canal and moderate right neural foraminal stenosis at this level.   Milder degenerative changes from L1 through L4 as described above.     Electronically Signed   By: Maurine Simmering M.D.   On: 02/21/2022 09:06  I have personally reviewed the images and agree with the above interpretation.  Assessment and Plan: Ms. Ridgeway is a pleasant 52 y.o. female with back pain with right leg pain concerning for sciatica.  She does not have any left-sided symptoms.  She has developed facet arthrosis at L4-5.  It is possible that she has developed instability after her microdiscectomy.  I will obtain flexion-extension x-rays to evaluate this.  Will also send her for MRI scan to more  fully evaluate her condition given that her symptoms have worsened over the past 9 months.  Additionally, I will send her for nerve conduction study of her right lower extremity.  If she has chronic radiculopathy or peripheral neuropathy on her nerve conduction study, we will consider spinal cord stimulation.  Depending on the imaging findings, I will discuss various options with her.  I will refer her to pain management for medical management of her pain.  I spent a total of 30 minutes in this patient's care today. This time was spent reviewing pertinent records including imaging studies, obtaining and confirming history, performing a directed evaluation, formulating and discussing my recommendations, and documenting the visit within the medical record.    Thank you for involving me in the care of this patient.      Amontae Ng K. Izora Ribas MD, [facility]

## 2022-11-27 ENCOUNTER — Encounter: Payer: Self-pay | Admitting: Neurosurgery

## 2022-11-27 ENCOUNTER — Ambulatory Visit (INDEPENDENT_AMBULATORY_CARE_PROVIDER_SITE_OTHER): Payer: 59 | Admitting: Neurosurgery

## 2022-11-27 ENCOUNTER — Telehealth: Payer: Self-pay

## 2022-11-27 VITALS — BP 113/78 | HR 85 | Ht 64.0 in | Wt 164.0 lb

## 2022-11-27 DIAGNOSIS — M5416 Radiculopathy, lumbar region: Secondary | ICD-10-CM

## 2022-11-27 DIAGNOSIS — G894 Chronic pain syndrome: Secondary | ICD-10-CM | POA: Diagnosis not present

## 2022-11-27 DIAGNOSIS — M5441 Lumbago with sciatica, right side: Secondary | ICD-10-CM

## 2022-11-27 DIAGNOSIS — G8929 Other chronic pain: Secondary | ICD-10-CM

## 2022-11-27 NOTE — Telephone Encounter (Signed)
Referral faxed to [facility]. 

## 2022-11-27 NOTE — Telephone Encounter (Signed)
Please send referral for EMG of RLE to [facility]. Thanks

## 2022-11-28 ENCOUNTER — Other Ambulatory Visit: Payer: Self-pay | Admitting: Neurosurgery

## 2022-11-28 MED ORDER — OXYCODONE HCL 5 MG PO CAPS: 5.0000 mg | ORAL_CAPSULE | Freq: Three times a day (TID) | ORAL | 0 refills | Status: DC | PRN

## 2022-11-28 MED ORDER — OXYCODONE HCL 5 MG PO TABS: 5.0000 mg | ORAL_TABLET | Freq: Three times a day (TID) | ORAL | 0 refills | Status: AC | PRN

## 2022-11-28 NOTE — Progress Notes (Signed)
I have agreed to fill the patient's oxycodone while she is awaiting establishing with pain management.  The PDMP was checked and appropriate.

## 2022-12-01 ENCOUNTER — Telehealth: Payer: Self-pay

## 2022-12-01 NOTE — Telephone Encounter (Signed)
Can you let the patient know that her oxycodone has been approved with her insurance.   Oxycodone has been approved with [employer]. IW#[id number] and is valid from 12/01/22 to 12/31/22.  I have faxed the form to the pharmacy.

## 2022-12-10 ENCOUNTER — Ambulatory Visit
Admission: RE | Admit: 2022-12-10 | Discharge: 2022-12-10 | Disposition: A | Discharge status: Home / Self Care | Payer: 59 | Source: Ambulatory Visit | Attending: Neurosurgery | Admitting: Neurosurgery

## 2022-12-10 ENCOUNTER — Ambulatory Visit
Admission: RE | Admit: 2022-12-10 | Discharge: 2022-12-10 | Disposition: A | Discharge status: Home / Self Care | Payer: 59 | Attending: Neurosurgery | Admitting: Neurosurgery

## 2022-12-10 DIAGNOSIS — M1731 Unilateral post-traumatic osteoarthritis, right knee: Secondary | ICD-10-CM | POA: Diagnosis not present

## 2022-12-10 DIAGNOSIS — M5416 Radiculopathy, lumbar region: Secondary | ICD-10-CM | POA: Insufficient documentation

## 2022-12-10 DIAGNOSIS — M4316 Spondylolisthesis, lumbar region: Secondary | ICD-10-CM | POA: Diagnosis not present

## 2022-12-10 DIAGNOSIS — G8929 Other chronic pain: Secondary | ICD-10-CM | POA: Insufficient documentation

## 2022-12-10 DIAGNOSIS — G894 Chronic pain syndrome: Secondary | ICD-10-CM | POA: Diagnosis not present

## 2022-12-10 DIAGNOSIS — M25461 Effusion, right knee: Secondary | ICD-10-CM | POA: Diagnosis not present

## 2022-12-10 DIAGNOSIS — M25561 Pain in right knee: Secondary | ICD-10-CM | POA: Diagnosis not present

## 2022-12-10 DIAGNOSIS — W010XXD Fall on same level from slipping, tripping and stumbling without subsequent striking against object, subsequent encounter: Secondary | ICD-10-CM | POA: Diagnosis not present

## 2022-12-10 DIAGNOSIS — M5441 Lumbago with sciatica, right side: Secondary | ICD-10-CM | POA: Insufficient documentation

## 2022-12-10 DIAGNOSIS — M545 Low back pain, unspecified: Secondary | ICD-10-CM | POA: Diagnosis not present

## 2022-12-12 NOTE — Telephone Encounter (Signed)
01/05/2023 Kathleen Carr

## 2022-12-18 ENCOUNTER — Other Ambulatory Visit: Payer: Self-pay | Admitting: Neurosurgery

## 2022-12-18 ENCOUNTER — Telehealth: Payer: Self-pay

## 2022-12-18 DIAGNOSIS — M76891 Other specified enthesopathies of right lower limb, excluding foot: Secondary | ICD-10-CM | POA: Diagnosis not present

## 2022-12-18 MED ORDER — OXYCODONE HCL 5 MG PO TABS: 5.0000 mg | ORAL_TABLET | Freq: Three times a day (TID) | ORAL | 0 refills | Status: AC | PRN

## 2022-12-18 MED ORDER — OXYCODONE HCL 5 MG PO TABS: 5.0000 mg | ORAL_TABLET | Freq: Three times a day (TID) | ORAL | 0 refills | Status: DC | PRN

## 2022-12-18 NOTE — Progress Notes (Signed)
PDMP checked and appropriate. Sent refill of Oxycodone 42m TID PRN to [facility] per patient's request. Further refills will need to come from pain management.

## 2022-12-18 NOTE — Telephone Encounter (Signed)
Left voicemail for [facility] to cancel rx

## 2022-12-18 NOTE — Telephone Encounter (Signed)
-----   Message from [place] sent at 12/18/2022 11:07 AM EST ----- Regarding: Send refill to a different pharmacy Got notice from [facility] [address] that the oxycodone HCL 80m is unavailable, spoke with [name] and [facility]

## 2022-12-23 ENCOUNTER — Encounter: Payer: Self-pay | Admitting: Neurosurgery

## 2022-12-24 ENCOUNTER — Ambulatory Visit: Payer: 59

## 2022-12-25 ENCOUNTER — Telehealth: Payer: Self-pay | Admitting: *Deleted

## 2022-12-25 ENCOUNTER — Other Ambulatory Visit: Payer: Self-pay | Admitting: Internal Medicine

## 2022-12-25 DIAGNOSIS — E039 Hypothyroidism, unspecified: Secondary | ICD-10-CM

## 2022-12-25 DIAGNOSIS — Z79899 Other long term (current) drug therapy: Secondary | ICD-10-CM

## 2022-12-25 DIAGNOSIS — E118 Type 2 diabetes mellitus with unspecified complications: Secondary | ICD-10-CM

## 2022-12-25 DIAGNOSIS — F319 Bipolar disorder, unspecified: Secondary | ICD-10-CM

## 2022-12-25 DIAGNOSIS — G4700 Insomnia, unspecified: Secondary | ICD-10-CM

## 2022-12-25 DIAGNOSIS — E785 Hyperlipidemia, unspecified: Secondary | ICD-10-CM

## 2022-12-25 NOTE — Telephone Encounter (Signed)
Labs have been pended for your approval.

## 2022-12-25 NOTE — Telephone Encounter (Signed)
Please place future labs for upcoming lab appt.  Thanks

## 2022-12-26 ENCOUNTER — Ambulatory Visit: Payer: Self-pay | Admitting: Internal Medicine

## 2022-12-29 ENCOUNTER — Other Ambulatory Visit (INDEPENDENT_AMBULATORY_CARE_PROVIDER_SITE_OTHER): Payer: 59

## 2022-12-29 DIAGNOSIS — E118 Type 2 diabetes mellitus with unspecified complications: Secondary | ICD-10-CM | POA: Diagnosis not present

## 2022-12-29 DIAGNOSIS — Z79899 Other long term (current) drug therapy: Secondary | ICD-10-CM | POA: Diagnosis not present

## 2022-12-29 DIAGNOSIS — E785 Hyperlipidemia, unspecified: Secondary | ICD-10-CM

## 2022-12-29 DIAGNOSIS — E039 Hypothyroidism, unspecified: Secondary | ICD-10-CM | POA: Diagnosis not present

## 2022-12-29 LAB — CBC WITH DIFFERENTIAL/PLATELET
Basophils Absolute: 0.1 10*3/uL (ref 0.0–0.1)
Basophils Relative: 0.5 % (ref 0.0–3.0)
Eosinophils Absolute: 0.3 10*3/uL (ref 0.0–0.7)
Eosinophils Relative: 2.5 % (ref 0.0–5.0)
HCT: 37.3 % (ref 36.0–46.0)
Hemoglobin: 12.6 g/dL (ref 12.0–15.0)
Lymphocytes Relative: 48.9 % — ABNORMAL HIGH (ref 12.0–46.0)
Lymphs Abs: 6.1 10*3/uL — ABNORMAL HIGH (ref 0.7–4.0)
MCHC: 33.8 g/dL (ref 30.0–36.0)
MCV: 91.7 fl (ref 78.0–100.0)
Monocytes Absolute: 0.7 10*3/uL (ref 0.1–1.0)
Monocytes Relative: 5.9 % (ref 3.0–12.0)
Neutro Abs: 5.2 10*3/uL (ref 1.4–7.7)
Neutrophils Relative %: 42.2 % — ABNORMAL LOW (ref 43.0–77.0)
Platelets: 354 10*3/uL (ref 150.0–400.0)
RBC: 4.06 Mil/uL (ref 3.87–5.11)
RDW: 14 % (ref 11.5–15.5)
WBC: 12.4 10*3/uL — ABNORMAL HIGH (ref 4.0–10.5)

## 2022-12-29 LAB — COMPREHENSIVE METABOLIC PANEL
ALT: 14 U/L (ref 0–35)
AST: 13 U/L (ref 0–37)
Albumin: 4.1 g/dL (ref 3.5–5.2)
Alkaline Phosphatase: 72 U/L (ref 39–117)
BUN: 37 mg/dL — ABNORMAL HIGH (ref 6–23)
CO2: 26 mEq/L (ref 19–32)
Calcium: 9.9 mg/dL (ref 8.4–10.5)
Chloride: 102 mEq/L (ref 96–112)
Creatinine, Ser: 2.9 mg/dL — ABNORMAL HIGH (ref 0.40–1.20)
GFR: 18.21 mL/min — ABNORMAL LOW (ref >60.00)
Glucose, Bld: 100 mg/dL — ABNORMAL HIGH (ref 70–99)
Potassium: 4.1 mEq/L (ref 3.5–5.1)
Sodium: 140 mEq/L (ref 135–145)
Total Bilirubin: 0.4 mg/dL (ref 0.2–1.2)
Total Protein: 6.6 g/dL (ref 6.0–8.3)

## 2022-12-29 LAB — HEMOGLOBIN A1C: Hgb A1c MFr Bld: 6.4 % (ref 4.6–6.5)

## 2022-12-29 LAB — TSH: TSH: 3.17 u[IU]/mL (ref 0.35–5.50)

## 2022-12-29 LAB — LIPID PANEL
Cholesterol: 251 mg/dL — ABNORMAL HIGH (ref 0–200)
HDL: 46.9 mg/dL (ref >39.00)
NonHDL: 204.41
Total CHOL/HDL Ratio: 5
Triglycerides: 283 mg/dL — ABNORMAL HIGH (ref 0.0–149.0)
VLDL: 56.6 mg/dL — ABNORMAL HIGH (ref 0.0–40.0)

## 2022-12-29 LAB — LDL CHOLESTEROL, DIRECT: Direct LDL: 152 mg/dL

## 2022-12-30 ENCOUNTER — Encounter: Payer: Self-pay | Admitting: Internal Medicine

## 2022-12-31 ENCOUNTER — Ambulatory Visit: Payer: 59 | Admitting: Internal Medicine

## 2022-12-31 ENCOUNTER — Encounter: Payer: Self-pay | Admitting: Internal Medicine

## 2022-12-31 VITALS — BP 120/80 | HR 76 | Temp 98.2°F | Ht 64.0 in | Wt 169.6 lb

## 2022-12-31 DIAGNOSIS — K2950 Unspecified chronic gastritis without bleeding: Secondary | ICD-10-CM

## 2022-12-31 DIAGNOSIS — D72829 Elevated white blood cell count, unspecified: Secondary | ICD-10-CM | POA: Diagnosis not present

## 2022-12-31 DIAGNOSIS — E118 Type 2 diabetes mellitus with unspecified complications: Secondary | ICD-10-CM

## 2022-12-31 DIAGNOSIS — F319 Bipolar disorder, unspecified: Secondary | ICD-10-CM | POA: Diagnosis not present

## 2022-12-31 DIAGNOSIS — Z1231 Encounter for screening mammogram for malignant neoplasm of breast: Secondary | ICD-10-CM

## 2022-12-31 DIAGNOSIS — N179 Acute kidney failure, unspecified: Secondary | ICD-10-CM

## 2022-12-31 DIAGNOSIS — G4700 Insomnia, unspecified: Secondary | ICD-10-CM

## 2022-12-31 DIAGNOSIS — E785 Hyperlipidemia, unspecified: Secondary | ICD-10-CM

## 2022-12-31 DIAGNOSIS — M25851 Other specified joint disorders, right hip: Secondary | ICD-10-CM

## 2022-12-31 DIAGNOSIS — M130 Polyarthritis, unspecified: Secondary | ICD-10-CM

## 2022-12-31 LAB — CBC WITH DIFFERENTIAL/PLATELET
Basophils Absolute: 0.1 10*3/uL (ref 0.0–0.1)
Basophils Relative: 0.6 % (ref 0.0–3.0)
Eosinophils Absolute: 0.2 10*3/uL (ref 0.0–0.7)
Eosinophils Relative: 2 % (ref 0.0–5.0)
HCT: 35.5 % — ABNORMAL LOW (ref 36.0–46.0)
Hemoglobin: 12.1 g/dL (ref 12.0–15.0)
Lymphocytes Relative: 38 % (ref 12.0–46.0)
Lymphs Abs: 4.2 10*3/uL — ABNORMAL HIGH (ref 0.7–4.0)
MCHC: 34.2 g/dL (ref 30.0–36.0)
MCV: 91.3 fl (ref 78.0–100.0)
Monocytes Absolute: 0.5 10*3/uL (ref 0.1–1.0)
Monocytes Relative: 4.7 % (ref 3.0–12.0)
Neutro Abs: 6 10*3/uL (ref 1.4–7.7)
Neutrophils Relative %: 54.7 % (ref 43.0–77.0)
Platelets: 357 10*3/uL (ref 150.0–400.0)
RBC: 3.89 Mil/uL (ref 3.87–5.11)
RDW: 14 % (ref 11.5–15.5)
WBC: 11 10*3/uL — ABNORMAL HIGH (ref 4.0–10.5)

## 2022-12-31 LAB — URINALYSIS, ROUTINE W REFLEX MICROSCOPIC
Bilirubin Urine: NEGATIVE
Hgb urine dipstick: NEGATIVE
Ketones, ur: NEGATIVE
Nitrite: NEGATIVE
Specific Gravity, Urine: 1.025 (ref 1.000–1.030)
Total Protein, Urine: NEGATIVE
Urine Glucose: NEGATIVE
Urobilinogen, UA: 0.2 (ref 0.0–1.0)
pH: 6 (ref 5.0–8.0)

## 2022-12-31 LAB — RENAL FUNCTION PANEL
Albumin: 4.1 g/dL (ref 3.5–5.2)
BUN: 19 mg/dL (ref 6–23)
CO2: 26 mEq/L (ref 19–32)
Calcium: 9.6 mg/dL (ref 8.4–10.5)
Chloride: 103 mEq/L (ref 96–112)
Creatinine, Ser: 0.96 mg/dL (ref 0.40–1.20)
GFR: 68.62 mL/min (ref >60.00)
Glucose, Bld: 88 mg/dL (ref 70–99)
Phosphorus: 2.7 mg/dL (ref 2.3–4.6)
Potassium: 4.3 mEq/L (ref 3.5–5.1)
Sodium: 138 mEq/L (ref 135–145)

## 2022-12-31 LAB — MICROALBUMIN / CREATININE URINE RATIO
Creatinine,U: 151.6 mg/dL
Microalb Creat Ratio: 0.8 mg/g (ref 0.0–30.0)
Microalb, Ur: 1.2 mg/dL (ref 0.0–1.9)

## 2022-12-31 LAB — C-REACTIVE PROTEIN: CRP: <1 mg/dL (ref 0.5–20.0)

## 2022-12-31 LAB — H. PYLORI ANTIBODY, IGG: H Pylori IgG: NEGATIVE

## 2022-12-31 MED ORDER — ONDANSETRON HCL 4 MG PO TABS: 4.0000 mg | ORAL_TABLET | Freq: Four times a day (QID) | ORAL | 0 refills | Status: AC | PRN

## 2022-12-31 MED ORDER — LEVOTHYROXINE SODIUM 50 MCG PO TABS: ORAL_TABLET | Freq: Every day | ORAL | 1 refills | Status: DC

## 2022-12-31 MED ORDER — OXYBUTYNIN CHLORIDE ER 15 MG PO TB24: 15.0000 mg | ORAL_TABLET | Freq: Every day | ORAL | 2 refills | Status: DC

## 2022-12-31 MED ORDER — PANTOPRAZOLE SODIUM 40 MG PO TBEC: 40.0000 mg | DELAYED_RELEASE_TABLET | Freq: Every day | ORAL | 1 refills | Status: DC

## 2022-12-31 MED ORDER — SERTRALINE HCL 100 MG PO TABS: ORAL_TABLET | Freq: Every day | ORAL | 1 refills | Status: DC

## 2022-12-31 NOTE — Assessment & Plan Note (Addendum)
NSAID induced .  H PYlori negative.  PPI prescribed

## 2022-12-31 NOTE — Assessment & Plan Note (Addendum)
Resolved by repeat labs one week  after stopping daily use of NSAID;  therefore etiology is presumed due to NSAID use given normal CR Sept 2023.  Renal ultrasound,  nephrology consult .  Lab Results  Component Value Date   CREATININE 0.96 12/31/2022

## 2022-12-31 NOTE — Patient Instructions (Addendum)
You have gastritis and acute kidney failure due to  use of NSAIDs (most likely cause)  No more aleve,  motrin ibuprofen, or naproxen !.    Tylenol can be used.   Zofran refilled for nausea  Ultrasound of kidneys and nephrology referral In process

## 2022-12-31 NOTE — Assessment & Plan Note (Signed)
No improvement despite recent surgical debridement of joint.  Plans for  THR will likely be affected by her acute renal failure

## 2022-12-31 NOTE — Assessment & Plan Note (Signed)
Ruling out SLE, RA .  ESR and CRP are normal.  STOP NSAID use  Lab Results  Component Value Date   ESRSEDRATE 26 01/03/2022   Lab Results  Component Value Date   CRP <1.0 12/31/2022   No results found for: "ANA"

## 2022-12-31 NOTE — Assessment & Plan Note (Signed)
Currently well-controlled on metformin.  .   . Patient is reminded to schedule an annual eye exam and foot exam is normal today. Patient has early microalbuminuria. Patient is tolerating statin therapy for CAD risk reduction and on ACE/ARB for renal protection and hypertension  . She is no longer smoking cigarettes  but is vaping   Lab Results  Component Value Date   HGBA1C 6.4 12/29/2022   Lab Results  Component Value Date   MICROALBUR 1.2 12/31/2022   MICROALBUR 3.3 (H) 06/27/2022

## 2022-12-31 NOTE — Progress Notes (Signed)
Subjective:  Patient ID: Kathleen Carr, female    DOB: December 08, 1970  Age: 52 y.o. MRN: [id number]  CC: The primary encounter diagnosis was Encounter for screening mammogram for malignant neoplasm of breast. Diagnoses of Bipolar 1 disorder ([place]), Insomnia, unspecified type, Controlled type 2 diabetes mellitus with complication, without long-term current use of insulin ([place]), Hyperlipidemia, unspecified hyperlipidemia type, Acute renal failure, unspecified acute renal failure type ([place]), Leukocytosis, unspecified type, Other chronic gastritis without hemorrhage, Femoroacetabular impingement of right hip, and Polyarthritis were also pertinent to this visit.   HPI Kathleen Carr presents for  Chief Complaint  Patient presents with   Medical Management of Chronic Issues    6 month follow up    1) acute renal failure: noted on Feb 26 labs, in the setting of recent use of voltaren daily for several weeks starting Feb 12  following orthopedic procedures on right hip and right knee  . Her last dose was Feb 26 . Most recent CR was normal , < 1.0 on Sept 1 2023  2) Gastritis:  She was advised by Humphrey Rolls to stop the voltaren due to recurrent nausea.  Prior to use of voltaren she had been taking ibuprofen 800 every 12 hours since last summer (August)   she also reports that she has been having persistent nausea and episodes of vomiting, despite taking Protonix daily.  Last episode of nausea and vomiting occurred March 4.  She has had a 20  lb wt loss (159 lbs at Christmas_ but has regained 8 lbs , and is  now 167 by home scales ) NORMAL STOMACH, DUODENUM APRIL 2023 ENDOSCOPY.   3) Since last summer,  she has been having episode of :low grade fevers" accompanied by by body aches and fatigue ,  not occuring daily ,  but lasting several days at a time,  for months  managed with tylenol if temp was > 100.0  rarely  reached 100.4.   no headaches,  change in stool or urine ,  no loss of appetite.    4) Hip pain right,  s/p  acetabuloplasty  which did not help . Hip replacement needed.  Chronic pain management with oxycodone by Humphrey Rolls .  Not constipated   5) history of infectious mono in 2012.  She has been told she acquired it twice more since then,  , once while in [place] in 2016 , and again in 2018 ,when she was hospitalized with pneumonia and strep.   6) history of recurrent otitis media s.p myringotomy tubes as a child  Lab Results  Component Value Date   CREATININE 0.96 12/31/2022   Lab Results  Component Value Date   WBC 11.0 (H) 12/31/2022   HGB 12.1 12/31/2022   HCT 35.5 (L) 12/31/2022   MCV 91.3 12/31/2022   PLT 357.0 12/31/2022      Outpatient Medications Prior to Visit  Medication Sig Dispense Refill   albuterol (ACCUNEB) 1.25 MG/3ML nebulizer solution Take 1 ampule by nebulization every 6 (six) hours as needed for wheezing.     ALPRAZolam (XANAX) 0.25 MG tablet Take 1-2 tablets (0.25-0.5 mg total) by mouth 2 (two) times daily as needed for anxiety (will cause drowsiness.). 30 tablet 5   docusate sodium (COLACE) 100 MG capsule Take 1 capsule by mouth daily.     loratadine (CLARITIN) 10 MG tablet Take 10 mg by mouth daily.     metFORMIN (GLUCOPHAGE) 1000 MG tablet TAKE 1 TABLET BY MOUTH TWICE A DAY WITH  A MEAL 60 tablet 1   methocarbamol (ROBAXIN) 500 MG tablet Take 1 tablet (500 mg total) by mouth 4 (four) times daily. 60 tablet 0   oxyCODONE (OXY IR/ROXICODONE) 5 MG immediate release tablet Take 5 mg by mouth every 12 (twelve) hours.     QUEtiapine (SEROQUEL) 300 MG tablet TAKE 1 TABLET BY MOUTH AT BEDTIME 30 tablet 1   zolpidem (AMBIEN) 5 MG tablet TAKE 1 TABLET (5 MG TOTAL) BY MOUTH AT BEDTIME AS NEEDED FOR SLEEP. 30 tablet 5   ondansetron (ZOFRAN) 4 MG tablet Take 1 tablet (4 mg total) by mouth every 6 (six) hours as needed for nausea. 20 tablet 0   cyclobenzaprine (FLEXERIL) 10 MG tablet Take 1 tablet (10 mg total) by mouth 3 (three) times daily as needed for muscle spasms (will cause  drowsiness.). (Patient not taking: Reported on 12/31/2022) 2 tablet 0   diclofenac (VOLTAREN) 75 MG EC tablet Take 75 mg by mouth 2 (two) times daily. (Patient not taking: Reported on 12/31/2022)     levothyroxine (SYNTHROID) 50 MCG tablet TAKE 1 TABLET BY MOUTH DAILY BEFORE BREAKFAST. 90 tablet 1   oxybutynin (DITROPAN XL) 15 MG 24 hr tablet Take 1 tablet (15 mg total) by mouth at bedtime. 30 tablet 2   pantoprazole (PROTONIX) 40 MG tablet Take 1 tablet (40 mg total) by mouth daily. 90 tablet 1   sertraline (ZOLOFT) 100 MG tablet TAKE 1 TABLET BY MOUTH DAILY 90 tablet 1   No facility-administered medications prior to visit.    Review of Systems;  Patient denies headache, fevers, malaise, unintentional weight loss, skin rash, eye pain, sinus congestion and sinus pain, sore throat, dysphagia,  hemoptysis , cough, dyspnea, wheezing, chest pain, palpitations, orthopnea, edema, abdominal pain, nausea, melena, diarrhea, constipation, flank pain, dysuria, hematuria, urinary  Frequency, nocturia, numbness, tingling, seizures,  Focal weakness, Loss of consciousness,  Tremor, insomnia, depression, anxiety, and suicidal ideation.      Objective:  BP 120/80   Pulse 76   Temp 98.2 F (36.8 C) (Oral)   Ht '5\' 4"'$  (1.626 m)   Wt 169 lb 9.6 oz (76.9 kg)   SpO2 95%   BMI 29.11 kg/m   BP Readings from Last 3 Encounters:  12/31/22 120/80  11/27/22 113/78  10/23/22 116/72    Wt Readings from Last 3 Encounters:  12/31/22 169 lb 9.6 oz (76.9 kg)  11/27/22 164 lb (74.4 kg)  10/23/22 164 lb 9.6 oz (74.7 kg)    Physical Exam Vitals reviewed.  Constitutional:      General: She is not in acute distress.    Appearance: Normal appearance. She is normal weight. She is not ill-appearing, toxic-appearing or diaphoretic.  HENT:     Head: Normocephalic.  Eyes:     General: No scleral icterus.       Right eye: No discharge.        Left eye: No discharge.     Conjunctiva/sclera: Conjunctivae normal.   Cardiovascular:     Rate and Rhythm: Normal rate and regular rhythm.     Heart sounds: Normal heart sounds.  Pulmonary:     Effort: Pulmonary effort is normal. No respiratory distress.     Breath sounds: Normal breath sounds.  Musculoskeletal:        General: Normal range of motion.  Skin:    General: Skin is warm and dry.  Neurological:     General: No focal deficit present.     Mental Status: She is alert  and oriented to person, place, and time. Mental status is at baseline.  Psychiatric:        Mood and Affect: Mood normal.        Behavior: Behavior normal.        Thought Content: Thought content normal.        Judgment: Judgment normal.     Lab Results  Component Value Date   HGBA1C 6.4 12/29/2022   HGBA1C 6.7 (A) 04/02/2022   HGBA1C 7.2 (H) 01/02/2022    Lab Results  Component Value Date   CREATININE 0.96 12/31/2022   CREATININE 2.90 (H) 12/29/2022   CREATININE 0.72 06/27/2022    Lab Results  Component Value Date   WBC 11.0 (H) 12/31/2022   HGB 12.1 12/31/2022   HCT 35.5 (L) 12/31/2022   PLT 357.0 12/31/2022   GLUCOSE 88 12/31/2022   CHOL 251 (H) 12/29/2022   TRIG 283.0 (H) 12/29/2022   HDL 46.90 12/29/2022   LDLDIRECT 152.0 12/29/2022   LDLCALC 106 (H) 08/02/2020   ALT 14 12/29/2022   AST 13 12/29/2022   NA 138 12/31/2022   K 4.3 12/31/2022   CL 103 12/31/2022   CREATININE 0.96 12/31/2022   BUN 19 12/31/2022   CO2 26 12/31/2022   TSH 3.17 12/29/2022   HGBA1C 6.4 12/29/2022   MICROALBUR 1.2 12/31/2022    DG Lumbar Spine Complete  Result Date: 12/13/2022 CLINICAL DATA:  Back and leg pain EXAM: LUMBAR SPINE - COMPLETE 5 VIEW COMPARISON:  11/19/2021 x-ray series.  Lumbar spine MRI 02/21/2022 FINDINGS: Five lumbar-type vertebral bodies. Mild scattered endplate osteophytes. There is some disc height loss at L2-3 and L4-5. Trace retrolisthesis of L3 on L4. Prominent lower lumbar facet degenerative changes. Preserved bone mineralization. Surgical clips  are seen in the right upper quadrant. IMPRESSION: Mild degenerative changes. Electronically Signed   By: Jill Side M.D.   On: 12/13/2022 11:55    Assessment & Plan:  .Encounter for screening mammogram for malignant neoplasm of breast -     3D Screening Mammogram, Left and Right; Future  Bipolar 1 disorder ([facility]) -     Sertraline HCl; TAKE 1 TABLET BY MOUTH DAILY  Dispense: 90 tablet; Refill: 1  Insomnia, unspecified type -     Sertraline HCl; TAKE 1 TABLET BY MOUTH DAILY  Dispense: 90 tablet; Refill: 1  Controlled type 2 diabetes mellitus with complication, without long-term current use of insulin ([place]) Assessment & Plan: Currently well-controlled on metformin.  .   . Patient is reminded to schedule an annual eye exam and foot exam is normal today. Patient has early microalbuminuria. Patient is tolerating statin therapy for CAD risk reduction and on ACE/ARB for renal protection and hypertension  . She is no longer smoking cigarettes  but is vaping   Lab Results  Component Value Date   HGBA1C 6.4 12/29/2022   Lab Results  Component Value Date   MICROALBUR 1.2 12/31/2022   MICROALBUR 3.3 (H) 06/27/2022      Orders: -     Microalbumin / creatinine urine ratio; Future  Hyperlipidemia, unspecified hyperlipidemia type  Acute renal failure, unspecified acute renal failure type [facility]) Assessment & Plan: Resolved by repeat labs one week  after stopping daily use of NSAID;  therefore etiology is presumed due to NSAID use given normal CR Sept 2023.  Renal ultrasound,  nephrology consult .  Lab Results  Component Value Date   CREATININE 0.96 12/31/2022     Orders: -     US  RENAL; Future -     Renal function panel -     Microalbumin / creatinine urine ratio -     ANA  Leukocytosis, unspecified type Assessment & Plan: Persistent,  with low grade febrile episodes.  Recommend hematology referral to rule out CLL  Orders: -     CBC with Differential/Platelet -     Urinalysis,  Routine w reflex microscopic -     C-reactive protein -     Urine Culture; Future  Other chronic gastritis without hemorrhage Assessment & Plan: NSAID induced .  H PYlori negative.  PPI prescribed   Orders: -     H. pylori antibody, IgG  Femoroacetabular impingement of right hip Assessment & Plan: No improvement despite recent surgical debridement of joint.  Plans for  THR will likely be affected by her acute renal failure    Polyarthritis Assessment & Plan: Ruling out SLE, RA .  ESR and CRP are normal.  STOP NSAID use  Lab Results  Component Value Date   ESRSEDRATE 26 01/03/2022   Lab Results  Component Value Date   CRP <1.0 12/31/2022   No results found for: "ANA"    Other orders -     Levothyroxine Sodium; TAKE 1 TABLET BY MOUTH DAILY BEFORE BREAKFAST.  Dispense: 90 tablet; Refill: 1 -     oxyBUTYnin Chloride ER; Take 1 tablet (15 mg total) by mouth at bedtime.  Dispense: 30 tablet; Refill: 2 -     Pantoprazole Sodium; Take 1 tablet (40 mg total) by mouth daily.  Dispense: 90 tablet; Refill: 1 -     Ondansetron HCl; Take 1 tablet (4 mg total) by mouth every 6 (six) hours as needed for nausea.  Dispense: 20 tablet; Refill: 0     Follow-up: Return in about 2 weeks (around 01/14/2023).   Crecencio Mc, MD

## 2023-01-01 ENCOUNTER — Encounter: Payer: Self-pay | Admitting: Internal Medicine

## 2023-01-01 DIAGNOSIS — D72829 Elevated white blood cell count, unspecified: Secondary | ICD-10-CM

## 2023-01-01 NOTE — Assessment & Plan Note (Signed)
Persistent,  with low grade febrile episodes.  Recommend hematology referral to rule out CLL

## 2023-01-02 LAB — ANA: Anti Nuclear Antibody (ANA): NEGATIVE

## 2023-01-03 ENCOUNTER — Ambulatory Visit
Admission: RE | Admit: 2023-01-03 | Discharge: 2023-01-03 | Disposition: A | Discharge status: Home / Self Care | Payer: 59 | Source: Ambulatory Visit | Attending: Neurosurgery | Admitting: Neurosurgery

## 2023-01-03 DIAGNOSIS — G894 Chronic pain syndrome: Secondary | ICD-10-CM

## 2023-01-03 DIAGNOSIS — G8929 Other chronic pain: Secondary | ICD-10-CM

## 2023-01-03 DIAGNOSIS — R29898 Other symptoms and signs involving the musculoskeletal system: Secondary | ICD-10-CM | POA: Diagnosis not present

## 2023-01-03 DIAGNOSIS — M545 Low back pain, unspecified: Secondary | ICD-10-CM | POA: Diagnosis not present

## 2023-01-03 DIAGNOSIS — M5416 Radiculopathy, lumbar region: Secondary | ICD-10-CM

## 2023-01-03 NOTE — Assessment & Plan Note (Signed)
Persistent,  with low grade febrile episodes.  Recommend hematology referral to rule out CLL 

## 2023-01-04 ENCOUNTER — Other Ambulatory Visit: Payer: Self-pay | Admitting: Internal Medicine

## 2023-01-04 DIAGNOSIS — G4700 Insomnia, unspecified: Secondary | ICD-10-CM

## 2023-01-04 DIAGNOSIS — F319 Bipolar disorder, unspecified: Secondary | ICD-10-CM

## 2023-01-05 DIAGNOSIS — M79604 Pain in right leg: Secondary | ICD-10-CM | POA: Diagnosis not present

## 2023-01-08 ENCOUNTER — Ambulatory Visit
Admission: RE | Admit: 2023-01-08 | Discharge: 2023-01-08 | Disposition: A | Discharge status: Home / Self Care | Payer: 59 | Source: Ambulatory Visit | Attending: Internal Medicine | Admitting: Internal Medicine

## 2023-01-08 DIAGNOSIS — N179 Acute kidney failure, unspecified: Secondary | ICD-10-CM | POA: Insufficient documentation

## 2023-01-12 DIAGNOSIS — M76891 Other specified enthesopathies of right lower limb, excluding foot: Secondary | ICD-10-CM | POA: Diagnosis not present

## 2023-01-18 ENCOUNTER — Other Ambulatory Visit: Payer: Self-pay | Admitting: Internal Medicine

## 2023-01-18 DIAGNOSIS — F319 Bipolar disorder, unspecified: Secondary | ICD-10-CM

## 2023-01-18 DIAGNOSIS — G4700 Insomnia, unspecified: Secondary | ICD-10-CM

## 2023-01-21 DIAGNOSIS — F17200 Nicotine dependence, unspecified, uncomplicated: Secondary | ICD-10-CM | POA: Diagnosis not present

## 2023-01-21 DIAGNOSIS — S73191A Other sprain of right hip, initial encounter: Secondary | ICD-10-CM | POA: Diagnosis not present

## 2023-01-21 DIAGNOSIS — M25551 Pain in right hip: Secondary | ICD-10-CM | POA: Diagnosis not present

## 2023-01-21 DIAGNOSIS — M1611 Unilateral primary osteoarthritis, right hip: Secondary | ICD-10-CM | POA: Diagnosis not present

## 2023-01-25 ENCOUNTER — Other Ambulatory Visit: Payer: Self-pay | Admitting: Internal Medicine

## 2023-01-25 DIAGNOSIS — F319 Bipolar disorder, unspecified: Secondary | ICD-10-CM

## 2023-01-25 DIAGNOSIS — G4700 Insomnia, unspecified: Secondary | ICD-10-CM

## 2023-01-27 ENCOUNTER — Telehealth: Payer: Self-pay | Admitting: Neurosurgery

## 2023-01-27 NOTE — Telephone Encounter (Signed)
Pt called today stating that her disability benefits have been shut off "due to how things are worded in the notes in her chart." She says that the insurance company believes that she should be capable of doing sedentary work, but states that both Catering manager and Dr. Darreld Mclean know this to be untrue. She was wanting to know if there was anything that could be done on their end to help her situation.

## 2023-02-02 NOTE — Telephone Encounter (Signed)
Also see mychart message dated 01/05/23. She mentions an office visit from 06/2022. She saw Kennyth Arnold in that timeframe.

## 2023-02-06 ENCOUNTER — Telehealth: Payer: Self-pay

## 2023-02-06 NOTE — Telephone Encounter (Signed)
Patient called office back, note read. Patient already had an appointment scheduled with Dr Darrick Huntsman for 02/13/23. Changed appt to Pre Op.

## 2023-02-06 NOTE — Telephone Encounter (Signed)
LMTCB. Need to scheduled pt for a pre op appointment. We received a fax from [facility] requesting medical clearance.

## 2023-02-06 NOTE — Telephone Encounter (Signed)
noted 

## 2023-02-09 ENCOUNTER — Other Ambulatory Visit: Payer: Self-pay | Admitting: Orthopedic Surgery

## 2023-02-09 NOTE — Telephone Encounter (Signed)
EMG results scanned into [employer].

## 2023-02-10 ENCOUNTER — Other Ambulatory Visit: Payer: Self-pay | Admitting: Orthopedic Surgery

## 2023-02-11 DIAGNOSIS — G894 Chronic pain syndrome: Secondary | ICD-10-CM

## 2023-02-11 DIAGNOSIS — G8929 Other chronic pain: Secondary | ICD-10-CM

## 2023-02-11 DIAGNOSIS — M5416 Radiculopathy, lumbar region: Secondary | ICD-10-CM

## 2023-02-11 NOTE — Telephone Encounter (Signed)
Sent mychart message to patient

## 2023-02-13 ENCOUNTER — Ambulatory Visit (INDEPENDENT_AMBULATORY_CARE_PROVIDER_SITE_OTHER): Payer: 59

## 2023-02-13 ENCOUNTER — Ambulatory Visit: Payer: 59 | Admitting: Internal Medicine

## 2023-02-13 ENCOUNTER — Encounter: Payer: Self-pay | Admitting: Internal Medicine

## 2023-02-13 VITALS — BP 108/62 | HR 66 | Ht 64.0 in | Wt 169.0 lb

## 2023-02-13 DIAGNOSIS — Z01818 Encounter for other preprocedural examination: Secondary | ICD-10-CM

## 2023-02-13 DIAGNOSIS — N179 Acute kidney failure, unspecified: Secondary | ICD-10-CM

## 2023-02-13 MED ORDER — METFORMIN HCL 1000 MG PO TABS: ORAL_TABLET | ORAL | 1 refills | Status: DC

## 2023-02-13 MED ORDER — OXYBUTYNIN CHLORIDE ER 15 MG PO TB24: 15.0000 mg | ORAL_TABLET | Freq: Every day | ORAL | 2 refills | Status: DC

## 2023-02-13 NOTE — Patient Instructions (Signed)
Good to see you!   I will send the clearance form back to [facility] after I see your chest x ray

## 2023-02-13 NOTE — Progress Notes (Addendum)
Subjective:  Patient ID: Kathleen Carr, female    DOB: 08/30/1971  Age: 52 y.o. MRN: [id number]  CC: The primary encounter diagnosis was Pre-op examination. Diagnoses of Acute renal failure, unspecified acute renal failure type and Preoperative evaluation to rule out surgical contraindication were also pertinent to this visit.   HPI Kathleen Carr presents for preoperative evaluation  Chief Complaint  Patient presents with   Pre-op Exam    R hip replacement scheduled 03/09/23   Preoperative medical clearance, requested by her orthopedist, for elective right  hip replacement scheduled for  May 13 . She has a history of type 2 DM,  tobacco abuse (quit April 1) and  but has had no recent episodes of chest pain .   DM Type 2  has been historically well controlled  and not complicated by nephropathy , neuropathy , or PAD.  She had acute renal failure during use of diclofenac but normal  function  was restored after stopping NSAID .   Lab Results  Component Value Date   HGBA1C 6.4 12/29/2022       Outpatient Medications Prior to Visit  Medication Sig Dispense Refill   albuterol (ACCUNEB) 1.25 MG/3ML nebulizer solution Take 1 ampule by nebulization every 6 (six) hours as needed for wheezing.     ALPRAZolam (XANAX) 0.25 MG tablet TAKE 1 TO 2 TABLETS BY MOUTH 2 TIMES DAILY AS NEEDED FOR ANXIETY (WILL CAUSE DROWSINESS) 30 tablet 0   docusate sodium (COLACE) 100 MG capsule Take 1 capsule by mouth daily.     levothyroxine (SYNTHROID) 50 MCG tablet TAKE 1 TABLET BY MOUTH DAILY BEFORE BREAKFAST. 90 tablet 1   loratadine (CLARITIN) 10 MG tablet Take 10 mg by mouth daily.     methocarbamol (ROBAXIN) 500 MG tablet Take 1 tablet (500 mg total) by mouth 4 (four) times daily. 60 tablet 0   ondansetron (ZOFRAN) 4 MG tablet Take 1 tablet (4 mg total) by mouth every 6 (six) hours as needed for nausea. 20 tablet 0   oxyCODONE (OXY IR/ROXICODONE) 5 MG immediate release tablet Take 5 mg by mouth every 12 (twelve)  hours.     pantoprazole (PROTONIX) 40 MG tablet Take 1 tablet (40 mg total) by mouth daily. 90 tablet 1   QUEtiapine (SEROQUEL) 300 MG tablet TAKE 1 TABLET BY MOUTH EVERYDAY AT BEDTIME 90 tablet 1   sertraline (ZOLOFT) 100 MG tablet TAKE 1 TABLET BY MOUTH DAILY 90 tablet 1   zolpidem (AMBIEN) 5 MG tablet TAKE 1 TABLET BY MOUTH AT BEDTIME AS NEEDED FOR SLEEP 30 tablet 5   metFORMIN (GLUCOPHAGE) 1000 MG tablet TAKE 1 TABLET BY MOUTH TWICE A DAY WITH A MEAL 60 tablet 1   oxybutynin (DITROPAN XL) 15 MG 24 hr tablet Take 1 tablet (15 mg total) by mouth at bedtime. 30 tablet 2   No facility-administered medications prior to visit.    Review of Systems;  Patient denies headache, fevers, malaise, unintentional weight loss, skin rash, eye pain, sinus congestion and sinus pain, sore throat, dysphagia,  hemoptysis , cough, dyspnea, wheezing, chest pain, palpitations, orthopnea, edema, abdominal pain, nausea, melena, diarrhea, constipation, flank pain, dysuria, hematuria, urinary  Frequency, nocturia, numbness, tingling, seizures,  Focal weakness, Loss of consciousness,  Tremor, insomnia, depression, anxiety, and suicidal ideation.      Objective:  BP 108/62   Pulse 66   Ht 5\' 4"  (1.626 m)   Wt 169 lb (76.7 kg)   SpO2 97%   BMI 29.01  kg/m   BP Readings from Last 3 Encounters:  02/13/23 108/62  12/31/22 120/80  11/27/22 113/78    Wt Readings from Last 3 Encounters:  02/13/23 169 lb (76.7 kg)  12/31/22 169 lb 9.6 oz (76.9 kg)  11/27/22 164 lb (74.4 kg)    Physical Exam Vitals reviewed.  Constitutional:      General: She is not in acute distress.    Appearance: Normal appearance. She is normal weight. She is not ill-appearing, toxic-appearing or diaphoretic.  HENT:     Head: Normocephalic.  Eyes:     General: No scleral icterus.       Right eye: No discharge.        Left eye: No discharge.     Conjunctiva/sclera: Conjunctivae normal.  Cardiovascular:     Rate and Rhythm: Normal  rate and regular rhythm.     Heart sounds: Normal heart sounds.  Pulmonary:     Effort: Pulmonary effort is normal. No respiratory distress.     Breath sounds: Normal breath sounds.  Musculoskeletal:        General: Normal range of motion.  Skin:    General: Skin is warm and dry.  Neurological:     General: No focal deficit present.     Mental Status: She is alert and oriented to person, place, and time. Mental status is at baseline.  Psychiatric:        Mood and Affect: Mood normal.        Behavior: Behavior normal.        Thought Content: Thought content normal.        Judgment: Judgment normal.   Lab Results  Component Value Date   HGBA1C 6.4 12/29/2022   HGBA1C 6.7 (A) 04/02/2022   HGBA1C 7.2 (H) 01/02/2022    Lab Results  Component Value Date   CREATININE 0.96 12/31/2022   CREATININE 2.90 (H) 12/29/2022   CREATININE 0.72 06/27/2022    Lab Results  Component Value Date   WBC 11.0 (H) 12/31/2022   HGB 12.1 12/31/2022   HCT 35.5 (L) 12/31/2022   PLT 357.0 12/31/2022   GLUCOSE 88 12/31/2022   CHOL 251 (H) 12/29/2022   TRIG 283.0 (H) 12/29/2022   HDL 46.90 12/29/2022   LDLDIRECT 152.0 12/29/2022   LDLCALC 106 (H) 08/02/2020   ALT 14 12/29/2022   AST 13 12/29/2022   NA 138 12/31/2022   K 4.3 12/31/2022   CL 103 12/31/2022   CREATININE 0.96 12/31/2022   BUN 19 12/31/2022   CO2 26 12/31/2022   TSH 3.17 12/29/2022   HGBA1C 6.4 12/29/2022   MICROALBUR 1.2 12/31/2022    US RENAL  Result Date: 01/08/2023 CLINICAL DATA:  Acute renal failure.  Use of NSAIDs. EXAM: RENAL / URINARY TRACT ULTRASOUND COMPLETE COMPARISON:  None Available. FINDINGS: Right Kidney: Renal measurements: 10.6 x 3.6 x 4.0 cm = volume: 79 mL. Echogenicity within normal limits. No mass or hydronephrosis visualized. Left Kidney: Renal measurements: 11.0 x 4.9 x 5.9 cm = volume: 166 mL. Echogenicity within normal limits. No mass or hydronephrosis visualized. Bladder: Appears normal for degree of  bladder distention. Other: None. IMPRESSION: Normal study. Electronically Signed   By: Gerome Sam III M.D.   On: 01/08/2023 13:10    Assessment & Plan:  .Pre-op examination -     EKG 12-Lead -     DG Chest 2 View; Future  Acute renal failure, unspecified acute renal failure type Assessment & Plan: Resolved by repeat labs one week  after stopping daily use of NSAID;  resolved with cessation of NSAID use.  Renal  ultrasound was normal.   Lab Results  Component Value Date   CREATININE 0.96 12/31/2022      Preoperative evaluation to rule out surgical contraindication Assessment & Plan: Patient  is considered to be at low risk  For perioperative complications  Based on today's exam and history.  I have ordered and reviewed a 12 lead EKG and find that there are no acute changes and patient is in sinus rhythm.     Chest x ray is normal.  Diabetes is well controlled and renal function is normal/    Other orders -     oxyBUTYnin Chloride ER; Take 1 tablet (15 mg total) by mouth at bedtime.  Dispense: 30 tablet; Refill: 2 -     metFORMIN HCl; TAKE 1 TABLET BY MOUTH TWICE A DAY WITH A MEAL  Dispense: 60 tablet; Refill: 1     In addition to time spent reading the EKG,  I provided 30 minutes of face-to-face time during this encounter reviewing patient's last visit with me, patient's  most recent visit with cardiology,  nephrology,  and neurology,  recent surgical and non surgical procedures, previous  labs and imaging studies, counseling on currently addressed issues,  and post visit ordering to diagnostics and therapeutics .   Follow-up: Return in about 6 months (around 08/15/2023).   Sherlene Shams, MD

## 2023-02-14 NOTE — Assessment & Plan Note (Signed)
Resolved by repeat labs one week  after stopping daily use of NSAID;  resolved with cessation of NSAID use.  Renal  ultrasound was normal.   Lab Results  Component Value Date   CREATININE 0.96 12/31/2022

## 2023-02-14 NOTE — Assessment & Plan Note (Signed)
Patient  is considered to be at low risk  For perioperative complications  Based on today's exam and history.  I have ordered and reviewed a 12 lead EKG and find that there are no acute changes and patient is in sinus rhythm.     Chest x ray is normal.  Diabetes is well controlled and renal function is normal/

## 2023-02-21 ENCOUNTER — Ambulatory Visit
Admission: RE | Admit: 2023-02-21 | Discharge: 2023-02-21 | Disposition: A | Discharge status: Home / Self Care | Payer: 59 | Source: Ambulatory Visit | Attending: Neurosurgery | Admitting: Neurosurgery

## 2023-02-21 DIAGNOSIS — M79604 Pain in right leg: Secondary | ICD-10-CM | POA: Diagnosis not present

## 2023-02-21 DIAGNOSIS — M5416 Radiculopathy, lumbar region: Secondary | ICD-10-CM

## 2023-02-21 DIAGNOSIS — M545 Low back pain, unspecified: Secondary | ICD-10-CM | POA: Diagnosis not present

## 2023-02-21 DIAGNOSIS — G8929 Other chronic pain: Secondary | ICD-10-CM

## 2023-02-21 DIAGNOSIS — G894 Chronic pain syndrome: Secondary | ICD-10-CM

## 2023-02-24 ENCOUNTER — Encounter
Admission: RE | Admit: 2023-02-24 | Discharge: 2023-02-24 | Disposition: A | Discharge status: Home / Self Care | Payer: 59 | Source: Ambulatory Visit | Attending: Orthopedic Surgery | Admitting: Orthopedic Surgery

## 2023-02-24 DIAGNOSIS — Z01812 Encounter for preprocedural laboratory examination: Secondary | ICD-10-CM | POA: Diagnosis not present

## 2023-02-24 HISTORY — DX: Pneumonia, unspecified organism: J18.9

## 2023-02-24 LAB — URINALYSIS, ROUTINE W REFLEX MICROSCOPIC
Bacteria, UA: NONE SEEN
Bilirubin Urine: NEGATIVE
Glucose, UA: NEGATIVE mg/dL
Ketones, ur: NEGATIVE mg/dL
Leukocytes,Ua: NEGATIVE
Nitrite: NEGATIVE
Protein, ur: NEGATIVE mg/dL
Specific Gravity, Urine: 1.013 (ref 1.005–1.030)
pH: 5 (ref 5.0–8.0)

## 2023-02-24 LAB — CBC WITH DIFFERENTIAL/PLATELET
Abs Immature Granulocytes: 0.05 10*3/uL (ref 0.00–0.07)
Basophils Absolute: 0.1 10*3/uL (ref 0.0–0.1)
Basophils Relative: 1 %
Eosinophils Absolute: 0.2 10*3/uL (ref 0.0–0.5)
Eosinophils Relative: 2 %
HCT: 37.8 % (ref 36.0–46.0)
Hemoglobin: 12.7 g/dL (ref 12.0–15.0)
Immature Granulocytes: 1 %
Lymphocytes Relative: 57 %
Lymphs Abs: 4.5 10*3/uL — ABNORMAL HIGH (ref 0.7–4.0)
MCH: 31.2 pg (ref 26.0–34.0)
MCHC: 33.6 g/dL (ref 30.0–36.0)
MCV: 92.9 fL (ref 80.0–100.0)
Monocytes Absolute: 0.4 10*3/uL (ref 0.1–1.0)
Monocytes Relative: 5 %
Neutro Abs: 2.7 10*3/uL (ref 1.7–7.7)
Neutrophils Relative %: 34 %
Platelets: 311 10*3/uL (ref 150–400)
RBC: 4.07 MIL/uL (ref 3.87–5.11)
RDW: 12.2 % (ref 11.5–15.5)
WBC: 7.9 10*3/uL (ref 4.0–10.5)
nRBC: 0 % (ref 0.0–0.2)

## 2023-02-24 LAB — COMPREHENSIVE METABOLIC PANEL
ALT: 15 U/L (ref 0–44)
AST: 18 U/L (ref 15–41)
Albumin: 4.4 g/dL (ref 3.5–5.0)
Alkaline Phosphatase: 57 U/L (ref 38–126)
Anion gap: 9 (ref 5–15)
BUN: 23 mg/dL — ABNORMAL HIGH (ref 6–20)
CO2: 26 mmol/L (ref 22–32)
Calcium: 9.2 mg/dL (ref 8.9–10.3)
Chloride: 102 mmol/L (ref 98–111)
Creatinine, Ser: 0.91 mg/dL (ref 0.44–1.00)
GFR, Estimated: >60 mL/min (ref >60)
Glucose, Bld: 89 mg/dL (ref 70–99)
Potassium: 4 mmol/L (ref 3.5–5.1)
Sodium: 137 mmol/L (ref 135–145)
Total Bilirubin: 0.7 mg/dL (ref 0.3–1.2)
Total Protein: 7.3 g/dL (ref 6.5–8.1)

## 2023-02-24 LAB — TYPE AND SCREEN
ABO/RH(D): O POS
Antibody Screen: NEGATIVE

## 2023-02-24 LAB — SURGICAL PCR SCREEN
MRSA, PCR: NEGATIVE
Staphylococcus aureus: NEGATIVE

## 2023-02-24 NOTE — Patient Instructions (Addendum)
Your procedure is scheduled on: Monday, May 13 Report to the Registration Desk on the 1st floor of the [facility] To find out your arrival time, please call [phone] between 1PM - 3PM on: Friday, May 10 If your arrival time is 6:00 am, do not arrive before that time as the [facility] entrance doors do not open until 6:00 am.  REMEMBER: Instructions that are not followed completely may result in serious medical risk, up to and including death; or upon the discretion of your surgeon and anesthesiologist your surgery may need to be rescheduled.  Do not eat food after midnight the night before surgery.  No gum chewing or hard candies.  You may however, drink water up to 2 hours before you are scheduled to arrive for your surgery. Do not drink anything within 2 hours of your scheduled arrival time.  In addition, your doctor has ordered for you to drink the provided:  Gatorade G2 Drinking this carbohydrate drink up to two hours before surgery helps to reduce insulin resistance and improve patient outcomes. Please complete drinking 2 hours before scheduled arrival time.  One week prior to surgery: starting May 6 Stop Anti-inflammatories (NSAIDS) such as Advil, Aleve, Ibuprofen, Motrin, Naproxen, Naprosyn and Aspirin based products such as Excedrin, Goody's Powder, BC Powder. Stop ANY OVER THE COUNTER supplements until after surgery. You may however, continue to take Tylenol if needed for pain up until the day of surgery.  Continue taking all prescribed medications with the exception of the following:  Metformin - hold for 2 days before surgery. Last day to take Metformin is Friday, May 10. Resume AFTER surgery.  TAKE ONLY THESE MEDICATIONS THE MORNING OF SURGERY WITH A SIP OF WATER:  Levothyroxine Pantoprazole (Protonix) - (take one the night before and one on the morning of surgery - helps to prevent nausea after surgery.) Sertraline (Zoloft) Oxycodone if needed for  pain Alprazolam if needed for anxiety Albuterol inhaler  Use inhalers on the day of surgery and bring to the hospital.  No Alcohol for 24 hours before or after surgery.  No Smoking including e-cigarettes for 24 hours before surgery.  No chewable tobacco products for at least 6 hours before surgery.  No nicotine patches on the day of surgery.  Do not use any "recreational" drugs for at least a week (preferably 2 weeks) before your surgery.  Please be advised that the combination of cocaine and anesthesia may have negative outcomes, up to and including death. If you test positive for cocaine, your surgery will be cancelled.  On the morning of surgery brush your teeth with toothpaste and water, you may rinse your mouth with mouthwash if you wish. Do not swallow any toothpaste or mouthwash.  Use CHG Soap as directed on instruction sheet.  Do not wear jewelry, make-up, hairpins, clips or nail polish.  Do not wear lotions, powders, or perfumes.   Do not shave body hair from the neck down 48 hours before surgery.  Contact lenses, hearing aids and dentures may not be worn into surgery.  Do not bring valuables to the hospital. [facility] is not responsible for any missing/lost belongings or valuables.   Notify your doctor if there is any change in your medical condition (cold, fever, infection).  Wear comfortable clothing (specific to your surgery type) to the hospital.  After surgery, you can help prevent lung complications by doing breathing exercises.  Take deep breaths and cough every 1-2 hours. Your doctor may order a device  called an Facilities manager to help you take deep breaths.  If you are being admitted to the hospital overnight, leave your suitcase in the car. After surgery it may be brought to your room.  In case of increased patient census, it may be necessary for you, the patient, to continue your postoperative care in the Same Day Surgery department.  If you are  being discharged the day of surgery, you will not be allowed to drive home. You will need a responsible individual to drive you home and stay with you for 24 hours after surgery.   If you are taking public transportation, you will need to have a responsible individual with you.  Please call the Pre-admissions Testing Dept. at [phone] if you have any questions about these instructions.  Surgery Visitation Policy:  Patients having surgery or a procedure may have two visitors.  Children under the age of 49 must have an adult with them who is not the patient.  Inpatient Visitation:    Visiting hours are 7 a.m. to 8 p.m. Up to four visitors are allowed at one time in a patient room. The visitors may rotate out with other people during the day.  One visitor age 52 or older may stay with the patient overnight and must be in the room by 8 p.m.  Preoperative Educational Videos for Total Hip, Knee and Shoulder Replacements  To better prepare for surgery, please view our videos that explain the physical activity and discharge planning required to have the best surgical recovery at [facility].  [email or web address]  Questions? Call [phone] or email [email or web address]

## 2023-02-25 LAB — NICOTINE SCREEN, URINE: Cotinine Ql Scrn, Ur: NEGATIVE ng/mL

## 2023-02-27 DIAGNOSIS — M1611 Unilateral primary osteoarthritis, right hip: Secondary | ICD-10-CM | POA: Diagnosis not present

## 2023-02-28 ENCOUNTER — Other Ambulatory Visit: Payer: Self-pay | Admitting: Internal Medicine

## 2023-02-28 DIAGNOSIS — G4700 Insomnia, unspecified: Secondary | ICD-10-CM

## 2023-02-28 DIAGNOSIS — F319 Bipolar disorder, unspecified: Secondary | ICD-10-CM

## 2023-03-02 NOTE — Telephone Encounter (Signed)
Refilled: 01/27/2023 Last OV: 02/13/2023 Next OV: not scheduled

## 2023-03-03 ENCOUNTER — Encounter: Payer: Self-pay | Admitting: Internal Medicine

## 2023-03-08 MED ORDER — TRANEXAMIC ACID-NACL 1000-0.7 MG/100ML-% IV SOLN
1000.0000 mg | INTRAVENOUS | Status: AC
  Administered 2023-03-09 (×2): 1000 mg via INTRAVENOUS

## 2023-03-08 MED ORDER — CHLORHEXIDINE GLUCONATE 0.12 % MT SOLN
15.0000 mL | Freq: Once | OROMUCOSAL | Status: AC
  Administered 2023-03-09: 15 mL via OROMUCOSAL

## 2023-03-08 MED ORDER — DEXAMETHASONE SODIUM PHOSPHATE 10 MG/ML IJ SOLN
8.0000 mg | Freq: Once | INTRAMUSCULAR | Status: AC
  Administered 2023-03-09: 10 mg via INTRAVENOUS

## 2023-03-08 MED ORDER — CEFAZOLIN SODIUM-DEXTROSE 2-4 GM/100ML-% IV SOLN
2.0000 g | INTRAVENOUS | Status: AC
  Administered 2023-03-09: 2 g via INTRAVENOUS

## 2023-03-08 MED ORDER — SODIUM CHLORIDE 0.9 % IV SOLN: INTRAVENOUS | Status: DC

## 2023-03-08 MED ORDER — ORAL CARE MOUTH RINSE: 15.0000 mL | Freq: Once | OROMUCOSAL | Status: AC

## 2023-03-09 ENCOUNTER — Other Ambulatory Visit: Payer: Self-pay

## 2023-03-09 ENCOUNTER — Encounter: Payer: Self-pay | Admitting: Orthopedic Surgery

## 2023-03-09 ENCOUNTER — Ambulatory Visit: Payer: 59

## 2023-03-09 ENCOUNTER — Encounter
Admission: RE | Disposition: A | Discharge status: Home Health | Payer: Self-pay | Source: Ambulatory Visit | Attending: Orthopedic Surgery

## 2023-03-09 ENCOUNTER — Ambulatory Visit: Payer: 59 | Admitting: Certified Registered"

## 2023-03-09 ENCOUNTER — Ambulatory Visit: Payer: 59 | Admitting: Urgent Care

## 2023-03-09 ENCOUNTER — Observation Stay
Admission: RE | Admit: 2023-03-09 | Discharge: 2023-03-10 | Disposition: A | Discharge status: Home Health | Payer: 59 | Source: Ambulatory Visit | Attending: Orthopedic Surgery | Admitting: Orthopedic Surgery

## 2023-03-09 DIAGNOSIS — Z96649 Presence of unspecified artificial hip joint: Secondary | ICD-10-CM | POA: Diagnosis present

## 2023-03-09 DIAGNOSIS — E039 Hypothyroidism, unspecified: Secondary | ICD-10-CM | POA: Diagnosis not present

## 2023-03-09 DIAGNOSIS — I10 Essential (primary) hypertension: Secondary | ICD-10-CM | POA: Insufficient documentation

## 2023-03-09 DIAGNOSIS — S73191A Other sprain of right hip, initial encounter: Secondary | ICD-10-CM | POA: Diagnosis not present

## 2023-03-09 DIAGNOSIS — J45909 Unspecified asthma, uncomplicated: Secondary | ICD-10-CM | POA: Insufficient documentation

## 2023-03-09 DIAGNOSIS — Z7984 Long term (current) use of oral hypoglycemic drugs: Secondary | ICD-10-CM | POA: Diagnosis not present

## 2023-03-09 DIAGNOSIS — Z91040 Latex allergy status: Secondary | ICD-10-CM | POA: Diagnosis not present

## 2023-03-09 DIAGNOSIS — M1611 Unilateral primary osteoarthritis, right hip: Principal | ICD-10-CM | POA: Insufficient documentation

## 2023-03-09 DIAGNOSIS — Z87891 Personal history of nicotine dependence: Secondary | ICD-10-CM | POA: Insufficient documentation

## 2023-03-09 DIAGNOSIS — Z96641 Presence of right artificial hip joint: Secondary | ICD-10-CM | POA: Diagnosis not present

## 2023-03-09 DIAGNOSIS — E119 Type 2 diabetes mellitus without complications: Secondary | ICD-10-CM | POA: Diagnosis not present

## 2023-03-09 DIAGNOSIS — Z471 Aftercare following joint replacement surgery: Secondary | ICD-10-CM | POA: Diagnosis not present

## 2023-03-09 DIAGNOSIS — Z79899 Other long term (current) drug therapy: Secondary | ICD-10-CM | POA: Diagnosis not present

## 2023-03-09 DIAGNOSIS — Z01812 Encounter for preprocedural laboratory examination: Secondary | ICD-10-CM

## 2023-03-09 HISTORY — PX: TOTAL HIP ARTHROPLASTY: SHX124

## 2023-03-09 LAB — GLUCOSE, CAPILLARY
Glucose-Capillary: 96 mg/dL (ref 70–99)
Glucose-Capillary: 136 mg/dL — ABNORMAL HIGH (ref 70–99)
Glucose-Capillary: 206 mg/dL — ABNORMAL HIGH (ref 70–99)
Glucose-Capillary: 142 mg/dL — ABNORMAL HIGH (ref 70–99)

## 2023-03-09 SURGERY — ARTHROPLASTY, HIP, TOTAL, ANTERIOR APPROACH
Anesthesia: Spinal | Site: Hip | Laterality: Right

## 2023-03-09 MED ORDER — BUPIVACAINE HCL (PF) 0.5 % IJ SOLN
INTRAMUSCULAR | Status: DC | PRN
  Administered 2023-03-09: 2.5 mL

## 2023-03-09 MED ORDER — FENTANYL CITRATE (PF) 100 MCG/2ML IJ SOLN
INTRAMUSCULAR | Status: DC | PRN
  Administered 2023-03-09: 75 ug via INTRAVENOUS
  Administered 2023-03-09: 25 ug via INTRAVENOUS

## 2023-03-09 MED ORDER — MIDAZOLAM HCL 2 MG/2ML IJ SOLN
INTRAMUSCULAR | Status: AC
  Filled 2023-03-09: qty 2

## 2023-03-09 MED ORDER — ACETAMINOPHEN 500 MG PO TABS
1000.0000 mg | ORAL_TABLET | Freq: Three times a day (TID) | ORAL | Status: DC
  Administered 2023-03-09 – 2023-03-10 (×3): 1000 mg via ORAL

## 2023-03-09 MED ORDER — HYDROCODONE-ACETAMINOPHEN 5-325 MG PO TABS
1.0000 | ORAL_TABLET | ORAL | Status: DC | PRN
  Administered 2023-03-09: 1 via ORAL
  Administered 2023-03-09 – 2023-03-10 (×2): 2 via ORAL

## 2023-03-09 MED ORDER — TRAMADOL HCL 50 MG PO TABS
ORAL_TABLET | ORAL | Status: AC
  Filled 2023-03-09: qty 1

## 2023-03-09 MED ORDER — FENTANYL CITRATE (PF) 100 MCG/2ML IJ SOLN
INTRAMUSCULAR | Status: AC
  Filled 2023-03-09: qty 2

## 2023-03-09 MED ORDER — SERTRALINE HCL 50 MG PO TABS
100.0000 mg | ORAL_TABLET | Freq: Every day | ORAL | Status: DC
  Administered 2023-03-09: 100 mg via ORAL

## 2023-03-09 MED ORDER — ORAL CARE MOUTH RINSE: 15.0000 mL | OROMUCOSAL | Status: DC | PRN

## 2023-03-09 MED ORDER — DEXAMETHASONE SODIUM PHOSPHATE 10 MG/ML IJ SOLN
INTRAMUSCULAR | Status: AC
  Filled 2023-03-09: qty 1

## 2023-03-09 MED ORDER — TRANEXAMIC ACID 1000 MG/10ML IV SOLN
INTRAVENOUS | Status: AC
  Filled 2023-03-09: qty 10

## 2023-03-09 MED ORDER — METOCLOPRAMIDE HCL 5 MG PO TABS: 5.0000 mg | ORAL_TABLET | Freq: Three times a day (TID) | ORAL | Status: DC | PRN

## 2023-03-09 MED ORDER — MORPHINE SULFATE (PF) 4 MG/ML IV SOLN: 0.5000 mg | INTRAVENOUS | Status: DC | PRN

## 2023-03-09 MED ORDER — DOCUSATE SODIUM 100 MG PO CAPS
100.0000 mg | ORAL_CAPSULE | Freq: Two times a day (BID) | ORAL | Status: DC
  Administered 2023-03-09 – 2023-03-10 (×2): 100 mg via ORAL

## 2023-03-09 MED ORDER — ENOXAPARIN SODIUM 40 MG/0.4ML IJ SOSY
40.0000 mg | PREFILLED_SYRINGE | INTRAMUSCULAR | Status: DC
  Administered 2023-03-10: 40 mg via SUBCUTANEOUS

## 2023-03-09 MED ORDER — SODIUM CHLORIDE FLUSH 0.9 % IV SOLN
INTRAVENOUS | Status: AC
  Filled 2023-03-09: qty 10

## 2023-03-09 MED ORDER — SODIUM CHLORIDE 0.9 % IV SOLN
INTRAVENOUS | Status: AC | PRN
  Administered 2023-03-09: 100 mL via INTRAMUSCULAR

## 2023-03-09 MED ORDER — CHLORHEXIDINE GLUCONATE 0.12 % MT SOLN
OROMUCOSAL | Status: AC
  Filled 2023-03-09: qty 15

## 2023-03-09 MED ORDER — QUETIAPINE FUMARATE 300 MG PO TABS
300.0000 mg | ORAL_TABLET | Freq: Every day | ORAL | Status: DC
  Administered 2023-03-09: 300 mg via ORAL
  Filled 2023-03-09: qty 1

## 2023-03-09 MED ORDER — TRANEXAMIC ACID-NACL 1000-0.7 MG/100ML-% IV SOLN
INTRAVENOUS | Status: AC
  Filled 2023-03-09: qty 100

## 2023-03-09 MED ORDER — LEVOTHYROXINE SODIUM 50 MCG PO TABS
50.0000 ug | ORAL_TABLET | Freq: Every day | ORAL | Status: DC
  Administered 2023-03-10: 50 ug via ORAL
  Filled 2023-03-09: qty 1

## 2023-03-09 MED ORDER — PANTOPRAZOLE SODIUM 40 MG PO TBEC
40.0000 mg | DELAYED_RELEASE_TABLET | Freq: Every day | ORAL | Status: DC
  Administered 2023-03-09 – 2023-03-10 (×2): 40 mg via ORAL

## 2023-03-09 MED ORDER — CEFAZOLIN SODIUM-DEXTROSE 2-4 GM/100ML-% IV SOLN
INTRAVENOUS | Status: AC
  Filled 2023-03-09: qty 100

## 2023-03-09 MED ORDER — ZOLPIDEM TARTRATE 5 MG PO TABS
ORAL_TABLET | ORAL | Status: AC
  Filled 2023-03-09: qty 1

## 2023-03-09 MED ORDER — PROPOFOL 1000 MG/100ML IV EMUL
INTRAVENOUS | Status: AC
  Filled 2023-03-09: qty 100

## 2023-03-09 MED ORDER — ACETAMINOPHEN 500 MG PO TABS
ORAL_TABLET | ORAL | Status: AC
  Filled 2023-03-09: qty 2

## 2023-03-09 MED ORDER — BUPIVACAINE HCL (PF) 0.25 % IJ SOLN
INTRAMUSCULAR | Status: AC
  Filled 2023-03-09: qty 30

## 2023-03-09 MED ORDER — PHENOL 1.4 % MT LIQD: 1.0000 | OROMUCOSAL | Status: DC | PRN

## 2023-03-09 MED ORDER — PROPOFOL 10 MG/ML IV BOLUS
INTRAVENOUS | Status: DC | PRN
  Administered 2023-03-09: 150 ug/kg/min via INTRAVENOUS
  Administered 2023-03-09: 100 ug/kg/min via INTRAVENOUS

## 2023-03-09 MED ORDER — FENTANYL CITRATE (PF) 100 MCG/2ML IJ SOLN: 25.0000 ug | INTRAMUSCULAR | Status: DC | PRN

## 2023-03-09 MED ORDER — DOCUSATE SODIUM 100 MG PO CAPS
ORAL_CAPSULE | ORAL | Status: AC
  Filled 2023-03-09: qty 1

## 2023-03-09 MED ORDER — OXYBUTYNIN CHLORIDE ER 5 MG PO TB24
ORAL_TABLET | ORAL | Status: AC
  Filled 2023-03-09: qty 3

## 2023-03-09 MED ORDER — HYDROCODONE-ACETAMINOPHEN 5-325 MG PO TABS
ORAL_TABLET | ORAL | Status: AC
  Filled 2023-03-09: qty 1

## 2023-03-09 MED ORDER — SERTRALINE HCL 50 MG PO TABS
ORAL_TABLET | ORAL | Status: AC
  Filled 2023-03-09: qty 2

## 2023-03-09 MED ORDER — OXYBUTYNIN CHLORIDE ER 5 MG PO TB24
15.0000 mg | ORAL_TABLET | Freq: Every day | ORAL | Status: DC
  Administered 2023-03-09: 15 mg via ORAL

## 2023-03-09 MED ORDER — PHENYLEPHRINE HCL (PRESSORS) 10 MG/ML IV SOLN
INTRAVENOUS | Status: DC | PRN
  Administered 2023-03-09: 80 ug via INTRAVENOUS
  Administered 2023-03-09: 160 ug via INTRAVENOUS

## 2023-03-09 MED ORDER — 0.9 % SODIUM CHLORIDE (POUR BTL) OPTIME
TOPICAL | Status: DC | PRN
  Administered 2023-03-09: 500 mL

## 2023-03-09 MED ORDER — METOCLOPRAMIDE HCL 5 MG/ML IJ SOLN: 5.0000 mg | Freq: Three times a day (TID) | INTRAMUSCULAR | Status: DC | PRN

## 2023-03-09 MED ORDER — ZOLPIDEM TARTRATE 5 MG PO TABS
5.0000 mg | ORAL_TABLET | Freq: Every evening | ORAL | Status: DC | PRN
  Administered 2023-03-09: 5 mg via ORAL

## 2023-03-09 MED ORDER — PANTOPRAZOLE SODIUM 40 MG PO TBEC
DELAYED_RELEASE_TABLET | ORAL | Status: AC
  Filled 2023-03-09: qty 1

## 2023-03-09 MED ORDER — TRAMADOL HCL 50 MG PO TABS
50.0000 mg | ORAL_TABLET | Freq: Four times a day (QID) | ORAL | Status: DC | PRN
  Administered 2023-03-09 – 2023-03-10 (×3): 50 mg via ORAL

## 2023-03-09 MED ORDER — EPINEPHRINE PF 1 MG/ML IJ SOLN
INTRAMUSCULAR | Status: AC
  Filled 2023-03-09: qty 1

## 2023-03-09 MED ORDER — SURGIPHOR WOUND IRRIGATION SYSTEM - OPTIME
TOPICAL | Status: DC | PRN
  Administered 2023-03-09: 450 mL via TOPICAL

## 2023-03-09 MED ORDER — ONDANSETRON HCL 4 MG/2ML IJ SOLN: 4.0000 mg | Freq: Four times a day (QID) | INTRAMUSCULAR | Status: DC | PRN

## 2023-03-09 MED ORDER — CEFAZOLIN SODIUM-DEXTROSE 2-4 GM/100ML-% IV SOLN
2.0000 g | Freq: Four times a day (QID) | INTRAVENOUS | Status: AC
  Administered 2023-03-09 (×2): 2 g via INTRAVENOUS

## 2023-03-09 MED ORDER — MENTHOL 3 MG MT LOZG: 1.0000 | LOZENGE | OROMUCOSAL | Status: DC | PRN

## 2023-03-09 MED ORDER — ONDANSETRON HCL 4 MG PO TABS: 4.0000 mg | ORAL_TABLET | Freq: Four times a day (QID) | ORAL | Status: DC | PRN

## 2023-03-09 MED ORDER — ASPIRIN 81 MG PO CHEW
81.0000 mg | CHEWABLE_TABLET | Freq: Every day | ORAL | Status: DC
  Administered 2023-03-10: 81 mg via ORAL

## 2023-03-09 MED ORDER — PROPOFOL 10 MG/ML IV BOLUS
INTRAVENOUS | Status: AC
  Filled 2023-03-09: qty 40

## 2023-03-09 MED ORDER — GLYCOPYRROLATE 0.2 MG/ML IJ SOLN
INTRAMUSCULAR | Status: AC
  Filled 2023-03-09: qty 2

## 2023-03-09 MED ORDER — SURGIFLO WITH THROMBIN (HEMOSTATIC MATRIX KIT) OPTIME
TOPICAL | Status: DC | PRN
  Administered 2023-03-09: 1 via TOPICAL

## 2023-03-09 MED ORDER — BUPIVACAINE LIPOSOME 1.3 % IJ SUSP
INTRAMUSCULAR | Status: AC
  Filled 2023-03-09: qty 20

## 2023-03-09 MED ORDER — ONDANSETRON HCL 4 MG/2ML IJ SOLN
INTRAMUSCULAR | Status: DC | PRN
  Administered 2023-03-09: 4 mg via INTRAVENOUS

## 2023-03-09 MED ORDER — OXYBUTYNIN CHLORIDE 5 MG PO TABS
ORAL_TABLET | ORAL | Status: AC
  Filled 2023-03-09: qty 3

## 2023-03-09 MED ORDER — SODIUM CHLORIDE 0.9 % IV SOLN: INTRAVENOUS | Status: DC

## 2023-03-09 MED ORDER — INSULIN ASPART 100 UNIT/ML IJ SOLN: 0.0000 [IU] | Freq: Three times a day (TID) | INTRAMUSCULAR | Status: DC

## 2023-03-09 MED ORDER — MIDAZOLAM HCL 5 MG/5ML IJ SOLN
INTRAMUSCULAR | Status: DC | PRN
  Administered 2023-03-09: 2 mg via INTRAVENOUS

## 2023-03-09 MED ORDER — SODIUM CHLORIDE (PF) 0.9 % IJ SOLN
INTRAMUSCULAR | Status: DC | PRN
  Administered 2023-03-09: 50 mL via PERIARTICULAR

## 2023-03-09 MED ORDER — INSULIN ASPART 100 UNIT/ML IJ SOLN: 0.0000 [IU] | Freq: Every day | INTRAMUSCULAR | Status: DC

## 2023-03-09 MED ORDER — ONDANSETRON HCL 4 MG/2ML IJ SOLN
INTRAMUSCULAR | Status: AC
  Filled 2023-03-09: qty 4

## 2023-03-09 SURGICAL SUPPLY — 69 items
ADH SKN CLS APL DERMABOND .7 (GAUZE/BANDAGES/DRESSINGS) ×1
AGENT HMST KT MTR STRL THRMB (HEMOSTASIS) ×1
APL PRP STRL LF DISP 70% ISPRP (MISCELLANEOUS) ×1
BLADE SAGITTAL AGGR TOOTH XLG (BLADE) ×1 IMPLANT
BNDG CMPR 5X6 CHSV STRCH STRL (GAUZE/BANDAGES/DRESSINGS) ×1
BNDG COHESIVE 6X5 TAN ST LF (GAUZE/BANDAGES/DRESSINGS) ×1 IMPLANT
CHLORAPREP W/TINT 26 (MISCELLANEOUS) ×1 IMPLANT
COVER BACK TABLE REUSABLE LG (DRAPES) ×1 IMPLANT
DERMABOND ADVANCED .7 DNX12 (GAUZE/BANDAGES/DRESSINGS) ×1 IMPLANT
DRAPE 3/4 80X56 (DRAPES) ×1 IMPLANT
DRAPE C-ARM XRAY 36X54 (DRAPES) ×1 IMPLANT
DRAPE POUCH INSTRU U-SHP 10X18 (DRAPES) ×1 IMPLANT
DRSG MEPILEX SACRM 8.7X9.8 (GAUZE/BANDAGES/DRESSINGS) ×1 IMPLANT
DRSG OPSITE POSTOP 4X8 (GAUZE/BANDAGES/DRESSINGS) ×1 IMPLANT
ELECT BLADE 4.0 EZ CLEAN MEGAD (MISCELLANEOUS) ×1
ELECT REM PT RETURN 9FT ADLT (ELECTROSURGICAL) ×1
ELECTRODE BLDE 4.0 EZ CLN MEGD (MISCELLANEOUS) ×1 IMPLANT
ELECTRODE REM PT RTRN 9FT ADLT (ELECTROSURGICAL) ×1 IMPLANT
GLOVE BIO SURGEON STRL SZ8 (GLOVE) ×1 IMPLANT
GLOVE BIOGEL PI IND STRL 8 (GLOVE) ×1 IMPLANT
GLOVE PI ORTHO PRO STRL 7.5 (GLOVE) ×2 IMPLANT
GLOVE PI ORTHO PRO STRL SZ8 (GLOVE) ×2 IMPLANT
GLOVE SURG SYN 7.5  E (GLOVE) ×1
GLOVE SURG SYN 7.5 E (GLOVE) ×1 IMPLANT
GLOVE SURG SYN 7.5 PF PI (GLOVE) ×1 IMPLANT
GOWN SRG XL LVL 3 NONREINFORCE (GOWNS) ×1 IMPLANT
GOWN STRL NON-REIN TWL XL LVL3 (GOWNS) ×1
GOWN STRL REUS W/ TWL LRG LVL3 (GOWN DISPOSABLE) ×1 IMPLANT
GOWN STRL REUS W/ TWL XL LVL3 (GOWN DISPOSABLE) ×1 IMPLANT
GOWN STRL REUS W/TWL LRG LVL3 (GOWN DISPOSABLE) ×1
GOWN STRL REUS W/TWL XL LVL3 (GOWN DISPOSABLE) ×1
HANDLE YANKAUER SUCT OPEN TIP (MISCELLANEOUS) ×1 IMPLANT
HEAD CERAMIC FEMORAL 36MM (Head) IMPLANT
HOOD PEEL AWAY T7 (MISCELLANEOUS) ×2 IMPLANT
INSERT 0 DEGREE 36 (Miscellaneous) IMPLANT
IV NS 100ML SINGLE PACK (IV SOLUTION) ×1 IMPLANT
KIT PATIENT CARE HANA TABLE (KITS) ×1 IMPLANT
LIGHT WAVEGUIDE WIDE FLAT (MISCELLANEOUS) ×1 IMPLANT
MANIFOLD NEPTUNE II (INSTRUMENTS) ×1 IMPLANT
MARKER SKIN DUAL TIP RULER LAB (MISCELLANEOUS) ×1 IMPLANT
MAT ABSORB  FLUID 56X50 GRAY (MISCELLANEOUS) ×1
MAT ABSORB FLUID 56X50 GRAY (MISCELLANEOUS) ×1 IMPLANT
NDL SPNL 20GX3.5 QUINCKE YW (NEEDLE) ×1 IMPLANT
NEEDLE SPNL 20GX3.5 QUINCKE YW (NEEDLE) ×1 IMPLANT
NS IRRIG 500ML POUR BTL (IV SOLUTION) ×1 IMPLANT
PACK HIP COMPR (MISCELLANEOUS) ×1 IMPLANT
SCREW HEX LP 6.5X20 (Screw) IMPLANT
SCREW HEX LP 6.5X30 (Screw) IMPLANT
SHELL ACETAB TRIDENT 48 (Shell) IMPLANT
SLEEVE SCD COMPRESS KNEE MED (STOCKING) ×1 IMPLANT
SOLUTION IRRIG SURGIPHOR (IV SOLUTION) ×1 IMPLANT
STEM FEM HIGH OFFSET SZ2 36X99 (Stem) IMPLANT
SURGIFLO W/THROMBIN 8M KIT (HEMOSTASIS) IMPLANT
SUT BONE WAX W31G (SUTURE) ×1 IMPLANT
SUT DVC 2 QUILL PDO  T11 36X36 (SUTURE) ×1
SUT DVC 2 QUILL PDO T11 36X36 (SUTURE) ×1 IMPLANT
SUT ETHIBOND 2 V 37 (SUTURE) ×1 IMPLANT
SUT QUILL MONODERM 3-0 PS-2 (SUTURE) ×1 IMPLANT
SUT SILK 0 (SUTURE) ×1
SUT SILK 0 30XBRD TIE 6 (SUTURE) ×1 IMPLANT
SUT VIC AB 0 CT1 36 (SUTURE) ×1 IMPLANT
SUT VIC AB 2-0 CT2 27 (SUTURE) ×2 IMPLANT
SYR 10ML LL (SYRINGE) ×1 IMPLANT
SYR 30ML LL (SYRINGE) ×2 IMPLANT
TAPE MICROFOAM 4IN (TAPE) IMPLANT
TOWEL OR 17X26 4PK STRL BLUE (TOWEL DISPOSABLE) IMPLANT
TRAP FLUID SMOKE EVACUATOR (MISCELLANEOUS) ×1 IMPLANT
WAND WEREWOLF FASTSEAL 6.0 (MISCELLANEOUS) ×1 IMPLANT
WATER STERILE IRR 1000ML POUR (IV SOLUTION) ×1 IMPLANT

## 2023-03-09 NOTE — Op Note (Signed)
Patient Name: Kathleen Carr  WUJ:[id number]  Pre-Operative Diagnosis: Right hip Osteoarthritis s/p labral repair and femoroplasty  Post-Operative Diagnosis: (same)  Procedure: Right Total Hip Arthroplasty  Components/Implants: Cup: Trident Tritanium Clusterhole 48mm w/ x 2 screws    Liner: Neutral X3 Poly 36/D  Stem: Insignia #2 High offset  Head:Biolox ceramic 36 +98mm   Date of Surgery: 03/09/2023  Surgeon: Reinaldo Berber MD  Assistant: Amador Cunas PA (present and scrubbed throughout the case, critical for assistance with exposure, retraction, instrumentation, and closure)   Anesthesiologist: Karlton Lemon  Anesthesia: Spinal  EBL: 300cc  IVF:1000cc  Complications: None   Brief history: The patient is a 52 year old female with a history of osteoarthritis and labral tear of the right hip with pain limiting their range of motion and activities of daily living, which has failed multiple attempts at conservative therapy, injections, and labral repair and femoroplasty.  The risks and benefits of total hip arthroplasty as definitive surgical treatment were discussed with the patient, who opted to proceed with the operation.  After outpatient medical clearance and optimization was completed the patient was admitted to [facility] for the procedure.  All preoperative films were reviewed and an appropriate surgical plan was made prior to surgery.   Description of procedure: The patient was brought to the operating room where laterality was confirmed by all those present to be the right side.  The patient was administered spinal anesthesia on a stretcher prior to being moved supine on the operating room table. Patient was given an intravenous dose of antibiotics for surgical prophylaxis and TXA .  All bony prominences and extremities were well padded and the patient was securely attached to the table boots, a perineal post was placed and the patient had a safety strap placed.   Surgical site was prepped with alcohol and chlorhexidine. The surgical site over the hip was and draped in typical sterile fashion with multiple layers of adhesive and nonadhesive drapes.  The incision site was marked out with a sterile marker and care was taken to assess the position of the ASIS and ensure appropriate position for the incision. Prior hip arthroscopy portals noted of the anterolateral thigh, well healed.   A surgical timeout was then called with participation of all staff in the room the patient was then a confirmed again and laterality confirmed.  Incision was made over the anterior lateral aspect of the proximal thigh in line with the TFL.  Appropriate retractors were placed and all bleeding vessels were coagulated within the subcutaneous and fatty layers. Scar tissue track was encountered and carefully dissected through.  An incision was made in the TFL fascia in the interval was carefully identified.  The lateral ascending branches of the circumflex vessels were identified, cauterized and carefully dissected. The main vessels were then tied with a 0 silk hand tie.  Retractors were placed around the superior lateral and inferior medial aspects of the femoral neck and a capsulotomy was performed exposing the hip joint.  A healed arthrotomy was noted with sutures in place in the capsule. Retraction stitches were placed and the capsulotomy to assist with visualization.  Femoral neck cut was then made and the femoral head was extracted after placing the leg in traction. Bone wax was then applied to the proximal cut surface of the femur and aqua mantis was used to address any bleeding around the femoral neck cut.  Retractors were then placed around the acetabulum to fully visualize the joint space. There were several  labral stitches in place with superior anchors. The lateral most of which was adjacent to torn and frayed labrum which was complex in nature. Adjacent the to labral tear was cartilage  delamination which extended up into the dome of the acetabulum with complete eburnation of the cartilage down to bone. The remaining labral tissue and sutures were removed and pulvinar was removed.   The acetabulum was then sequentially reamed up to the appropriate size in order to get good fit and fill for the acetabular component while under fluoroscopic guidance.  Acetabular component was then placed and malleted into a secure fit while confirming position and abduction angle and anteversion utilizing fluoroscopy.  2 screws were then placed in the acetabular cup to assist in securing the cup in place.  A trial acetabular polyliner was then placed and attention moved to the femur.  The femur traction was dropped and sequentially externally rotated while performing a release of the posterior and superomedial tissues off of the proximal femur to allow for mobility, care was taken to preserve the external rotators and piriformis attachments.  The remaining interval between the abductors and the capsule was dissected out and a retractor was placed over the superolateral aspect of the femur over the greater trochanter.  The leg was carefully brought down into extension and adducted to provide visualization of the proximal femur for broaching.  The femur was then sequentially broached up to an appropriate size which provided for good fill and stability to the femoral broach.  A trial neck and head were placed on the femoral broach and the leg was brought up for reduction.  The hip was reduced and manual check of stability was performed with the boot detached from the table.  The hip was found to be stable in flexion internal rotation and extension external rotation.  Leg lengths were confirmed on fluoroscopy.   The hip was then dislocated the trial neck and head were removed.  The trial acetabular liner was removed.  The hip was then irrigated with normal saline and the final poly liner was implanted.  The leg was  then brought down into extension and adduction in the proximal femur was reexposed.  The broach trial was removed and the femur was irrigated with normal saline prior to the real femoral stem being implanted.  After the femoral stem was seated and shown to have good fit and fill the appropriate head was impacted the leg was brought up and reduced.  There was good range of motion with stability in flexion internal rotation and extension external rotation on testing.  Leg lengths were found to be appropriate on fluoroscopic evaluation at this time.  The hip was then irrigated with betdine based surgiphor solution and then saline solution.  The capsulotomy was repaired with Ethibond sutures.  A pericapsular and peritrochanteric cocktail with Exparel and bupivacaine was then injected as well as the subcutaneous tissues. Surgiflo was applied to the cut edges of the neck and around the acetabulum to help reduced postoperative bleeding. The fascia was closed with a #2 barbed running suture.  The deep tissues were closed with Vicryl sutures the subcutaneous tissues were closed with interrupted Vicryl sutures and a running barbed 3-0 suture.  The skin was then reinforced with Dermabond and a sterile dressing was placed.   The patient was awoken from anesthesia transferred off of the operating room table onto a hospital bed where examination of leg lengths found the leg lengths to be equal with a good  distal pulse.  The patient was then transferred to the PACU in stable condition.

## 2023-03-09 NOTE — Interval H&P Note (Signed)
Patient history and physical updated. Consent reviewed including risks, benefits, and alternatives to surgery. Patient agrees with above plan to proceed with right anterior total hip arthroplasty.   

## 2023-03-09 NOTE — Anesthesia Preprocedure Evaluation (Signed)
Anesthesia Evaluation  Patient identified by MRN, date of birth, ID band Patient awake    Reviewed: Allergy & Precautions, NPO status , Patient's Chart, lab work & pertinent test results  History of Anesthesia Complications Negative for: history of anesthetic complications  Airway Mallampati: II  TM Distance: >3 FB Neck ROM: full    Dental  (+) Chipped, Partial Lower   Pulmonary neg shortness of breath, asthma , neg COPD, neg recent URI, Patient abstained from smoking., former smoker   Pulmonary exam normal        Cardiovascular Exercise Tolerance: Good hypertension, On Medications (-) angina (-) Past MI and (-) Cardiac Stents Normal cardiovascular exam(-) dysrhythmias (-) Valvular Problems/Murmurs     Neuro/Psych neg Seizures PSYCHIATRIC DISORDERS Anxiety Depression Bipolar Disorder    Neuromuscular disease    GI/Hepatic negative GI ROS, Neg liver ROS,GERD  ,,  Endo/Other  diabetesHypothyroidism    Renal/GU negative Renal ROS  negative genitourinary   Musculoskeletal   Abdominal   Peds  Hematology negative hematology ROS (+)   Anesthesia Other Findings Past Medical History: No date: Allergy No date: Anxiety No date: Arthritis No date: Asthma No date: Bipolar depression (HCC) No date: Carpal tunnel syndrome 01/14/2022: Cat bite No date: Depression No date: Diabetes mellitus without complication (HCC) No date: GERD (gastroesophageal reflux disease) No date: Hypertension No date: Hypothyroidism 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/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 No date: PTSD (post-traumatic stress disorder) No date: PTSD (post-traumatic stress disorder) No date: Smoker No date: Thyroid disease  Past Surgical History: No date: ABDOMINAL HYSTERECTOMY     Comment:  complete 2021: BACK SURGERY     Comment:  lumbar surgery 03/12/2022: CARPAL TUNNEL RELEASE; Right     Comment:  Procedure: RIGHT CARPAL TUNNEL RELEASE;  Surgeon:                Marlyne Beards, MD;  Location: [facility];  Service: Orthopedics;  Laterality: Right; 04/16/2022: CARPAL TUNNEL RELEASE; Left     Comment:  Procedure: LEFT CARPAL TUNNEL RELEASE;  Surgeon:               Marlyne Beards, MD;  Location: [facility];  Service: Orthopedics;  Laterality: Left; No date: CHOLECYSTECTOMY 02/05/2022: COLONOSCOPY WITH PROPOFOL; N/A     Comment:  Procedure: COLONOSCOPY WITH PROPOFOL;  Surgeon: Wyline Mood, MD;  Location: [facility];  Service:               Gastroenterology;  Laterality: N/A; 02/05/2022: ESOPHAGOGASTRODUODENOSCOPY; N/A     Comment:  Procedure: ESOPHAGOGASTRODUODENOSCOPY (EGD);  Surgeon:               Wyline Mood, MD;  Location: [facility];  Service:               Gastroenterology;  Laterality: N/A; No date: KNEE ARTHROSCOPY; Bilateral No date: TONSILLECTOMY     Comment:  as a child  BMI    Body Mass Index: 28.32 kg/m      Reproductive/Obstetrics negative OB ROS                             Anesthesia Physical Anesthesia Plan  ASA: 2  Anesthesia Plan: Spinal   Post-op Pain Management:  Tylenol PO (pre-op)* and Gabapentin PO (pre-op)*   Induction: Intravenous  PONV Risk Score and Plan: 4 or greater and Treatment may vary due to age or medical condition, Propofol infusion and TIVA  Airway Management Planned: Natural Airway and Nasal Cannula  Additional Equipment:   Intra-op Plan:   Post-operative Plan:   Informed Consent: I have reviewed the patients History and Physical, chart, labs and discussed the procedure including the risks, benefits and alternatives for the proposed anesthesia with the patient or authorized representative who has indicated his/her understanding and acceptance.     Dental Advisory Given  Plan Discussed with: Anesthesiologist, CRNA and Surgeon  Anesthesia Plan Comments: (Patient consented for risks  of anesthesia including but not limited to:  - adverse reactions to medications - damage to eyes, teeth, lips or other oral mucosa - nerve damage due to positioning  - risk of bleeding, infection and or nerve damage from spinal that could lead to paralysis - risk of headache or failed spinal - damage to teeth, lips or other oral mucosa - sore throat or hoarseness - damage to heart, brain, nerves, lungs, other parts of body or loss of life  Patient voiced understanding.)        Anesthesia Quick Evaluation

## 2023-03-09 NOTE — H&P (Signed)
Chief Complaint  Patient presents with  Pre-op Exam  Scheduled for Right direct anterior THA 03/09/23 with Dr. Deborah Chalk Kathleen Carr is a 52 y.o. female who presents today for history and physical for right anterior total hip arthroplasty with Dr. Audelia Acton on 03/09/2023. Patient has had greater than 12 months of right groin pain. Pain has been increasing and severe. Patient has seen Dr. Allena Katz and underwent hip arthroscopy in 2023 for a labral tear, there was significant labral degeneration along with tearing that was mostly unrepairable. There was significant damage seen on arthroscopy along the acetabulum. Patient has significant sharp locking catching pain in her groin with walking or performing any activities of daily living. She only gets relief with laying down with standing or walking her pain is 8 out of 10. She has been treated with pain medications including oxycodone, Tylenol and methocarbamol from pain clinic. She takes 5 mg oxycodone twice daily. She is allergic to NSAIDs. She has underwent a right hip intra-articular steroid injection 11/14/2022 and reported good relief for only a few hours. She has had physical therapy with no improvement. She also has a history of spine issues but is currently being followed by neurosurgery.  The patient is diabetic controlled on medications with a last A1c of 6.4, she does vape nicotine products, her BMI is 29 Of note she does have history of spine surgery in 2021 for herniated disks.  Past Medical History: Past Medical History:  Diagnosis Date  Allergy  Anxiety  Arthritis  Asthma (HHS-HCC)  Bipolar depression (CMS/HHS-HCC)  Cat bite  Depression  Diabetes mellitus without complication (CMS/HHS-HCC)  GERD (gastroesophageal reflux disease)  Hypertension  Pasteurella infection  PTSD (post-traumatic stress disorder)  Thyroid disease   Past Surgical History: Past Surgical History:  Procedure Laterality Date  LUMBAR HEMILAMINECTOMY 2021  Dr.  Noralyn Pick at [facility]  ABDOMINAL HYSTERECTOMY  CHOLECYSTECTOMY  ENDOSCOPIC CARPAL TUNNEL RELEASE Bilateral  KNEE ARTHROSCOPY Bilateral  TONSILLECTOMY   Past Family History: Family History  Problem Relation Age of Onset  Thyroid disease Mother  High blood pressure (Hypertension) Mother  Cancer Father  Ulcerative colitis Father  Glaucoma Maternal Grandmother  Diabetes Maternal Grandfather  Chronic renal disease Maternal Grandfather  Stroke Paternal Grandfather  High blood pressure (Hypertension) Paternal Grandfather   Medications: Current Outpatient Medications  Medication Sig Dispense Refill  albuterol 90 mcg/actuation inhaler Inhale 1-2 puffs into the lungs every 6 (six) hours as needed for wheezing.  ALPRAZolam (XANAX) 0.25 MG tablet Take 1-2 tablets (0.25-0.5 mg total) by mouth 2 (two) times daily as needed for anxiety (will cause drowsiness.).  cholecalciferol (VITAMIN D3) 1000 unit tablet Take 1,000 Units by mouth daily.  levothyroxine (SYNTHROID) 50 MCG tablet Take 50 mcg by mouth every morning before breakfast  loratadine (CLARITIN) 10 mg tablet Take 10 mg by mouth once daily  metFORMIN (GLUCOPHAGE) 1000 MG tablet Take 1,000 mg by mouth once daily  methocarbamoL (ROBAXIN) 500 MG tablet Take by mouth  ondansetron (ZOFRAN) 4 MG tablet Take 1 tablet (4 mg total) by mouth every 6 (six) hours as needed for nausea. 20 tablet 0  oxybutynin (DITROPAN-XL) 10 MG XL tablet Take 10 mg by mouth at bedtime  oxyCODONE (ROXICODONE) 5 MG immediate release tablet Take 1 tablet (5 mg total) by mouth every 12 (twelve) hours as needed for Pain 20 tablet 0  pantoprazole (PROTONIX) 40 MG DR tablet Take 40 mg by mouth once daily  QUEtiapine (SEROQUEL) 300 MG tablet Take 300 mg by mouth  at bedtime as needed  sertraline (ZOLOFT) 100 MG tablet Take 100 mg by mouth once daily  zolpidem (AMBIEN) 5 MG tablet Take 5 mg by mouth at bedtime as needed for Sleep   No current facility-administered  medications for this visit.   Allergies: Allergies  Allergen Reactions  Other Hives  Allergy to Hickory, walnut and Birch trees and all grasses and allergic to Rabbits- patient reports anaphylactic  Sulfa (Sulfonamide Antibiotics) Hives  Latex Itching  Nsaids (Non-Steroidal Anti-Inflammatory Drug) Other (See Comments)  Acute renal failure    Review of Systems:  A comprehensive 14 point ROS was performed, reviewed by me today, and the pertinent orthopaedic findings are documented in the HPI.  Exam: BP 138/84  Ht 162.6 cm (5\' 4" )  Wt 76.7 kg (169 lb 3.2 oz)  LMP (LMP Unknown)  BMI 29.04 kg/m  General:  Well developed, well nourished, no apparent distress, normal affect, normal gait with no antalgic component.   HEENT: Head normocephalic, atraumatic, PERRL.   Abdomen: Soft, non tender, non distended, Bowel sounds present.  Heart: Examination of the heart reveals regular, rate, and rhythm. There is no murmur noted on ascultation. There is a normal apical pulse.  Lungs: Lungs are clear to auscultation. There is no wheeze, rhonchi, or crackles. There is normal expansion of bilateral chest walls.   Right hip exam  SKIN: intact SWELLING: none WARMTH: no warmth TENDERNESS: none, Stinchfield Positive ROM: 5 degrees internal rotation and 45 degrees external rotation and pain with internal rotation, localized to the groin; Hip Flexion 90 STRENGTH: normal GAIT: antalgic and stiff-legged STABILITY: stable to testing CREPITUS: no LEG LENGTH DISCREPANCY: none NEUROLOGICAL EXAM: normal VASCULAR EXAM: normal LUMBAR SPINE: tenderness: no straight leg raising sign: no motor exam: normal  The contralateral hip was examined for comparison and it showed: TENDERNESS: none ROM: normal and full STRENGTH: normal STABILITY: stable to testing  Hip Imaging :  I reviewed AP pelvis and lateral sit/stand sacral pelvic x-rays.Patient has mild to moderate degenerative changes of the right  hip with medial and inferior joint space narrowing with sclerosis and osteophyte formation. Her lateral x-rays show sacral clinician motion from sitting to standing of about 11 degrees of motion showing moderate decrease in pelvic motion through sitting to standing.  I reviewed AP pelvis, false profile, and lateral right hip x-ray performed 10/06/2022 images show some superior acetabular sclerosis and medial joint space narrowing of the right hip with some inferior acetabular osteophyte formation all some bony changes to the head neck junction on the femur compared to prior images consistent with possible femoroplasty. No acute fractures dislocations noted moderate right hip arthritis.  MRI without contrast of the right hip performed 06/02/2022. No evidence of any fractures dislocations or AVN. The left hip has some mild chondral thinning with some subchondral cyst formation acetabulum. The right hip has chondral surface irregularity along the superior acetabulum with chondral thinning on the anterior superior femoral head no large subchondral marrow changes. There is some blunting of the labrum possible tear. Mild to moderate osteoarthritis of both hips worse on the right. Generally agree with radiology interpretation.  Arthroscopic images from 08/19/2022 of the right hip arthroscopy procedure images reviewed by myself which show complex severe degenerative tearing of the labrum with some partial-thickness cartilage loss on the superior anterior femoral head and a full-thickness chondral flap on the acetabulum which extends into the labrum.  Impression: Primary osteoarthritis of right hip [M16.11] Primary osteoarthritis of right hip (primary encounter diagnosis)  Plan:  30. 52 year old female with greater than 12 months of right hip pain. She has advanced degenerative changes throughout the right hip joint. Degenerative changes are seen on MRI, arthroscopic pictures as well as x-ray findings. She has had  no relief with conservative treatment consisting of PT, medications, intra-articular injections. Risks, benefits, complications of a right anterior total hip arthroplasty been discussed with the patient. Patient has agreed and consented to procedure with Dr. Audelia Acton on 03/09/2023  The hospitalization and post-operative care and rehabilitation were also discussed. The use of perioperative antibiotics and DVT prophylaxis were discussed. The risk, benefits and alternatives to a surgical intervention were discussed at length with the patient. The patient was also advised of risks related to the medical comorbidities and elevated body mass index (BMI). A lengthy discussion took place to review the most common complications including but not limited to: deep vein thrombosis, pulmonary embolus, heart attack, stroke, infection, wound breakdown, heterotopic ossification, dislocation, numbness, leg length in-equality, intraoperative fracture, damage to nerves, tendon,muscles, arteries or other blood vessels, death and other possible complications from anesthesia. The patient was told that we will take steps to minimize these risks by using sterile technique, antibiotics and DVT prophylaxis when appropriate and follow the patient postoperatively in the office setting to monitor progress. The possibility of recurrent pain, no improvement in pain and actual worsening of pain were also discussed with the patient. The risk of dislocation following total hip replacement was discussed and potential precautions to prevent dislocation were reviewed.  We had a specific conversation about her young age and her increased risk of component wear and possible need for revision during her life.  This note was generated in part with voice recognition software and I apologize for any typographical errors that were not detected and corrected.

## 2023-03-09 NOTE — Transfer of Care (Signed)
Immediate Anesthesia Transfer of Care Note  Patient: Kathleen Carr  Procedure(s) Performed: TOTAL HIP ARTHROPLASTY ANTERIOR APPROACH (Right: Hip)  Patient Location: PACU  Anesthesia Type:General  Level of Consciousness: awake and alert   Airway & Oxygen Therapy: Patient Spontanous Breathing  Post-op Assessment: Report given to RN and Post -op Vital signs reviewed and stable  Post vital signs: stable  Last Vitals:  Vitals Value Taken Time  BP 109/77 03/09/23 1042  Temp 36.2 C 03/09/23 1042  Pulse 83 03/09/23 1044  Resp 12 03/09/23 1044  SpO2 95 % 03/09/23 1044  Vitals shown include unvalidated device data.  Last Pain:  Vitals:   03/09/23 0717  TempSrc: Temporal  PainSc: 6          Complications: No notable events documented.

## 2023-03-09 NOTE — Evaluation (Signed)
Physical Therapy Evaluation Patient Details Name: Kathleen Carr MRN: [id number] DOB: 1971/10/06 Today's Date: 03/09/2023  History of Present Illness  Pt admitted for R THR and is POD 0 at time of evaluation. History includes bipolar, depression, DM, GERD, HTN, and PTSD. Underwent hip arthroscopy for labral tear in 2023. Pt is very eager to participate in PT this date.  Clinical Impression  Pt is a pleasant 52 year old female who was admitted for R THR and is POD 0 at time of evaluation. Pt performs bed mobility, transfers, and ambulation with mod I and RW. Per RN, was able to ambulate to bathroom with RN staff with safe technique. Pt demonstrates deficits with strength/mobility/pain. Would benefit from skilled PT to address above deficits and promote optimal return to PLOF. Pt will continue to receive skilled PT services while admitted and will defer to TOC/care team for updates regarding disposition planning. Will plan for stair progression next date.      Recommendations for follow up therapy are one component of a multi-disciplinary discharge planning process, led by the attending physician.  Recommendations may be updated based on patient status, additional functional criteria and insurance authorization.  Follow Up Recommendations       Assistance Recommended at Discharge Set up Supervision/Assistance  Patient can return home with the following  A little help with walking and/or transfers    Equipment Recommendations None recommended by PT  Recommendations for Other Services       Functional Status Assessment Patient has had a recent decline in their functional status and demonstrates the ability to make significant improvements in function in a reasonable and predictable amount of time.     Precautions / Restrictions Precautions Precautions: Anterior Hip Precaution Booklet Issued: No Restrictions Weight Bearing Restrictions: Yes RLE Weight Bearing: Weight bearing as tolerated       Mobility  Bed Mobility Overal bed mobility: Modified Independent             General bed mobility comments: safe technique with upright posture    Transfers Overall transfer level: Modified independent Equipment used: Rolling walker (2 wheels)               General transfer comment: safe technique with upright posture. Good weight acceptance on surgical leg    Ambulation/Gait Ambulation/Gait assistance: Modified independent (Device/Increase time) Gait Distance (Feet): 150 Feet Assistive device: Rolling walker (2 wheels) Gait Pattern/deviations: Step-through pattern       General Gait Details: reciprocal gait pattern with safe technique. RW used with cues for posture and looking forward. RW adjusted to patient height  Stairs            Wheelchair Mobility    Modified Rankin (Stroke Patients Only)       Balance Overall balance assessment: History of Falls                                           Pertinent Vitals/Pain Pain Assessment Pain Assessment: No/denies pain    Home Living Family/patient expects to be discharged to:: Private residence Living Arrangements: Spouse/significant other Available Help at Discharge: Family;Available 24 hours/day Type of Home: House Home Access: Stairs to enter Entrance Stairs-Rails: None Entrance Stairs-Number of Steps: 2   Home Layout: One level Home Equipment: Agricultural consultant (2 wheels);BSC/3in1      Prior Function Prior Level of Function : Independent/Modified Independent  Mobility Comments: very active at baseline[place] Reports 1 fall recently. Reports she plans to stay at hotel for 1 week post op ADLs Comments: indep     Hand Dominance        Extremity/Trunk Assessment   Upper Extremity Assessment Upper Extremity Assessment: Overall WFL for tasks assessed    Lower Extremity Assessment Lower Extremity Assessment: Generalized weakness (R LE  grossly 4/5)       Communication   Communication: No difficulties  Cognition Arousal/Alertness: Awake/alert Behavior During Therapy: WFL for tasks assessed/performed Overall Cognitive Status: Within Functional Limits for tasks assessed                                          General Comments      Exercises Other Exercises Other Exercises: seated ther-ex performed on R LE including AP, LAQ, and hip ab/add x 10 reps   Assessment/Plan    PT Assessment Patient needs continued PT services  PT Problem List Decreased strength;Decreased balance;Decreased mobility       PT Treatment Interventions DME instruction;Gait training;Stair training;Therapeutic exercise    PT Goals (Current goals can be found in the Care Plan section)  Acute Rehab PT Goals Patient Stated Goal: to go home PT Goal Formulation: With patient Time For Goal Achievement: 03/23/23 Potential to Achieve Goals: Good    Frequency BID     Co-evaluation               AM-PAC PT "6 Clicks" Mobility  Outcome Measure Help needed turning from your back to your side while in a flat bed without using bedrails?: None Help needed moving from lying on your back to sitting on the side of a flat bed without using bedrails?: None Help needed moving to and from a bed to a chair (including a wheelchair)?: A Little Help needed standing up from a chair using your arms (e.g., wheelchair or bedside chair)?: A Little Help needed to walk in hospital room?: A Little Help needed climbing 3-5 steps with a railing? : A Little 6 Click Score: 20    End of Session Equipment Utilized During Treatment: Gait belt Activity Tolerance: Patient tolerated treatment well Patient left: in chair Nurse Communication: Mobility status PT Visit Diagnosis: Muscle weakness (generalized) (M62.81);Difficulty in walking, not elsewhere classified (R26.2)    Time: 2956-2130 PT Time Calculation (min) (ACUTE ONLY): 18  min   Charges:   PT Evaluation $PT Eval Low Complexity: 1 Low PT Treatments $Gait Training: 8-22 mins        Kathleen Carr, PT, DPT, GCS [phone]   Kathleen Carr 03/09/2023, 4:38 PM

## 2023-03-09 NOTE — Anesthesia Procedure Notes (Signed)
Spinal  Start time: 03/09/2023 8:30 AM End time: 03/09/2023 8:38 AM Reason for block: surgical anesthesia Staffing Performed: resident/CRNA  Anesthesiologist: Lenard Simmer, MD Resident/CRNA: Maryla Morrow., CRNA Performed by: Maryla Morrow., CRNA Authorized by: Lenard Simmer, MD   Preanesthetic Checklist Completed: patient identified, IV checked, site marked, risks and benefits discussed, surgical consent, monitors and equipment checked, pre-op evaluation and timeout performed Spinal Block Patient position: sitting Prep: ChloraPrep Patient monitoring: heart rate, cardiac monitor, continuous pulse ox and blood pressure Approach: midline Location: L3-4 Injection technique: single-shot Needle Needle gauge: 25 G Needle length: 5 cm

## 2023-03-10 ENCOUNTER — Encounter: Payer: Self-pay | Admitting: Orthopedic Surgery

## 2023-03-10 DIAGNOSIS — Z87891 Personal history of nicotine dependence: Secondary | ICD-10-CM | POA: Diagnosis not present

## 2023-03-10 DIAGNOSIS — M1611 Unilateral primary osteoarthritis, right hip: Secondary | ICD-10-CM | POA: Diagnosis not present

## 2023-03-10 DIAGNOSIS — J45909 Unspecified asthma, uncomplicated: Secondary | ICD-10-CM | POA: Diagnosis not present

## 2023-03-10 DIAGNOSIS — E039 Hypothyroidism, unspecified: Secondary | ICD-10-CM | POA: Diagnosis not present

## 2023-03-10 DIAGNOSIS — Z7984 Long term (current) use of oral hypoglycemic drugs: Secondary | ICD-10-CM | POA: Diagnosis not present

## 2023-03-10 DIAGNOSIS — I10 Essential (primary) hypertension: Secondary | ICD-10-CM | POA: Diagnosis not present

## 2023-03-10 DIAGNOSIS — Z91040 Latex allergy status: Secondary | ICD-10-CM | POA: Diagnosis not present

## 2023-03-10 DIAGNOSIS — E119 Type 2 diabetes mellitus without complications: Secondary | ICD-10-CM | POA: Diagnosis not present

## 2023-03-10 DIAGNOSIS — Z79899 Other long term (current) drug therapy: Secondary | ICD-10-CM | POA: Diagnosis not present

## 2023-03-10 LAB — BASIC METABOLIC PANEL
Anion gap: 5 (ref 5–15)
BUN: 17 mg/dL (ref 6–20)
CO2: 25 mmol/L (ref 22–32)
Calcium: 8.8 mg/dL — ABNORMAL LOW (ref 8.9–10.3)
Chloride: 105 mmol/L (ref 98–111)
Creatinine, Ser: 1.02 mg/dL — ABNORMAL HIGH (ref 0.44–1.00)
GFR, Estimated: >60 mL/min (ref >60)
Glucose, Bld: 142 mg/dL — ABNORMAL HIGH (ref 70–99)
Potassium: 3.6 mmol/L (ref 3.5–5.1)
Sodium: 135 mmol/L (ref 135–145)

## 2023-03-10 LAB — CBC
HCT: 28.3 % — ABNORMAL LOW (ref 36.0–46.0)
Hemoglobin: 9.5 g/dL — ABNORMAL LOW (ref 12.0–15.0)
MCH: 31.1 pg (ref 26.0–34.0)
MCHC: 33.6 g/dL (ref 30.0–36.0)
MCV: 92.8 fL (ref 80.0–100.0)
Platelets: 308 10*3/uL (ref 150–400)
RBC: 3.05 MIL/uL — ABNORMAL LOW (ref 3.87–5.11)
RDW: 12.1 % (ref 11.5–15.5)
WBC: 12 10*3/uL — ABNORMAL HIGH (ref 4.0–10.5)
nRBC: 0 % (ref 0.0–0.2)

## 2023-03-10 LAB — GLUCOSE, CAPILLARY: Glucose-Capillary: 134 mg/dL — ABNORMAL HIGH (ref 70–99)

## 2023-03-10 MED ORDER — ASPIRIN 81 MG PO CHEW
CHEWABLE_TABLET | ORAL | Status: AC
  Filled 2023-03-10: qty 1

## 2023-03-10 MED ORDER — TRAMADOL HCL 50 MG PO TABS: 50.0000 mg | ORAL_TABLET | Freq: Four times a day (QID) | ORAL | 0 refills | Status: DC | PRN

## 2023-03-10 MED ORDER — ACETAMINOPHEN 500 MG PO TABS
ORAL_TABLET | ORAL | Status: AC
  Filled 2023-03-10: qty 2

## 2023-03-10 MED ORDER — OXYCODONE HCL 5 MG PO TABS: 2.5000 mg | ORAL_TABLET | Freq: Four times a day (QID) | ORAL | Status: DC | PRN

## 2023-03-10 MED ORDER — PANTOPRAZOLE SODIUM 40 MG PO TBEC
DELAYED_RELEASE_TABLET | ORAL | Status: AC
  Filled 2023-03-10: qty 1

## 2023-03-10 MED ORDER — TRAMADOL HCL 50 MG PO TABS
ORAL_TABLET | ORAL | Status: AC
  Filled 2023-03-10: qty 1

## 2023-03-10 MED ORDER — ENOXAPARIN SODIUM 40 MG/0.4ML IJ SOSY
PREFILLED_SYRINGE | INTRAMUSCULAR | Status: AC
  Filled 2023-03-10: qty 0.4

## 2023-03-10 MED ORDER — ENOXAPARIN SODIUM 40 MG/0.4ML IJ SOSY: 40.0000 mg | PREFILLED_SYRINGE | INTRAMUSCULAR | 0 refills | Status: DC

## 2023-03-10 MED ORDER — DOCUSATE SODIUM 100 MG PO CAPS
ORAL_CAPSULE | ORAL | Status: AC
  Filled 2023-03-10: qty 1

## 2023-03-10 MED ORDER — OXYCODONE HCL 5 MG PO TABS
ORAL_TABLET | ORAL | Status: AC
  Filled 2023-03-10: qty 1

## 2023-03-10 MED ORDER — OXYCODONE HCL 5 MG PO TABS: 2.5000 mg | ORAL_TABLET | Freq: Four times a day (QID) | ORAL | 0 refills | Status: DC | PRN

## 2023-03-10 MED ORDER — SODIUM CHLORIDE 0.9 % IV BOLUS: 500.0000 mL | Freq: Once | INTRAVENOUS | Status: DC

## 2023-03-10 MED ORDER — HYDROCODONE-ACETAMINOPHEN 5-325 MG PO TABS
ORAL_TABLET | ORAL | Status: AC
  Filled 2023-03-10: qty 2

## 2023-03-10 MED ORDER — ACETAMINOPHEN 500 MG PO TABS: 1000.0000 mg | ORAL_TABLET | Freq: Three times a day (TID) | ORAL | 0 refills | Status: DC

## 2023-03-10 NOTE — Progress Notes (Signed)
DISCHARGE NOTE:  Pt given discharge instructions and verbalized understanding. TED hose on both legs. Pt wheeled to car by staff. Family providing transportation.

## 2023-03-10 NOTE — Discharge Summary (Signed)
Physician Discharge Summary  Patient ID: Kathleen Carr MRN: [id number] DOB/AGE: [birth year] 52 y.o.  Admit date: 03/09/2023 Discharge date: 03/10/2023  Admission Diagnoses:  Osteoarthritis of right hip [M16.11]   Discharge Diagnoses: Patient Active Problem List   Diagnosis Date Noted   Osteoarthritis of right hip 03/09/2023   Acute renal failure (ARF) (HCC) 12/31/2022   Polyarthritis 12/31/2022   Femoroacetabular impingement of right hip 08/19/2022   Urinary frequency 06/28/2022   S/P TAH (total abdominal hysterectomy) 06/27/2022   Trigger finger, left middle finger 06/20/2022   Laceration of right thigh 05/21/2022   Carpal tunnel syndrome, left upper limb    Smoker 04/02/2022   Tobacco abuse 04/02/2022   Long-term use of high-risk medication 04/02/2022   Contact with and (suspected) exposure to covid-19 04/02/2022   Situational anxiety 11/21/2021   Vitamin D deficiency 11/21/2021   Hypothyroidism 11/21/2021   Carpal tunnel syndrome, right upper limb 11/02/2021   Bipolar disorder, currently in remission (HCC) 07/21/2021   Controlled diabetes mellitus type 2 with complications (HCC) 07/21/2021   Gastritis 03/06/2021   Preoperative evaluation to rule out surgical contraindication 02/04/2021   Leukocytosis 01/21/2021   Insomnia 01/21/2021   Chronic midline low back pain 08/05/2020   Overweight (BMI 25.0-29.9) 08/05/2020    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  female who was admitted 03/09/2023 with a diagnosis of Osteoarthritis of right hip and went to the operating room on 03/09/2023 and underwent the above named procedures.    Surgeries: Procedure(s): TOTAL HIP ARTHROPLASTY ANTERIOR APPROACH on 03/09/2023 Patient tolerated the surgery well. Taken to PACU where she was stabilized and then transferred to the orthopedic floor.  Started on Lovenox 40 mg q 24 hrs. TEDs and SCDs applied bilaterally. Heels elevated on bed. No evidence of DVT. Negative Homan. Physical therapy started on day #1 for gait training and transfer. OT started day #1 for ADL and assisted devices. Patient's IV was d/c on day #1. Patient was able to safely and independently complete all PT goals. PT recommending discharge to home.   On post op day #1 patient was stable and ready for discharge to home with HHPT.  Implants: Cup: Trident Tritanium Clusterhole 48mm w/ x 2 screws    Liner: Neutral X3 Poly 36/D  Stem: Insignia #2 High offset  Head:Biolox ceramic 36 +64mm     She was given perioperative antibiotics:  Anti-infectives (From admission, onward)    Start     Dose/Rate Route Frequency Ordered Stop   03/09/23 1500  ceFAZolin (ANCEF) IVPB 2g/100 mL premix        2 g 200 mL/hr over 30 Minutes Intravenous Every 6 hours 03/09/23 1246 03/09/23 2304   03/09/23 0600  ceFAZolin (ANCEF) IVPB 2g/100 mL premix        2 g 200 mL/hr over 30 Minutes Intravenous On call to O.R. 03/08/23  2152 03/09/23 0920     .  She was given sequential compression devices, early ambulation, and Lovenox TEDs for DVT prophylaxis.  She benefited maximally from the hospital stay and there were no complications.    Recent vital signs:  Vitals:   03/09/23 2205 03/10/23 0451  BP: 104/65   Pulse: 75 68  Resp: 18 16  Temp: 98.7 F (37.1 C) 98 F (36.7 C)  SpO2: 98%     Recent laboratory studies:  Lab Results  Component Value Date   HGB 9.5 (L) 03/10/2023   HGB 12.7 02/24/2023   HGB 12.1  12/31/2022   Lab Results  Component Value Date   WBC 12.0 (H) 03/10/2023   PLT 308 03/10/2023   No results found for: "INR" Lab Results  Component Value Date   NA 135 03/10/2023   K 3.6 03/10/2023   CL 105 03/10/2023   CO2 25 03/10/2023   BUN 17 03/10/2023   CREATININE 1.02 (H) 03/10/2023   GLUCOSE 142 (H) 03/10/2023    Discharge Medications:   Allergies as of 03/10/2023       Reactions   Dust Mite Mixed Allergen Ext [mite (d. Farinae)]    Respiratory distresss   Other Hives   Allergy to Hickory, walnut and Birch trees and all grasses and allergic to Rabbits- patient reports anaphylactic    Sulfa Antibiotics Hives   Latex Itching   Misc. Sulfonamide Containing Compounds Hives   Nsaids    Acute renal failure        Medication List     TAKE these medications    acetaminophen 500 MG tablet Commonly known as: TYLENOL Take 2 tablets (1,000 mg total) by mouth every 8 (eight) hours.   albuterol 108 (90 Base) MCG/ACT inhaler Commonly known as: VENTOLIN HFA Inhale 1-2 puffs into the lungs every 6 (six) hours as needed for wheezing or shortness of breath.   ALPRAZolam 0.25 MG tablet Commonly known as: XANAX TAKE 1 TO 2 TABLETS BY MOUTH 2 TIMES DAILY AS NEEDED FOR ANXIETY (WILL CAUSE DROWSINESS)   aspirin 81 MG chewable tablet Chew 81 mg by mouth daily.   Colace 100 MG capsule Generic drug: docusate sodium Take 1 capsule by mouth daily.   enoxaparin 40 MG/0.4ML injection Commonly known as: LOVENOX Inject 0.4 mLs (40 mg total) into the skin daily for 14 days.   levothyroxine 50 MCG tablet Commonly known as: SYNTHROID TAKE 1 TABLET BY MOUTH DAILY BEFORE BREAKFAST.   loratadine 10 MG tablet Commonly known as: CLARITIN Take 10 mg by mouth daily as needed.   metFORMIN 1000 MG tablet Commonly known as: GLUCOPHAGE TAKE 1 TABLET BY MOUTH TWICE A DAY WITH A MEAL   ondansetron 4 MG tablet Commonly known as: ZOFRAN Take 1 tablet (4 mg total) by mouth every 6  (six) hours as needed for nausea.   oxybutynin 15 MG 24 hr tablet Commonly known as: DITROPAN XL Take 1 tablet (15 mg total) by mouth at bedtime.   oxyCODONE 5 MG immediate release tablet Commonly known as: Oxy IR/ROXICODONE Take 0.5-1 tablets (2.5-5 mg total) by mouth every 6 (six) hours as needed for severe pain or breakthrough pain. What changed:  how much to take when to take this reasons to take this   pantoprazole 40 MG tablet Commonly known as: PROTONIX Take 1 tablet (40 mg total) by mouth daily.   QUEtiapine 300 MG tablet Commonly known as: SEROQUEL TAKE 1 TABLET BY MOUTH EVERYDAY AT BEDTIME  sertraline 100 MG tablet Commonly known as: ZOLOFT TAKE 1 TABLET BY MOUTH DAILY   traMADol 50 MG tablet Commonly known as: ULTRAM Take 1 tablet (50 mg total) by mouth every 6 (six) hours as needed for moderate pain.   zolpidem 5 MG tablet Commonly known as: AMBIEN TAKE 1 TABLET BY MOUTH AT BEDTIME AS NEEDED FOR SLEEP        Diagnostic Studies: DG HIP UNILAT WITH PELVIS 1V RIGHT  Result Date: 03/09/2023 CLINICAL DATA:  Fluoroscopic assistance for right hip arthroplasty EXAM: DG HIP (WITH OR WITHOUT PELVIS) 1V RIGHT COMPARISON:  Previous studies including the arthroscopy done on 08/19/2022 FINDINGS: Fluoroscopic images show right hip arthroplasty. No displaced fracture or dislocation is seen. Fluoroscopic time 23 seconds. Radiation dose 2.9 mGy. IMPRESSION: Fluoroscopic assistance was provided for right hip arthroplasty. Electronically Signed   By: Ernie Avena M.D.   On: 03/09/2023 10:58   DG C-Arm 1-60 Min-No Report  Result Date: 03/09/2023 Fluoroscopy was utilized by the requesting physician.  No radiographic interpretation.   DG C-Arm 1-60 Min-No Report  Result Date: 03/09/2023 Fluoroscopy was utilized by the requesting physician.  No radiographic interpretation.   MR THORACIC SPINE WO CONTRAST  Result Date: 02/25/2023 CLINICAL DATA:  Assess for stenosis prior  to spinal cord stimulator placement. Low back pain and right leg pain. EXAM: MRI THORACIC SPINE WITHOUT CONTRAST TECHNIQUE: Multiplanar, multisequence MR imaging of the thoracic spine was performed. No intravenous contrast was administered. COMPARISON:  No prior thoracic imaging. FINDINGS: Alignment:  No malalignment. Vertebrae: No fracture or focal bone lesion. Cord:  No primary cord lesion.  See below regarding stenosis. Paraspinal and other soft tissues: No paravertebral abnormality seen other than tiny pleural effusions layering dependently. Disc levels: No disc level finding of significance from T1-2 through T2-3. T3-4: Shallow right posterolateral disc herniation without compression of the cord or foraminal extension. T4-5: Shallow broad-based disc herniation narrows the ventral subarachnoid space but does not compress the cord. Subarachnoid space present dorsal to the cord. T5-6: Small central disc protrusion narrows the ventral subarachnoid space. No neural compression. T6-7: Broad-based disc herniation more prominent towards the right. Narrowing of the ventral subarachnoid space but no compression of the cord. Foramina sufficiently patent. T7-8: Large central disc herniation effaces the ventral subarachnoid space and indents the ventral cord. Only a small amount of subarachnoid spaces present dorsal to the cord. T8-9: Shallow disc protrusion narrows the ventral subarachnoid space but does not compress the cord. Mild narrowing of the subarachnoid space dorsal to the cord. T9-10: Shallow disc protrusion more prominent towards the right. Mild narrowing of the ventral subarachnoid space on the right but no compression of the cord. Ample subarachnoid space present dorsal to the cord. T10-11: No disc abnormality. Facet degeneration and hypertrophy on the right. T11-12: Normal interspace. T12-L1: Normal interspace. IMPRESSION: 1. 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 Only a small amount of subarachnoid spaces present dorsal to the cord. 2. Shallow disc herniations at T3-4, T4-5, T5-6, T6-7, T8-9 and T9-10. Narrowing of the ventral subarachnoid space but no compression of the cord at those levels. 3. Hypertrophic degenerative change of the right facet at T10-11 with some narrowing of the dorsal subarachnoid space on the right at that level. Electronically Signed   By: Paulina Fusi M.D.   On: 02/25/2023 14:09   DG Chest 2 View  Result Date: 02/17/2023 CLINICAL DATA:  tobacco abuse pre operative evaluation for  hip replacement EXAM: CHEST - 2 VIEW COMPARISON:  01/07/2022. FINDINGS: The heart size and mediastinal contours are within normal limits. Both lungs are clear. No pneumothorax or pleural effusion. There are thoracic degenerative changes. IMPRESSION: No active cardiopulmonary disease. Electronically Signed   By: Layla Maw M.D.   On: 02/17/2023 10:20    Disposition:      Follow-up Information     Evon Slack, PA-C Follow up in 2 week(s).   Specialties: Orthopedic Surgery, Emergency Medicine Contact information: [address] [place] [address] [phone]                  Signed: Patience Musca 03/10/2023, 7:57 AM

## 2023-03-10 NOTE — Progress Notes (Signed)
Per Dr Audelia Acton pt ok to d/c

## 2023-03-10 NOTE — Progress Notes (Signed)
BP now 128/71, PA Cranston Neighbor notified.

## 2023-03-10 NOTE — Anesthesia Postprocedure Evaluation (Signed)
Anesthesia Post Note  Patient: Kathleen Carr  Procedure(s) Performed: TOTAL HIP ARTHROPLASTY ANTERIOR APPROACH (Right: Hip)  Patient location: Preop Area Room 22. Anesthesia Type: Spinal Level of consciousness: awake Pain management: pain level controlled Vital Signs Assessment: post-procedure vital signs reviewed and stable Respiratory status: spontaneous breathing Cardiovascular status: stable Postop Assessment: no headache Anesthetic complications: no Comments: Pt resting comfortably in bed. Pt pain level 3/10. Concerns that pain my not be controled later in day, but tolerable currently. Vital signs from 0451 stable. BP from 2205 stable.    There were no known notable events for this encounter.   Last Vitals:  Vitals:   03/09/23 2205 03/10/23 0451  BP: 104/65   Pulse: 75 68  Resp: 18 16  Temp: 37.1 C 36.7 C  SpO2: 98%     Last Pain:  Vitals:   03/10/23 0722  TempSrc:   PainSc: Asleep                 Merlene Pulling

## 2023-03-10 NOTE — Discharge Instructions (Signed)
Instructions after Anterior Total Hip Replacement        Dr. Zachary Aberman, Jr., M.D.      Dept. of Orthopaedics & Sports Medicine  [facility]  [address]  [place], [place]  [address]  Phone: [phone]   Fax: [phone]    DIET: Drink plenty of non-alcoholic fluids. Resume your normal diet. Include foods high in fiber.  ACTIVITY:  You may use crutches or a walker with weight-bearing as tolerated, unless instructed otherwise. You may be weaned off of the walker or crutches by your Physical Therapist.  Continue doing gentle exercises. Exercising will reduce the pain and swelling, increase motion, and prevent muscle weakness.   Please continue to use the TED compression stockings for 2 weeks. You may remove the stockings at night, but should reapply them in the morning. Do not drive or operate any equipment until instructed.  WOUND CARE:  Continue to use ice packs periodically to reduce pain and swelling. You may shower with honeycomb dressing 3 days after your surgery. Do not submerge incision site under water. Remove honeycomb dressing 7 days after surgery and allow dermabond to fall off on its own.   MEDICATIONS: You may resume your regular medications. Please take the pain medication as prescribed on the medication list. Do not take pain medication on an empty stomach. You have been given a prescription for a blood thinner to prevent blood clots. Please take the medication as instructed. (NOTE: After completing a 2 week course of Lovenox, take one Enteric-coated 81 mg aspirin twice a day for 3 additional weeks.) Pain medications and iron supplements can cause constipation. Use a stool softener (Senokot or Colace) on a daily basis and a laxative (dulcolax or miralax) as needed. Do not drive or drink alcoholic beverages when taking pain medications.  POSTOPERATIVE CONSTIPATION PROTOCOL Constipation - defined medically as fewer than three stools per week and  severe constipation as less than one stool per week.  One of the most common issues patients have following surgery is constipation.  Even if you have a regular bowel pattern at home, your normal regimen is likely to be disrupted due to multiple reasons following surgery.  Combination of anesthesia, postoperative narcotics, change in appetite and fluid intake all can affect your bowels.  In order to avoid complications following surgery, here are some recommendations in order to help you during your recovery period.  Colace (docusate) - Pick up an over-the-counter form of Colace or another stool softener and take twice a day as long as you are requiring postoperative pain medications.  Take with a full glass of water daily.  If you experience loose stools or diarrhea, hold the colace until you stool forms back up.  If your symptoms do not get better within 1 week or if they get worse, check with your doctor.  Dulcolax (bisacodyl) - Pick up over-the-counter and take as directed by the product packaging as needed to assist with the movement of your bowels.  Take with a full glass of water.  Use this product as needed if not relieved by Colace only.   MiraLax (polyethylene glycol) - Pick up over-the-counter to have on hand.  MiraLax is a solution that will increase the amount of water in your bowels to assist with bowel movements.  Take as directed and can mix with a glass of water, juice, soda, coffee, or tea.  Take if you go more than two days without a movement. Do not use MiraLax more than   once per day. Call your doctor if you are still constipated or irregular after using this medication for 7 days in a row.  If you continue to have problems with postoperative constipation, please contact the office for further assistance and recommendations.  If you experience "the worst abdominal pain ever" or develop nausea or vomiting, please contact the office immediatly for further recommendations for  treatment.   CALL THE OFFICE FOR: Temperature above 101 degrees Excessive bleeding or drainage on the dressing. Excessive swelling, coldness, or paleness of the toes. Persistent nausea and vomiting.  FOLLOW-UP:  You should have an appointment to return to the office in 2 weeks after surgery. Arrangements have been made for continuation of Physical Therapy (either home therapy or outpatient therapy).  

## 2023-03-10 NOTE — Progress Notes (Signed)
Pts BG 134, refusing Insulin. PA, Cranston Neighbor notified. No orders received.

## 2023-03-10 NOTE — Plan of Care (Signed)
  Problem: Skin Integrity: Goal: Risk for impaired skin integrity will decrease Outcome: Progressing   Problem: Activity: Goal: Ability to avoid complications of mobility impairment will improve Outcome: Progressing   Problem: Pain Management: Goal: Pain level will decrease with appropriate interventions Outcome: Progressing

## 2023-03-10 NOTE — Progress Notes (Addendum)
   Subjective: 1 Day Post-Op Procedure(s) (LRB): TOTAL HIP ARTHROPLASTY ANTERIOR APPROACH (Right) Patient reports pain as moderate and severe.   Patient is well, and has had no acute complaints or problems Denies any CP, SOB, ABD pain. We will continue therapy today.  Plan is to go Home after hospital stay.  Objective: Vital signs in last 24 hours: Temp:  [97.2 F (36.2 C)-98.8 F (37.1 C)] 98 F (36.7 C) (05/14 0451) Pulse Rate:  [65-83] 68 (05/14 0451) Resp:  [11-20] 16 (05/14 0451) BP: (89-118)/(60-77) 104/65 (05/13 2205) SpO2:  [88 %-100 %] 98 % (05/13 2205) Weight:  [77.6 kg] 77.6 kg (05/13 1200)  Intake/Output from previous day: 05/13 0701 - 05/14 0700 In: 2503.3 [I.V.:2293.3; IV Piggyback:210] Out: 700 [Urine:400; Blood:300] Intake/Output this shift: No intake/output data recorded.  Recent Labs    03/10/23 0620  HGB 9.5*   Recent Labs    03/10/23 0620  WBC 12.0*  RBC 3.05*  HCT 28.3*  PLT 308   Recent Labs    03/10/23 0620  NA 135  K 3.6  CL 105  CO2 25  BUN 17  CREATININE 1.02*  GLUCOSE 142*  CALCIUM 8.8*   No results for input(s): "LABPT", "INR" in the last 72 hours.  EXAM General - Patient is Alert, Appropriate, and Oriented Extremity - Neurovascular intact Sensation intact distally Dorsiflexion/Plantar flexion intact No cellulitis present Compartment soft Dressing - dressing C/D/I and no drainage Motor Function - intact, moving foot and toes well on exam.   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   Weight as of this encounter: 77.6 kg. Advance diet Up with therapy Pain moderate to severe, with norco/tramadol. Will dc norco and start oxycodone.   VSS  Labs stable  CM to assist with discharge to home with HHPT today pending safe completion of PT goals  DVT Prophylaxis - Lovenox, TED hose, and SCDs Weight-Bearing as tolerated to right leg   T. Cranston Neighbor, PA-C [facility] 03/10/2023, 7:50 AM   Patient seen and examined, agree with above plan.  The patient is doing well status post right anterior total hip arthroplasty, no concerns at this time.  Pain is controlled.  Discussed DVT prophylaxis, pain medication use, and safe transition to home.  All questions answered the patient agrees with above plan will go home after clears PT.   Reinaldo Berber MD

## 2023-03-10 NOTE — Progress Notes (Signed)
Patient is not able to walk the distance required to go the bathroom, or she is unable to safely negotiate stairs required to access the bathroom.  A 3in1 BSC will alleviate this problem.       T. Chris Annick Dimaio, PA-C [facility] 

## 2023-03-10 NOTE — Progress Notes (Signed)
Physical Therapy Treatment Patient Details Name: Kathleen Carr MRN: [id number] DOB: 01-18-1971 Today's Date: 03/10/2023   History of Present Illness Pt admitted for R THR and is POD 0 at time of evaluation. History includes bipolar, depression, DM, GERD, HTN, and PTSD. Underwent hip arthroscopy for labral tear in 2023.    PT Comments    Patient alert, oriented to self and standing with family at bedside upon PT entrance, reported 4/10 hip pain. Pt agreeable to return to sitting for BP reading due to low BP per RN. Checked several times during session, 89/62 after standing, ambulating, and using the commode, with sitting it returned to 106/82. Pt without complaints. She was able to transfer with RW and supervision, ambulate ~144ft, and perform stair navigation with CGA, assist for RW management. She verbalized proper technique to maximize safety and family present for training. Returned to room with needs in reach. The patient would benefit from further skilled PT intervention to continue to progress towards goals. Recommendation remains appropriate.     Recommendations for follow up therapy are one component of a multi-disciplinary discharge planning process, led by the attending physician.  Recommendations may be updated based on patient status, additional functional criteria and insurance authorization.  Follow Up Recommendations       Assistance Recommended at Discharge Set up Supervision/Assistance  Patient can return home with the following A little help with walking and/or transfers   Equipment Recommendations  None recommended by PT    Recommendations for Other Services       Precautions / Restrictions Precautions Precautions: Anterior Hip Precaution Booklet Issued: Yes (comment) Restrictions Weight Bearing Restrictions: Yes RLE Weight Bearing: Weight bearing as tolerated     Mobility  Bed Mobility               General bed mobility comments: pt standing with family  upon PT entrance    Transfers Overall transfer level: Needs assistance Equipment used: Rolling walker (2 wheels) Transfers: Sit to/from Stand Sit to Stand: Supervision           General transfer comment: cued for safe hand placement    Ambulation/Gait Ambulation/Gait assistance: Modified independent (Device/Increase time) Gait Distance (Feet): 180 Feet Assistive device: Rolling walker (2 wheels) Gait Pattern/deviations: Step-through pattern           Stairs Stairs: Yes Stairs assistance: Min guard Stair Management: No rails, Step to pattern, Backwards, With walker Number of Stairs: 2     Wheelchair Mobility    Modified Rankin (Stroke Patients Only)       Balance Overall balance assessment: Needs assistance Sitting-balance support: Feet supported Sitting balance-Leahy Scale: Good       Standing balance-Leahy Scale: Good                              Cognition Arousal/Alertness: Awake/alert Behavior During Therapy: WFL for tasks assessed/performed Overall Cognitive Status: Within Functional Limits for tasks assessed                                          Exercises Other Exercises Other Exercises: able to ambulate to bathroom and perform pericare, supervision    General Comments        Pertinent Vitals/Pain Pain Assessment Pain Assessment: 0-10 Pain Score: 4  Pain Location: hip Pain Descriptors / Indicators: Sore Pain Intervention(s):  Limited activity within patient's tolerance, Monitored during session, Repositioned, Premedicated before session    Home Living                          Prior Function            PT Goals (current goals can now be found in the care plan section) Progress towards PT goals: Progressing toward goals    Frequency    BID      PT Plan Current plan remains appropriate    Co-evaluation              AM-PAC PT "6 Clicks" Mobility   Outcome Measure  Help  needed turning from your back to your side while in a flat bed without using bedrails?: None Help needed moving from lying on your back to sitting on the side of a flat bed without using bedrails?: None Help needed moving to and from a bed to a chair (including a wheelchair)?: None Help needed standing up from a chair using your arms (e.g., wheelchair or bedside chair)?: None Help needed to walk in hospital room?: A Little Help needed climbing 3-5 steps with a railing? : A Little 6 Click Score: 22    End of Session Equipment Utilized During Treatment: Gait belt Activity Tolerance: Patient tolerated treatment well Patient left: in chair;with family/visitor present Nurse Communication: Mobility status PT Visit Diagnosis: Muscle weakness (generalized) (M62.81);Difficulty in walking, not elsewhere classified (R26.2)     Time: 1610-9604 PT Time Calculation (min) (ACUTE ONLY): 20 min  Charges:  $Therapeutic Activity: 8-22 mins                     Olga Coaster PT, DPT 11:17 AM,03/10/23

## 2023-03-11 ENCOUNTER — Telehealth: Payer: Self-pay

## 2023-03-11 DIAGNOSIS — E119 Type 2 diabetes mellitus without complications: Secondary | ICD-10-CM | POA: Diagnosis not present

## 2023-03-11 DIAGNOSIS — F418 Other specified anxiety disorders: Secondary | ICD-10-CM | POA: Diagnosis not present

## 2023-03-11 DIAGNOSIS — J45909 Unspecified asthma, uncomplicated: Secondary | ICD-10-CM | POA: Diagnosis not present

## 2023-03-11 DIAGNOSIS — K219 Gastro-esophageal reflux disease without esophagitis: Secondary | ICD-10-CM | POA: Diagnosis not present

## 2023-03-11 DIAGNOSIS — Z79891 Long term (current) use of opiate analgesic: Secondary | ICD-10-CM | POA: Diagnosis not present

## 2023-03-11 DIAGNOSIS — Z7982 Long term (current) use of aspirin: Secondary | ICD-10-CM | POA: Diagnosis not present

## 2023-03-11 DIAGNOSIS — M545 Low back pain, unspecified: Secondary | ICD-10-CM | POA: Diagnosis not present

## 2023-03-11 DIAGNOSIS — F3170 Bipolar disorder, currently in remission, most recent episode unspecified: Secondary | ICD-10-CM | POA: Diagnosis not present

## 2023-03-11 DIAGNOSIS — I10 Essential (primary) hypertension: Secondary | ICD-10-CM | POA: Diagnosis not present

## 2023-03-11 DIAGNOSIS — E663 Overweight: Secondary | ICD-10-CM | POA: Diagnosis not present

## 2023-03-11 DIAGNOSIS — M130 Polyarthritis, unspecified: Secondary | ICD-10-CM | POA: Diagnosis not present

## 2023-03-11 DIAGNOSIS — G5603 Carpal tunnel syndrome, bilateral upper limbs: Secondary | ICD-10-CM | POA: Diagnosis not present

## 2023-03-11 DIAGNOSIS — G4700 Insomnia, unspecified: Secondary | ICD-10-CM | POA: Diagnosis not present

## 2023-03-11 DIAGNOSIS — E559 Vitamin D deficiency, unspecified: Secondary | ICD-10-CM | POA: Diagnosis not present

## 2023-03-11 DIAGNOSIS — Z96641 Presence of right artificial hip joint: Secondary | ICD-10-CM | POA: Diagnosis not present

## 2023-03-11 DIAGNOSIS — Z7984 Long term (current) use of oral hypoglycemic drugs: Secondary | ICD-10-CM | POA: Diagnosis not present

## 2023-03-11 DIAGNOSIS — Z471 Aftercare following joint replacement surgery: Secondary | ICD-10-CM | POA: Diagnosis not present

## 2023-03-11 DIAGNOSIS — F4310 Post-traumatic stress disorder, unspecified: Secondary | ICD-10-CM | POA: Diagnosis not present

## 2023-03-11 DIAGNOSIS — G8929 Other chronic pain: Secondary | ICD-10-CM | POA: Diagnosis not present

## 2023-03-11 LAB — SURGICAL PATHOLOGY

## 2023-03-11 NOTE — Transitions of Care (Post Inpatient/ED Visit) (Signed)
03/11/2023  Name: Kathleen Carr MRN: [id number] DOB: 1971-07-14  Today's TOC FU Call Status: Today's TOC FU Call Status:: Successful TOC FU Call Competed TOC FU Call Complete Date: 03/11/23  Transition Care Management Follow-up Telephone Call Date of Discharge: 03/10/23 Discharge Facility: [facility]) Type of Discharge: Inpatient Admission Primary Inpatient Discharge Diagnosis:: right total hip replacement How have you been since you were released from the hospital?: Better Any questions or concerns?: No  Items Reviewed: Did you receive and understand the discharge instructions provided?: Yes Medications obtained,verified, and reconciled?: Yes (Medications Reviewed) Any new allergies since your discharge?: No Dietary orders reviewed?: Yes Do you have support at home?: Yes People in Home: spouse  Medications Reviewed Today: Medications Reviewed Today     Reviewed by Karena Addison, LPN (Licensed Practical Nurse) on 03/11/23 at [phone]  Med List Status: <None>   Medication Order Taking? Sig Documenting Provider Last Dose Status Informant  acetaminophen (TYLENOL) 500 MG tablet [id number] Yes Take 2 tablets (1,000 mg total) by mouth every 8 (eight) hours. Evon Slack, PA-C Taking Active   albuterol (VENTOLIN HFA) 108 (90 Base) MCG/ACT inhaler [id number] Yes Inhale 1-2 puffs into the lungs every 6 (six) hours as needed for wheezing or shortness of breath. [provider] Taking Active   ALPRAZolam Prudy Feeler) 0.25 MG tablet [id number] Yes TAKE 1 TO 2 TABLETS BY MOUTH 2 TIMES DAILY AS NEEDED FOR ANXIETY (WILL CAUSE DROWSINESS) Sherlene Shams, MD Taking Active   aspirin 81 MG chewable tablet [id number] Yes Chew 81 mg by mouth daily. [provider] Taking Active Self  docusate sodium (COLACE) 100 MG capsule [id number] Yes Take 1 capsule by mouth daily. [provider] Taking Active   enoxaparin (LOVENOX) 40 MG/0.4ML injection [id number]  Yes Inject 0.4 mLs (40 mg total) into the skin daily for 14 days. Evon Slack, PA-C Taking Active   levothyroxine (SYNTHROID) 50 MCG tablet [id number] Yes TAKE 1 TABLET BY MOUTH DAILY BEFORE BREAKFAST. Sherlene Shams, MD Taking Active   loratadine (CLARITIN) 10 MG tablet [id number] Yes Take 10 mg by mouth daily as needed. [provider] Taking Active   metFORMIN (GLUCOPHAGE) 1000 MG tablet [id number] Yes TAKE 1 TABLET BY MOUTH TWICE A DAY WITH A MEAL Sherlene Shams, MD Taking Active   ondansetron (ZOFRAN) 4 MG tablet [id number] Yes Take 1 tablet (4 mg total) by mouth every 6 (six) hours as needed for nausea. Sherlene Shams, MD Taking Active   oxybutynin (DITROPAN XL) 15 MG 24 hr tablet [id number] Yes Take 1 tablet (15 mg total) by mouth at bedtime. Sherlene Shams, MD Taking Active   oxyCODONE (OXY IR/ROXICODONE) 5 MG immediate release tablet [id number] Yes Take 0.5-1 tablets (2.5-5 mg total) by mouth every 6 (six) hours as needed for severe pain or breakthrough pain. Evon Slack, PA-C Taking Active   pantoprazole (PROTONIX) 40 MG tablet [id number] Yes Take 1 tablet (40 mg total) by mouth daily. Sherlene Shams, MD Taking Active   QUEtiapine (SEROQUEL) 300 MG tablet [id number] Yes TAKE 1 TABLET BY MOUTH EVERYDAY AT BEDTIME Sherlene Shams, MD Taking Active   sertraline (ZOLOFT) 100 MG tablet [id number] Yes TAKE 1 TABLET BY MOUTH DAILY Sherlene Shams, MD Taking Active     Discontinued 07/28/22 1620 (Change in therapy)     Discontinued 01/22/21 1321 (Not covered by the pt's insurance)   traMADol (ULTRAM) 50 MG tablet [id number] Yes Take 1 tablet (50 mg total)  by mouth every 6 (six) hours as needed for moderate pain. Evon Slack, PA-C Taking Active   zolpidem (AMBIEN) 5 MG tablet [id number] Yes TAKE 1 TABLET BY MOUTH AT BEDTIME AS NEEDED FOR SLEEP Sherlene Shams, MD Taking Active             Home Care and Equipment/Supplies: Were Home Health Services Ordered?: Yes Name of Home  Health Agency:: unknown Has Agency set up a time to come to your home?: Yes First Home Health Visit Date: 03/11/23 Any new equipment or medical supplies ordered?: NA  Functional Questionnaire: Do you need assistance with bathing/showering or dressing?: Yes Do you need assistance with meal preparation?: Yes Do you need assistance with eating?: No Do you have difficulty maintaining continence: No Do you need assistance with getting out of bed/getting out of a chair/moving?: Yes Do you have difficulty managing or taking your medications?: No  Follow up appointments reviewed: PCP Follow-up appointment confirmed?: No (no avail appt times, sent message to staff to schedule) MD Provider Line Number:[phone] Given: No Specialist Hospital Follow-up appointment confirmed?: Yes Date of Specialist follow-up appointment?: 03/24/23 Follow-Up Specialty Provider:: Dr Floyce Stakes ortho Do you need transportation to your follow-up appointment?: No Do you understand care options if your condition(s) worsen?: Yes-patient verbalized understanding    SIGNATURE Karena Addison, LPN [facility] Nurse Health Advisor Direct Dial [phone]

## 2023-03-13 DIAGNOSIS — G8929 Other chronic pain: Secondary | ICD-10-CM | POA: Diagnosis not present

## 2023-03-13 DIAGNOSIS — I10 Essential (primary) hypertension: Secondary | ICD-10-CM | POA: Diagnosis not present

## 2023-03-13 DIAGNOSIS — E559 Vitamin D deficiency, unspecified: Secondary | ICD-10-CM | POA: Diagnosis not present

## 2023-03-13 DIAGNOSIS — F4310 Post-traumatic stress disorder, unspecified: Secondary | ICD-10-CM | POA: Diagnosis not present

## 2023-03-13 DIAGNOSIS — M130 Polyarthritis, unspecified: Secondary | ICD-10-CM | POA: Diagnosis not present

## 2023-03-13 DIAGNOSIS — E663 Overweight: Secondary | ICD-10-CM | POA: Diagnosis not present

## 2023-03-13 DIAGNOSIS — Z7982 Long term (current) use of aspirin: Secondary | ICD-10-CM | POA: Diagnosis not present

## 2023-03-13 DIAGNOSIS — Z7984 Long term (current) use of oral hypoglycemic drugs: Secondary | ICD-10-CM | POA: Diagnosis not present

## 2023-03-13 DIAGNOSIS — G4700 Insomnia, unspecified: Secondary | ICD-10-CM | POA: Diagnosis not present

## 2023-03-13 DIAGNOSIS — M545 Low back pain, unspecified: Secondary | ICD-10-CM | POA: Diagnosis not present

## 2023-03-13 DIAGNOSIS — E119 Type 2 diabetes mellitus without complications: Secondary | ICD-10-CM | POA: Diagnosis not present

## 2023-03-13 DIAGNOSIS — Z471 Aftercare following joint replacement surgery: Secondary | ICD-10-CM | POA: Diagnosis not present

## 2023-03-13 DIAGNOSIS — J45909 Unspecified asthma, uncomplicated: Secondary | ICD-10-CM | POA: Diagnosis not present

## 2023-03-13 DIAGNOSIS — G5603 Carpal tunnel syndrome, bilateral upper limbs: Secondary | ICD-10-CM | POA: Diagnosis not present

## 2023-03-13 DIAGNOSIS — F3170 Bipolar disorder, currently in remission, most recent episode unspecified: Secondary | ICD-10-CM | POA: Diagnosis not present

## 2023-03-13 DIAGNOSIS — K219 Gastro-esophageal reflux disease without esophagitis: Secondary | ICD-10-CM | POA: Diagnosis not present

## 2023-03-13 DIAGNOSIS — Z96641 Presence of right artificial hip joint: Secondary | ICD-10-CM | POA: Diagnosis not present

## 2023-03-13 DIAGNOSIS — Z79891 Long term (current) use of opiate analgesic: Secondary | ICD-10-CM | POA: Diagnosis not present

## 2023-03-13 DIAGNOSIS — F418 Other specified anxiety disorders: Secondary | ICD-10-CM | POA: Diagnosis not present

## 2023-03-17 ENCOUNTER — Encounter: Payer: Self-pay | Admitting: Internal Medicine

## 2023-03-17 ENCOUNTER — Ambulatory Visit (INDEPENDENT_AMBULATORY_CARE_PROVIDER_SITE_OTHER): Payer: 59 | Admitting: Internal Medicine

## 2023-03-17 VITALS — BP 116/72 | HR 75 | Temp 98.6°F | Ht 64.0 in | Wt 165.0 lb

## 2023-03-17 DIAGNOSIS — Z7984 Long term (current) use of oral hypoglycemic drugs: Secondary | ICD-10-CM | POA: Diagnosis not present

## 2023-03-17 DIAGNOSIS — Z96641 Presence of right artificial hip joint: Secondary | ICD-10-CM | POA: Diagnosis not present

## 2023-03-17 DIAGNOSIS — F418 Other specified anxiety disorders: Secondary | ICD-10-CM

## 2023-03-17 DIAGNOSIS — Z09 Encounter for follow-up examination after completed treatment for conditions other than malignant neoplasm: Secondary | ICD-10-CM

## 2023-03-17 DIAGNOSIS — E118 Type 2 diabetes mellitus with unspecified complications: Secondary | ICD-10-CM

## 2023-03-17 DIAGNOSIS — D500 Iron deficiency anemia secondary to blood loss (chronic): Secondary | ICD-10-CM | POA: Diagnosis not present

## 2023-03-17 DIAGNOSIS — D72829 Elevated white blood cell count, unspecified: Secondary | ICD-10-CM | POA: Diagnosis not present

## 2023-03-17 NOTE — Patient Instructions (Addendum)
Start taking an otc iron supplement one tablet  daily  with orange juice  Repeat labs in mid June    try using Relaxium for insomnia  (as seen on TV commercials) . It is available through [employer] and contains all natural supplements:  Melatonin 5 mg  Chamomile 25 mg Passionflower extract 75 mg GABA 100 mg Ashwaganda extract 125 mg Magnesium citrate, glycinate, oxide (100 mg)  L tryptophan 500 mg Valerest (proprietary  ingredient ; probably valeria root extract)

## 2023-03-17 NOTE — Progress Notes (Unsigned)
Subjective:  Patient ID: Kathleen Carr, female    DOB: 03/29/1971  Age: 52 y.o. MRN: [id number]  CC: There were no encounter diagnoses.   HPI Kathleen Carr presents for  Chief Complaint  Patient presents with   Hospitalization Follow-up    Hip replacement (right)    S/p total  total  hip replacement on May 13 . Doing well,  hates using the lovenox once daily . For a total of 14 days,  .  Starts PT tomorrow.  Here today without  crutches.   Wants a handicapped placquard   Thoracic MRI reviewed (ordered by Myer Haff)  nerve stimulator  eval consdieration   on May 28   Leukocytosis , chronic  .    Anemia post op   Alprazolam   Outpatient Medications Prior to Visit  Medication Sig Dispense Refill   acetaminophen (TYLENOL) 500 MG tablet Take 2 tablets (1,000 mg total) by mouth every 8 (eight) hours. 30 tablet 0   albuterol (VENTOLIN HFA) 108 (90 Base) MCG/ACT inhaler Inhale 1-2 puffs into the lungs every 6 (six) hours as needed for wheezing or shortness of breath.     ALPRAZolam (XANAX) 0.25 MG tablet TAKE 1 TO 2 TABLETS BY MOUTH 2 TIMES DAILY AS NEEDED FOR ANXIETY (WILL CAUSE DROWSINESS) 30 tablet 1   aspirin 81 MG chewable tablet Chew 81 mg by mouth daily.     docusate sodium (COLACE) 100 MG capsule Take 1 capsule by mouth daily.     enoxaparin (LOVENOX) 40 MG/0.4ML injection Inject 0.4 mLs (40 mg total) into the skin daily for 14 days. 5.6 mL 0   levothyroxine (SYNTHROID) 50 MCG tablet TAKE 1 TABLET BY MOUTH DAILY BEFORE BREAKFAST. 90 tablet 1   loratadine (CLARITIN) 10 MG tablet Take 10 mg by mouth daily as needed.     metFORMIN (GLUCOPHAGE) 1000 MG tablet TAKE 1 TABLET BY MOUTH TWICE A DAY WITH A MEAL 60 tablet 1   ondansetron (ZOFRAN) 4 MG tablet Take 1 tablet (4 mg total) by mouth every 6 (six) hours as needed for nausea. 20 tablet 0   oxybutynin (DITROPAN XL) 15 MG 24 hr tablet Take 1 tablet (15 mg total) by mouth at bedtime. 30 tablet 2   oxyCODONE (OXY IR/ROXICODONE) 5 MG  immediate release tablet Take 0.5-1 tablets (2.5-5 mg total) by mouth every 6 (six) hours as needed for severe pain or breakthrough pain. 20 tablet 0   pantoprazole (PROTONIX) 40 MG tablet Take 1 tablet (40 mg total) by mouth daily. 90 tablet 1   QUEtiapine (SEROQUEL) 300 MG tablet TAKE 1 TABLET BY MOUTH EVERYDAY AT BEDTIME 90 tablet 1   sertraline (ZOLOFT) 100 MG tablet TAKE 1 TABLET BY MOUTH DAILY 90 tablet 1   traMADol (ULTRAM) 50 MG tablet Take 1 tablet (50 mg total) by mouth every 6 (six) hours as needed for moderate pain. 30 tablet 0   zolpidem (AMBIEN) 5 MG tablet TAKE 1 TABLET BY MOUTH AT BEDTIME AS NEEDED FOR SLEEP 30 tablet 5   No facility-administered medications prior to visit.    Review of Systems;  Patient denies headache, fevers, malaise, unintentional weight loss, skin rash, eye pain, sinus congestion and sinus pain, sore throat, dysphagia,  hemoptysis , cough, dyspnea, wheezing, chest pain, palpitations, orthopnea, edema, abdominal pain, nausea, melena, diarrhea, constipation, flank pain, dysuria, hematuria, urinary  Frequency, nocturia, numbness, tingling, seizures,  Focal weakness, Loss of consciousness,  Tremor, insomnia, depression, anxiety, and suicidal ideation.  Objective:  BP 116/72   Pulse 75   Temp 98.6 F (37 C) (Oral)   Ht 5\' 4"  (1.626 m)   Wt 165 lb (74.8 kg)   SpO2 98%   BMI 28.32 kg/m   BP Readings from Last 3 Encounters:  03/17/23 116/72  03/10/23 128/71  02/24/23 109/76    Wt Readings from Last 3 Encounters:  03/17/23 165 lb (74.8 kg)  03/09/23 171 lb (77.6 kg)  02/24/23 171 lb (77.6 kg)    Physical Exam  Lab Results  Component Value Date   HGBA1C 6.4 12/29/2022   HGBA1C 6.7 (A) 04/02/2022   HGBA1C 7.2 (H) 01/02/2022    Lab Results  Component Value Date   CREATININE 1.02 (H) 03/10/2023   CREATININE 0.91 02/24/2023   CREATININE 0.96 12/31/2022    Lab Results  Component Value Date   WBC 12.0 (H) 03/10/2023   HGB 9.5 (L)  03/10/2023   HCT 28.3 (L) 03/10/2023   PLT 308 03/10/2023   GLUCOSE 142 (H) 03/10/2023   CHOL 251 (H) 12/29/2022   TRIG 283.0 (H) 12/29/2022   HDL 46.90 12/29/2022   LDLDIRECT 152.0 12/29/2022   LDLCALC 106 (H) 08/02/2020   ALT 15 02/24/2023   AST 18 02/24/2023   NA 135 03/10/2023   K 3.6 03/10/2023   CL 105 03/10/2023   CREATININE 1.02 (H) 03/10/2023   BUN 17 03/10/2023   CO2 25 03/10/2023   TSH 3.17 12/29/2022   HGBA1C 6.4 12/29/2022   MICROALBUR 1.2 12/31/2022    DG HIP UNILAT WITH PELVIS 1V RIGHT  Result Date: 03/09/2023 CLINICAL DATA:  Fluoroscopic assistance for right hip arthroplasty EXAM: DG HIP (WITH OR WITHOUT PELVIS) 1V RIGHT COMPARISON:  Previous studies including the arthroscopy done on 08/19/2022 FINDINGS: Fluoroscopic images show right hip arthroplasty. No displaced fracture or dislocation is seen. Fluoroscopic time 23 seconds. Radiation dose 2.9 mGy. IMPRESSION: Fluoroscopic assistance was provided for right hip arthroplasty. Electronically Signed   By: Ernie Avena M.D.   On: 03/09/2023 10:58   DG C-Arm 1-60 Min-No Report  Result Date: 03/09/2023 Fluoroscopy was utilized by the requesting physician.  No radiographic interpretation.   DG C-Arm 1-60 Min-No Report  Result Date: 03/09/2023 Fluoroscopy was utilized by the requesting physician.  No radiographic interpretation.    Assessment & Plan:  .There are no diagnoses linked to this encounter.   I provided 30 minutes of face-to-face time during this encounter reviewing patient's last visit with me, patient's  most recent visit with cardiology,  nephrology,  and neurology,  recent surgical and non surgical procedures, previous  labs and imaging studies, counseling on currently addressed issues,  and post visit ordering to diagnostics and therapeutics .   Follow-up: No follow-ups on file.   Sherlene Shams, MD

## 2023-03-18 DIAGNOSIS — Z09 Encounter for follow-up examination after completed treatment for conditions other than malignant neoplasm: Secondary | ICD-10-CM | POA: Insufficient documentation

## 2023-03-18 DIAGNOSIS — Z96641 Presence of right artificial hip joint: Secondary | ICD-10-CM | POA: Diagnosis not present

## 2023-03-18 DIAGNOSIS — M6281 Muscle weakness (generalized): Secondary | ICD-10-CM | POA: Diagnosis not present

## 2023-03-18 DIAGNOSIS — M25651 Stiffness of right hip, not elsewhere classified: Secondary | ICD-10-CM | POA: Diagnosis not present

## 2023-03-18 DIAGNOSIS — G8929 Other chronic pain: Secondary | ICD-10-CM | POA: Diagnosis not present

## 2023-03-18 DIAGNOSIS — M25551 Pain in right hip: Secondary | ICD-10-CM | POA: Diagnosis not present

## 2023-03-18 NOTE — Assessment & Plan Note (Signed)
Persistent,  with low grade febrile episodes.  Will Recommend hematology referral to rule out CLL

## 2023-03-18 NOTE — Assessment & Plan Note (Signed)
Continue use of Xanax prn .The risks and benefits of  Chronic  benzodiazepine use were discussed with patient today including increased risk of dementia,  Addiction, and seizures if abruptly withdrawn  .  Patient was encouraged to reduce use of clonazepam gradually,  Starting with reduction of one dose to 1/2 tablet and use buspirone if needed  .

## 2023-03-18 NOTE — Assessment & Plan Note (Signed)
Patient is stable post discharge and has no new issues or questions about discharge plans at the visit today for hospital follow up. All labs , imaging studies and progress notes from admission were reviewed with patient today   

## 2023-03-18 NOTE — Assessment & Plan Note (Signed)
She is recovering quickly

## 2023-03-24 ENCOUNTER — Ambulatory Visit: Payer: 59 | Admitting: Student in an Organized Health Care Education/Training Program

## 2023-03-27 ENCOUNTER — Other Ambulatory Visit: Payer: Self-pay | Admitting: Internal Medicine

## 2023-04-01 ENCOUNTER — Other Ambulatory Visit: Payer: Self-pay | Admitting: Internal Medicine

## 2023-04-01 DIAGNOSIS — M25651 Stiffness of right hip, not elsewhere classified: Secondary | ICD-10-CM | POA: Diagnosis not present

## 2023-04-01 DIAGNOSIS — I10 Essential (primary) hypertension: Secondary | ICD-10-CM | POA: Diagnosis not present

## 2023-04-01 DIAGNOSIS — M25551 Pain in right hip: Secondary | ICD-10-CM | POA: Diagnosis not present

## 2023-04-01 DIAGNOSIS — Z720 Tobacco use: Secondary | ICD-10-CM | POA: Diagnosis not present

## 2023-04-01 DIAGNOSIS — E119 Type 2 diabetes mellitus without complications: Secondary | ICD-10-CM | POA: Diagnosis not present

## 2023-04-01 DIAGNOSIS — F3189 Other bipolar disorder: Secondary | ICD-10-CM | POA: Diagnosis not present

## 2023-04-01 DIAGNOSIS — M961 Postlaminectomy syndrome, not elsewhere classified: Secondary | ICD-10-CM | POA: Diagnosis not present

## 2023-04-01 DIAGNOSIS — G4709 Other insomnia: Secondary | ICD-10-CM | POA: Diagnosis not present

## 2023-04-01 DIAGNOSIS — M1991 Primary osteoarthritis, unspecified site: Secondary | ICD-10-CM | POA: Diagnosis not present

## 2023-04-01 DIAGNOSIS — Z716 Tobacco abuse counseling: Secondary | ICD-10-CM | POA: Diagnosis not present

## 2023-04-01 DIAGNOSIS — G8929 Other chronic pain: Secondary | ICD-10-CM | POA: Diagnosis not present

## 2023-04-01 DIAGNOSIS — M542 Cervicalgia: Secondary | ICD-10-CM | POA: Diagnosis not present

## 2023-04-01 DIAGNOSIS — M545 Low back pain, unspecified: Secondary | ICD-10-CM | POA: Diagnosis not present

## 2023-04-01 DIAGNOSIS — Z79891 Long term (current) use of opiate analgesic: Secondary | ICD-10-CM | POA: Diagnosis not present

## 2023-04-01 DIAGNOSIS — F1120 Opioid dependence, uncomplicated: Secondary | ICD-10-CM | POA: Diagnosis not present

## 2023-04-01 DIAGNOSIS — Z96641 Presence of right artificial hip joint: Secondary | ICD-10-CM | POA: Diagnosis not present

## 2023-04-01 DIAGNOSIS — M6281 Muscle weakness (generalized): Secondary | ICD-10-CM | POA: Diagnosis not present

## 2023-04-15 ENCOUNTER — Other Ambulatory Visit: Payer: 59

## 2023-04-15 DIAGNOSIS — M961 Postlaminectomy syndrome, not elsewhere classified: Secondary | ICD-10-CM | POA: Diagnosis not present

## 2023-04-15 DIAGNOSIS — G4709 Other insomnia: Secondary | ICD-10-CM | POA: Diagnosis not present

## 2023-04-15 DIAGNOSIS — Z79891 Long term (current) use of opiate analgesic: Secondary | ICD-10-CM | POA: Diagnosis not present

## 2023-04-15 DIAGNOSIS — M545 Low back pain, unspecified: Secondary | ICD-10-CM | POA: Diagnosis not present

## 2023-04-15 DIAGNOSIS — E119 Type 2 diabetes mellitus without complications: Secondary | ICD-10-CM | POA: Diagnosis not present

## 2023-04-15 DIAGNOSIS — Z720 Tobacco use: Secondary | ICD-10-CM | POA: Diagnosis not present

## 2023-04-15 DIAGNOSIS — G894 Chronic pain syndrome: Secondary | ICD-10-CM | POA: Diagnosis not present

## 2023-04-15 DIAGNOSIS — M1991 Primary osteoarthritis, unspecified site: Secondary | ICD-10-CM | POA: Diagnosis not present

## 2023-04-15 DIAGNOSIS — F1120 Opioid dependence, uncomplicated: Secondary | ICD-10-CM | POA: Diagnosis not present

## 2023-04-15 DIAGNOSIS — F3189 Other bipolar disorder: Secondary | ICD-10-CM | POA: Diagnosis not present

## 2023-04-15 DIAGNOSIS — M542 Cervicalgia: Secondary | ICD-10-CM | POA: Diagnosis not present

## 2023-04-15 DIAGNOSIS — Z716 Tobacco abuse counseling: Secondary | ICD-10-CM | POA: Diagnosis not present

## 2023-04-22 ENCOUNTER — Inpatient Hospital Stay
Admission: EM | Admit: 2023-04-22 | Discharge: 2023-04-25 | DRG: 872 | Disposition: A | Discharge status: Home / Self Care | Payer: 59 | Attending: Internal Medicine | Admitting: Internal Medicine

## 2023-04-22 ENCOUNTER — Other Ambulatory Visit: Payer: Self-pay

## 2023-04-22 ENCOUNTER — Emergency Department: Payer: 59

## 2023-04-22 DIAGNOSIS — R4182 Altered mental status, unspecified: Principal | ICD-10-CM

## 2023-04-22 DIAGNOSIS — E1169 Type 2 diabetes mellitus with other specified complication: Secondary | ICD-10-CM

## 2023-04-22 DIAGNOSIS — R519 Headache, unspecified: Secondary | ICD-10-CM | POA: Diagnosis not present

## 2023-04-22 DIAGNOSIS — Z8249 Family history of ischemic heart disease and other diseases of the circulatory system: Secondary | ICD-10-CM

## 2023-04-22 DIAGNOSIS — I10 Essential (primary) hypertension: Secondary | ICD-10-CM | POA: Diagnosis present

## 2023-04-22 DIAGNOSIS — A419 Sepsis, unspecified organism: Principal | ICD-10-CM | POA: Diagnosis present

## 2023-04-22 DIAGNOSIS — Z833 Family history of diabetes mellitus: Secondary | ICD-10-CM

## 2023-04-22 DIAGNOSIS — E669 Obesity, unspecified: Secondary | ICD-10-CM

## 2023-04-22 DIAGNOSIS — K219 Gastro-esophageal reflux disease without esophagitis: Secondary | ICD-10-CM | POA: Insufficient documentation

## 2023-04-22 DIAGNOSIS — Z91040 Latex allergy status: Secondary | ICD-10-CM

## 2023-04-22 DIAGNOSIS — R0902 Hypoxemia: Secondary | ICD-10-CM

## 2023-04-22 DIAGNOSIS — Z1152 Encounter for screening for COVID-19: Secondary | ICD-10-CM

## 2023-04-22 DIAGNOSIS — R509 Fever, unspecified: Secondary | ICD-10-CM | POA: Diagnosis not present

## 2023-04-22 DIAGNOSIS — F319 Bipolar disorder, unspecified: Secondary | ICD-10-CM | POA: Diagnosis present

## 2023-04-22 DIAGNOSIS — Z96643 Presence of artificial hip joint, bilateral: Secondary | ICD-10-CM | POA: Diagnosis present

## 2023-04-22 DIAGNOSIS — E119 Type 2 diabetes mellitus without complications: Secondary | ICD-10-CM | POA: Diagnosis present

## 2023-04-22 DIAGNOSIS — Z7984 Long term (current) use of oral hypoglycemic drugs: Secondary | ICD-10-CM

## 2023-04-22 DIAGNOSIS — Z888 Allergy status to other drugs, medicaments and biological substances status: Secondary | ICD-10-CM

## 2023-04-22 DIAGNOSIS — J189 Pneumonia, unspecified organism: Secondary | ICD-10-CM | POA: Diagnosis not present

## 2023-04-22 DIAGNOSIS — I672 Cerebral atherosclerosis: Secondary | ICD-10-CM | POA: Diagnosis not present

## 2023-04-22 DIAGNOSIS — Z9049 Acquired absence of other specified parts of digestive tract: Secondary | ICD-10-CM

## 2023-04-22 DIAGNOSIS — Z886 Allergy status to analgesic agent status: Secondary | ICD-10-CM

## 2023-04-22 DIAGNOSIS — B349 Viral infection, unspecified: Secondary | ICD-10-CM | POA: Diagnosis present

## 2023-04-22 DIAGNOSIS — M1990 Unspecified osteoarthritis, unspecified site: Secondary | ICD-10-CM | POA: Diagnosis present

## 2023-04-22 DIAGNOSIS — Z90710 Acquired absence of both cervix and uterus: Secondary | ICD-10-CM

## 2023-04-22 DIAGNOSIS — R001 Bradycardia, unspecified: Secondary | ICD-10-CM | POA: Diagnosis present

## 2023-04-22 DIAGNOSIS — Z96641 Presence of right artificial hip joint: Secondary | ICD-10-CM | POA: Diagnosis not present

## 2023-04-22 DIAGNOSIS — G9341 Metabolic encephalopathy: Secondary | ICD-10-CM

## 2023-04-22 DIAGNOSIS — Z7982 Long term (current) use of aspirin: Secondary | ICD-10-CM

## 2023-04-22 DIAGNOSIS — T8451XA Infection and inflammatory reaction due to internal right hip prosthesis, initial encounter: Secondary | ICD-10-CM

## 2023-04-22 DIAGNOSIS — R9431 Abnormal electrocardiogram [ECG] [EKG]: Secondary | ICD-10-CM | POA: Diagnosis not present

## 2023-04-22 DIAGNOSIS — F419 Anxiety disorder, unspecified: Secondary | ICD-10-CM | POA: Diagnosis present

## 2023-04-22 DIAGNOSIS — M542 Cervicalgia: Secondary | ICD-10-CM | POA: Diagnosis not present

## 2023-04-22 DIAGNOSIS — E039 Hypothyroidism, unspecified: Secondary | ICD-10-CM | POA: Diagnosis present

## 2023-04-22 DIAGNOSIS — F32A Depression, unspecified: Secondary | ICD-10-CM | POA: Insufficient documentation

## 2023-04-22 DIAGNOSIS — Z79899 Other long term (current) drug therapy: Secondary | ICD-10-CM

## 2023-04-22 DIAGNOSIS — Z882 Allergy status to sulfonamides status: Secondary | ICD-10-CM

## 2023-04-22 DIAGNOSIS — Z87891 Personal history of nicotine dependence: Secondary | ICD-10-CM

## 2023-04-22 DIAGNOSIS — Z79890 Hormone replacement therapy (postmenopausal): Secondary | ICD-10-CM

## 2023-04-22 DIAGNOSIS — F4310 Post-traumatic stress disorder, unspecified: Secondary | ICD-10-CM | POA: Diagnosis present

## 2023-04-22 LAB — RESP PANEL BY RT-PCR (RSV, FLU A&B, COVID)  RVPGX2
Influenza A by PCR: NEGATIVE
Influenza B by PCR: NEGATIVE
Resp Syncytial Virus by PCR: NEGATIVE
SARS Coronavirus 2 by RT PCR: NEGATIVE

## 2023-04-22 LAB — CBC WITH DIFFERENTIAL/PLATELET
Abs Immature Granulocytes: 0.03 10*3/uL (ref 0.00–0.07)
Basophils Absolute: 0.1 10*3/uL (ref 0.0–0.1)
Basophils Relative: 1 %
Eosinophils Absolute: 0.1 10*3/uL (ref 0.0–0.5)
Eosinophils Relative: 1 %
HCT: 38.3 % (ref 36.0–46.0)
Hemoglobin: 12.6 g/dL (ref 12.0–15.0)
Immature Granulocytes: 0 %
Lymphocytes Relative: 30 %
Lymphs Abs: 2.5 10*3/uL (ref 0.7–4.0)
MCH: 30.8 pg (ref 26.0–34.0)
MCHC: 32.9 g/dL (ref 30.0–36.0)
MCV: 93.6 fL (ref 80.0–100.0)
Monocytes Absolute: 0.5 10*3/uL (ref 0.1–1.0)
Monocytes Relative: 6 %
Neutro Abs: 5.3 10*3/uL (ref 1.7–7.7)
Neutrophils Relative %: 62 %
Platelets: 335 10*3/uL (ref 150–400)
RBC: 4.09 MIL/uL (ref 3.87–5.11)
RDW: 12.9 % (ref 11.5–15.5)
WBC: 8.4 10*3/uL (ref 4.0–10.5)
nRBC: 0 % (ref 0.0–0.2)

## 2023-04-22 LAB — LACTIC ACID, PLASMA: Lactic Acid, Venous: 1.4 mmol/L (ref 0.5–1.9)

## 2023-04-22 LAB — URINALYSIS, COMPLETE (UACMP) WITH MICROSCOPIC
Bacteria, UA: NONE SEEN
Bilirubin Urine: NEGATIVE
Glucose, UA: NEGATIVE mg/dL
Ketones, ur: NEGATIVE mg/dL
Leukocytes,Ua: NEGATIVE
Nitrite: NEGATIVE
Protein, ur: NEGATIVE mg/dL
Specific Gravity, Urine: 1.014 (ref 1.005–1.030)
pH: 7 (ref 5.0–8.0)

## 2023-04-22 LAB — BLOOD GAS, VENOUS
Acid-Base Excess: 1 mmol/L (ref 0.0–2.0)
Bicarbonate: 23.1 mmol/L (ref 20.0–28.0)
O2 Saturation: 85.8 %
Patient temperature: 37
pCO2, Ven: 29 mmHg — ABNORMAL LOW (ref 44–60)
pH, Ven: 7.51 — ABNORMAL HIGH (ref 7.25–7.43)
pO2, Ven: 50 mmHg — ABNORMAL HIGH (ref 32–45)

## 2023-04-22 LAB — COMPREHENSIVE METABOLIC PANEL
ALT: 17 U/L (ref 0–44)
AST: 22 U/L (ref 15–41)
Albumin: 4.7 g/dL (ref 3.5–5.0)
Alkaline Phosphatase: 74 U/L (ref 38–126)
Anion gap: 11 (ref 5–15)
BUN: 15 mg/dL (ref 6–20)
CO2: 18 mmol/L — ABNORMAL LOW (ref 22–32)
Calcium: 9 mg/dL (ref 8.9–10.3)
Chloride: 104 mmol/L (ref 98–111)
Creatinine, Ser: 1.11 mg/dL — ABNORMAL HIGH (ref 0.44–1.00)
GFR, Estimated: >60 mL/min (ref >60)
Glucose, Bld: 113 mg/dL — ABNORMAL HIGH (ref 70–99)
Potassium: 3.9 mmol/L (ref 3.5–5.1)
Sodium: 133 mmol/L — ABNORMAL LOW (ref 135–145)
Total Bilirubin: 0.5 mg/dL (ref 0.3–1.2)
Total Protein: 7.8 g/dL (ref 6.5–8.1)

## 2023-04-22 LAB — CBG MONITORING, ED: Glucose-Capillary: 117 mg/dL — ABNORMAL HIGH (ref 70–99)

## 2023-04-22 LAB — URINE DRUG SCREEN, QUALITATIVE (ARMC ONLY)
Amphetamines, Ur Screen: NOT DETECTED
Barbiturates, Ur Screen: NOT DETECTED
Benzodiazepine, Ur Scrn: NOT DETECTED
Cannabinoid 50 Ng, Ur ~~LOC~~: NOT DETECTED
Cocaine Metabolite,Ur ~~LOC~~: NOT DETECTED
MDMA (Ecstasy)Ur Screen: NOT DETECTED
Methadone Scn, Ur: NOT DETECTED
Opiate, Ur Screen: POSITIVE — AB
Phencyclidine (PCP) Ur S: NOT DETECTED
Tricyclic, Ur Screen: POSITIVE — AB

## 2023-04-22 LAB — APTT: aPTT: 29 seconds (ref 24–36)

## 2023-04-22 LAB — PROTIME-INR
INR: 0.9 (ref 0.8–1.2)
Prothrombin Time: 12.4 seconds (ref 11.4–15.2)

## 2023-04-22 LAB — AMMONIA: Ammonia: 21 umol/L (ref 9–35)

## 2023-04-22 MED ORDER — ACETAMINOPHEN 325 MG PO TABS
650.0000 mg | ORAL_TABLET | Freq: Once | ORAL | Status: AC
  Administered 2023-04-22: 650 mg via ORAL
  Filled 2023-04-22: qty 2

## 2023-04-22 MED ORDER — SODIUM CHLORIDE 0.9 % IV BOLUS (SEPSIS)
1000.0000 mL | Freq: Once | INTRAVENOUS | Status: AC
  Administered 2023-04-22: 1000 mL via INTRAVENOUS

## 2023-04-22 MED ORDER — DIPHENHYDRAMINE HCL 50 MG/ML IJ SOLN
INTRAMUSCULAR | Status: AC
  Filled 2023-04-22: qty 1

## 2023-04-22 MED ORDER — METRONIDAZOLE 500 MG/100ML IV SOLN
500.0000 mg | Freq: Once | INTRAVENOUS | Status: AC
  Administered 2023-04-23: 500 mg via INTRAVENOUS
  Filled 2023-04-22: qty 100

## 2023-04-22 MED ORDER — DIPHENHYDRAMINE HCL 50 MG/ML IJ SOLN
50.0000 mg | Freq: Once | INTRAMUSCULAR | Status: AC
  Administered 2023-04-22: 50 mg via INTRAVENOUS

## 2023-04-22 MED ORDER — ONDANSETRON HCL 4 MG/2ML IJ SOLN
4.0000 mg | Freq: Once | INTRAMUSCULAR | Status: AC
  Administered 2023-04-22: 4 mg via INTRAVENOUS
  Filled 2023-04-22: qty 2

## 2023-04-22 MED ORDER — SODIUM CHLORIDE 0.9 % IV BOLUS (SEPSIS)
250.0000 mL | Freq: Once | INTRAVENOUS | Status: AC
  Administered 2023-04-22: 250 mL via INTRAVENOUS

## 2023-04-22 MED ORDER — VANCOMYCIN HCL IN DEXTROSE 1-5 GM/200ML-% IV SOLN: 1000.0000 mg | Freq: Once | INTRAVENOUS | Status: DC

## 2023-04-22 MED ORDER — SODIUM CHLORIDE 0.9 % IV SOLN
2.0000 g | Freq: Once | INTRAVENOUS | Status: AC
  Administered 2023-04-22: 2 g via INTRAVENOUS
  Filled 2023-04-22: qty 12.5

## 2023-04-22 MED ORDER — VANCOMYCIN HCL 1500 MG/300ML IV SOLN
1500.0000 mg | Freq: Once | INTRAVENOUS | Status: AC
  Administered 2023-04-23: 1500 mg via INTRAVENOUS
  Filled 2023-04-22: qty 300

## 2023-04-22 MED ORDER — MORPHINE SULFATE (PF) 4 MG/ML IV SOLN
4.0000 mg | Freq: Once | INTRAVENOUS | Status: AC
  Administered 2023-04-22: 4 mg via INTRAVENOUS
  Filled 2023-04-22: qty 1

## 2023-04-22 MED ORDER — LACTATED RINGERS IV SOLN: INTRAVENOUS | Status: AC

## 2023-04-22 NOTE — ED Notes (Signed)
Pt reports improvement in itching and burning to L arm. Rash noted to be improving as well. Pt also reports that tightness and itching in throat has resolved. Pt remains on full cardiac monitor.

## 2023-04-22 NOTE — ED Provider Notes (Signed)
[facility] Provider Note   Event Date/Time   First MD Initiated Contact with Patient 04/22/23 2227     (approximate) History  Headache, Fever, and Altered Mental Status  HPI Kathleen Carr is a 52 y.o. female with past medical history of 1 bipolar disorder, type 2 diabetes, hypothyroidism, and left hip arthroplasty 1 month prior to arrival who presents complaining of severe headache, neck ache, back pain, body aches, and cramps.  Patient is moaning in triage as well as on exam and not answering questions appropriately.  Patient states "I feel glass, I am burning". ROS: Unable to assess   Physical Exam  Triage Vital Signs: ED Triage Vitals  Enc Vitals Group     BP 04/22/23 2211 (!) 141/87     Pulse Rate 04/22/23 2211 (!) 101     Resp 04/22/23 2211 20     Temp 04/22/23 2211 (!) 100.9 F (38.3 C)     Temp Source 04/22/23 2211 Oral     SpO2 04/22/23 2211 94 %     Weight 04/22/23 2220 160 lb (72.6 kg)     Height 04/22/23 2220 5\' 4"  (1.626 m)     Head Circumference --      Peak Flow --      Pain Score 04/22/23 2219 8     Pain Loc --      Pain Edu? --      Excl. in GC? --    Most recent vital signs: Vitals:   04/22/23 2211  BP: (!) 141/87  Pulse: (!) 101  Resp: 20  Temp: (!) 100.9 F (38.3 C)  SpO2: 94%   General: Awake, uncooperative CV:  Good peripheral perfusion.  Resp:  Normal effort.  Abd:  No distention.  Other:  Middle-aged overweight Caucasian female laying in bed responding to internal stimuli and in acute distress secondary to pain.  There is apparent urticaria to the left upper extremity ED Results / Procedures / Treatments  Labs (all labs ordered are listed, but only abnormal results are displayed) Labs Reviewed  CBG MONITORING, ED - Abnormal; Notable for the following components:      Result Value   Glucose-Capillary 117 (*)    All other components within normal limits  RESP PANEL BY RT-PCR (RSV, FLU A&B, COVID)  [id number]  CULTURE,  BLOOD (ROUTINE X 2)  CULTURE, BLOOD (ROUTINE X 2)  LACTIC ACID, PLASMA  LACTIC ACID, PLASMA  COMPREHENSIVE METABOLIC PANEL  CBC WITH DIFFERENTIAL/PLATELET  PROTIME-INR  APTT  URINALYSIS, COMPLETE (UACMP) WITH MICROSCOPIC  AMMONIA  BLOOD GAS, VENOUS  URINE DRUG SCREEN, QUALITATIVE ([facility] ONLY)  TSH  T4, FREE   EKG ED ECG REPORT I, Merwyn Katos, the attending physician, personally viewed and interpreted this ECG. Date: 04/22/2023 EKG Time: 2233 Rate: 88 Rhythm: normal sinus rhythm QRS Axis: normal Intervals: normal ST/T Wave abnormalities: normal Narrative Interpretation: no evidence of acute ischemia RADIOLOGY ED MD interpretation: Pending -Agree with radiology assessment Official radiology report(s): No results found. PROCEDURES: Critical Care performed: No Procedures MEDICATIONS ORDERED IN ED: Medications  lactated ringers infusion (has no administration in time range)  sodium chloride 0.9 % bolus 1,000 mL (1,000 mLs Intravenous New Bag/Given 04/22/23 2244)    And  sodium chloride 0.9 % bolus 1,000 mL (1,000 mLs Intravenous New Bag/Given 04/22/23 2243)    And  sodium chloride 0.9 % bolus 250 mL (has no administration in time range)  ceFEPIme (MAXIPIME) 2 g in sodium chloride 0.9 %  100 mL IVPB (2 g Intravenous New Bag/Given 04/22/23 2244)  metroNIDAZOLE (FLAGYL) IVPB 500 mg (has no administration in time range)  vancomycin (VANCOREADY) IVPB 1500 mg/300 mL (has no administration in time range)  diphenhydrAMINE (BENADRYL) 50 MG/ML injection (has no administration in time range)  acetaminophen (TYLENOL) tablet 650 mg (650 mg Oral Given 04/22/23 2256)  morphine (PF) 4 MG/ML injection 4 mg (4 mg Intravenous Given 04/22/23 2244)  ondansetron (ZOFRAN) injection 4 mg (4 mg Intravenous Given 04/22/23 2244)  diphenhydrAMINE (BENADRYL) injection 50 mg (50 mg Intravenous Given 04/22/23 2254)   IMPRESSION / MDM / ASSESSMENT AND PLAN / ED COURSE  I reviewed the triage vital signs and  the nursing notes.                             The patient is on the cardiac monitor to evaluate for evidence of arrhythmia and/or significant heart rate changes. Patient's presentation is most consistent with acute presentation with potential threat to life or bodily function. Patient presents for altered mental status of unknown origin  Will obtain medical workup and discuss with social work to try to obtain collateral information.  Given History, Physical, and Workup there is no overt concern for a dangerous emergent cause such as, but not limited to, CNS infection, severe Toxidrome, severe metabolic derangement, or stroke. Benadryl given IV for possible medication allergy Disposition: Care of this patient will be signed out to the oncoming physician at the end of my shift.  All pertinent patient information conveyed and all questions answered.  All further care and disposition decisions will be made by the oncoming physician.   FINAL CLINICAL IMPRESSION(S) / ED DIAGNOSES   Final diagnoses:  Altered mental status, unspecified altered mental status type   Rx / DC Orders   ED Discharge Orders     None      Note:  This document was prepared using Dragon voice recognition software and may include unintentional dictation errors.   Merwyn Katos, MD 04/22/23 2300

## 2023-04-22 NOTE — Progress Notes (Signed)
CODE SEPSIS - PHARMACY COMMUNICATION  **Broad Spectrum Antibiotics should be administered within 1 hour of Sepsis diagnosis**  Time Code Sepsis Called/Page Received: 2236  Antibiotics Ordered: Cefepime, Vancomycin, Flagyl  Time of 1st antibiotic administration: 2244  Otelia Sergeant, PharmD, [facility] 04/22/2023 10:35 PM

## 2023-04-22 NOTE — Progress Notes (Signed)
Elink monitoring for the code sepsis protocol.  

## 2023-04-22 NOTE — Progress Notes (Signed)
PHARMACY -  BRIEF ANTIBIOTIC NOTE   Pharmacy has received consult(s) for Vancomycin from an ED provider.  The patient's profile has been reviewed for ht/wt/allergies/indication/available labs.    One time order(s) placed for Vancomycin 1500 mg per pt wt: 72.6 kg.  Further antibiotics/pharmacy consults should be ordered by admitting physician if indicated.                       Thank you, Otelia Sergeant, PharmD, [facility] 04/22/2023 10:34 PM

## 2023-04-22 NOTE — ED Notes (Signed)
Patient transported to CT 

## 2023-04-22 NOTE — ED Notes (Signed)
Pt c/o itching to throat and L arm. Pt just received NS, Zofran, and morphine in IV in L arm. Upon inspection, rash noted to L arm. MD notified and is ordering IV benadryl. Pt is maintaining airway and secretions. Spo2 WNL.

## 2023-04-22 NOTE — ED Notes (Signed)
Patient placed on 2L  due to Spo2 78% on RA. Spo2 now 96%.

## 2023-04-22 NOTE — ED Triage Notes (Addendum)
Pt in via POV, spouse reports severe headache w/ neck pain that started yesterday, then developing body aches and cramps today.  Patient moaning in triage, does not answer questions appropriately; states, "I feel like glass, I'm not ready to go yet."  Spouse states, "She has gone septic before."  Patient roomed at this time.

## 2023-04-23 ENCOUNTER — Ambulatory Visit: Payer: 59 | Admitting: Student in an Organized Health Care Education/Training Program

## 2023-04-23 ENCOUNTER — Emergency Department: Payer: 59

## 2023-04-23 DIAGNOSIS — G9341 Metabolic encephalopathy: Secondary | ICD-10-CM

## 2023-04-23 DIAGNOSIS — E039 Hypothyroidism, unspecified: Secondary | ICD-10-CM | POA: Diagnosis not present

## 2023-04-23 DIAGNOSIS — A419 Sepsis, unspecified organism: Secondary | ICD-10-CM | POA: Diagnosis not present

## 2023-04-23 DIAGNOSIS — R0902 Hypoxemia: Secondary | ICD-10-CM

## 2023-04-23 DIAGNOSIS — I10 Essential (primary) hypertension: Secondary | ICD-10-CM | POA: Diagnosis not present

## 2023-04-23 DIAGNOSIS — Z833 Family history of diabetes mellitus: Secondary | ICD-10-CM | POA: Diagnosis not present

## 2023-04-23 DIAGNOSIS — Z888 Allergy status to other drugs, medicaments and biological substances status: Secondary | ICD-10-CM | POA: Diagnosis not present

## 2023-04-23 DIAGNOSIS — Z1152 Encounter for screening for COVID-19: Secondary | ICD-10-CM | POA: Diagnosis not present

## 2023-04-23 DIAGNOSIS — F4310 Post-traumatic stress disorder, unspecified: Secondary | ICD-10-CM | POA: Diagnosis not present

## 2023-04-23 DIAGNOSIS — Z8249 Family history of ischemic heart disease and other diseases of the circulatory system: Secondary | ICD-10-CM | POA: Diagnosis not present

## 2023-04-23 DIAGNOSIS — Z886 Allergy status to analgesic agent status: Secondary | ICD-10-CM | POA: Diagnosis not present

## 2023-04-23 DIAGNOSIS — E669 Obesity, unspecified: Secondary | ICD-10-CM

## 2023-04-23 DIAGNOSIS — Z7984 Long term (current) use of oral hypoglycemic drugs: Secondary | ICD-10-CM | POA: Diagnosis not present

## 2023-04-23 DIAGNOSIS — M1990 Unspecified osteoarthritis, unspecified site: Secondary | ICD-10-CM | POA: Diagnosis not present

## 2023-04-23 DIAGNOSIS — K219 Gastro-esophageal reflux disease without esophagitis: Secondary | ICD-10-CM | POA: Diagnosis not present

## 2023-04-23 DIAGNOSIS — F419 Anxiety disorder, unspecified: Secondary | ICD-10-CM | POA: Diagnosis not present

## 2023-04-23 DIAGNOSIS — Z79890 Hormone replacement therapy (postmenopausal): Secondary | ICD-10-CM | POA: Diagnosis not present

## 2023-04-23 DIAGNOSIS — E1169 Type 2 diabetes mellitus with other specified complication: Secondary | ICD-10-CM

## 2023-04-23 DIAGNOSIS — Z9049 Acquired absence of other specified parts of digestive tract: Secondary | ICD-10-CM | POA: Diagnosis not present

## 2023-04-23 DIAGNOSIS — F32A Depression, unspecified: Secondary | ICD-10-CM | POA: Insufficient documentation

## 2023-04-23 DIAGNOSIS — Z96641 Presence of right artificial hip joint: Secondary | ICD-10-CM | POA: Diagnosis not present

## 2023-04-23 DIAGNOSIS — E119 Type 2 diabetes mellitus without complications: Secondary | ICD-10-CM

## 2023-04-23 DIAGNOSIS — Z882 Allergy status to sulfonamides status: Secondary | ICD-10-CM | POA: Diagnosis not present

## 2023-04-23 DIAGNOSIS — B349 Viral infection, unspecified: Secondary | ICD-10-CM | POA: Diagnosis not present

## 2023-04-23 DIAGNOSIS — R590 Localized enlarged lymph nodes: Secondary | ICD-10-CM | POA: Diagnosis not present

## 2023-04-23 DIAGNOSIS — Z96643 Presence of artificial hip joint, bilateral: Secondary | ICD-10-CM | POA: Diagnosis not present

## 2023-04-23 DIAGNOSIS — Z90710 Acquired absence of both cervix and uterus: Secondary | ICD-10-CM | POA: Diagnosis not present

## 2023-04-23 DIAGNOSIS — Z87891 Personal history of nicotine dependence: Secondary | ICD-10-CM | POA: Diagnosis not present

## 2023-04-23 DIAGNOSIS — F319 Bipolar disorder, unspecified: Secondary | ICD-10-CM | POA: Diagnosis not present

## 2023-04-23 DIAGNOSIS — J189 Pneumonia, unspecified organism: Secondary | ICD-10-CM | POA: Diagnosis not present

## 2023-04-23 DIAGNOSIS — R918 Other nonspecific abnormal finding of lung field: Secondary | ICD-10-CM | POA: Diagnosis not present

## 2023-04-23 DIAGNOSIS — R001 Bradycardia, unspecified: Secondary | ICD-10-CM | POA: Diagnosis not present

## 2023-04-23 DIAGNOSIS — R509 Fever, unspecified: Secondary | ICD-10-CM | POA: Diagnosis not present

## 2023-04-23 LAB — COMPREHENSIVE METABOLIC PANEL
ALT: 15 U/L (ref 0–44)
AST: 17 U/L (ref 15–41)
Albumin: 3.3 g/dL — ABNORMAL LOW (ref 3.5–5.0)
Alkaline Phosphatase: 71 U/L (ref 38–126)
Anion gap: 5 (ref 5–15)
BUN: 13 mg/dL (ref 6–20)
CO2: 21 mmol/L — ABNORMAL LOW (ref 22–32)
Calcium: 8 mg/dL — ABNORMAL LOW (ref 8.9–10.3)
Chloride: 107 mmol/L (ref 98–111)
Creatinine, Ser: 1.07 mg/dL — ABNORMAL HIGH (ref 0.44–1.00)
GFR, Estimated: >60 mL/min (ref >60)
Glucose, Bld: 99 mg/dL (ref 70–99)
Potassium: 3.5 mmol/L (ref 3.5–5.1)
Sodium: 133 mmol/L — ABNORMAL LOW (ref 135–145)
Total Bilirubin: 0.6 mg/dL (ref 0.3–1.2)
Total Protein: 5.9 g/dL — ABNORMAL LOW (ref 6.5–8.1)

## 2023-04-23 LAB — CBC
HCT: 33.1 % — ABNORMAL LOW (ref 36.0–46.0)
Hemoglobin: 10.7 g/dL — ABNORMAL LOW (ref 12.0–15.0)
MCH: 31.3 pg (ref 26.0–34.0)
MCHC: 32.3 g/dL (ref 30.0–36.0)
MCV: 96.8 fL (ref 80.0–100.0)
Platelets: 277 10*3/uL (ref 150–400)
RBC: 3.42 MIL/uL — ABNORMAL LOW (ref 3.87–5.11)
RDW: 13.1 % (ref 11.5–15.5)
WBC: 7.7 10*3/uL (ref 4.0–10.5)
nRBC: 0 % (ref 0.0–0.2)

## 2023-04-23 LAB — CULTURE, BLOOD (ROUTINE X 2)
Culture: NO GROWTH
Special Requests: ADEQUATE

## 2023-04-23 LAB — HIV ANTIBODY (ROUTINE TESTING W REFLEX): HIV Screen 4th Generation wRfx: NONREACTIVE

## 2023-04-23 LAB — CORTISOL-AM, BLOOD: Cortisol - AM: 2.7 ug/dL — ABNORMAL LOW (ref 6.7–22.6)

## 2023-04-23 LAB — LACTIC ACID, PLASMA: Lactic Acid, Venous: 1.1 mmol/L (ref 0.5–1.9)

## 2023-04-23 LAB — TSH: TSH: 0.6 u[IU]/mL (ref 0.350–4.500)

## 2023-04-23 LAB — T4, FREE: Free T4: 0.84 ng/dL (ref 0.61–1.12)

## 2023-04-23 LAB — PROTIME-INR
INR: 0.9 (ref 0.8–1.2)
Prothrombin Time: 12.5 seconds (ref 11.4–15.2)

## 2023-04-23 LAB — CREATININE, SERUM
Creatinine, Ser: 1.08 mg/dL — ABNORMAL HIGH (ref 0.44–1.00)
GFR, Estimated: >60 mL/min (ref >60)

## 2023-04-23 LAB — PROCALCITONIN: Procalcitonin: 0.22 ng/mL

## 2023-04-23 MED ORDER — METHOCARBAMOL 500 MG PO TABS
500.0000 mg | ORAL_TABLET | Freq: Three times a day (TID) | ORAL | Status: DC
  Administered 2023-04-23 – 2023-04-25 (×7): 500 mg via ORAL
  Filled 2023-04-23 (×7): qty 1

## 2023-04-23 MED ORDER — ACETAMINOPHEN 650 MG RE SUPP: 650.0000 mg | Freq: Four times a day (QID) | RECTAL | Status: DC | PRN

## 2023-04-23 MED ORDER — ACETAMINOPHEN 325 MG PO TABS
650.0000 mg | ORAL_TABLET | Freq: Four times a day (QID) | ORAL | Status: DC | PRN
  Administered 2023-04-23 (×2): 650 mg via ORAL
  Filled 2023-04-23 (×2): qty 2

## 2023-04-23 MED ORDER — SODIUM CHLORIDE 0.9 % IV SOLN
500.0000 mg | INTRAVENOUS | Status: DC
  Administered 2023-04-23 – 2023-04-25 (×3): 500 mg via INTRAVENOUS
  Filled 2023-04-23 (×3): qty 5

## 2023-04-23 MED ORDER — TRAZODONE HCL 50 MG PO TABS: 25.0000 mg | ORAL_TABLET | Freq: Every evening | ORAL | Status: DC | PRN

## 2023-04-23 MED ORDER — LEVOTHYROXINE SODIUM 50 MCG PO TABS
50.0000 ug | ORAL_TABLET | Freq: Every day | ORAL | Status: DC
  Administered 2023-04-24 – 2023-04-25 (×2): 50 ug via ORAL
  Filled 2023-04-23 (×2): qty 1

## 2023-04-23 MED ORDER — OXYBUTYNIN CHLORIDE ER 5 MG PO TB24
15.0000 mg | ORAL_TABLET | Freq: Every day | ORAL | Status: DC
  Administered 2023-04-23 – 2023-04-24 (×2): 15 mg via ORAL
  Filled 2023-04-23 (×2): qty 1

## 2023-04-23 MED ORDER — ALBUTEROL SULFATE (2.5 MG/3ML) 0.083% IN NEBU: 2.5000 mg | INHALATION_SOLUTION | Freq: Four times a day (QID) | RESPIRATORY_TRACT | Status: DC | PRN

## 2023-04-23 MED ORDER — DOCUSATE SODIUM 100 MG PO CAPS
100.0000 mg | ORAL_CAPSULE | Freq: Every day | ORAL | Status: DC
  Administered 2023-04-23 – 2023-04-24 (×2): 100 mg via ORAL
  Filled 2023-04-23 (×3): qty 1

## 2023-04-23 MED ORDER — FENTANYL CITRATE PF 50 MCG/ML IJ SOSY
50.0000 ug | PREFILLED_SYRINGE | Freq: Once | INTRAMUSCULAR | Status: AC
  Administered 2023-04-23: 50 ug via INTRAVENOUS
  Filled 2023-04-23: qty 1

## 2023-04-23 MED ORDER — SERTRALINE HCL 50 MG PO TABS
100.0000 mg | ORAL_TABLET | Freq: Every day | ORAL | Status: DC
  Administered 2023-04-23 – 2023-04-24 (×3): 100 mg via ORAL
  Filled 2023-04-23 (×3): qty 2

## 2023-04-23 MED ORDER — HYDROCOD POLI-CHLORPHE POLI ER 10-8 MG/5ML PO SUER: 5.0000 mL | Freq: Two times a day (BID) | ORAL | Status: DC | PRN

## 2023-04-23 MED ORDER — GUAIFENESIN ER 600 MG PO TB12
600.0000 mg | ORAL_TABLET | Freq: Two times a day (BID) | ORAL | Status: DC
  Administered 2023-04-23 – 2023-04-25 (×5): 600 mg via ORAL
  Filled 2023-04-23 (×5): qty 1

## 2023-04-23 MED ORDER — METFORMIN HCL 500 MG PO TABS
1000.0000 mg | ORAL_TABLET | Freq: Two times a day (BID) | ORAL | Status: DC
  Administered 2023-04-25: 1000 mg via ORAL
  Filled 2023-04-23: qty 2

## 2023-04-23 MED ORDER — LACTATED RINGERS IV SOLN
150.0000 mL/h | INTRAVENOUS | Status: DC
  Administered 2023-04-23 (×2): 150 mL/h via INTRAVENOUS

## 2023-04-23 MED ORDER — ONDANSETRON HCL 4 MG PO TABS: 4.0000 mg | ORAL_TABLET | Freq: Four times a day (QID) | ORAL | Status: DC | PRN

## 2023-04-23 MED ORDER — ZOLPIDEM TARTRATE 5 MG PO TABS
5.0000 mg | ORAL_TABLET | Freq: Every evening | ORAL | Status: DC | PRN
  Administered 2023-04-24: 5 mg via ORAL
  Filled 2023-04-23: qty 1

## 2023-04-23 MED ORDER — ONDANSETRON HCL 4 MG/2ML IJ SOLN: 4.0000 mg | Freq: Four times a day (QID) | INTRAMUSCULAR | Status: DC | PRN

## 2023-04-23 MED ORDER — IOHEXOL 350 MG/ML SOLN
75.0000 mL | Freq: Once | INTRAVENOUS | Status: AC | PRN
  Administered 2023-04-23: 75 mL via INTRAVENOUS

## 2023-04-23 MED ORDER — QUETIAPINE FUMARATE 300 MG PO TABS
300.0000 mg | ORAL_TABLET | Freq: Every day | ORAL | Status: DC
  Administered 2023-04-23 – 2023-04-24 (×2): 300 mg via ORAL
  Filled 2023-04-23 (×2): qty 1

## 2023-04-23 MED ORDER — ACETAMINOPHEN 500 MG PO TABS
1000.0000 mg | ORAL_TABLET | Freq: Once | ORAL | Status: AC
  Administered 2023-04-23: 1000 mg via ORAL
  Filled 2023-04-23: qty 2

## 2023-04-23 MED ORDER — IPRATROPIUM-ALBUTEROL 0.5-2.5 (3) MG/3ML IN SOLN: 3.0000 mL | RESPIRATORY_TRACT | Status: DC | PRN

## 2023-04-23 MED ORDER — ENOXAPARIN SODIUM 40 MG/0.4ML IJ SOSY
40.0000 mg | PREFILLED_SYRINGE | INTRAMUSCULAR | Status: DC
  Administered 2023-04-23 – 2023-04-25 (×3): 40 mg via SUBCUTANEOUS
  Filled 2023-04-23 (×3): qty 0.4

## 2023-04-23 MED ORDER — ASPIRIN 81 MG PO CHEW
81.0000 mg | CHEWABLE_TABLET | Freq: Every day | ORAL | Status: DC
  Administered 2023-04-23 – 2023-04-25 (×3): 81 mg via ORAL
  Filled 2023-04-23 (×3): qty 1

## 2023-04-23 MED ORDER — OXYCODONE HCL 5 MG PO TABS
2.5000 mg | ORAL_TABLET | Freq: Four times a day (QID) | ORAL | Status: DC | PRN
  Administered 2023-04-23 – 2023-04-25 (×2): 5 mg via ORAL
  Filled 2023-04-23 (×2): qty 1

## 2023-04-23 MED ORDER — MAGNESIUM HYDROXIDE 400 MG/5ML PO SUSP: 30.0000 mL | Freq: Every day | ORAL | Status: DC | PRN

## 2023-04-23 MED ORDER — PANTOPRAZOLE SODIUM 40 MG PO TBEC
40.0000 mg | DELAYED_RELEASE_TABLET | Freq: Every day | ORAL | Status: DC
  Administered 2023-04-23 – 2023-04-25 (×3): 40 mg via ORAL
  Filled 2023-04-23 (×3): qty 1

## 2023-04-23 MED ORDER — SODIUM CHLORIDE 0.9 % IV SOLN
2.0000 g | INTRAVENOUS | Status: DC
  Administered 2023-04-23 – 2023-04-25 (×3): 2 g via INTRAVENOUS
  Filled 2023-04-23 (×3): qty 20

## 2023-04-23 MED ORDER — ALPRAZOLAM 0.5 MG PO TABS: 0.5000 mg | ORAL_TABLET | Freq: Two times a day (BID) | ORAL | Status: DC | PRN

## 2023-04-23 MED ORDER — IPRATROPIUM-ALBUTEROL 0.5-2.5 (3) MG/3ML IN SOLN
3.0000 mL | Freq: Four times a day (QID) | RESPIRATORY_TRACT | Status: DC
  Administered 2023-04-23 (×3): 3 mL via RESPIRATORY_TRACT
  Filled 2023-04-23 (×3): qty 3

## 2023-04-23 NOTE — ED Notes (Signed)
MD notified pt remains febrile.

## 2023-04-23 NOTE — ED Notes (Addendum)
Pt c/o burning and warmth to R hip incision from prior hip replacement. Incision  notably warm to palpation but is closed and free from redness or drainage. Reports the burning and warm sensation are new onset today. MD notified.

## 2023-04-23 NOTE — Progress Notes (Signed)
Brief hospitalist update note.  This is a nonbillable note.  Please see same-day H&P for full billable details.  Briefly, 52 year old female with PMH of asthma, bipolar, type 2 diabetes mellitus, hypertension, hypothyroid who presents to the ED with acute onset fever Tmax 103 with associated dyspnea and dry cough.  Pleuritic chest pain with deep breathing.  Patient had a red chili recently performed hip replacement.  She was seen in orthopedic office on the day prior to presentation to the ER.  Wound look good.  CT of hip performed on arrival to the ED demonstrated a intramuscular fluid collection.  Discussed case with orthopedics Dr. Audelia Acton.  Evaluated patient at bedside.  Surgical site looks clean.  Suspected that fluid collection is a seroma.  Per orthopedics unlikely to be source of infection.  Patient is on broad-spectrum antibiotics which I will continue for today.  Reevaluate tomorrow.  Monitor vitals and fever curve.  Lolita Patella MD

## 2023-04-23 NOTE — Assessment & Plan Note (Signed)
-   We will continue Synthroid. 

## 2023-04-23 NOTE — Consult Note (Signed)
ORTHOPAEDIC CONSULTATION  REQUESTING PHYSICIAN: Tresa Moore, MD  Chief Complaint:   Pneumonia status post right anterior total hip replacement  History of Present Illness: Kathleen Carr is a 52 y.o. female with medical history significant for asthma, bipolar disorder, type 2 diabetes mellitus, hypertension, hypothyroidism and GERD who presented to the emergency room yesterday with acute onset fever and confusion.  Patient had associated shortness of breath and a dry cough without wheezing and was worked up for sepsis pneumonia.  She is 6-week status post right anterior total hip replacement and I was consulted due to a small subcutaneous fluid collection noted on CT scan.  Of note I had examined this patient yesterday in the clinic and she was doing very well with her hip and her incision looks good at that time prior to this event.  The time of my evaluation at bedside she reports unchanged symptoms or pain related to her hip which is what it was at her follow-up visit and normal for postoperative course 6 weeks after anterior replacement.  She denies any wound drainage or changes in the incision since the time I seen her.  She sustained no trauma to her hip or leg in the interval time.  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   Pneumonia    PTSD (post-traumatic stress disorder)    Smoker    Thyroid disease    Past Surgical History:  Procedure Laterality Date   ABDOMINAL HYSTERECTOMY  12/03/2011   complete   ADENOIDECTOMY, TONSILLECTOMY AND MYRINGOTOMY WITH TUBE PLACEMENT  1977   CARPAL TUNNEL RELEASE Right 03/12/2022    Procedure: RIGHT CARPAL TUNNEL RELEASE;  Surgeon: Marlyne Beards, MD;  Location: [facility];  Service: Orthopedics;  Laterality: Right;   CARPAL TUNNEL RELEASE Left 04/16/2022   Procedure: LEFT CARPAL TUNNEL RELEASE;  Surgeon: Marlyne Beards, MD;  Location: [facility];  Service: Orthopedics;  Laterality: Left;   CHOLECYSTECTOMY  06/08/2011   COLONOSCOPY WITH PROPOFOL N/A 02/05/2022   Procedure: COLONOSCOPY WITH PROPOFOL;  Surgeon: Wyline Mood, MD;  Location: [facility] ENDOSCOPY;  Service: Gastroenterology;  Laterality: N/A;   ESOPHAGOGASTRODUODENOSCOPY N/A 02/05/2022   Procedure: ESOPHAGOGASTRODUODENOSCOPY (EGD);  Surgeon: Wyline Mood, MD;  Location: [facility] ENDOSCOPY;  Service: Gastroenterology;  Laterality: N/A;   HIP ARTHROSCOPY Right 08/19/2022   Procedure: Right hip arthroscopy, acetabuloplasty, labral repair, femoral osteochondroplasty, capsular closure;  Surgeon: Signa Kell, MD;  Location: [facility]S;  Service: Orthopedics;  Laterality: Right;   KNEE ARTHROSCOPY Right 1993   KNEE ARTHROSCOPY Left 1998   LUMBAR LAMINECTOMY Left 2021   TOTAL HIP ARTHROPLASTY Right 03/09/2023   Procedure: TOTAL HIP ARTHROPLASTY ANTERIOR APPROACH;  Surgeon: Reinaldo Berber, MD;  Location: [facility] ORS;  Service: Orthopedics;  Laterality: Right;   Social History   Socioeconomic History   Marital status: Married    Spouse name: Misty Stanley   Number of children: 0   Years of education: Not on file   Highest education level: Associate degree: occupational, Scientist, product/process development, or vocational program  Occupational History   Not on file  Tobacco Use   Smoking status: Former    Packs/day:  0.50    Years: 42.00    Additional pack years: 0.00    Total pack years: 21.00    Types: Cigarettes    Quit date: 01/26/2023    Years since quitting: 0.2   Smokeless tobacco: Never  Vaping Use   Vaping Use: Former   Substances: Nicotine  Substance and Sexual Activity   Alcohol use: Not Currently   Drug use:  Not Currently   Sexual activity: Yes    Partners: Female    Comment: married to wife  Other Topics Concern   Not on file  Social History Narrative   Not on file   Social Determinants of Health   Financial Resource Strain: Low Risk  (02/09/2023)   Overall Financial Resource Strain (CARDIA)    Difficulty of Paying Living Expenses: Not hard at all  Food Insecurity: No Food Insecurity (03/09/2023)   Hunger Vital Sign    Worried About Running Out of Food in the Last Year: Never true    Ran Out of Food in the Last Year: Never true  Transportation Needs: No Transportation Needs (03/09/2023)   PRAPARE - Administrator, Civil Service (Medical): No    Lack of Transportation (Non-Medical): No  Physical Activity: Unknown (02/09/2023)   Exercise Vital Sign    Days of Exercise per Week: 0 days    Minutes of Exercise per Session: Not on file  Stress: Patient Declined (02/09/2023)   [employer] of Occupational Health - Occupational Stress Questionnaire    Feeling of Stress : Patient declined  Social Connections: Moderately Isolated (02/09/2023)   Social Connection and Isolation Panel [NHANES]    Frequency of Communication with Friends and Family: More than three times a week    Frequency of Social Gatherings with Friends and Family: Patient declined    Attends Religious Services: Never    Database administrator or Organizations: No    Attends Engineer, structural: Not on file    Marital Status: Married   Family History  Problem Relation Age of Onset   Hypertension Mother    Thyroid disease Mother    Lung cancer Father 50       carcinoid, right lung   Ulcerative colitis Father    Renal Disease Maternal Grandfather    Diabetes Maternal Grandfather        Type2   Glaucoma Maternal Grandmother    Stroke Paternal Grandfather    Hypertension Paternal Grandfather    Allergies  Allergen Reactions   Dust Mite Mixed Allergen Ext [Mite (D. Farinae)]     Respiratory  distresss   Nsaids     Acute renal failure   Other Hives    Allergy to Hickory, walnut and Birch trees and all grasses and allergic to Rabbits- patient reports anaphylactic    Sulfa Antibiotics Hives   Latex Itching   Misc. Sulfonamide Containing Compounds Hives   Prior to Admission medications   Medication Sig Start Date End Date Taking? Authorizing Provider  acetaminophen (TYLENOL) 500 MG tablet Take 2 tablets (1,000 mg total) by mouth every 8 (eight) hours. 03/10/23  Yes Evon Slack, PA-C  albuterol (VENTOLIN HFA) 108 (90 Base) MCG/ACT inhaler Inhale 1-2 puffs into the lungs every 6 (six) hours as needed for wheezing or shortness of breath.   Yes [provider]  ALPRAZolam (XANAX) 0.25 MG tablet TAKE 1 TO 2 TABLETS BY MOUTH 2 TIMES DAILY AS NEEDED FOR ANXIETY (WILL CAUSE DROWSINESS) Patient taking differently: Take 0.25-0.5  mg by mouth 2 (two) times daily as needed for anxiety. 03/03/23  Yes Sherlene Shams, MD  aspirin 81 MG chewable tablet Chew 81 mg by mouth daily.   Yes [provider]  docusate sodium (COLACE) 100 MG capsule Take 100 mg by mouth daily. 09/13/20  Yes [provider]  lansoprazole (PREVACID) 30 MG capsule Take 1 capsule (30 mg total) by mouth daily at 12 noon. 03/27/23  Yes Sherlene Shams, MD  levothyroxine (SYNTHROID) 50 MCG tablet TAKE 1 TABLET BY MOUTH DAILY BEFORE BREAKFAST. Patient taking differently: Take 50 mcg by mouth daily before breakfast. 12/31/22 12/31/23 Yes Sherlene Shams, MD  loratadine (CLARITIN) 10 MG tablet Take 10 mg by mouth daily as needed.   Yes [provider]  metFORMIN (GLUCOPHAGE) 1000 MG tablet TAKE 1 TABLET BY MOUTH TWICE A DAY WITH A MEAL Patient taking differently: Take 1,000 mg by mouth 2 (two) times daily with a meal. TAKE 1 TABLET BY MOUTH TWICE A DAY WITH A MEAL 02/13/23  Yes Sherlene Shams, MD  methocarbamol (ROBAXIN) 500 MG tablet Take 500 mg by mouth 3 (three) times daily. 04/20/23  Yes [provider]  ondansetron (ZOFRAN) 4 MG tablet Take 1 tablet (4 mg total) by mouth every 6 (six) hours as needed for nausea. 12/31/22  Yes Sherlene Shams, MD  oxybutynin (DITROPAN XL) 15 MG 24 hr tablet Take 1 tablet (15 mg total) by mouth at bedtime. 02/13/23  Yes Sherlene Shams, MD  oxyCODONE (OXY IR/ROXICODONE) 5 MG immediate release tablet Take 0.5-1 tablets (2.5-5 mg total) by mouth every 6 (six) hours as needed for severe pain or breakthrough pain. 03/10/23  Yes Evon Slack, PA-C  pantoprazole (PROTONIX) 40 MG tablet TAKE 1 TABLET BY MOUTH DAILY 04/02/23  Yes Sherlene Shams, MD  QUEtiapine (SEROQUEL) 300 MG tablet TAKE 1 TABLET BY MOUTH EVERYDAY AT BEDTIME Patient taking differently: Take 300 mg by mouth at bedtime. 01/19/23  Yes Sherlene Shams, MD  sertraline (ZOLOFT) 100 MG tablet TAKE 1 TABLET BY MOUTH DAILY Patient taking differently: Take 100 mg by mouth daily. 12/31/22 12/31/23 Yes Sherlene Shams, MD  traMADol (ULTRAM) 50 MG tablet Take 1 tablet (50 mg total) by mouth every 6 (six) hours as needed for moderate pain. 03/10/23  Yes Evon Slack, PA-C  zolpidem (AMBIEN) 5 MG tablet TAKE 1 TABLET BY MOUTH AT BEDTIME AS NEEDED FOR SLEEP Patient taking differently: Take 5 mg by mouth at bedtime as needed for sleep. for sleep 01/06/23  Yes Sherlene Shams, MD  enoxaparin (LOVENOX) 40 MG/0.4ML injection Inject 0.4 mLs (40 mg total) into the skin daily for 14 days. 03/10/23 03/24/23  Evon Slack, PA-C  simvastatin (ZOCOR) 40 MG tablet Take 1 tablet (40 mg total) by mouth daily at 6 PM. 06/27/22 07/28/22  Sherlene Shams, MD  sitaGLIPtin (JANUVIA) 100 MG tablet Take 1 tablet (100 mg total) by mouth daily. 08/08/20 01/22/21  Flinchum, Eula Fried, FNP   CT HIP RIGHT W CONTRAST  Result Date: 04/23/2023 CLINICAL DATA:  Burning and warmth to the right hip incision from the right hip replacement, date of surgery 03/09/2023. Suspected hip replacement infection. Evaluate for CT evidence. EXAM: CT OF  THE LOWER RIGHT EXTREMITY WITH CONTRAST TECHNIQUE: Multidetector CT imaging of the lower right extremity was performed according to the standard protocol following intravenous contrast administration. RADIATION DOSE REDUCTION: This exam was performed according to the departmental dose-optimization program which includes  automated exposure control, adjustment of the mA and/or kV according to patient size and/or use of iterative reconstruction technique. CONTRAST:  75mL OMNIPAQUE IOHEXOL 350 MG/ML SOLN COMPARISON:  CT pelvis and right femur 07/31/2022. FINDINGS: Bones/Joint/Cartilage The bone mineralization appears within normal limits. Right hip total joint replacement is new since 07/31/2022. There is secondary metallic spray artifact associated but no convincing loosening. There is no dislocation. No perihardware fracture is evident and no aggressive regional bone lesion is seen suspicious for particle disease or acute osteomyelitis. The bone around the acetabular component securing screws is unremarkable. There is no substantial joint effusion, allowing for extensive spray artifact at the level of the joint. There are mild spurring changes of the right SI joint, symphysis pubis, L5-S1 facet joints. Ligaments Suboptimally assessed by CT. Muscles and Tendons No acute regional tendon abnormality is seen within the limits of CT technique. There is normal muscle bulk for the patient's age. Within the upper reaches of the vastus lateralis muscle at the level of the subtrochanteric proximal femoral shaft, there is a hypodense faintly rim enhancing collection measuring 2.7 x 2.3 x 3 cm and 41 Hounsfield units, unclear if this is a viscous abscess, hematoma or complex seroma but its contents are above the usual density of fluid. At this same level there is a subcutaneous fluid collection on 7:85, measuring 1.8 x 1.4 cm with a Hounsfield density of 13. This collection may communicate with the intramuscular collection over a  narrow channel on 7:85. There is a small amount of calcific debris within the larger intramuscular collection. No other intramuscular abnormality is seen. No other deep soft tissue pathologic process is suspected. Soft tissues There is stranding around the subcutaneous fluid collection described above, probably inflammatory etiology this far out from surgery. There are mildly enlarged right inguinal chain nodes most likely reactive. Within the right hemipelvis no mass, adenopathy or fluid collections are seen. The visualized pelvic bowel is normal caliber. Uterus is absent. There is trace ascites in the posterior pelvis, nonspecific. The visualized bladder is unremarkable. IMPRESSION: 1. 2.7 x 2.3 x 3 cm hypodense faintly rim enhancing collection in the upper vastus lateralis muscle at the level of the subtrochanteric proximal right femur, unclear if this is a viscous abscess, hematoma or complex seroma but its contents are above that of simple fluid. 2. 1.8 x 1.4 cm subcutaneous fluid collection at this same level, may communicate with the intramuscular collection over a narrow channel on 7:85. 3. Right hip total joint replacement with secondary metallic spray artifact but no convincing evidence of loosening, acute osteomyelitis or perihardware fracture. 4. Mildly enlarged right inguinal chain nodes most likely reactive. 5. Trace ascites in the posterior pelvis, nonspecific. Electronically Signed   By: Almira Bar M.D.   On: 04/23/2023 02:45   CT Angio Chest PE W/Cm &/Or Wo Cm  Result Date: 04/23/2023 CLINICAL DATA:  Pulmonary embolism suspected. Low to intermediate probability. Negative D-dimer. Hypoxemia noted today. Right total hip arthroplasty procedure performed 03/09/2023. EXAM: CT ANGIOGRAPHY CHEST WITH CONTRAST TECHNIQUE: Multidetector CT imaging of the chest was performed using the standard protocol during bolus administration of intravenous contrast. Multiplanar CT image reconstructions and MIPs  were obtained to evaluate the vascular anatomy. RADIATION DOSE REDUCTION: This exam was performed according to the departmental dose-optimization program which includes automated exposure control, adjustment of the mA and/or kV according to patient size and/or use of iterative reconstruction technique. CONTRAST:  75mL OMNIPAQUE IOHEXOL 350 MG/ML SOLN COMPARISON:  Portable chest today, PA  Lat chest 02/13/2023, and CTA chest 12/25/2021. FINDINGS: Cardiovascular: The cardiac size is upper limits of normal. There is no substantial pericardial effusion. The aorta and great vessels are normal. There are no visible coronary artery calcifications. The pulmonary veins are nondistended. The pulmonary trunk is upper limits of normal in caliber. No arterial embolic disease is seen. There are no findings consistent with acute right heart strain. Mediastinum/Nodes: There are bilateral mildly prominent hilar lymph nodes up to 1.1 cm in short axis on both sides. There are few similar sized subcarinal, left-sided prevascular space and right paratracheal nodes. No new or interval enlarged adenopathy is seen. There is no bulky or encasing adenopathy. Thyroid gland and axillary spaces are unremarkable. The thoracic esophagus, thoracic trachea and main bronchi are unremarkable. Lungs/Pleura: There are bilateral minimal layering pleural effusions. There is no pneumothorax. There are posterior basal opacities in both lower lobes which could represent atelectasis or pneumonia, slightly greater on the right. Bronchial thickening is noted in both lower lobes. There is mild posterior atelectasis in the upper lobes. The lungs are otherwise clear.  No lung nodules are seen. Upper Abdomen: No acute abnormality. Status post cholecystectomy. Mild hepatic steatosis. Chronic elevated right hemidiaphragm. Musculoskeletal: There are degenerative changes of the thoracic spine. No acute osseous abnormality or aggressive lesions. No chest wall mass is  seen. Review of the MIP images confirms the above findings. IMPRESSION: 1. Upper limit of normal pulmonary trunk caliber with no evidence of arterial emboli. 2. Borderline cardiomegaly.  No venous dilatation or edema. 3. Bilateral lower lobe posterior basal opacities which could represent atelectasis or pneumonia, with bronchial thickening in both lower lobes. 4. Mildly prominent hilar lymph nodes, but unchanged. 5. Hepatic steatosis. Electronically Signed   By: Almira Bar M.D.   On: 04/23/2023 02:17   CT Head Wo Contrast  Result Date: 04/23/2023 CLINICAL DATA:  Severe headache with mental status changes. EXAM: CT HEAD WITHOUT CONTRAST TECHNIQUE: Contiguous axial images were obtained from the base of the skull through the vertex without intravenous contrast. RADIATION DOSE REDUCTION: This exam was performed according to the departmental dose-optimization program which includes automated exposure control, adjustment of the mA and/or kV according to patient size and/or use of iterative reconstruction technique. COMPARISON:  None Available. FINDINGS: Brain: No evidence of acute infarction, hemorrhage, hydrocephalus, extra-axial collection or mass lesion/mass effect. Vascular: There are scattered calcifications in the carotid siphons. No hyperdense central vessel is seen. Skull: Negative for fractures or focal lesions. Sinuses/Orbits: No acute finding. Other: None. IMPRESSION: No acute intracranial CT findings. Atherosclerosis. Electronically Signed   By: Almira Bar M.D.   On: 04/23/2023 00:07   DG Chest Port 1 View  Result Date: 04/22/2023 CLINICAL DATA:  Questionable sepsis. Check for infiltrates or other abnormality. Headache and neck pain also. EXAM: PORTABLE CHEST 1 VIEW COMPARISON:  PA and lateral study 02/13/2023 FINDINGS: The heart size and mediastinal contours are within normal limits. Both lungs are clear. The visualized skeletal structures are unremarkable. IMPRESSION: No evidence of acute  chest disease.  Stable chest. Electronically Signed   By: Almira Bar M.D.   On: 04/22/2023 23:11    Positive ROS: All other systems have been reviewed and were otherwise negative with the exception of those mentioned in the HPI and as above.  Physical Exam: General:  Alert, no acute distress Psychiatric:  Patient is competent for consent with normal mood and affect   Cardiovascular:  No pedal edema Respiratory:  No wheezing, non-labored breathing GI:  Abdomen is soft and non-tender Skin:  No lesions in the area of chief complaint Neurologic:  Sensation intact distally Lymphatic:  No axillary or cervical lymphadenopathy  Orthopedic Exam:  Right lower extremity exam Skin intact with a well appearing healing incision over the proximal anterior thigh from the previous anterior replacement There is no palpable fluid wave or collection with a generalized small amount of swelling over the incision area consistent with a postoperative 6-week hip There is no surrounding erythema and the wound is well-closed.  No clinical signs for infection on inspection of the hip clinical photo placed in the chart. No pain with passive motion of lower extremity neurovascularly intact with soft compartments able to dorsiflex and plantarflex the foot with an intact dorsalis pedis pulse  Imaging:  CT scan with and without contrast of the right hip images and report reviewed myself.  The hip appears in good position without any evidence of periprosthetic fracture or loosening.  There is no fusion within the hip joint at all noted on these images despite some artifact.  There are 2 very small 2 to 3 cm and 1 to 2 cm subcutaneous fluid collections at the level of the subcutaneous tissue and the vastus lateralis.  They are minimally enhancing and appear complex in nature consistent with a complex postoperative seroma.  Generally agree with radiology interpretation.  Assessment: Sepsis pneumonia status post right  anterior total hip replacement  Plan: Based on the patient's clinical and physical exam I have low clinical concern for any ongoing infection around the wound.  These collections are shown to be relatively superficial on CT scan and are nonpalpable on exam with absolutely no erythema or any signs of infection on clinical exam.  I have a low clinical concern that these are a source of the patient's infection at this time. However as there is some fluid there, if another source is not better identified as causative for her condition, it could be reasonable to have IR do an ultrasound-guided aspiration of the area to rule down infection in the superficial tissue around the hip.  Clinically I have very low suspicion at this time.  I reviewed all these findings with the patient and the medical team and the patient agrees with the above plan to continue treatment per medicine for pneumonia.  All of her questions were answered and we will continue to follow the patient peripherally.      Reinaldo Berber MD  Beeper #:  [id number]  04/23/2023 1:27 PM

## 2023-04-23 NOTE — H&P (Signed)
[place]   PATIENT NAME: Kathleen Carr    MR#:  [id number]  DATE OF BIRTH:  [birth year]  DATE OF ADMISSION:  04/22/2023  PRIMARY CARE PHYSICIAN: Sherlene Shams, MD   Patient is coming from: Home  REQUESTING/REFERRING PHYSICIAN: Chiquita Loth, MD  CHIEF COMPLAINT:   Chief Complaint  Patient presents with   Headache   Fever   Altered Mental Status    HISTORY OF PRESENT ILLNESS:  Kathleen Carr is a 52 y.o. female with medical history significant for asthma, bipolar disorder, type 2 diabetes mellitus, hypertension, hypothyroidism and GERD, presented to the ER with acute onset of fever it was initially 101.3 and later 103 with associated confusion.  Per her wife she kept repeating herself.  She had associated dyspnea and dry cough without wheezing.  She has been having chest discomfort with deep breathing.  No nausea or vomiting or diarrhea or abdominal pain.  No dysuria, oliguria or hematuria or flank pain.  Her mental status was improving in the ER.  She briefly desatted to 87% on room air.  The patient underwent to right hip replacement on 5/13.  ED Course: When she came to the ER, temperature was 100.9 and later 102.5 with BP of 141/87 and heart rate 101.  Labs revealed sodium 133 and CO2 of 18 with creatinine 1.11 and otherwise unremarkable CMP.  Lactic acid was 1.4 and later 1.1 CBC was within normal. EKG as reviewed by me :  EKG showed likely sinus rhythm with a rate of 88 with RSR-and low voltage QRS. Imaging: Portable chest x-ray showed no acute cardiopulmonary disease.  Chest CTA revealed the following: 1. Upper limit of normal pulmonary trunk caliber with no evidence of arterial emboli. 2. Borderline cardiomegaly.  No venous dilatation or edema. 3. Bilateral lower lobe posterior basal opacities which could represent atelectasis or pneumonia, with bronchial thickening in both lower lobes. 4. Mildly prominent hilar lymph nodes, but unchanged. 5. Hepatic steatosis.  Right  hip CT with contrast revealed the following: 1. 2.7 x 2.3 x 3 cm hypodense faintly rim enhancing collection in the upper vastus lateralis muscle at the level of the subtrochanteric proximal right femur, unclear if this is a viscous abscess, hematoma or complex seroma but its contents are above that of simple fluid. 2. 1.8 x 1.4 cm subcutaneous fluid collection at this same level, may communicate with the intramuscular collection over a narrow channel on 7:85. 3. Right hip total joint replacement with secondary metallic spray artifact but no convincing evidence of loosening, acute osteomyelitis or perihardware fracture. 4. Mildly enlarged right inguinal chain nodes most likely reactive. 5. Trace ascites in the posterior pelvis, nonspecific.   She was given a gram of p.o. Tylenol, and later 650 mg p.o., 50 mcg of IV fentanyl, 4 mg of IV morphine sulfate and 4 mg of IV Zofran, IV cefepime, vancomycin and Flagyl initially.  She will be admitted to a medical telemetry bed for further evaluation and management. 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   Pneumonia    PTSD (post-traumatic stress  disorder)    Smoker    Thyroid disease     PAST SURGICAL HISTORY:   Past Surgical History:  Procedure Laterality Date   ABDOMINAL HYSTERECTOMY  12/03/2011   complete   ADENOIDECTOMY, TONSILLECTOMY AND MYRINGOTOMY WITH TUBE PLACEMENT  1977   CARPAL TUNNEL RELEASE Right 03/12/2022   Procedure: RIGHT CARPAL TUNNEL RELEASE;  Surgeon: Marlyne Beards, MD;  Location: [facility];  Service: Orthopedics;  Laterality: Right;   CARPAL TUNNEL RELEASE Left 04/16/2022    Procedure: LEFT CARPAL TUNNEL RELEASE;  Surgeon: Marlyne Beards, MD;  Location: [facility];  Service: Orthopedics;  Laterality: Left;   CHOLECYSTECTOMY  06/08/2011   COLONOSCOPY WITH PROPOFOL N/A 02/05/2022   Procedure: COLONOSCOPY WITH PROPOFOL;  Surgeon: Wyline Mood, MD;  Location: [facility];  Service: Gastroenterology;  Laterality: N/A;   ESOPHAGOGASTRODUODENOSCOPY N/A 02/05/2022   Procedure: ESOPHAGOGASTRODUODENOSCOPY (EGD);  Surgeon: Wyline Mood, MD;  Location: [facility] ENDOSCOPY;  Service: Gastroenterology;  Laterality: N/A;   HIP ARTHROSCOPY Right 08/19/2022   Procedure: Right hip arthroscopy, acetabuloplasty, labral repair, femoral osteochondroplasty, capsular closure;  Surgeon: Signa Kell, MD;  Location: [facility];  Service: Orthopedics;  Laterality: Right;   KNEE ARTHROSCOPY Right 1993   KNEE ARTHROSCOPY Left 1998   LUMBAR LAMINECTOMY Left 2021   TOTAL HIP ARTHROPLASTY Right 03/09/2023   Procedure: TOTAL HIP ARTHROPLASTY ANTERIOR APPROACH;  Surgeon: Reinaldo Berber, MD;  Location: [facility];  Service: Orthopedics;  Laterality: Right;    SOCIAL HISTORY:   Social History   Tobacco Use   Smoking status: Former    Packs/day: 0.50    Years: 42.00    Additional pack years: 0.00    Total pack years: 21.00    Types: Cigarettes    Quit date: 01/26/2023    Years since quitting: 0.2   Smokeless tobacco: Never  Substance Use Topics   Alcohol use: Not Currently    FAMILY HISTORY:   Family History  Problem Relation Age of Onset   Hypertension Mother    Thyroid disease Mother    Lung cancer Father 40       carcinoid, right lung   Ulcerative colitis Father    Renal Disease Maternal Grandfather    Diabetes Maternal Grandfather        Type2   Glaucoma Maternal Grandmother    Stroke Paternal Grandfather    Hypertension Paternal Grandfather     DRUG ALLERGIES:   Allergies  Allergen Reactions   Dust Mite Mixed Allergen Ext [Mite (D. Farinae)]      Respiratory distresss   Nsaids     Acute renal failure   Other Hives    Allergy to Hickory, walnut and Birch trees and all grasses and allergic to Rabbits- patient reports anaphylactic    Sulfa Antibiotics Hives   Latex Itching   Misc. Sulfonamide Containing Compounds Hives    REVIEW OF SYSTEMS:   ROS As per history of present illness. All pertinent systems were reviewed above. Constitutional, HEENT, cardiovascular, respiratory, GI, GU, musculoskeletal, neuro, psychiatric, endocrine, integumentary and hematologic systems were reviewed and are otherwise negative/unremarkable except for positive findings mentioned above in the HPI.   MEDICATIONS AT HOME:   Prior to Admission medications   Medication Sig Start Date End Date Taking? Authorizing Provider  acetaminophen (TYLENOL) 500 MG tablet Take 2 tablets (1,000 mg total) by mouth every 8 (eight) hours. 03/10/23   Evon Slack, PA-C  albuterol (VENTOLIN HFA) 108 (90 Base) MCG/ACT inhaler Inhale 1-2 puffs into the lungs  every 6 (six) hours as needed for wheezing or shortness of breath.    [provider]  ALPRAZolam (XANAX) 0.25 MG tablet TAKE 1 TO 2 TABLETS BY MOUTH 2 TIMES DAILY AS NEEDED FOR ANXIETY (WILL CAUSE DROWSINESS) 03/03/23   Sherlene Shams, MD  aspirin 81 MG chewable tablet Chew 81 mg by mouth daily.    [provider]  docusate sodium (COLACE) 100 MG capsule Take 1 capsule by mouth daily. 09/13/20   [provider]  enoxaparin (LOVENOX) 40 MG/0.4ML injection Inject 0.4 mLs (40 mg total) into the skin daily for 14 days. 03/10/23 03/24/23  Evon Slack, PA-C  lansoprazole (PREVACID) 30 MG capsule Take 1 capsule (30 mg total) by mouth daily at 12 noon. 03/27/23   Sherlene Shams, MD  levothyroxine (SYNTHROID) 50 MCG tablet TAKE 1 TABLET BY MOUTH DAILY BEFORE BREAKFAST. 12/31/22 12/31/23  Sherlene Shams, MD  loratadine (CLARITIN) 10 MG tablet Take 10 mg by mouth daily as needed.    [provider]   metFORMIN (GLUCOPHAGE) 1000 MG tablet TAKE 1 TABLET BY MOUTH TWICE A DAY WITH A MEAL 02/13/23   Sherlene Shams, MD  ondansetron (ZOFRAN) 4 MG tablet Take 1 tablet (4 mg total) by mouth every 6 (six) hours as needed for nausea. 12/31/22   Sherlene Shams, MD  oxybutynin (DITROPAN XL) 15 MG 24 hr tablet Take 1 tablet (15 mg total) by mouth at bedtime. 02/13/23   Sherlene Shams, MD  oxyCODONE (OXY IR/ROXICODONE) 5 MG immediate release tablet Take 0.5-1 tablets (2.5-5 mg total) by mouth every 6 (six) hours as needed for severe pain or breakthrough pain. 03/10/23   Evon Slack, PA-C  pantoprazole (PROTONIX) 40 MG tablet TAKE 1 TABLET BY MOUTH DAILY 04/02/23   Sherlene Shams, MD  QUEtiapine (SEROQUEL) 300 MG tablet TAKE 1 TABLET BY MOUTH EVERYDAY AT BEDTIME 01/19/23   Sherlene Shams, MD  sertraline (ZOLOFT) 100 MG tablet TAKE 1 TABLET BY MOUTH DAILY 12/31/22 12/31/23  Sherlene Shams, MD  traMADol (ULTRAM) 50 MG tablet Take 1 tablet (50 mg total) by mouth every 6 (six) hours as needed for moderate pain. 03/10/23   Evon Slack, PA-C  zolpidem (AMBIEN) 5 MG tablet TAKE 1 TABLET BY MOUTH AT BEDTIME AS NEEDED FOR SLEEP 01/06/23   Sherlene Shams, MD  simvastatin (ZOCOR) 40 MG tablet Take 1 tablet (40 mg total) by mouth daily at 6 PM. 06/27/22 07/28/22  Sherlene Shams, MD  sitaGLIPtin (JANUVIA) 100 MG tablet Take 1 tablet (100 mg total) by mouth daily. 08/08/20 01/22/21  Flinchum, Eula Fried, FNP      VITAL SIGNS:  Blood pressure 105/66, pulse 78, temperature 99.1 F (37.3 C), temperature source Oral, resp. rate 15, height 5\' 4"  (1.626 m), weight 72.6 kg, SpO2 97 %.  PHYSICAL EXAMINATION:  Physical Exam  GENERAL:  52 y.o.-year-old patient lying in the bed with mild respiratory distress with conversational dyspnea.  She was somnolent but arousable. EYES: Pupils equal, round, reactive to light and accommodation. No scleral icterus. Extraocular muscles intact.  HEENT: Head atraumatic, normocephalic.  Oropharynx and nasopharynx clear.  NECK:  Supple, no jugular venous distention. No thyroid enlargement, no tenderness.  LUNGS: Diminished bibasilar breath sounds with bibasal crackles.  No use of accessory muscles of respiration.  CARDIOVASCULAR: Regular rate and rhythm, S1, S2 normal. No murmurs, rubs, or gallops.  ABDOMEN: Soft, nondistended, nontender. Bowel sounds present. No organomegaly or mass.  EXTREMITIES:  No pedal edema, cyanosis, or clubbing.  NEUROLOGIC: Cranial nerves II through XII are intact. Muscle strength 5/5 in all extremities. Sensation intact. Gait not checked.  PSYCHIATRIC: The patient is alert and oriented x 3.  Normal affect and good eye contact. SKIN: No obvious rash, lesion, or ulcer.   LABORATORY PANEL:   CBC Recent Labs  Lab 04/23/23 0452  WBC 7.7  HGB 10.7*  HCT 33.1*  PLT 277   ------------------------------------------------------------------------------------------------------------------  Chemistries  Recent Labs  Lab 04/23/23 0452  NA 133*  K 3.5  CL 107  CO2 21*  GLUCOSE 99  BUN 13  CREATININE 1.07*  1.08*  CALCIUM 8.0*  AST 17  ALT 15  ALKPHOS 71  BILITOT 0.6   ------------------------------------------------------------------------------------------------------------------  Cardiac Enzymes No results for input(s): "TROPONINI" in the last 168 hours. ------------------------------------------------------------------------------------------------------------------  RADIOLOGY:  CT HIP RIGHT W CONTRAST  Result Date: 04/23/2023 CLINICAL DATA:  Burning and warmth to the right hip incision from the right hip replacement, date of surgery 03/09/2023. Suspected hip replacement infection. Evaluate for CT evidence. EXAM: CT OF THE LOWER RIGHT EXTREMITY WITH CONTRAST TECHNIQUE: Multidetector CT imaging of the lower right extremity was performed according to the standard protocol following intravenous contrast administration. RADIATION DOSE  REDUCTION: This exam was performed according to the departmental dose-optimization program which includes automated exposure control, adjustment of the mA and/or kV according to patient size and/or use of iterative reconstruction technique. CONTRAST:  75mL OMNIPAQUE IOHEXOL 350 MG/ML SOLN COMPARISON:  CT pelvis and right femur 07/31/2022. FINDINGS: Bones/Joint/Cartilage The bone mineralization appears within normal limits. Right hip total joint replacement is new since 07/31/2022. There is secondary metallic spray artifact associated but no convincing loosening. There is no dislocation. No perihardware fracture is evident and no aggressive regional bone lesion is seen suspicious for particle disease or acute osteomyelitis. The bone around the acetabular component securing screws is unremarkable. There is no substantial joint effusion, allowing for extensive spray artifact at the level of the joint. There are mild spurring changes of the right SI joint, symphysis pubis, L5-S1 facet joints. Ligaments Suboptimally assessed by CT. Muscles and Tendons No acute regional tendon abnormality is seen within the limits of CT technique. There is normal muscle bulk for the patient's age. Within the upper reaches of the vastus lateralis muscle at the level of the subtrochanteric proximal femoral shaft, there is a hypodense faintly rim enhancing collection measuring 2.7 x 2.3 x 3 cm and 41 Hounsfield units, unclear if this is a viscous abscess, hematoma or complex seroma but its contents are above the usual density of fluid. At this same level there is a subcutaneous fluid collection on 7:85, measuring 1.8 x 1.4 cm with a Hounsfield density of 13. This collection may communicate with the intramuscular collection over a narrow channel on 7:85. There is a small amount of calcific debris within the larger intramuscular collection. No other intramuscular abnormality is seen. No other deep soft tissue pathologic process is suspected.  Soft tissues There is stranding around the subcutaneous fluid collection described above, probably inflammatory etiology this far out from surgery. There are mildly enlarged right inguinal chain nodes most likely reactive. Within the right hemipelvis no mass, adenopathy or fluid collections are seen. The visualized pelvic bowel is normal caliber. Uterus is absent. There is trace ascites in the posterior pelvis, nonspecific. The visualized bladder is unremarkable. IMPRESSION: 1. 2.7 x 2.3 x 3 cm hypodense faintly rim enhancing collection in the upper vastus lateralis muscle at the level  of the subtrochanteric proximal right femur, unclear if this is a viscous abscess, hematoma or complex seroma but its contents are above that of simple fluid. 2. 1.8 x 1.4 cm subcutaneous fluid collection at this same level, may communicate with the intramuscular collection over a narrow channel on 7:85. 3. Right hip total joint replacement with secondary metallic spray artifact but no convincing evidence of loosening, acute osteomyelitis or perihardware fracture. 4. Mildly enlarged right inguinal chain nodes most likely reactive. 5. Trace ascites in the posterior pelvis, nonspecific. Electronically Signed   By: Almira Bar M.D.   On: 04/23/2023 02:45   CT Angio Chest PE W/Cm &/Or Wo Cm  Result Date: 04/23/2023 CLINICAL DATA:  Pulmonary embolism suspected. Low to intermediate probability. Negative D-dimer. Hypoxemia noted today. Right total hip arthroplasty procedure performed 03/09/2023. EXAM: CT ANGIOGRAPHY CHEST WITH CONTRAST TECHNIQUE: Multidetector CT imaging of the chest was performed using the standard protocol during bolus administration of intravenous contrast. Multiplanar CT image reconstructions and MIPs were obtained to evaluate the vascular anatomy. RADIATION DOSE REDUCTION: This exam was performed according to the departmental dose-optimization program which includes automated exposure control, adjustment of the  mA and/or kV according to patient size and/or use of iterative reconstruction technique. CONTRAST:  75mL OMNIPAQUE IOHEXOL 350 MG/ML SOLN COMPARISON:  Portable chest today, PA Lat chest 02/13/2023, and CTA chest 12/25/2021. FINDINGS: Cardiovascular: The cardiac size is upper limits of normal. There is no substantial pericardial effusion. The aorta and great vessels are normal. There are no visible coronary artery calcifications. The pulmonary veins are nondistended. The pulmonary trunk is upper limits of normal in caliber. No arterial embolic disease is seen. There are no findings consistent with acute right heart strain. Mediastinum/Nodes: There are bilateral mildly prominent hilar lymph nodes up to 1.1 cm in short axis on both sides. There are few similar sized subcarinal, left-sided prevascular space and right paratracheal nodes. No new or interval enlarged adenopathy is seen. There is no bulky or encasing adenopathy. Thyroid gland and axillary spaces are unremarkable. The thoracic esophagus, thoracic trachea and main bronchi are unremarkable. Lungs/Pleura: There are bilateral minimal layering pleural effusions. There is no pneumothorax. There are posterior basal opacities in both lower lobes which could represent atelectasis or pneumonia, slightly greater on the right. Bronchial thickening is noted in both lower lobes. There is mild posterior atelectasis in the upper lobes. The lungs are otherwise clear.  No lung nodules are seen. Upper Abdomen: No acute abnormality. Status post cholecystectomy. Mild hepatic steatosis. Chronic elevated right hemidiaphragm. Musculoskeletal: There are degenerative changes of the thoracic spine. No acute osseous abnormality or aggressive lesions. No chest wall mass is seen. Review of the MIP images confirms the above findings. IMPRESSION: 1. Upper limit of normal pulmonary trunk caliber with no evidence of arterial emboli. 2. Borderline cardiomegaly.  No venous dilatation or edema.  3. Bilateral lower lobe posterior basal opacities which could represent atelectasis or pneumonia, with bronchial thickening in both lower lobes. 4. Mildly prominent hilar lymph nodes, but unchanged. 5. Hepatic steatosis. Electronically Signed   By: Almira Bar M.D.   On: 04/23/2023 02:17   CT Head Wo Contrast  Result Date: 04/23/2023 CLINICAL DATA:  Severe headache with mental status changes. EXAM: CT HEAD WITHOUT CONTRAST TECHNIQUE: Contiguous axial images were obtained from the base of the skull through the vertex without intravenous contrast. RADIATION DOSE REDUCTION: This exam was performed according to the departmental dose-optimization program which includes automated exposure control, adjustment of the mA and/or  kV according to patient size and/or use of iterative reconstruction technique. COMPARISON:  None Available. FINDINGS: Brain: No evidence of acute infarction, hemorrhage, hydrocephalus, extra-axial collection or mass lesion/mass effect. Vascular: There are scattered calcifications in the carotid siphons. No hyperdense central vessel is seen. Skull: Negative for fractures or focal lesions. Sinuses/Orbits: No acute finding. Other: None. IMPRESSION: No acute intracranial CT findings. Atherosclerosis. Electronically Signed   By: Almira Bar M.D.   On: 04/23/2023 00:07   DG Chest Port 1 View  Result Date: 04/22/2023 CLINICAL DATA:  Questionable sepsis. Check for infiltrates or other abnormality. Headache and neck pain also. EXAM: PORTABLE CHEST 1 VIEW COMPARISON:  PA and lateral study 02/13/2023 FINDINGS: The heart size and mediastinal contours are within normal limits. Both lungs are clear. The visualized skeletal structures are unremarkable. IMPRESSION: No evidence of acute chest disease.  Stable chest. Electronically Signed   By: Almira Bar M.D.   On: 04/22/2023 23:11      IMPRESSION AND PLAN:  Assessment and Plan: * Sepsis due to pneumonia (HCC) - Sepsis manifested by fever,  tachycardia and tachypnea to 24. - It is due to bibasal community-acquired pneumonia likely bacterial. - She has subsequent acute metabolic encephalopathy and hypoxemia. - She will be admitted to a medical telemetry bed. - Will continue antibiotic therapy with IV Rocephin and Zithromax. - We will follow blood cultures. - Continue hydration with IV lactated ringer. - Bronchodilator therapy as well as mucolytic therapy will be provided.  Type 2 diabetes mellitus without complications (HCC) - We will place her on supplemental coverage with NovoLog. - We will hold off metformin.  Depression - We will continue Zoloft and Seroquel.  GERD without esophagitis - We will continue PPI therapy.  Hypothyroidism - We will continue Synthroid.   DVT prophylaxis: Lovenox.  Advanced Care Planning:  Code Status: full code.  Family Communication:  The plan of care was discussed in details with the patient (and family). I answered all questions. The patient agreed to proceed with the above mentioned plan. Further management will depend upon hospital course. Disposition Plan: Back to previous home environment Consults called: none.  All the records are reviewed and case discussed with ED provider.  Status is: Inpatient  At the time of the admission, it appears that the appropriate admission status for this patient is inpatient.  This is judged to be reasonable and necessary in order to provide the required intensity of service to ensure the patient's safety given the presenting symptoms, physical exam findings and initial radiographic and laboratory data in the context of comorbid conditions.  The patient requires inpatient status due to high intensity of service, high risk of further deterioration and high frequency of surveillance required.  I certify that at the time of admission, it is my clinical judgment that the patient will require inpatient hospital care extending more than 2 midnights.                             Dispo: The patient is from: Home              Anticipated d/c is to: Home              Patient currently is not medically stable to d/c.              Difficult to place patient: No  Hannah Beat M.D on 04/23/2023 at 7:11 AM  [facility]  From 7 PM-7 AM, contact night-coverage [email or web address]  CC: Primary care physician; Sherlene Shams, MD

## 2023-04-23 NOTE — Assessment & Plan Note (Signed)
-   We will continue Zoloft and Seroquel. 

## 2023-04-23 NOTE — Assessment & Plan Note (Signed)
-   We will continue PPI therapy 

## 2023-04-23 NOTE — ED Provider Notes (Signed)
-----------------------------------------   12:53 AM on 04/23/2023 -----------------------------------------   Care assumed from previous provider.  In summary, this is a 52 year old female presenting with fever and altered mental status.  Right total hip repair 1 month ago.  Seen by orthopedist yesterday in follow-up.  Currently developed fever and increased pain in her right hip.  Also complaining of headache.  Family member as well as Engineer, civil (consulting) both state that patient's confusion has cleared.  Reviewing her medication list, I see patient is on Zoloft and Seroquel.  Consider serotonin syndrome although patient does not exhibit rigidity.  Neck is supple without meningismus.  She is now alert and oriented x 3.  Low suspicion for meningitis.  Reportedly patient had hypoxia on room air and is currently on 2 L nasal cannula oxygen.  Right hip incision is warm, erythematous and swollen per family members report.  Will obtain CTA chest to evaluate for PE and CT right hip with contrast to evaluate postoperative infection.   ----------------------------------------- 2:55 AM on 04/23/2023 -----------------------------------------   CTA chest demonstrates no PE; developing bibasilar opacities concerning for pneumonia.  CT hip concerning for fluid collection -seroma versus hematoma versus abscess.  Will consult hospitalist services for evaluation and admission.  Remains febrile; cannot take NSAIDs secondary to acute renal failure.  Will discuss with hospitalist.   Irean Hong, MD 04/23/23 [phone]

## 2023-04-23 NOTE — Assessment & Plan Note (Signed)
-   We will place her on supplemental coverage with NovoLog. - We will hold off metformin. 

## 2023-04-23 NOTE — Plan of Care (Signed)

## 2023-04-23 NOTE — Assessment & Plan Note (Addendum)
-   Sepsis manifested by fever, tachycardia and tachypnea to 24. - It is due to bibasal community-acquired pneumonia likely bacterial. - She has subsequent acute metabolic encephalopathy and hypoxemia. - She will be admitted to a medical telemetry bed. - Will continue antibiotic therapy with IV Rocephin and Zithromax. - We will follow blood cultures. - Continue hydration with IV lactated ringer. - Bronchodilator therapy as well as mucolytic therapy will be provided.

## 2023-04-23 NOTE — ED Notes (Signed)
Attempted to wean off 2L [place], patient O2 sat 88-90%; will place back on 2L.

## 2023-04-24 ENCOUNTER — Encounter: Payer: Self-pay | Admitting: Family Medicine

## 2023-04-24 DIAGNOSIS — J189 Pneumonia, unspecified organism: Secondary | ICD-10-CM | POA: Diagnosis not present

## 2023-04-24 DIAGNOSIS — A419 Sepsis, unspecified organism: Secondary | ICD-10-CM | POA: Diagnosis not present

## 2023-04-24 LAB — BASIC METABOLIC PANEL
Anion gap: 8 (ref 5–15)
BUN: 10 mg/dL (ref 6–20)
CO2: 24 mmol/L (ref 22–32)
Calcium: 8.8 mg/dL — ABNORMAL LOW (ref 8.9–10.3)
Chloride: 108 mmol/L (ref 98–111)
Creatinine, Ser: 0.79 mg/dL (ref 0.44–1.00)
GFR, Estimated: >60 mL/min (ref >60)
Glucose, Bld: 112 mg/dL — ABNORMAL HIGH (ref 70–99)
Potassium: 3.9 mmol/L (ref 3.5–5.1)
Sodium: 137 mmol/L (ref 135–145)

## 2023-04-24 LAB — RESPIRATORY PANEL BY PCR

## 2023-04-24 LAB — CBC WITH DIFFERENTIAL/PLATELET
Abs Immature Granulocytes: 0.02 10*3/uL (ref 0.00–0.07)
Basophils Absolute: 0 10*3/uL (ref 0.0–0.1)
Basophils Relative: 1 %
Eosinophils Absolute: 0.1 10*3/uL (ref 0.0–0.5)
Eosinophils Relative: 2 %
HCT: 32.9 % — ABNORMAL LOW (ref 36.0–46.0)
Hemoglobin: 10.8 g/dL — ABNORMAL LOW (ref 12.0–15.0)
Immature Granulocytes: 1 %
Lymphocytes Relative: 40 %
Lymphs Abs: 1.7 10*3/uL (ref 0.7–4.0)
MCH: 30.9 pg (ref 26.0–34.0)
MCHC: 32.8 g/dL (ref 30.0–36.0)
MCV: 94 fL (ref 80.0–100.0)
Monocytes Absolute: 0.4 10*3/uL (ref 0.1–1.0)
Monocytes Relative: 9 %
Neutro Abs: 2 10*3/uL (ref 1.7–7.7)
Neutrophils Relative %: 47 %
Platelets: 234 10*3/uL (ref 150–400)
RBC: 3.5 MIL/uL — ABNORMAL LOW (ref 3.87–5.11)
RDW: 13.1 % (ref 11.5–15.5)
WBC: 4.2 10*3/uL (ref 4.0–10.5)
nRBC: 0 % (ref 0.0–0.2)

## 2023-04-24 LAB — CULTURE, BLOOD (ROUTINE X 2): Culture: NO GROWTH

## 2023-04-24 LAB — PROCALCITONIN: Procalcitonin: 0.15 ng/mL

## 2023-04-24 NOTE — Progress Notes (Signed)
PROGRESS NOTE    Kathleen Carr  ZOX:[id number] DOB: 18-Oct-1971 DOA: 04/22/2023 PCP: Sherlene Shams, MD    Brief Narrative:  52 year old female with PMH of asthma, bipolar, type 2 diabetes mellitus, hypertension, hypothyroid who presents to the ED with acute onset fever Tmax 103 with associated dyspnea and dry cough.  Pleuritic chest pain with deep breathing.   Patient had a  recently performed hip replacement.  She was seen in orthopedic office on the day prior to presentation to the ER.  Wound look good.  CT of hip performed on arrival to the ED demonstrated a intramuscular fluid collection.  Discussed case with orthopedics Dr. Audelia Acton.  Evaluated patient at bedside.  Surgical site looks clean.  Suspected that fluid collection is a seroma.  Per orthopedics unlikely to be source of infection.   6/28:  Breakthrough fever noted.  Mental status improved   Assessment & Plan:   Principal Problem:   Sepsis due to pneumonia [facility]) Active Problems:   Hypothyroidism   GERD without esophagitis   Depression   Type 2 diabetes mellitus without complications (HCC)   Acute metabolic encephalopathy   Hypoxemia  Sepsis Suspected secondary to pneumonia.  Manifested by fever tachycardia and tachypnea Mild bibasilar infiltrates and bronchial thickening noted on imaging Started on broad-spectrum antibiotics azithromycin and Rocephin on admission Plan: Bradycardia fever noted Continue empiric antibiotics Check respiratory viral panel Follow cultures Mucolytic's and bronchodilators  Type 2 diabetes mellitus without complication Well-controlled.  Okay to continue metformin  Depression Zoloft and Seroquel  GERD PPI  Hypothyroidism Synthroid   DVT prophylaxis: SQ lovenox Code Status: FULL Family Communication:Partner at bedside 6/28 Disposition Plan: Status is: Inpatient Remains inpatient appropriate because: Fever, suspected sepsis on IV antibiotics none   Level of care: Telemetry  Medical  Consultants:  None  Procedures:  None  Antimicrobials: Rocephin Azithromycin   Subjective: Seen and examined.  Resting in bed.  Starting to feel better.  No complaints this morning.  Objective: Vitals:   04/23/23 1939 04/23/23 2229 04/24/23 0354 04/24/23 0823  BP: (!) 107/56  101/62 (!) 91/59  Pulse: 63  66 72  Resp: 18   18  Temp: (!) 101.1 F (38.4 C)  98.6 F (37 C) 99.4 F (37.4 C)  TempSrc: Oral  Oral   SpO2: 97% 97% 98% 95%  Weight:   81.7 kg   Height:        Intake/Output Summary (Last 24 hours) at 04/24/2023 1204 Last data filed at 04/24/2023 8119 Gross per 24 hour  Intake 250 ml  Output --  Net 250 ml   Filed Weights   04/22/23 2220 04/24/23 0354  Weight: 72.6 kg 81.7 kg    Examination:  General exam: Appears calm and comfortable  Respiratory system: Mild bibasal crackles.  Normal work of breathing.  Room air Cardiovascular system: S1-S2, RRR, no murmurs, no pedal edema Gastrointestinal system: Soft, NT/ND, normal bowel sounds Central nervous system: Alert and oriented. No focal neurological deficits. Extremities: Symmetric 5 x 5 power. Skin: No rashes, lesions or ulcers Psychiatry: Judgement and insight appear normal. Mood & affect appropriate.     Data Reviewed: I have personally reviewed following labs and imaging studies  CBC: Recent Labs  Lab 04/22/23 2242 04/23/23 0452 04/24/23 0811  WBC 8.4 7.7 4.2  NEUTROABS 5.3  --  2.0  HGB 12.6 10.7* 10.8*  HCT 38.3 33.1* 32.9*  MCV 93.6 96.8 94.0  PLT 335 277 234   Basic Metabolic Panel: Recent Labs  Lab 04/22/23 2242 04/23/23 0452 04/24/23 0811  NA 133* 133* 137  K 3.9 3.5 3.9  CL 104 107 108  CO2 18* 21* 24  GLUCOSE 113* 99 112*  BUN 15 13 10   CREATININE 1.11* 1.07*  1.08* 0.79  CALCIUM 9.0 8.0* 8.8*   GFR: Estimated Creatinine Clearance: 86 mL/min (by C-G formula based on SCr of 0.79 mg/dL). Liver Function Tests: Recent Labs  Lab 04/22/23 2242 04/23/23 0452   AST 22 17  ALT 17 15  ALKPHOS 74 71  BILITOT 0.5 0.6  PROT 7.8 5.9*  ALBUMIN 4.7 3.3*   No results for input(s): "LIPASE", "AMYLASE" in the last 168 hours. Recent Labs  Lab 04/22/23 2242  AMMONIA 21   Coagulation Profile: Recent Labs  Lab 04/22/23 2242 04/23/23 0452  INR 0.9 0.9   Cardiac Enzymes: No results for input(s): "CKTOTAL", "CKMB", "CKMBINDEX", "TROPONINI" in the last 168 hours. BNP (last 3 results) No results for input(s): "PROBNP" in the last 8760 hours. HbA1C: No results for input(s): "HGBA1C" in the last 72 hours. CBG: Recent Labs  Lab 04/22/23 2234  GLUCAP 117*   Lipid Profile: No results for input(s): "CHOL", "HDL", "LDLCALC", "TRIG", "CHOLHDL", "LDLDIRECT" in the last 72 hours. Thyroid Function Tests: Recent Labs    04/22/23 2242  TSH 0.600  FREET4 0.84   Anemia Panel: No results for input(s): "VITAMINB12", "FOLATE", "FERRITIN", "TIBC", "IRON", "RETICCTPCT" in the last 72 hours. Sepsis Labs: Recent Labs  Lab 04/22/23 2242 04/23/23 0139 04/23/23 0452 04/24/23 0811  PROCALCITON  --   --  0.22 0.15  LATICACIDVEN 1.4 1.1  --   --     Recent Results (from the past 240 hour(s))  Resp panel by RT-PCR (RSV, Flu A&B, Covid) Anterior Nasal Swab     Status: None   Collection Time: 04/22/23 10:42 PM   Specimen: Anterior Nasal Swab  Result Value Ref Range Status   SARS Coronavirus 2 by RT PCR NEGATIVE NEGATIVE Final    Comment: (NOTE) SARS-CoV-2 target nucleic acids are NOT DETECTED.  The SARS-CoV-2 RNA is generally detectable in upper respiratory specimens during the acute phase of infection. The lowest concentration of SARS-CoV-2 viral copies this assay can detect is 138 copies/mL. A negative result does not preclude SARS-Cov-2 infection and should not be used as the sole basis for treatment or other patient management decisions. A negative result may occur with  improper specimen collection/handling, submission of specimen other than  nasopharyngeal swab, presence of viral mutation(s) within the areas targeted by this assay, and inadequate number of viral copies(<138 copies/mL). A negative result must be combined with clinical observations, patient history, and epidemiological information. The expected result is Negative.  Fact Sheet for Patients:  BloggerCourse.com  Fact Sheet for Healthcare Providers:  SeriousBroker.it  This test is no t yet approved or cleared by the [place] FDA and  has been authorized for detection and/or diagnosis of SARS-CoV-2 by FDA under an Emergency Use Authorization (EUA). This EUA will remain  in effect (meaning this test can be used) for the duration of the COVID-19 declaration under Section 564(b)(1) of the Act, 21 U.S.C.section 360bbb-3(b)(1), unless the authorization is terminated  or revoked sooner.       Influenza A by PCR NEGATIVE NEGATIVE Final   Influenza B by PCR NEGATIVE NEGATIVE Final    Comment: (NOTE) The Xpert Xpress SARS-CoV-2/FLU/RSV plus assay is intended as an aid in the diagnosis of influenza from Nasopharyngeal swab specimens and should not be used as  a sole basis for treatment. Nasal washings and aspirates are unacceptable for Xpert Xpress SARS-CoV-2/FLU/RSV testing.  Fact Sheet for Patients: BloggerCourse.com  Fact Sheet for Healthcare Providers: SeriousBroker.it  This test is not yet approved or cleared by the [place] FDA and has been authorized for detection and/or diagnosis of SARS-CoV-2 by FDA under an Emergency Use Authorization (EUA). This EUA will remain in effect (meaning this test can be used) for the duration of the COVID-19 declaration under Section 564(b)(1) of the Act, 21 U.S.C. section 360bbb-3(b)(1), unless the authorization is terminated or revoked.     Resp Syncytial Virus by PCR NEGATIVE NEGATIVE Final    Comment:  (NOTE) Fact Sheet for Patients: BloggerCourse.com  Fact Sheet for Healthcare Providers: SeriousBroker.it  This test is not yet approved or cleared by the [place] FDA and has been authorized for detection and/or diagnosis of SARS-CoV-2 by FDA under an Emergency Use Authorization (EUA). This EUA will remain in effect (meaning this test can be used) for the duration of the COVID-19 declaration under Section 564(b)(1) of the Act, 21 U.S.C. section 360bbb-3(b)(1), unless the authorization is terminated or revoked.  Performed at [facility], [address] [place], [place] [address]   Blood Culture (routine x 2)     Status: None (Preliminary result)   Collection Time: 04/22/23 10:42 PM   Specimen: BLOOD  Result Value Ref Range Status   Specimen Description BLOOD LEFT HAND  Final   Special Requests   Final    BOTTLES DRAWN AEROBIC AND ANAEROBIC Blood Culture results may not be optimal due to an inadequate volume of blood received in culture bottles   Culture   Final    NO GROWTH 2 DAYS Performed at [facility], [address] [place], [place] [address]    Report Status PENDING  Incomplete  Blood Culture (routine x 2)     Status: None (Preliminary result)   Collection Time: 04/22/23 10:42 PM   Specimen: BLOOD  Result Value Ref Range Status   Specimen Description BLOOD RIGHT ASSIST CONTROL  Final   Special Requests   Final    BOTTLES DRAWN AEROBIC AND ANAEROBIC Blood Culture adequate volume   Culture   Final    NO GROWTH 2 DAYS Performed at [facility], [address] [place], [place] [address]    Report Status PENDING  Incomplete         Radiology Studies: CT HIP RIGHT W CONTRAST  Result Date: 04/23/2023 CLINICAL DATA:  Burning and warmth to the right hip incision from the right hip replacement, date of surgery 03/09/2023. Suspected hip replacement infection. Evaluate for CT evidence.  EXAM: CT OF THE LOWER RIGHT EXTREMITY WITH CONTRAST TECHNIQUE: Multidetector CT imaging of the lower right extremity was performed according to the standard protocol following intravenous contrast administration. RADIATION DOSE REDUCTION: This exam was performed according to the departmental dose-optimization program which includes automated exposure control, adjustment of the mA and/or kV according to patient size and/or use of iterative reconstruction technique. CONTRAST:  75mL OMNIPAQUE IOHEXOL 350 MG/ML SOLN COMPARISON:  CT pelvis and right femur 07/31/2022. FINDINGS: Bones/Joint/Cartilage The bone mineralization appears within normal limits. Right hip total joint replacement is new since 07/31/2022. There is secondary metallic spray artifact associated but no convincing loosening. There is no dislocation. No perihardware fracture is evident and no aggressive regional bone lesion is seen suspicious for particle disease or acute osteomyelitis. The bone around the acetabular component securing screws is unremarkable. There is  no substantial joint effusion, allowing for extensive spray artifact at the level of the joint. There are mild spurring changes of the right SI joint, symphysis pubis, L5-S1 facet joints. Ligaments Suboptimally assessed by CT. Muscles and Tendons No acute regional tendon abnormality is seen within the limits of CT technique. There is normal muscle bulk for the patient's age. Within the upper reaches of the vastus lateralis muscle at the level of the subtrochanteric proximal femoral shaft, there is a hypodense faintly rim enhancing collection measuring 2.7 x 2.3 x 3 cm and 41 Hounsfield units, unclear if this is a viscous abscess, hematoma or complex seroma but its contents are above the usual density of fluid. At this same level there is a subcutaneous fluid collection on 7:85, measuring 1.8 x 1.4 cm with a Hounsfield density of 13. This collection may communicate with the intramuscular  collection over a narrow channel on 7:85. There is a small amount of calcific debris within the larger intramuscular collection. No other intramuscular abnormality is seen. No other deep soft tissue pathologic process is suspected. Soft tissues There is stranding around the subcutaneous fluid collection described above, probably inflammatory etiology this far out from surgery. There are mildly enlarged right inguinal chain nodes most likely reactive. Within the right hemipelvis no mass, adenopathy or fluid collections are seen. The visualized pelvic bowel is normal caliber. Uterus is absent. There is trace ascites in the posterior pelvis, nonspecific. The visualized bladder is unremarkable. IMPRESSION: 1. 2.7 x 2.3 x 3 cm hypodense faintly rim enhancing collection in the upper vastus lateralis muscle at the level of the subtrochanteric proximal right femur, unclear if this is a viscous abscess, hematoma or complex seroma but its contents are above that of simple fluid. 2. 1.8 x 1.4 cm subcutaneous fluid collection at this same level, may communicate with the intramuscular collection over a narrow channel on 7:85. 3. Right hip total joint replacement with secondary metallic spray artifact but no convincing evidence of loosening, acute osteomyelitis or perihardware fracture. 4. Mildly enlarged right inguinal chain nodes most likely reactive. 5. Trace ascites in the posterior pelvis, nonspecific. Electronically Signed   By: Almira Bar M.D.   On: 04/23/2023 02:45   CT Angio Chest PE W/Cm &/Or Wo Cm  Result Date: 04/23/2023 CLINICAL DATA:  Pulmonary embolism suspected. Low to intermediate probability. Negative D-dimer. Hypoxemia noted today. Right total hip arthroplasty procedure performed 03/09/2023. EXAM: CT ANGIOGRAPHY CHEST WITH CONTRAST TECHNIQUE: Multidetector CT imaging of the chest was performed using the standard protocol during bolus administration of intravenous contrast. Multiplanar CT image  reconstructions and MIPs were obtained to evaluate the vascular anatomy. RADIATION DOSE REDUCTION: This exam was performed according to the departmental dose-optimization program which includes automated exposure control, adjustment of the mA and/or kV according to patient size and/or use of iterative reconstruction technique. CONTRAST:  75mL OMNIPAQUE IOHEXOL 350 MG/ML SOLN COMPARISON:  Portable chest today, PA Lat chest 02/13/2023, and CTA chest 12/25/2021. FINDINGS: Cardiovascular: The cardiac size is upper limits of normal. There is no substantial pericardial effusion. The aorta and great vessels are normal. There are no visible coronary artery calcifications. The pulmonary veins are nondistended. The pulmonary trunk is upper limits of normal in caliber. No arterial embolic disease is seen. There are no findings consistent with acute right heart strain. Mediastinum/Nodes: There are bilateral mildly prominent hilar lymph nodes up to 1.1 cm in short axis on both sides. There are few similar sized subcarinal, left-sided prevascular space and right paratracheal nodes.  No new or interval enlarged adenopathy is seen. There is no bulky or encasing adenopathy. Thyroid gland and axillary spaces are unremarkable. The thoracic esophagus, thoracic trachea and main bronchi are unremarkable. Lungs/Pleura: There are bilateral minimal layering pleural effusions. There is no pneumothorax. There are posterior basal opacities in both lower lobes which could represent atelectasis or pneumonia, slightly greater on the right. Bronchial thickening is noted in both lower lobes. There is mild posterior atelectasis in the upper lobes. The lungs are otherwise clear.  No lung nodules are seen. Upper Abdomen: No acute abnormality. Status post cholecystectomy. Mild hepatic steatosis. Chronic elevated right hemidiaphragm. Musculoskeletal: There are degenerative changes of the thoracic spine. No acute osseous abnormality or aggressive lesions.  No chest wall mass is seen. Review of the MIP images confirms the above findings. IMPRESSION: 1. Upper limit of normal pulmonary trunk caliber with no evidence of arterial emboli. 2. Borderline cardiomegaly.  No venous dilatation or edema. 3. Bilateral lower lobe posterior basal opacities which could represent atelectasis or pneumonia, with bronchial thickening in both lower lobes. 4. Mildly prominent hilar lymph nodes, but unchanged. 5. Hepatic steatosis. Electronically Signed   By: Almira Bar M.D.   On: 04/23/2023 02:17   CT Head Wo Contrast  Result Date: 04/23/2023 CLINICAL DATA:  Severe headache with mental status changes. EXAM: CT HEAD WITHOUT CONTRAST TECHNIQUE: Contiguous axial images were obtained from the base of the skull through the vertex without intravenous contrast. RADIATION DOSE REDUCTION: This exam was performed according to the departmental dose-optimization program which includes automated exposure control, adjustment of the mA and/or kV according to patient size and/or use of iterative reconstruction technique. COMPARISON:  None Available. FINDINGS: Brain: No evidence of acute infarction, hemorrhage, hydrocephalus, extra-axial collection or mass lesion/mass effect. Vascular: There are scattered calcifications in the carotid siphons. No hyperdense central vessel is seen. Skull: Negative for fractures or focal lesions. Sinuses/Orbits: No acute finding. Other: None. IMPRESSION: No acute intracranial CT findings. Atherosclerosis. Electronically Signed   By: Almira Bar M.D.   On: 04/23/2023 00:07   DG Chest Port 1 View  Result Date: 04/22/2023 CLINICAL DATA:  Questionable sepsis. Check for infiltrates or other abnormality. Headache and neck pain also. EXAM: PORTABLE CHEST 1 VIEW COMPARISON:  PA and lateral study 02/13/2023 FINDINGS: The heart size and mediastinal contours are within normal limits. Both lungs are clear. The visualized skeletal structures are unremarkable. IMPRESSION: No  evidence of acute chest disease.  Stable chest. Electronically Signed   By: Almira Bar M.D.   On: 04/22/2023 23:11        Scheduled Meds:  aspirin  81 mg Oral Daily   docusate sodium  100 mg Oral Daily   enoxaparin (LOVENOX) injection  40 mg Subcutaneous Q24H   guaiFENesin  600 mg Oral BID   levothyroxine  50 mcg Oral QAC breakfast   [START ON 04/25/2023] metFORMIN  1,000 mg Oral BID WC   methocarbamol  500 mg Oral TID   oxybutynin  15 mg Oral QHS   pantoprazole  40 mg Oral Daily   QUEtiapine  300 mg Oral QHS   sertraline  100 mg Oral Daily   Continuous Infusions:  azithromycin Stopped (04/24/23 1610)   cefTRIAXone (ROCEPHIN)  IV 2 g (04/24/23 9604)     LOS: 1 day     Tresa Moore, MD [facility]   If 7PM-7AM, please contact night-coverage  04/24/2023, 12:04 PM

## 2023-04-25 DIAGNOSIS — A419 Sepsis, unspecified organism: Secondary | ICD-10-CM | POA: Diagnosis not present

## 2023-04-25 DIAGNOSIS — J189 Pneumonia, unspecified organism: Secondary | ICD-10-CM | POA: Diagnosis not present

## 2023-04-25 MED ORDER — AMOXICILLIN-POT CLAVULANATE 875-125 MG PO TABS: 1.0000 | ORAL_TABLET | Freq: Two times a day (BID) | ORAL | 0 refills | Status: AC

## 2023-04-25 MED ORDER — SODIUM CHLORIDE 0.9 % IV SOLN: INTRAVENOUS | Status: DC | PRN

## 2023-04-25 NOTE — Discharge Summary (Signed)
Physician Discharge Summary  Kathleen Carr:[id number] DOB: [birth year] DOA: 04/22/2023  PCP: Sherlene Shams, MD  Admit date: 04/22/2023 Discharge date: 04/25/2023  Admitted From: Home Disposition:  Home  Recommendations for Outpatient Follow-up:  Follow up with PCP in 1-2 weeks   Home Health:No Equipment/Devices:None   Discharge Condition:Stable  CODE STATUS:FULL  Diet recommendation: Reg  Brief/Interim Summary:  52 year old female with PMH of asthma, bipolar, type 2 diabetes mellitus, hypertension, hypothyroid who presents to the ED with acute onset fever Tmax 103 with associated dyspnea and dry cough.  Pleuritic chest pain with deep breathing.   Patient had a  recently performed hip replacement.  She was seen in orthopedic office on the day prior to presentation to the ER.  Wound look good.  CT of hip performed on arrival to the ED demonstrated a intramuscular fluid collection.  Discussed case with orthopedics Dr. Audelia Acton.  Evaluated patient at bedside.  Surgical site looks clean.  Suspected that fluid collection is a seroma.  Per orthopedics unlikely to be source of infection.    6/28:  Breakthrough fever noted.  Mental status improved 6/29: No fevers noted over interval.  Respiratory viral panel and COVID swab negative.  Unclear nature of infectious process.  Suspect recently recovered viral syndrome with possible superimposed bacterial infection.  Will treat with empiric course of antibiotics.  Follow-up outpatient PCP 1 to 2 weeks    Discharge Diagnoses:  Principal Problem:   Sepsis due to pneumonia [facility]) Active Problems:   Hypothyroidism   GERD without esophagitis   Depression   Type 2 diabetes mellitus without complications (HCC)   Acute metabolic encephalopathy   Hypoxemia  Sepsis Suspected secondary to pneumonia.  Manifested by fever tachycardia and tachypnea Mild bibasilar infiltrates and bronchial thickening noted on imaging Started on broad-spectrum  antibiotics azithromycin and Rocephin on admission Plan: COVID swab and respiratory viral panel negative.  Unclear nature of respiratory compromise/sepsis.  Suspect viral syndrome with superimposed bacterial infection.  Will treat empirically with short course of antibiotics.  Transition to Augmentin.  Complete additional 5 days for total 7-day antibiotic course.    Type 2 diabetes mellitus without complication Well-controlled.  Okay to continue metformin   Depression Zoloft and Seroquel   GERD PPI   Hypothyroidism Synthroid  Discharge Instructions  Discharge Instructions     Diet - low sodium heart healthy   Complete by: As directed    Increase activity slowly   Complete by: As directed       Allergies as of 04/25/2023       Reactions   Dust Mite Mixed Allergen Ext [mite (d. Farinae)]    Respiratory distresss   Nsaids    Acute renal failure   Other Hives   Allergy to Hickory, walnut and Birch trees and all grasses and allergic to Rabbits- patient reports anaphylactic    Sulfa Antibiotics Hives   Latex Itching   Misc. Sulfonamide Containing Compounds Hives        Medication List     TAKE these medications    acetaminophen 500 MG tablet Commonly known as: TYLENOL Take 2 tablets (1,000 mg total) by mouth every 8 (eight) hours.   albuterol 108 (90 Base) MCG/ACT inhaler Commonly known as: VENTOLIN HFA Inhale 1-2 puffs into the lungs every 6 (six) hours as needed for wheezing or shortness of breath.   ALPRAZolam 0.25 MG tablet Commonly known as: XANAX TAKE 1 TO 2 TABLETS BY MOUTH 2 TIMES DAILY AS NEEDED FOR ANXIETY (  WILL CAUSE DROWSINESS) What changed: See the new instructions.   amoxicillin-clavulanate 875-125 MG tablet Commonly known as: AUGMENTIN Take 1 tablet by mouth 2 (two) times daily for 4 days.   aspirin 81 MG chewable tablet Chew 81 mg by mouth daily.   Colace 100 MG capsule Generic drug: docusate sodium Take 100 mg by mouth daily.    enoxaparin 40 MG/0.4ML injection Commonly known as: LOVENOX Inject 0.4 mLs (40 mg total) into the skin daily for 14 days.   lansoprazole 30 MG capsule Commonly known as: PREVACID Take 1 capsule (30 mg total) by mouth daily at 12 noon.   levothyroxine 50 MCG tablet Commonly known as: SYNTHROID TAKE 1 TABLET BY MOUTH DAILY BEFORE BREAKFAST. What changed: how much to take   loratadine 10 MG tablet Commonly known as: CLARITIN Take 10 mg by mouth daily as needed.   metFORMIN 1000 MG tablet Commonly known as: GLUCOPHAGE TAKE 1 TABLET BY MOUTH TWICE A DAY WITH A MEAL What changed:  how much to take how to take this when to take this   methocarbamol 500 MG tablet Commonly known as: ROBAXIN Take 500 mg by mouth 3 (three) times daily.   ondansetron 4 MG tablet Commonly known as: ZOFRAN Take 1 tablet (4 mg total) by mouth every 6 (six) hours as needed for nausea.   oxybutynin 15 MG 24 hr tablet Commonly known as: DITROPAN XL Take 1 tablet (15 mg total) by mouth at bedtime.   oxyCODONE 5 MG immediate release tablet Commonly known as: Oxy IR/ROXICODONE Take 0.5-1 tablets (2.5-5 mg total) by mouth every 6 (six) hours as needed for severe pain or breakthrough pain.   pantoprazole 40 MG tablet Commonly known as: PROTONIX TAKE 1 TABLET BY MOUTH DAILY   QUEtiapine 300 MG tablet Commonly known as: SEROQUEL TAKE 1 TABLET BY MOUTH EVERYDAY AT BEDTIME What changed: See the new instructions.   sertraline 100 MG tablet Commonly known as: ZOLOFT TAKE 1 TABLET BY MOUTH DAILY What changed: how much to take   traMADol 50 MG tablet Commonly known as: ULTRAM Take 1 tablet (50 mg total) by mouth every 6 (six) hours as needed for moderate pain.   zolpidem 5 MG tablet Commonly known as: AMBIEN TAKE 1 TABLET BY MOUTH AT BEDTIME AS NEEDED FOR SLEEP What changed: reasons to take this        Allergies  Allergen Reactions   Dust Mite Mixed Allergen Ext [Mite (D. Farinae)]      Respiratory distresss   Nsaids     Acute renal failure   Other Hives    Allergy to Hickory, walnut and Birch trees and all grasses and allergic to Rabbits- patient reports anaphylactic    Sulfa Antibiotics Hives   Latex Itching   Misc. Sulfonamide Containing Compounds Hives    Consultations: None   Procedures/Studies: CT HIP RIGHT W CONTRAST  Result Date: 04/23/2023 CLINICAL DATA:  Burning and warmth to the right hip incision from the right hip replacement, date of surgery 03/09/2023. Suspected hip replacement infection. Evaluate for CT evidence. EXAM: CT OF THE LOWER RIGHT EXTREMITY WITH CONTRAST TECHNIQUE: Multidetector CT imaging of the lower right extremity was performed according to the standard protocol following intravenous contrast administration. RADIATION DOSE REDUCTION: This exam was performed according to the departmental dose-optimization program which includes automated exposure control, adjustment of the mA and/or kV according to patient size and/or use of iterative reconstruction technique. CONTRAST:  75mL OMNIPAQUE IOHEXOL 350 MG/ML SOLN COMPARISON:  CT pelvis  and right femur 07/31/2022. FINDINGS: Bones/Joint/Cartilage The bone mineralization appears within normal limits. Right hip total joint replacement is new since 07/31/2022. There is secondary metallic spray artifact associated but no convincing loosening. There is no dislocation. No perihardware fracture is evident and no aggressive regional bone lesion is seen suspicious for particle disease or acute osteomyelitis. The bone around the acetabular component securing screws is unremarkable. There is no substantial joint effusion, allowing for extensive spray artifact at the level of the joint. There are mild spurring changes of the right SI joint, symphysis pubis, L5-S1 facet joints. Ligaments Suboptimally assessed by CT. Muscles and Tendons No acute regional tendon abnormality is seen within the limits of CT technique. There is  normal muscle bulk for the patient's age. Within the upper reaches of the vastus lateralis muscle at the level of the subtrochanteric proximal femoral shaft, there is a hypodense faintly rim enhancing collection measuring 2.7 x 2.3 x 3 cm and 41 Hounsfield units, unclear if this is a viscous abscess, hematoma or complex seroma but its contents are above the usual density of fluid. At this same level there is a subcutaneous fluid collection on 7:85, measuring 1.8 x 1.4 cm with a Hounsfield density of 13. This collection may communicate with the intramuscular collection over a narrow channel on 7:85. There is a small amount of calcific debris within the larger intramuscular collection. No other intramuscular abnormality is seen. No other deep soft tissue pathologic process is suspected. Soft tissues There is stranding around the subcutaneous fluid collection described above, probably inflammatory etiology this far out from surgery. There are mildly enlarged right inguinal chain nodes most likely reactive. Within the right hemipelvis no mass, adenopathy or fluid collections are seen. The visualized pelvic bowel is normal caliber. Uterus is absent. There is trace ascites in the posterior pelvis, nonspecific. The visualized bladder is unremarkable. IMPRESSION: 1. 2.7 x 2.3 x 3 cm hypodense faintly rim enhancing collection in the upper vastus lateralis muscle at the level of the subtrochanteric proximal right femur, unclear if this is a viscous abscess, hematoma or complex seroma but its contents are above that of simple fluid. 2. 1.8 x 1.4 cm subcutaneous fluid collection at this same level, may communicate with the intramuscular collection over a narrow channel on 7:85. 3. Right hip total joint replacement with secondary metallic spray artifact but no convincing evidence of loosening, acute osteomyelitis or perihardware fracture. 4. Mildly enlarged right inguinal chain nodes most likely reactive. 5. Trace ascites in the  posterior pelvis, nonspecific. Electronically Signed   By: Almira Bar M.D.   On: 04/23/2023 02:45   CT Angio Chest PE W/Cm &/Or Wo Cm  Result Date: 04/23/2023 CLINICAL DATA:  Pulmonary embolism suspected. Low to intermediate probability. Negative D-dimer. Hypoxemia noted today. Right total hip arthroplasty procedure performed 03/09/2023. EXAM: CT ANGIOGRAPHY CHEST WITH CONTRAST TECHNIQUE: Multidetector CT imaging of the chest was performed using the standard protocol during bolus administration of intravenous contrast. Multiplanar CT image reconstructions and MIPs were obtained to evaluate the vascular anatomy. RADIATION DOSE REDUCTION: This exam was performed according to the departmental dose-optimization program which includes automated exposure control, adjustment of the mA and/or kV according to patient size and/or use of iterative reconstruction technique. CONTRAST:  75mL OMNIPAQUE IOHEXOL 350 MG/ML SOLN COMPARISON:  Portable chest today, PA Lat chest 02/13/2023, and CTA chest 12/25/2021. FINDINGS: Cardiovascular: The cardiac size is upper limits of normal. There is no substantial pericardial effusion. The aorta and great vessels are normal. There  are no visible coronary artery calcifications. The pulmonary veins are nondistended. The pulmonary trunk is upper limits of normal in caliber. No arterial embolic disease is seen. There are no findings consistent with acute right heart strain. Mediastinum/Nodes: There are bilateral mildly prominent hilar lymph nodes up to 1.1 cm in short axis on both sides. There are few similar sized subcarinal, left-sided prevascular space and right paratracheal nodes. No new or interval enlarged adenopathy is seen. There is no bulky or encasing adenopathy. Thyroid gland and axillary spaces are unremarkable. The thoracic esophagus, thoracic trachea and main bronchi are unremarkable. Lungs/Pleura: There are bilateral minimal layering pleural effusions. There is no  pneumothorax. There are posterior basal opacities in both lower lobes which could represent atelectasis or pneumonia, slightly greater on the right. Bronchial thickening is noted in both lower lobes. There is mild posterior atelectasis in the upper lobes. The lungs are otherwise clear.  No lung nodules are seen. Upper Abdomen: No acute abnormality. Status post cholecystectomy. Mild hepatic steatosis. Chronic elevated right hemidiaphragm. Musculoskeletal: There are degenerative changes of the thoracic spine. No acute osseous abnormality or aggressive lesions. No chest wall mass is seen. Review of the MIP images confirms the above findings. IMPRESSION: 1. Upper limit of normal pulmonary trunk caliber with no evidence of arterial emboli. 2. Borderline cardiomegaly.  No venous dilatation or edema. 3. Bilateral lower lobe posterior basal opacities which could represent atelectasis or pneumonia, with bronchial thickening in both lower lobes. 4. Mildly prominent hilar lymph nodes, but unchanged. 5. Hepatic steatosis. Electronically Signed   By: Almira Bar M.D.   On: 04/23/2023 02:17   CT Head Wo Contrast  Result Date: 04/23/2023 CLINICAL DATA:  Severe headache with mental status changes. EXAM: CT HEAD WITHOUT CONTRAST TECHNIQUE: Contiguous axial images were obtained from the base of the skull through the vertex without intravenous contrast. RADIATION DOSE REDUCTION: This exam was performed according to the departmental dose-optimization program which includes automated exposure control, adjustment of the mA and/or kV according to patient size and/or use of iterative reconstruction technique. COMPARISON:  None Available. FINDINGS: Brain: No evidence of acute infarction, hemorrhage, hydrocephalus, extra-axial collection or mass lesion/mass effect. Vascular: There are scattered calcifications in the carotid siphons. No hyperdense central vessel is seen. Skull: Negative for fractures or focal lesions. Sinuses/Orbits:  No acute finding. Other: None. IMPRESSION: No acute intracranial CT findings. Atherosclerosis. Electronically Signed   By: Almira Bar M.D.   On: 04/23/2023 00:07   DG Chest Port 1 View  Result Date: 04/22/2023 CLINICAL DATA:  Questionable sepsis. Check for infiltrates or other abnormality. Headache and neck pain also. EXAM: PORTABLE CHEST 1 VIEW COMPARISON:  PA and lateral study 02/13/2023 FINDINGS: The heart size and mediastinal contours are within normal limits. Both lungs are clear. The visualized skeletal structures are unremarkable. IMPRESSION: No evidence of acute chest disease.  Stable chest. Electronically Signed   By: Almira Bar M.D.   On: 04/22/2023 23:11      Subjective: Seen and examined on the day of discharge.  Stable no distress.  Appropriate for discharge home.  Discharge Exam: Vitals:   04/25/23 0407 04/25/23 0900  BP: 115/61 111/75  Pulse: 83 68  Resp: 18 18  Temp: 98.7 F (37.1 C) 98.3 F (36.8 C)  SpO2: 95% 92%   Vitals:   04/24/23 0823 04/24/23 1933 04/25/23 0407 04/25/23 0900  BP: (!) 91/59 123/67 115/61 111/75  Pulse: 72 62 83 68  Resp: 18 18 18 18   Temp: 99.4  F (37.4 C) 99.1 F (37.3 C) 98.7 F (37.1 C) 98.3 F (36.8 C)  TempSrc:  Oral Oral   SpO2: 95% 100% 95% 92%  Weight:      Height:        General: Pt is alert, awake, not in acute distress Cardiovascular: RRR, S1/S2 +, no rubs, no gallops Respiratory: CTA bilaterally, no wheezing, no rhonchi Abdominal: Soft, NT, ND, bowel sounds + Extremities: no edema, no cyanosis    The results of significant diagnostics from this hospitalization (including imaging, microbiology, ancillary and laboratory) are listed below for reference.     Microbiology: Recent Results (from the past 240 hour(s))  Resp panel by RT-PCR (RSV, Flu A&B, Covid) Anterior Nasal Swab     Status: None   Collection Time: 04/22/23 10:42 PM   Specimen: Anterior Nasal Swab  Result Value Ref Range Status   SARS  Coronavirus 2 by RT PCR NEGATIVE NEGATIVE Final    Comment: (NOTE) SARS-CoV-2 target nucleic acids are NOT DETECTED.  The SARS-CoV-2 RNA is generally detectable in upper respiratory specimens during the acute phase of infection. The lowest concentration of SARS-CoV-2 viral copies this assay can detect is 138 copies/mL. A negative result does not preclude SARS-Cov-2 infection and should not be used as the sole basis for treatment or other patient management decisions. A negative result may occur with  improper specimen collection/handling, submission of specimen other than nasopharyngeal swab, presence of viral mutation(s) within the areas targeted by this assay, and inadequate number of viral copies(<138 copies/mL). A negative result must be combined with clinical observations, patient history, and epidemiological information. The expected result is Negative.  Fact Sheet for Patients:  BloggerCourse.com  Fact Sheet for Healthcare Providers:  SeriousBroker.it  This test is no t yet approved or cleared by the [place] FDA and  has been authorized for detection and/or diagnosis of SARS-CoV-2 by FDA under an Emergency Use Authorization (EUA). This EUA will remain  in effect (meaning this test can be used) for the duration of the COVID-19 declaration under Section 564(b)(1) of the Act, 21 U.S.C.section 360bbb-3(b)(1), unless the authorization is terminated  or revoked sooner.       Influenza A by PCR NEGATIVE NEGATIVE Final   Influenza B by PCR NEGATIVE NEGATIVE Final    Comment: (NOTE) The Xpert Xpress SARS-CoV-2/FLU/RSV plus assay is intended as an aid in the diagnosis of influenza from Nasopharyngeal swab specimens and should not be used as a sole basis for treatment. Nasal washings and aspirates are unacceptable for Xpert Xpress SARS-CoV-2/FLU/RSV testing.  Fact Sheet for  Patients: BloggerCourse.com  Fact Sheet for Healthcare Providers: SeriousBroker.it  This test is not yet approved or cleared by the [place] FDA and has been authorized for detection and/or diagnosis of SARS-CoV-2 by FDA under an Emergency Use Authorization (EUA). This EUA will remain in effect (meaning this test can be used) for the duration of the COVID-19 declaration under Section 564(b)(1) of the Act, 21 U.S.C. section 360bbb-3(b)(1), unless the authorization is terminated or revoked.     Resp Syncytial Virus by PCR NEGATIVE NEGATIVE Final    Comment: (NOTE) Fact Sheet for Patients: BloggerCourse.com  Fact Sheet for Healthcare Providers: SeriousBroker.it  This test is not yet approved or cleared by the [place] FDA and has been authorized for detection and/or diagnosis of SARS-CoV-2 by FDA under an Emergency Use Authorization (EUA). This EUA will remain in effect (meaning this test can be used) for the duration of the COVID-19 declaration  under Section 564(b)(1) of the Act, 21 U.S.C. section 360bbb-3(b)(1), unless the authorization is terminated or revoked.  Performed at [facility], [address] [place], [place] [address]   Blood Culture (routine x 2)     Status: None (Preliminary result)   Collection Time: 04/22/23 10:42 PM   Specimen: BLOOD  Result Value Ref Range Status   Specimen Description BLOOD LEFT HAND  Final   Special Requests   Final    BOTTLES DRAWN AEROBIC AND ANAEROBIC Blood Culture results may not be optimal due to an inadequate volume of blood received in culture bottles   Culture   Final    NO GROWTH 3 DAYS Performed at [facility], [address], [place], [place] [address]    Report Status PENDING  Incomplete  Blood Culture (routine x 2)     Status: None (Preliminary result)   Collection Time: 04/22/23 10:42 PM    Specimen: BLOOD  Result Value Ref Range Status   Specimen Description BLOOD RIGHT ASSIST CONTROL  Final   Special Requests   Final    BOTTLES DRAWN AEROBIC AND ANAEROBIC Blood Culture adequate volume   Culture   Final    NO GROWTH 3 DAYS Performed at [facility], [address], [place], [place] [address]    Report Status PENDING  Incomplete  Respiratory (~20 pathogens) panel by PCR     Status: None   Collection Time: 04/24/23 11:28 AM   Specimen: Nasopharyngeal Swab; Respiratory  Result Value Ref Range Status   Adenovirus NOT DETECTED NOT DETECTED Final   Coronavirus 229E NOT DETECTED NOT DETECTED Final    Comment: (NOTE) The Coronavirus on the Respiratory Panel, DOES NOT test for the novel  Coronavirus (2019 nCoV)    Coronavirus HKU1 NOT DETECTED NOT DETECTED Final   Coronavirus NL63 NOT DETECTED NOT DETECTED Final   Coronavirus OC43 NOT DETECTED NOT DETECTED Final   Metapneumovirus NOT DETECTED NOT DETECTED Final   Rhinovirus / Enterovirus NOT DETECTED NOT DETECTED Final   Influenza A NOT DETECTED NOT DETECTED Final   Influenza B NOT DETECTED NOT DETECTED Final   Parainfluenza Virus 1 NOT DETECTED NOT DETECTED Final   Parainfluenza Virus 2 NOT DETECTED NOT DETECTED Final   Parainfluenza Virus 3 NOT DETECTED NOT DETECTED Final   Parainfluenza Virus 4 NOT DETECTED NOT DETECTED Final   Respiratory Syncytial Virus NOT DETECTED NOT DETECTED Final   Bordetella pertussis NOT DETECTED NOT DETECTED Final   Bordetella Parapertussis NOT DETECTED NOT DETECTED Final   Chlamydophila pneumoniae NOT DETECTED NOT DETECTED Final   Mycoplasma pneumoniae NOT DETECTED NOT DETECTED Final    Comment: Performed at [facility], [address], [place], [place] [address]     Labs: BNP (last 3 results) No results for input(s): "BNP" in the last 8760 hours. Basic Metabolic Panel: Recent Labs  Lab 04/22/23 2242 04/23/23 0452 04/24/23 0811  NA 133* 133* 137  K 3.9 3.5 3.9  CL  104 107 108  CO2 18* 21* 24  GLUCOSE 113* 99 112*  BUN 15 13 10   CREATININE 1.11* 1.07*  1.08* 0.79  CALCIUM 9.0 8.0* 8.8*   Liver Function Tests: Recent Labs  Lab 04/22/23 2242 04/23/23 0452  AST 22 17  ALT 17 15  ALKPHOS 74 71  BILITOT 0.5 0.6  PROT 7.8 5.9*  ALBUMIN 4.7 3.3*   No results for input(s): "LIPASE", "AMYLASE" in the last 168 hours. Recent Labs  Lab 04/22/23 2242  AMMONIA 21   CBC: Recent Labs  Lab 04/22/23 2242 04/23/23 0452 04/24/23 0811  WBC 8.4 7.7 4.2  NEUTROABS 5.3  --  2.0  HGB 12.6 10.7* 10.8*  HCT 38.3 33.1* 32.9*  MCV 93.6 96.8 94.0  PLT 335 277 234   Cardiac Enzymes: No results for input(s): "CKTOTAL", "CKMB", "CKMBINDEX", "TROPONINI" in the last 168 hours. BNP: Invalid input(s): "POCBNP" CBG: Recent Labs  Lab 04/22/23 2234  GLUCAP 117*   D-Dimer No results for input(s): "DDIMER" in the last 72 hours. Hgb A1c No results for input(s): "HGBA1C" in the last 72 hours. Lipid Profile No results for input(s): "CHOL", "HDL", "LDLCALC", "TRIG", "CHOLHDL", "LDLDIRECT" in the last 72 hours. Thyroid function studies Recent Labs    04/22/23 2242  TSH 0.600   Anemia work up No results for input(s): "VITAMINB12", "FOLATE", "FERRITIN", "TIBC", "IRON", "RETICCTPCT" in the last 72 hours. Urinalysis    Component Value Date/Time   COLORURINE STRAW (A) 04/22/2023 2304   APPEARANCEUR CLEAR (A) 04/22/2023 2304   LABSPEC 1.014 04/22/2023 2304   PHURINE 7.0 04/22/2023 2304   GLUCOSEU NEGATIVE 04/22/2023 2304   GLUCOSEU NEGATIVE 12/31/2022 1101   HGBUR SMALL (A) 04/22/2023 2304   BILIRUBINUR NEGATIVE 04/22/2023 2304   BILIRUBINUR neg 01/21/2021 0837   KETONESUR NEGATIVE 04/22/2023 2304   PROTEINUR NEGATIVE 04/22/2023 2304   UROBILINOGEN 0.2 12/31/2022 1101   NITRITE NEGATIVE 04/22/2023 2304   LEUKOCYTESUR NEGATIVE 04/22/2023 2304   Sepsis Labs Recent Labs  Lab 04/22/23 2242 04/23/23 0452 04/24/23 0811  WBC 8.4 7.7 4.2    Microbiology Recent Results (from the past 240 hour(s))  Resp panel by RT-PCR (RSV, Flu A&B, Covid) Anterior Nasal Swab     Status: None   Collection Time: 04/22/23 10:42 PM   Specimen: Anterior Nasal Swab  Result Value Ref Range Status   SARS Coronavirus 2 by RT PCR NEGATIVE NEGATIVE Final    Comment: (NOTE) SARS-CoV-2 target nucleic acids are NOT DETECTED.  The SARS-CoV-2 RNA is generally detectable in upper respiratory specimens during the acute phase of infection. The lowest concentration of SARS-CoV-2 viral copies this assay can detect is 138 copies/mL. A negative result does not preclude SARS-Cov-2 infection and should not be used as the sole basis for treatment or other patient management decisions. A negative result may occur with  improper specimen collection/handling, submission of specimen other than nasopharyngeal swab, presence of viral mutation(s) within the areas targeted by this assay, and inadequate number of viral copies(<138 copies/mL). A negative result must be combined with clinical observations, patient history, and epidemiological information. The expected result is Negative.  Fact Sheet for Patients:  BloggerCourse.com  Fact Sheet for Healthcare Providers:  SeriousBroker.it  This test is no t yet approved or cleared by the [place] FDA and  has been authorized for detection and/or diagnosis of SARS-CoV-2 by FDA under an Emergency Use Authorization (EUA). This EUA will remain  in effect (meaning this test can be used) for the duration of the COVID-19 declaration under Section 564(b)(1) of the Act, 21 U.S.C.section 360bbb-3(b)(1), unless the authorization is terminated  or revoked sooner.       Influenza A by PCR NEGATIVE NEGATIVE Final   Influenza B by PCR NEGATIVE NEGATIVE Final    Comment: (NOTE) The Xpert Xpress SARS-CoV-2/FLU/RSV plus assay is intended as an aid in the diagnosis of  influenza from Nasopharyngeal swab specimens and should not be used as a sole basis for treatment. Nasal washings and aspirates are unacceptable for Xpert  Xpress SARS-CoV-2/FLU/RSV testing.  Fact Sheet for Patients: BloggerCourse.com  Fact Sheet for Healthcare Providers: SeriousBroker.it  This test is not yet approved or cleared by the [place] FDA and has been authorized for detection and/or diagnosis of SARS-CoV-2 by FDA under an Emergency Use Authorization (EUA). This EUA will remain in effect (meaning this test can be used) for the duration of the COVID-19 declaration under Section 564(b)(1) of the Act, 21 U.S.C. section 360bbb-3(b)(1), unless the authorization is terminated or revoked.     Resp Syncytial Virus by PCR NEGATIVE NEGATIVE Final    Comment: (NOTE) Fact Sheet for Patients: BloggerCourse.com  Fact Sheet for Healthcare Providers: SeriousBroker.it  This test is not yet approved or cleared by the [place] FDA and has been authorized for detection and/or diagnosis of SARS-CoV-2 by FDA under an Emergency Use Authorization (EUA). This EUA will remain in effect (meaning this test can be used) for the duration of the COVID-19 declaration under Section 564(b)(1) of the Act, 21 U.S.C. section 360bbb-3(b)(1), unless the authorization is terminated or revoked.  Performed at [facility], [address] [place], [place] [address]   Blood Culture (routine x 2)     Status: None (Preliminary result)   Collection Time: 04/22/23 10:42 PM   Specimen: BLOOD  Result Value Ref Range Status   Specimen Description BLOOD LEFT HAND  Final   Special Requests   Final    BOTTLES DRAWN AEROBIC AND ANAEROBIC Blood Culture results may not be optimal due to an inadequate volume of blood received in culture bottles   Culture   Final    NO GROWTH 3 DAYS Performed at  [facility], [address], [place], [place] [address]    Report Status PENDING  Incomplete  Blood Culture (routine x 2)     Status: None (Preliminary result)   Collection Time: 04/22/23 10:42 PM   Specimen: BLOOD  Result Value Ref Range Status   Specimen Description BLOOD RIGHT ASSIST CONTROL  Final   Special Requests   Final    BOTTLES DRAWN AEROBIC AND ANAEROBIC Blood Culture adequate volume   Culture   Final    NO GROWTH 3 DAYS Performed at [facility], [address] [place], [place] [address]    Report Status PENDING  Incomplete  Respiratory (~20 pathogens) panel by PCR     Status: None   Collection Time: 04/24/23 11:28 AM   Specimen: Nasopharyngeal Swab; Respiratory  Result Value Ref Range Status   Adenovirus NOT DETECTED NOT DETECTED Final   Coronavirus 229E NOT DETECTED NOT DETECTED Final    Comment: (NOTE) The Coronavirus on the Respiratory Panel, DOES NOT test for the novel  Coronavirus (2019 nCoV)    Coronavirus HKU1 NOT DETECTED NOT DETECTED Final   Coronavirus NL63 NOT DETECTED NOT DETECTED Final   Coronavirus OC43 NOT DETECTED NOT DETECTED Final   Metapneumovirus NOT DETECTED NOT DETECTED Final   Rhinovirus / Enterovirus NOT DETECTED NOT DETECTED Final   Influenza A NOT DETECTED NOT DETECTED Final   Influenza B NOT DETECTED NOT DETECTED Final   Parainfluenza Virus 1 NOT DETECTED NOT DETECTED Final   Parainfluenza Virus 2 NOT DETECTED NOT DETECTED Final   Parainfluenza Virus 3 NOT DETECTED NOT DETECTED Final   Parainfluenza Virus 4 NOT DETECTED NOT DETECTED Final   Respiratory Syncytial Virus NOT DETECTED NOT DETECTED Final   Bordetella pertussis NOT DETECTED NOT DETECTED Final   Bordetella Parapertussis NOT DETECTED NOT DETECTED Final   Chlamydophila  pneumoniae NOT DETECTED NOT DETECTED Final   Mycoplasma pneumoniae NOT DETECTED NOT DETECTED Final    Comment: Performed at [facility], [address], [place], [place] [address]      Time coordinating discharge: Over 30 minutes  SIGNED:   Tresa Moore, MD  [facility] 04/25/2023, 11:19 AM Pager   If 7PM-7AM, please contact night-coverage

## 2023-04-26 LAB — CULTURE, BLOOD (ROUTINE X 2)

## 2023-04-27 ENCOUNTER — Telehealth: Payer: Self-pay

## 2023-04-27 ENCOUNTER — Other Ambulatory Visit: Payer: Self-pay | Admitting: Internal Medicine

## 2023-04-27 LAB — CULTURE, BLOOD (ROUTINE X 2)

## 2023-04-27 NOTE — Transitions of Care (Post Inpatient/ED Visit) (Signed)
04/27/2023  Name: Kathleen Carr MRN: [id number] DOB: [birth year]  Today's TOC FU Call Status: Today's TOC FU Call Status:: Successful TOC FU Call Competed TOC FU Call Complete Date: 04/27/23  Transition Care Management Follow-up Telephone Call Date of Discharge: 04/25/23 Discharge Facility: [facility]) Type of Discharge: Inpatient Admission Primary Inpatient Discharge Diagnosis:: altered mental status How have you been since you were released from the hospital?: Better Any questions or concerns?: No  Items Reviewed: Did you receive and understand the discharge instructions provided?: Yes Medications obtained,verified, and reconciled?: Yes (Medications Reviewed) Any new allergies since your discharge?: No Dietary orders reviewed?: NA Do you have support at home?: Yes People in Home: spouse  Medications Reviewed Today: Medications Reviewed Today     Reviewed by Karena Addison, LPN (Licensed Practical Nurse) on 04/27/23 at 1053  Med List Status: <None>   Medication Order Taking? Sig Documenting Provider Last Dose Status Informant  acetaminophen (TYLENOL) 500 MG tablet [id number] No Take 2 tablets (1,000 mg total) by mouth every 8 (eight) hours. Evon Slack, PA-C 04/22/2023 Active Spouse/Significant Other  albuterol (VENTOLIN HFA) 108 (90 Base) MCG/ACT inhaler [id number] No Inhale 1-2 puffs into the lungs every 6 (six) hours as needed for wheezing or shortness of breath. [provider] unknown prn Active Spouse/Significant Other  ALPRAZolam (XANAX) 0.25 MG tablet [id number] No TAKE 1 TO 2 TABLETS BY MOUTH 2 TIMES DAILY AS NEEDED FOR ANXIETY (WILL CAUSE DROWSINESS)  Patient taking differently: Take 0.25-0.5 mg by mouth 2 (two) times daily as needed for anxiety.   Sherlene Shams, MD 04/22/2023 Active Spouse/Significant Other  amoxicillin-clavulanate (AUGMENTIN) 875-125 MG tablet [id number]  Take 1 tablet by mouth 2 (two) times daily for 4 days.  Tresa Moore, MD  Active   aspirin 81 MG chewable tablet [id number] No Chew 81 mg by mouth daily. [provider] 04/22/2023 Active Spouse/Significant Other  docusate sodium (COLACE) 100 MG capsule [id number] No Take 100 mg by mouth daily. [provider] 04/22/2023 Active Spouse/Significant Other  enoxaparin (LOVENOX) 40 MG/0.4ML injection [id number] No Inject 0.4 mLs (40 mg total) into the skin daily for 14 days. Evon Slack, PA-C Taking Expired 03/24/23 2359   lansoprazole (PREVACID) 30 MG capsule [id number] No Take 1 capsule (30 mg total) by mouth daily at 12 noon. Sherlene Shams, MD 04/22/2023 Active Spouse/Significant Other  levothyroxine (SYNTHROID) 50 MCG tablet [id number] No TAKE 1 TABLET BY MOUTH DAILY BEFORE BREAKFAST.  Patient taking differently: Take 50 mcg by mouth daily before breakfast.   Sherlene Shams, MD 04/22/2023 Active Spouse/Significant Other  loratadine (CLARITIN) 10 MG tablet [id number] No Take 10 mg by mouth daily as needed. [provider] 04/22/2023 Active Spouse/Significant Other  metFORMIN (GLUCOPHAGE) 1000 MG tablet [id number] No TAKE 1 TABLET BY MOUTH TWICE A DAY WITH A MEAL  Patient taking differently: Take 1,000 mg by mouth 2 (two) times daily with a meal. TAKE 1 TABLET BY MOUTH TWICE A DAY WITH A MEAL   Sherlene Shams, MD 04/22/2023 Active Spouse/Significant Other  methocarbamol (ROBAXIN) 500 MG tablet [id number] No Take 500 mg by mouth 3 (three) times daily. [provider] 04/22/2023 Active Spouse/Significant Other  ondansetron (ZOFRAN) 4 MG tablet [id number] No Take 1 tablet (4 mg total) by mouth every 6 (six) hours as needed for nausea. Sherlene Shams, MD unknown prn Active Spouse/Significant Other  oxybutynin (DITROPAN XL) 15 MG 24 hr tablet [id number] No Take 1 tablet (15 mg total) by  mouth at bedtime. Sherlene Shams, MD 04/22/2023 Active Spouse/Significant Other  oxyCODONE (OXY IR/ROXICODONE) 5 MG immediate release tablet  [id number] No Take 0.5-1 tablets (2.5-5 mg total) by mouth every 6 (six) hours as needed for severe pain or breakthrough pain. Evon Slack, PA-C 04/22/2023 Active Spouse/Significant Other  pantoprazole (PROTONIX) 40 MG tablet [id number] No TAKE 1 TABLET BY MOUTH DAILY Sherlene Shams, MD 04/22/2023 Active Spouse/Significant Other  QUEtiapine (SEROQUEL) 300 MG tablet [id number] No TAKE 1 TABLET BY MOUTH EVERYDAY AT BEDTIME  Patient taking differently: Take 300 mg by mouth at bedtime.   Sherlene Shams, MD 04/22/2023 Active Spouse/Significant Other  sertraline (ZOLOFT) 100 MG tablet [id number] No TAKE 1 TABLET BY MOUTH DAILY  Patient taking differently: Take 100 mg by mouth daily.   Sherlene Shams, MD 04/22/2023 Active Spouse/Significant Other  Discontinued 07/28/22 1620 (Change in therapy)   Discontinued 01/22/21 1321 (Not covered by the pt's insurance)   traMADol (ULTRAM) 50 MG tablet [id number] No Take 1 tablet (50 mg total) by mouth every 6 (six) hours as needed for moderate pain. Evon Slack, PA-C 04/22/2023 Active Spouse/Significant Other  zolpidem (AMBIEN) 5 MG tablet [id number] No TAKE 1 TABLET BY MOUTH AT BEDTIME AS NEEDED FOR SLEEP  Patient taking differently: Take 5 mg by mouth at bedtime as needed for sleep. for sleep   Sherlene Shams, MD 04/22/2023 Active Spouse/Significant Other            Home Care and Equipment/Supplies: Were Home Health Services Ordered?: NA Has Agency set up a time to come to your home?: No Any new equipment or medical supplies ordered?: NA  Functional Questionnaire: Do you need assistance with bathing/showering or dressing?: No Do you need assistance with meal preparation?: No Do you need assistance with eating?: No Do you have difficulty maintaining continence: No Do you need assistance with getting out of bed/getting out of a chair/moving?: No Do you have difficulty managing or taking your medications?: No  Follow up appointments reviewed: PCP  Follow-up appointment confirmed?: Yes Date of PCP follow-up appointment?: 05/05/23 Follow-up Provider: [facility] Follow-up appointment confirmed?: NA Do you need transportation to your follow-up appointment?: No Do you understand care options if your condition(s) worsen?: Yes-patient verbalized understanding    SIGNATURE Karena Addison, LPN [facility] Nurse Health Advisor Direct Dial [phone]

## 2023-05-05 ENCOUNTER — Ambulatory Visit (INDEPENDENT_AMBULATORY_CARE_PROVIDER_SITE_OTHER): Payer: 59 | Admitting: Internal Medicine

## 2023-05-05 ENCOUNTER — Encounter: Payer: Self-pay | Admitting: Internal Medicine

## 2023-05-05 VITALS — BP 112/60 | HR 83 | Temp 97.7°F | Ht 64.0 in | Wt 162.4 lb

## 2023-05-05 DIAGNOSIS — F319 Bipolar disorder, unspecified: Secondary | ICD-10-CM

## 2023-05-05 DIAGNOSIS — D500 Iron deficiency anemia secondary to blood loss (chronic): Secondary | ICD-10-CM

## 2023-05-05 DIAGNOSIS — D72829 Elevated white blood cell count, unspecified: Secondary | ICD-10-CM | POA: Diagnosis not present

## 2023-05-05 DIAGNOSIS — Z09 Encounter for follow-up examination after completed treatment for conditions other than malignant neoplasm: Secondary | ICD-10-CM

## 2023-05-05 DIAGNOSIS — J189 Pneumonia, unspecified organism: Secondary | ICD-10-CM | POA: Diagnosis not present

## 2023-05-05 DIAGNOSIS — G4700 Insomnia, unspecified: Secondary | ICD-10-CM | POA: Diagnosis not present

## 2023-05-05 DIAGNOSIS — E871 Hypo-osmolality and hyponatremia: Secondary | ICD-10-CM

## 2023-05-05 MED ORDER — SERTRALINE HCL 100 MG PO TABS
ORAL_TABLET | Freq: Every day | ORAL | 1 refills | Status: DC
Start: 2023-05-05 — End: 2023-08-25

## 2023-05-05 MED ORDER — PANTOPRAZOLE SODIUM 40 MG PO TBEC: 40.0000 mg | DELAYED_RELEASE_TABLET | Freq: Every day | ORAL | 3 refills | Status: DC

## 2023-05-05 MED ORDER — LEVOTHYROXINE SODIUM 50 MCG PO TABS: ORAL_TABLET | Freq: Every day | ORAL | 1 refills | Status: DC

## 2023-05-05 MED ORDER — QUETIAPINE FUMARATE 300 MG PO TABS: 300.0000 mg | ORAL_TABLET | Freq: Every day | ORAL | 1 refills | Status: DC

## 2023-05-05 MED ORDER — OXYBUTYNIN CHLORIDE ER 15 MG PO TB24: 15.0000 mg | ORAL_TABLET | Freq: Every day | ORAL | 1 refills | Status: DC

## 2023-05-05 NOTE — Progress Notes (Signed)
Subjective:  Patient ID: Kathleen Carr, female    DOB: [birth year]  Age: 52 y.o. MRN: [id number]  CC: The primary encounter diagnosis was Iron deficiency anemia due to chronic blood loss. Diagnoses of Bipolar 1 disorder (HCC), Insomnia, unspecified type, Low sodium levels, Hospital discharge follow-up, Community acquired bilateral lower lobe pneumonia, and Leukocytosis, unspecified type were also pertinent to this visit.   HPI Kathleen Carr presents for  Chief Complaint  Patient presents with   Hospitalization Follow-up    Kathleen Carr was admitted June 26  to [facility] with AMS/metabolic encephalopathy and hypoxemia.  Her symptoms were attributed to sepsis secondary to  community acquired  pneumonia . She states that she was in good health on the day prior to admission, .and had seen her orthopedist for follow up on recent hip replacement,   bu developed headache and fever/ to 103  with chills and delirum the following day and was taken by her wife to [facility].   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 Bilateral LLL atx vs infiltrates.  Procalcitonin 0.15  respiratory panel cultures and COVID tests all  negative  given azith/rocephin and discharged on June 29 with augmentin to complete 7 days course of ab    Developed an adverse reaction on the night of admission after receiveing zofran and morphine,  blotchy rash on IV arm. The surgical hip became red and hot.  CT hip was done orthopedics evaluated her (her orthopedist  who had seen her the day before) and diagnosed with seroma, non infectious   Reviewed medications.  She is no longer taking iron. She was not prescribed prednisone or a probiotic at discharge,  only 4 days of Augmentin/    Outpatient Medications Prior to Visit  Medication Sig Dispense Refill   acetaminophen (TYLENOL) 500 MG tablet Take 2 tablets (1,000 mg total) by mouth every 8 (eight) hours. 30 tablet 0   albuterol (VENTOLIN HFA) 108  (90 Base) MCG/ACT inhaler Inhale 1-2 puffs into the lungs every 6 (six) hours as needed for wheezing or shortness of breath.     ALPRAZolam (XANAX) 0.25 MG tablet TAKE 1 TO 2 TABLETS BY MOUTH 2 TIMES DAILY AS NEEDED FOR ANXIETY (WILL CAUSE DROWSINESS) (Patient taking differently: Take 0.25-0.5 mg by mouth 2 (two) times daily as needed for anxiety.) 30 tablet 1   aspirin 81 MG chewable tablet Chew 81 mg by mouth daily.     docusate sodium (COLACE) 100 MG capsule Take 100 mg by mouth daily.     loratadine (CLARITIN) 10 MG tablet Take 10 mg by mouth daily as needed.     metFORMIN (GLUCOPHAGE) 1000 MG tablet TAKE 1 TABLET BY MOUTH TWICE A DAY WITH FOOD 60 tablet 1   methocarbamol (ROBAXIN) 500 MG tablet Take 500 mg by mouth 3 (three) times daily.     oxyCODONE (OXY IR/ROXICODONE) 5 MG immediate release tablet Take 0.5-1 tablets (2.5-5 mg total) by mouth every 6 (six) hours as needed for severe pain or breakthrough pain. 20 tablet 0   zolpidem (AMBIEN) 5 MG tablet TAKE 1 TABLET BY MOUTH AT BEDTIME AS NEEDED FOR SLEEP (Patient taking differently: Take 5 mg by mouth at bedtime as needed for sleep. for sleep) 30 tablet 5   levothyroxine (SYNTHROID) 50 MCG tablet TAKE 1 TABLET BY MOUTH DAILY BEFORE BREAKFAST. (Patient taking differently: Take 50 mcg by mouth daily before breakfast.) 90 tablet 1  oxybutynin (DITROPAN XL) 15 MG 24 hr tablet Take 1 tablet (15 mg total) by mouth at bedtime. 30 tablet 2   pantoprazole (PROTONIX) 40 MG tablet TAKE 1 TABLET BY MOUTH DAILY 30 tablet 2   QUEtiapine (SEROQUEL) 300 MG tablet TAKE 1 TABLET BY MOUTH EVERYDAY AT BEDTIME (Patient taking differently: Take 300 mg by mouth at bedtime.) 90 tablet 1   sertraline (ZOLOFT) 100 MG tablet TAKE 1 TABLET BY MOUTH DAILY (Patient taking differently: Take 100 mg by mouth daily.) 90 tablet 1   ondansetron (ZOFRAN) 4 MG tablet Take 1 tablet (4 mg total) by mouth every 6 (six) hours as needed for nausea. (Patient not taking: Reported on  05/05/2023) 20 tablet 0   traMADol (ULTRAM) 50 MG tablet Take 1 tablet (50 mg total) by mouth every 6 (six) hours as needed for moderate pain. (Patient not taking: Reported on 05/05/2023) 30 tablet 0   enoxaparin (LOVENOX) 40 MG/0.4ML injection Inject 0.4 mLs (40 mg total) into the skin daily for 14 days. 5.6 mL 0   lansoprazole (PREVACID) 30 MG capsule Take 1 capsule (30 mg total) by mouth daily at 12 noon. (Patient not taking: Reported on 05/05/2023) 90 capsule 0   No facility-administered medications prior to visit.    Review of Systems;  Patient denies headache, fevers, malaise, unintentional weight loss, skin rash, eye pain, sinus congestion and sinus pain, sore throat, dysphagia,  hemoptysis , cough, dyspnea, wheezing, chest pain, palpitations, orthopnea, edema, abdominal pain, nausea, melena, diarrhea, constipation, flank pain, dysuria, hematuria, urinary  Frequency, nocturia, numbness, tingling, seizures,  Focal weakness, Loss of consciousness,  Tremor, insomnia, depression, anxiety, and suicidal ideation.      Objective:  BP 112/60   Pulse 83   Temp 97.7 F (36.5 C) (Oral)   Ht 5\' 4"  (1.626 m)   Wt 162 lb 6.4 oz (73.7 kg)   SpO2 95%   BMI 27.88 kg/m   BP Readings from Last 3 Encounters:  05/05/23 112/60  04/25/23 111/75  03/17/23 116/72    Wt Readings from Last 3 Encounters:  05/05/23 162 lb 6.4 oz (73.7 kg)  04/24/23 180 lb 1.9 oz (81.7 kg)  03/17/23 165 lb (74.8 kg)    Physical Exam Vitals reviewed.  Constitutional:      General: She is not in acute distress.    Appearance: Normal appearance. She is normal weight. She is not ill-appearing, toxic-appearing or diaphoretic.  HENT:     Head: Normocephalic.  Eyes:     General: No scleral icterus.       Right eye: No discharge.        Left eye: No discharge.     Conjunctiva/sclera: Conjunctivae normal.  Cardiovascular:     Rate and Rhythm: Normal rate and regular rhythm.     Heart sounds: Normal heart sounds.   Pulmonary:     Effort: Pulmonary effort is normal. No respiratory distress.     Breath sounds: Normal breath sounds.  Musculoskeletal:        General: Normal range of motion.  Skin:    General: Skin is warm and dry.  Neurological:     General: No focal deficit present.     Mental Status: She is alert and oriented to person, place, and time. Mental status is at baseline.  Psychiatric:        Mood and Affect: Mood normal.        Behavior: Behavior normal.        Thought Content: Thought  content normal.        Judgment: Judgment normal.    Lab Results  Component Value Date   HGBA1C 6.4 12/29/2022   HGBA1C 6.7 (A) 04/02/2022   HGBA1C 7.2 (H) 01/02/2022    Lab Results  Component Value Date   CREATININE 1.03 05/05/2023   CREATININE 0.79 04/24/2023   CREATININE 1.08 (H) 04/23/2023   CREATININE 1.07 (H) 04/23/2023    Lab Results  Component Value Date   WBC 13.9 (H) 05/05/2023   HGB 12.4 05/05/2023   HCT 37.4 05/05/2023   PLT 525.0 (H) 05/05/2023   GLUCOSE 76 05/05/2023   CHOL 251 (H) 12/29/2022   TRIG 283.0 (H) 12/29/2022   HDL 46.90 12/29/2022   LDLDIRECT 152.0 12/29/2022   LDLCALC 106 (H) 08/02/2020   ALT 14 05/05/2023   AST 17 05/05/2023   NA 138 05/05/2023   K 4.7 05/05/2023   CL 102 05/05/2023   CREATININE 1.03 05/05/2023   BUN 16 05/05/2023   CO2 25 05/05/2023   TSH 0.600 04/22/2023   INR 0.9 04/23/2023   HGBA1C 6.4 12/29/2022   MICROALBUR 1.2 12/31/2022    CT HIP RIGHT W CONTRAST  Result Date: 04/23/2023 CLINICAL DATA:  Burning and warmth to the right hip incision from the right hip replacement, date of surgery 03/09/2023. Suspected hip replacement infection. Evaluate for CT evidence. EXAM: CT OF THE LOWER RIGHT EXTREMITY WITH CONTRAST TECHNIQUE: Multidetector CT imaging of the lower right extremity was performed according to the standard protocol following intravenous contrast administration. RADIATION DOSE REDUCTION: This exam was performed according to  the departmental dose-optimization program which includes automated exposure control, adjustment of the mA and/or kV according to patient size and/or use of iterative reconstruction technique. CONTRAST:  75mL OMNIPAQUE IOHEXOL 350 MG/ML SOLN COMPARISON:  CT pelvis and right femur 07/31/2022. FINDINGS: Bones/Joint/Cartilage The bone mineralization appears within normal limits. Right hip total joint replacement is new since 07/31/2022. There is secondary metallic spray artifact associated but no convincing loosening. There is no dislocation. No perihardware fracture is evident and no aggressive regional bone lesion is seen suspicious for particle disease or acute osteomyelitis. The bone around the acetabular component securing screws is unremarkable. There is no substantial joint effusion, allowing for extensive spray artifact at the level of the joint. There are mild spurring changes of the right SI joint, symphysis pubis, L5-S1 facet joints. Ligaments Suboptimally assessed by CT. Muscles and Tendons No acute regional tendon abnormality is seen within the limits of CT technique. There is normal muscle bulk for the patient's age. Within the upper reaches of the vastus lateralis muscle at the level of the subtrochanteric proximal femoral shaft, there is a hypodense faintly rim enhancing collection measuring 2.7 x 2.3 x 3 cm and 41 Hounsfield units, unclear if this is a viscous abscess, hematoma or complex seroma but its contents are above the usual density of fluid. At this same level there is a subcutaneous fluid collection on 7:85, measuring 1.8 x 1.4 cm with a Hounsfield density of 13. This collection may communicate with the intramuscular collection over a narrow channel on 7:85. There is a small amount of calcific debris within the larger intramuscular collection. No other intramuscular abnormality is seen. No other deep soft tissue pathologic process is suspected. Soft tissues There is stranding around the  subcutaneous fluid collection described above, probably inflammatory etiology this far out from surgery. There are mildly enlarged right inguinal chain nodes most likely reactive. Within the right hemipelvis no mass, adenopathy  or fluid collections are seen. The visualized pelvic bowel is normal caliber. Uterus is absent. There is trace ascites in the posterior pelvis, nonspecific. The visualized bladder is unremarkable. IMPRESSION: 1. 2.7 x 2.3 x 3 cm hypodense faintly rim enhancing collection in the upper vastus lateralis muscle at the level of the subtrochanteric proximal right femur, unclear if this is a viscous abscess, hematoma or complex seroma but its contents are above that of simple fluid. 2. 1.8 x 1.4 cm subcutaneous fluid collection at this same level, may communicate with the intramuscular collection over a narrow channel on 7:85. 3. Right hip total joint replacement with secondary metallic spray artifact but no convincing evidence of loosening, acute osteomyelitis or perihardware fracture. 4. Mildly enlarged right inguinal chain nodes most likely reactive. 5. Trace ascites in the posterior pelvis, nonspecific. Electronically Signed   By: Almira Bar M.D.   On: 04/23/2023 02:45   CT Angio Chest PE W/Cm &/Or Wo Cm  Result Date: 04/23/2023 CLINICAL DATA:  Pulmonary embolism suspected. Low to intermediate probability. Negative D-dimer. Hypoxemia noted today. Right total hip arthroplasty procedure performed 03/09/2023. EXAM: CT ANGIOGRAPHY CHEST WITH CONTRAST TECHNIQUE: Multidetector CT imaging of the chest was performed using the standard protocol during bolus administration of intravenous contrast. Multiplanar CT image reconstructions and MIPs were obtained to evaluate the vascular anatomy. RADIATION DOSE REDUCTION: This exam was performed according to the departmental dose-optimization program which includes automated exposure control, adjustment of the mA and/or kV according to patient size  and/or use of iterative reconstruction technique. CONTRAST:  75mL OMNIPAQUE IOHEXOL 350 MG/ML SOLN COMPARISON:  Portable chest today, PA Lat chest 02/13/2023, and CTA chest 12/25/2021. FINDINGS: Cardiovascular: The cardiac size is upper limits of normal. There is no substantial pericardial effusion. The aorta and great vessels are normal. There are no visible coronary artery calcifications. The pulmonary veins are nondistended. The pulmonary trunk is upper limits of normal in caliber. No arterial embolic disease is seen. There are no findings consistent with acute right heart strain. Mediastinum/Nodes: There are bilateral mildly prominent hilar lymph nodes up to 1.1 cm in short axis on both sides. There are few similar sized subcarinal, left-sided prevascular space and right paratracheal nodes. No new or interval enlarged adenopathy is seen. There is no bulky or encasing adenopathy. Thyroid gland and axillary spaces are unremarkable. The thoracic esophagus, thoracic trachea and main bronchi are unremarkable. Lungs/Pleura: There are bilateral minimal layering pleural effusions. There is no pneumothorax. There are posterior basal opacities in both lower lobes which could represent atelectasis or pneumonia, slightly greater on the right. Bronchial thickening is noted in both lower lobes. There is mild posterior atelectasis in the upper lobes. The lungs are otherwise clear.  No lung nodules are seen. Upper Abdomen: No acute abnormality. Status post cholecystectomy. Mild hepatic steatosis. Chronic elevated right hemidiaphragm. Musculoskeletal: There are degenerative changes of the thoracic spine. No acute osseous abnormality or aggressive lesions. No chest wall mass is seen. Review of the MIP images confirms the above findings. IMPRESSION: 1. Upper limit of normal pulmonary trunk caliber with no evidence of arterial emboli. 2. Borderline cardiomegaly.  No venous dilatation or edema. 3. Bilateral lower lobe posterior  basal opacities which could represent atelectasis or pneumonia, with bronchial thickening in both lower lobes. 4. Mildly prominent hilar lymph nodes, but unchanged. 5. Hepatic steatosis. Electronically Signed   By: Almira Bar M.D.   On: 04/23/2023 02:17   CT Head Wo Contrast  Result Date: 04/23/2023 CLINICAL DATA:  Severe  headache with mental status changes. EXAM: CT HEAD WITHOUT CONTRAST TECHNIQUE: Contiguous axial images were obtained from the base of the skull through the vertex without intravenous contrast. RADIATION DOSE REDUCTION: This exam was performed according to the departmental dose-optimization program which includes automated exposure control, adjustment of the mA and/or kV according to patient size and/or use of iterative reconstruction technique. COMPARISON:  None Available. FINDINGS: Brain: No evidence of acute infarction, hemorrhage, hydrocephalus, extra-axial collection or mass lesion/mass effect. Vascular: There are scattered calcifications in the carotid siphons. No hyperdense central vessel is seen. Skull: Negative for fractures or focal lesions. Sinuses/Orbits: No acute finding. Other: None. IMPRESSION: No acute intracranial CT findings. Atherosclerosis. Electronically Signed   By: Almira Bar M.D.   On: 04/23/2023 00:07   DG Chest Port 1 View  Result Date: 04/22/2023 CLINICAL DATA:  Questionable sepsis. Check for infiltrates or other abnormality. Headache and neck pain also. EXAM: PORTABLE CHEST 1 VIEW COMPARISON:  PA and lateral study 02/13/2023 FINDINGS: The heart size and mediastinal contours are within normal limits. Both lungs are clear. The visualized skeletal structures are unremarkable. IMPRESSION: No evidence of acute chest disease.  Stable chest. Electronically Signed   By: Almira Bar M.D.   On: 04/22/2023 23:11    Assessment & Plan:  .Iron deficiency anemia due to chronic blood loss -     CBC with Differential/Platelet -     IBC + Ferritin  Bipolar 1  disorder (HCC) -     QUEtiapine Fumarate; Take 1 tablet (300 mg total) by mouth at bedtime. TAKE 1 TABLET BY MOUTH EVERYDAY AT BEDTIME Strength: 300 mg  Dispense: 90 tablet; Refill: 1 -     Sertraline HCl; TAKE 1 TABLET BY MOUTH DAILY  Dispense: 90 tablet; Refill: 1  Insomnia, unspecified type -     QUEtiapine Fumarate; Take 1 tablet (300 mg total) by mouth at bedtime. TAKE 1 TABLET BY MOUTH EVERYDAY AT BEDTIME Strength: 300 mg  Dispense: 90 tablet; Refill: 1 -     Sertraline HCl; TAKE 1 TABLET BY MOUTH DAILY  Dispense: 90 tablet; Refill: 1  Low sodium levels -     Comprehensive metabolic panel  Hospital discharge follow-up Assessment & Plan: I have reconciled her medications post discharge.  No changes were made.  She completed 4 days of augmentin .   I have reviewed the records from the hospital admission in detail with patient today including all labs,  imaging studies and notes from consultants. Patient is improved and stable post discharge and has no new issues or questions about discharge plans at the visit today for hospital follow up   Community acquired bilateral lower lobe pneumonia Assessment & Plan: All cultures were negative.  Improved clinically with empiric use of antibiotics.  Repeat film in 6 weeks   Orders: -     DG Chest 2 View; Future  Leukocytosis, unspecified type Assessment & Plan: Rechecking CBC today,  referring to hematology,  to rule out CLL  Lab Results  Component Value Date   WBC 13.9 (H) 05/05/2023   HGB 12.4 05/05/2023   HCT 37.4 05/05/2023   MCV 93.0 05/05/2023   PLT 525.0 (H) 05/05/2023     Orders: -     Ambulatory referral to Hematology / Oncology  Other orders -     Levothyroxine Sodium; TAKE 1 TABLET BY MOUTH DAILY BEFORE BREAKFAST.  Dispense: 90 tablet; Refill: 1 -     oxyBUTYnin Chloride ER; Take 1 tablet (15 mg total)  by mouth at bedtime.  Dispense: 90 tablet; Refill: 1 -     Pantoprazole Sodium; Take 1 tablet (40 mg total) by mouth  daily.  Dispense: 90 tablet; Refill: 3     I provided 30 minutes of face-to-face time during this encounter reviewing patient's last visit with me, patient's  most recent visit with cardiology,  nephrology,  and neurology,  recent surgical and non surgical procedures, previous  labs and imaging studies, counseling on currently addressed issues,  and post visit ordering to diagnostics and therapeutics .   Follow-up: No follow-ups on file.   Kathleen Shams, MD

## 2023-05-05 NOTE — Assessment & Plan Note (Signed)
I have reconciled her medications post discharge.  No changes were made.  She completed 4 days of augmentin .   I have reviewed the records from the hospital admission in detail with patient today including all labs,  imaging studies and notes from consultants. Patient is improved and stable post discharge and has no new issues or questions about discharge plans at the visit today for hospital follow up

## 2023-05-05 NOTE — Assessment & Plan Note (Signed)
All cultures were negative.  Improved clinically with empiric use of antibiotics.  Repeat film in 6 weeks

## 2023-05-05 NOTE — Assessment & Plan Note (Signed)
Rechecking CBC today,  referring to hematology,  to rule out CLL  Lab Results  Component Value Date   WBC 13.9 (H) 05/05/2023   HGB 12.4 05/05/2023   HCT 37.4 05/05/2023   MCV 93.0 05/05/2023   PLT 525.0 (H) 05/05/2023

## 2023-05-06 LAB — COMPREHENSIVE METABOLIC PANEL
ALT: 14 U/L (ref 0–35)
AST: 17 U/L (ref 0–37)
Albumin: 4.6 g/dL (ref 3.5–5.2)
Alkaline Phosphatase: 72 U/L (ref 39–117)
BUN: 16 mg/dL (ref 6–23)
CO2: 25 mEq/L (ref 19–32)
Calcium: 10.3 mg/dL (ref 8.4–10.5)
Chloride: 102 mEq/L (ref 96–112)
Creatinine, Ser: 1.03 mg/dL (ref 0.40–1.20)
GFR: 62.91 mL/min (ref >60.00)
Glucose, Bld: 76 mg/dL (ref 70–99)
Potassium: 4.7 mEq/L (ref 3.5–5.1)
Sodium: 138 mEq/L (ref 135–145)
Total Bilirubin: 0.3 mg/dL (ref 0.2–1.2)
Total Protein: 7.5 g/dL (ref 6.0–8.3)

## 2023-05-06 LAB — IBC + FERRITIN
Ferritin: 75.4 ng/mL (ref 10.0–291.0)
Iron: 88 ug/dL (ref 42–145)
Saturation Ratios: 24.4 % (ref 20.0–50.0)
TIBC: 361.2 ug/dL (ref 250.0–450.0)
Transferrin: 258 mg/dL (ref 212.0–360.0)

## 2023-05-06 LAB — CBC WITH DIFFERENTIAL/PLATELET
Basophils Absolute: 0.2 10*3/uL — ABNORMAL HIGH (ref 0.0–0.1)
Basophils Relative: 1.1 % (ref 0.0–3.0)
Eosinophils Absolute: 0.3 10*3/uL (ref 0.0–0.7)
Eosinophils Relative: 1.8 % (ref 0.0–5.0)
HCT: 37.4 % (ref 36.0–46.0)
Hemoglobin: 12.4 g/dL (ref 12.0–15.0)
Lymphocytes Relative: 39 % (ref 12.0–46.0)
Lymphs Abs: 5.4 10*3/uL — ABNORMAL HIGH (ref 0.7–4.0)
MCHC: 33.3 g/dL (ref 30.0–36.0)
MCV: 93 fl (ref 78.0–100.0)
Monocytes Absolute: 0.7 10*3/uL (ref 0.1–1.0)
Monocytes Relative: 5 % (ref 3.0–12.0)
Neutro Abs: 7.4 10*3/uL (ref 1.4–7.7)
Neutrophils Relative %: 53.1 % (ref 43.0–77.0)
Platelets: 525 10*3/uL — ABNORMAL HIGH (ref 150.0–400.0)
RBC: 4.02 Mil/uL (ref 3.87–5.11)
RDW: 13.8 % (ref 11.5–15.5)
WBC: 13.9 10*3/uL — ABNORMAL HIGH (ref 4.0–10.5)

## 2023-05-06 NOTE — Addendum Note (Signed)
Addended by: Sherlene Shams on: 05/06/2023 12:24 PM   Modules accepted: Orders

## 2023-05-11 ENCOUNTER — Inpatient Hospital Stay: Payer: 59

## 2023-05-11 ENCOUNTER — Inpatient Hospital Stay: Payer: 59 | Attending: Oncology | Admitting: Oncology

## 2023-05-11 ENCOUNTER — Encounter: Payer: Self-pay | Admitting: Oncology

## 2023-05-11 VITALS — BP 112/79 | HR 72 | Temp 96.7°F | Resp 18 | Ht 64.0 in | Wt 162.9 lb

## 2023-05-11 DIAGNOSIS — D72820 Lymphocytosis (symptomatic): Secondary | ICD-10-CM | POA: Insufficient documentation

## 2023-05-11 DIAGNOSIS — D75839 Thrombocytosis, unspecified: Secondary | ICD-10-CM | POA: Diagnosis not present

## 2023-05-11 DIAGNOSIS — Z801 Family history of malignant neoplasm of trachea, bronchus and lung: Secondary | ICD-10-CM | POA: Diagnosis not present

## 2023-05-11 LAB — CBC WITH DIFFERENTIAL/PLATELET
Abs Immature Granulocytes: 0.05 10*3/uL (ref 0.00–0.07)
Basophils Absolute: 0.1 10*3/uL (ref 0.0–0.1)
Basophils Relative: 1 %
Eosinophils Absolute: 0.2 10*3/uL (ref 0.0–0.5)
Eosinophils Relative: 2 %
HCT: 37.9 % (ref 36.0–46.0)
Hemoglobin: 12.7 g/dL (ref 12.0–15.0)
Immature Granulocytes: 1 %
Lymphocytes Relative: 47 %
Lymphs Abs: 4.6 10*3/uL — ABNORMAL HIGH (ref 0.7–4.0)
MCH: 30.8 pg (ref 26.0–34.0)
MCHC: 33.5 g/dL (ref 30.0–36.0)
MCV: 92 fL (ref 80.0–100.0)
Monocytes Absolute: 0.4 10*3/uL (ref 0.1–1.0)
Monocytes Relative: 4 %
Neutro Abs: 4.4 10*3/uL (ref 1.7–7.7)
Neutrophils Relative %: 45 %
Platelets: 441 10*3/uL — ABNORMAL HIGH (ref 150–400)
RBC: 4.12 MIL/uL (ref 3.87–5.11)
RDW: 12.9 % (ref 11.5–15.5)
WBC: 9.6 10*3/uL (ref 4.0–10.5)
nRBC: 0 % (ref 0.0–0.2)

## 2023-05-11 LAB — VITAMIN B12: Vitamin B-12: 194 pg/mL (ref 180–914)

## 2023-05-11 NOTE — Progress Notes (Signed)
Hematology/Oncology Consult note [facility], [place] [phone] [phone]  Patient Care Team: Sherlene Shams, MD as PCP - General (Internal Medicine) Salena Saner, MD as Consulting Physician (Pulmonary Disease) Susanne Borders, PA as Referring Physician (Family Medicine)   Name of the patient: Kathleen Carr  [id number]  November 28, 1970    Reason for referral-leukocytosis and thrombocytosis   Referring physician-Dr. Darrick Huntsman  Date of visit: 05/11/23   History of presenting illness-patient is a 52 year old female who was last seen by me in 2022 and was referred to me for lymphocytosis back then.  She had a peripheral flow cytometry which showed multiple lymphocyte subsets that were elevated suggestive of a reactive process.  No clonal disorder was found.  She is now referred to me for the same reason.  She currently reports feeling well and also reports ongoing hair loss.  She was admitted about 2 weeks ago to the hospital for fevers and altered mental status and was treated with IV antibiotics for possible pneumonia.  Looking back at her prior CBCs her white cell count fluctuated between 7-15.  She had episodes of tenderness her white cell count and lymphocytosis transiently increases.  ECOG PS- 1  Pain scale- 0   Review of systems- Review of Systems  Constitutional:  Positive for malaise/fatigue. Negative for chills, fever and weight loss.  HENT:  Negative for congestion, ear discharge and nosebleeds.   Eyes:  Negative for blurred vision.  Respiratory:  Negative for cough, hemoptysis, sputum production, shortness of breath and wheezing.   Cardiovascular:  Negative for chest pain, palpitations, orthopnea and claudication.  Gastrointestinal:  Negative for abdominal pain, blood in stool, constipation, diarrhea, heartburn, melena, nausea and vomiting.  Genitourinary:  Negative for dysuria, flank pain, frequency, hematuria and urgency.   Musculoskeletal:  Negative for back pain, joint pain and myalgias.  Skin:  Negative for rash.  Neurological:  Negative for dizziness, tingling, focal weakness, seizures, weakness and headaches.  Endo/Heme/Allergies:  Does not bruise/bleed easily.  Psychiatric/Behavioral:  Negative for depression and suicidal ideas. The patient does not have insomnia.     Allergies  Allergen Reactions   Dust Mite Mixed Allergen Ext [Mite (D. Farinae)]     Respiratory distresss   Nsaids     Acute renal failure   Other Hives    Allergy to Hickory, walnut and Birch trees and all grasses and allergic to Rabbits- patient reports anaphylactic    Sulfa Antibiotics Hives   Latex Itching   Misc. Sulfonamide Containing Compounds Hives    Patient Active Problem List   Diagnosis Date Noted   Community acquired bilateral lower lobe pneumonia 04/23/2023   GERD without esophagitis 04/23/2023   Depression 04/23/2023   Type 2 diabetes mellitus without complications (HCC) 04/23/2023   Acute metabolic encephalopathy 04/23/2023   Hypoxemia 04/23/2023   Hospital discharge follow-up 03/18/2023   Status post THR (total hip replacement) 03/09/2023   Acute renal failure (ARF) (HCC) 12/31/2022   Polyarthritis 12/31/2022   Femoroacetabular impingement of right hip 08/19/2022   Urinary frequency 06/28/2022   S/P TAH (total abdominal hysterectomy) 06/27/2022   Trigger finger, left middle finger 06/20/2022   Laceration of right thigh 05/21/2022   Carpal tunnel syndrome, left upper limb    Smoker 04/02/2022   Tobacco abuse 04/02/2022   Long-term use of high-risk medication 04/02/2022   Contact with and (suspected) exposure to covid-19 04/02/2022   Situational anxiety 11/21/2021   Vitamin D deficiency 11/21/2021   Hypothyroidism 11/21/2021  Carpal tunnel syndrome, right upper limb 11/02/2021   Bipolar disorder, currently in remission (HCC) 07/21/2021   Controlled diabetes mellitus type 2 with complications (HCC)  07/21/2021   Gastritis 03/06/2021   Preoperative evaluation to rule out surgical contraindication 02/04/2021   Leukocytosis 01/21/2021   Insomnia 01/21/2021   Chronic midline low back pain 08/05/2020   Overweight (BMI 25.0-29.9) 08/05/2020     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   Pneumonia    PTSD (post-traumatic stress disorder)    Smoker    Thyroid disease      Past Surgical History:  Procedure Laterality Date   ABDOMINAL HYSTERECTOMY  12/03/2011   complete   ADENOIDECTOMY, TONSILLECTOMY AND MYRINGOTOMY WITH TUBE PLACEMENT  1977   CARPAL TUNNEL RELEASE Right 03/12/2022   Procedure: RIGHT CARPAL TUNNEL RELEASE;  Surgeon: Marlyne Beards, MD;  Location: [facility];  Service: Orthopedics;  Laterality: Right;   CARPAL TUNNEL RELEASE Left 04/16/2022   Procedure: LEFT CARPAL TUNNEL RELEASE;  Surgeon: Marlyne Beards, MD;  Location: [facility];  Service: Orthopedics;  Laterality: Left;   CHOLECYSTECTOMY  06/08/2011   COLONOSCOPY WITH PROPOFOL N/A 02/05/2022   Procedure: COLONOSCOPY WITH PROPOFOL;  Surgeon: Wyline Mood, MD;  Location: [facility];  Service: Gastroenterology;  Laterality: N/A;   ESOPHAGOGASTRODUODENOSCOPY N/A 02/05/2022   Procedure: ESOPHAGOGASTRODUODENOSCOPY (EGD);  Surgeon: Wyline Mood, MD;  Location: [facility];  Service: Gastroenterology;  Laterality: N/A;   HIP ARTHROSCOPY Right 08/19/2022   Procedure: Right hip arthroscopy, acetabuloplasty, labral repair, femoral osteochondroplasty, capsular closure;  Surgeon: Signa Kell, MD;  Location: [facility];  Service: Orthopedics;  Laterality: Right;   KNEE ARTHROSCOPY Right 1993    KNEE ARTHROSCOPY Left 1998   LUMBAR LAMINECTOMY Left 2021   TOTAL HIP ARTHROPLASTY Right 03/09/2023   Procedure: TOTAL HIP ARTHROPLASTY ANTERIOR APPROACH;  Surgeon: Reinaldo Berber, MD;  Location: [facility];  Service: Orthopedics;  Laterality: Right;    Social History   Socioeconomic History   Marital status: Married    Spouse name: Misty Stanley   Number of children: 0   Years of education: Not on file   Highest education level: Associate degree: occupational, Scientist, product/process development, or vocational program  Occupational History   Not on file  Tobacco Use   Smoking status: Former    Current packs/day: 0.00    Average packs/day: 0.5 packs/day for 42.0 years (21.0 ttl pk-yrs)    Types: Cigarettes    Start date: 01/25/1981    Quit date: 01/26/2023    Years since quitting: 0.2   Smokeless tobacco: Never  Vaping Use   Vaping status: Former   Substances: Nicotine  Substance and Sexual Activity   Alcohol use: Not Currently   Drug use: Not Currently   Sexual activity: Yes    Partners: Female    Comment: married to wife  Other Topics Concern   Not on file  Social History Narrative   Not on file   Social Determinants of Health   Financial Resource Strain: Low Risk  (02/09/2023)   Overall Financial Resource Strain (CARDIA)    Difficulty of Paying Living Expenses: Not hard at  all  Food Insecurity: No Food Insecurity (05/11/2023)   Hunger Vital Sign    Worried About Running Out of Food in the Last Year: Never true    Ran Out of Food in the Last Year: Never true  Transportation Needs: No Transportation Needs (05/11/2023)   PRAPARE - Administrator, Civil Service (Medical): No    Lack of Transportation (Non-Medical): No  Physical Activity: Unknown (02/09/2023)   Exercise Vital Sign    Days of Exercise per Week: 0 days    Minutes of Exercise per Session: Not on file  Stress: Patient Declined (02/09/2023)   [employer] of Occupational Health - Occupational Stress Questionnaire    Feeling  of Stress : Patient declined  Social Connections: Moderately Isolated (02/09/2023)   Social Connection and Isolation Panel [NHANES]    Frequency of Communication with Friends and Family: More than three times a week    Frequency of Social Gatherings with Friends and Family: Patient declined    Attends Religious Services: Never    Database administrator or Organizations: No    Attends Engineer, structural: Not on file    Marital Status: Married  Catering manager Violence: Not At Risk (04/24/2023)   Humiliation, Afraid, Rape, and Kick questionnaire    Fear of Current or Ex-Partner: No    Emotionally Abused: No    Physically Abused: No    Sexually Abused: No     Family History  Problem Relation Age of Onset   Hypertension Mother    Thyroid disease Mother    Lung cancer Father 57       carcinoid, right lung   Ulcerative colitis Father    Renal Disease Maternal Grandfather    Diabetes Maternal Grandfather        Type2   Glaucoma Maternal Grandmother    Stroke Paternal Grandfather    Hypertension Paternal Grandfather      Current Outpatient Medications:    acetaminophen (TYLENOL) 500 MG tablet, Take 2 tablets (1,000 mg total) by mouth every 8 (eight) hours., Disp: 30 tablet, Rfl: 0   albuterol (VENTOLIN HFA) 108 (90 Base) MCG/ACT inhaler, Inhale 1-2 puffs into the lungs every 6 (six) hours as needed for wheezing or shortness of breath., Disp: , Rfl:    ALPRAZolam (XANAX) 0.25 MG tablet, TAKE 1 TO 2 TABLETS BY MOUTH 2 TIMES DAILY AS NEEDED FOR ANXIETY (WILL CAUSE DROWSINESS) (Patient taking differently: Take 0.25-0.5 mg by mouth 2 (two) times daily as needed for anxiety.), Disp: 30 tablet, Rfl: 1   aspirin 81 MG chewable tablet, Chew 81 mg by mouth daily., Disp: , Rfl:    docusate sodium (COLACE) 100 MG capsule, Take 100 mg by mouth daily., Disp: , Rfl:    levothyroxine (SYNTHROID) 50 MCG tablet, TAKE 1 TABLET BY MOUTH DAILY BEFORE BREAKFAST., Disp: 90 tablet, Rfl: 1    loratadine (CLARITIN) 10 MG tablet, Take 10 mg by mouth daily as needed., Disp: , Rfl:    metFORMIN (GLUCOPHAGE) 1000 MG tablet, TAKE 1 TABLET BY MOUTH TWICE A DAY WITH FOOD, Disp: 60 tablet, Rfl: 1   methocarbamol (ROBAXIN) 500 MG tablet, Take 500 mg by mouth 3 (three) times daily., Disp: , Rfl:    oxybutynin (DITROPAN XL) 15 MG 24 hr tablet, Take 1 tablet (15 mg total) by mouth at bedtime., Disp: 90 tablet, Rfl: 1   oxyCODONE (OXY IR/ROXICODONE) 5 MG immediate release tablet, Take 0.5-1 tablets (2.5-5 mg total) by mouth every 6 (six)  hours as needed for severe pain or breakthrough pain., Disp: 20 tablet, Rfl: 0   pantoprazole (PROTONIX) 40 MG tablet, Take 1 tablet (40 mg total) by mouth daily., Disp: 90 tablet, Rfl: 3   QUEtiapine (SEROQUEL) 300 MG tablet, Take 1 tablet (300 mg total) by mouth at bedtime. TAKE 1 TABLET BY MOUTH EVERYDAY AT BEDTIME Strength: 300 mg, Disp: 90 tablet, Rfl: 1   sertraline (ZOLOFT) 100 MG tablet, TAKE 1 TABLET BY MOUTH DAILY, Disp: 90 tablet, Rfl: 1   zolpidem (AMBIEN) 5 MG tablet, TAKE 1 TABLET BY MOUTH AT BEDTIME AS NEEDED FOR SLEEP (Patient taking differently: Take 5 mg by mouth at bedtime as needed for sleep. for sleep), Disp: 30 tablet, Rfl: 5   ondansetron (ZOFRAN) 4 MG tablet, Take 1 tablet (4 mg total) by mouth every 6 (six) hours as needed for nausea. (Patient not taking: Reported on 05/05/2023), Disp: 20 tablet, Rfl: 0   traMADol (ULTRAM) 50 MG tablet, Take 1 tablet (50 mg total) by mouth every 6 (six) hours as needed for moderate pain. (Patient not taking: Reported on 05/05/2023), Disp: 30 tablet, Rfl: 0   Physical exam:  Vitals:   05/11/23 0850  BP: 112/79  Pulse: 72  Resp: 18  Temp: (!) 96.7 F (35.9 C)  TempSrc: Tympanic  SpO2: 99%  Weight: 162 lb 14.4 oz (73.9 kg)  Height: 5\' 4"  (1.626 m)   Physical Exam Cardiovascular:     Rate and Rhythm: Normal rate and regular rhythm.     Heart sounds: Normal heart sounds.  Pulmonary:     Effort: Pulmonary  effort is normal.     Breath sounds: Normal breath sounds.  Abdominal:     General: Bowel sounds are normal.     Palpations: Abdomen is soft.     Comments: No palpable splenomegaly  Lymphadenopathy:     Comments: No palpable cervical, supraclavicular, axillary or inguinal adenopathy    Skin:    General: Skin is warm and dry.  Neurological:     Mental Status: She is alert and oriented to person, place, and time.           Latest Ref Rng & Units 05/05/2023    2:35 PM  CMP  Glucose 70 - 99 mg/dL 76   BUN 6 - 23 mg/dL 16   Creatinine 1.61 - 1.20 mg/dL 0.96   Sodium 045 - 409 mEq/L 138   Potassium 3.5 - 5.1 mEq/L 4.7   Chloride 96 - 112 mEq/L 102   CO2 19 - 32 mEq/L 25   Calcium 8.4 - 10.5 mg/dL 81.1   Total Protein 6.0 - 8.3 g/dL 7.5   Total Bilirubin 0.2 - 1.2 mg/dL 0.3   Alkaline Phos 39 - 117 U/L 72   AST 0 - 37 U/L 17   ALT 0 - 35 U/L 14       Latest Ref Rng & Units 05/05/2023    2:35 PM  CBC  WBC 4.0 - 10.5 K/uL 13.9   Hemoglobin 12.0 - 15.0 g/dL 91.4   Hematocrit 78.2 - 46.0 % 37.4   Platelets 150.0 - 400.0 K/uL 525.0     No images are attached to the encounter.  CT HIP RIGHT W CONTRAST  Result Date: 04/23/2023 CLINICAL DATA:  Burning and warmth to the right hip incision from the right hip replacement, date of surgery 03/09/2023. Suspected hip replacement infection. Evaluate for CT evidence. EXAM: CT OF THE LOWER RIGHT EXTREMITY WITH CONTRAST TECHNIQUE: Multidetector CT  imaging of the lower right extremity was performed according to the standard protocol following intravenous contrast administration. RADIATION DOSE REDUCTION: This exam was performed according to the departmental dose-optimization program which includes automated exposure control, adjustment of the mA and/or kV according to patient size and/or use of iterative reconstruction technique. CONTRAST:  75mL OMNIPAQUE IOHEXOL 350 MG/ML SOLN COMPARISON:  CT pelvis and right femur 07/31/2022. FINDINGS:  Bones/Joint/Cartilage The bone mineralization appears within normal limits. Right hip total joint replacement is new since 07/31/2022. There is secondary metallic spray artifact associated but no convincing loosening. There is no dislocation. No perihardware fracture is evident and no aggressive regional bone lesion is seen suspicious for particle disease or acute osteomyelitis. The bone around the acetabular component securing screws is unremarkable. There is no substantial joint effusion, allowing for extensive spray artifact at the level of the joint. There are mild spurring changes of the right SI joint, symphysis pubis, L5-S1 facet joints. Ligaments Suboptimally assessed by CT. Muscles and Tendons No acute regional tendon abnormality is seen within the limits of CT technique. There is normal muscle bulk for the patient's age. Within the upper reaches of the vastus lateralis muscle at the level of the subtrochanteric proximal femoral shaft, there is a hypodense faintly rim enhancing collection measuring 2.7 x 2.3 x 3 cm and 41 Hounsfield units, unclear if this is a viscous abscess, hematoma or complex seroma but its contents are above the usual density of fluid. At this same level there is a subcutaneous fluid collection on 7:85, measuring 1.8 x 1.4 cm with a Hounsfield density of 13. This collection may communicate with the intramuscular collection over a narrow channel on 7:85. There is a small amount of calcific debris within the larger intramuscular collection. No other intramuscular abnormality is seen. No other deep soft tissue pathologic process is suspected. Soft tissues There is stranding around the subcutaneous fluid collection described above, probably inflammatory etiology this far out from surgery. There are mildly enlarged right inguinal chain nodes most likely reactive. Within the right hemipelvis no mass, adenopathy or fluid collections are seen. The visualized pelvic bowel is normal caliber.  Uterus is absent. There is trace ascites in the posterior pelvis, nonspecific. The visualized bladder is unremarkable. IMPRESSION: 1. 2.7 x 2.3 x 3 cm hypodense faintly rim enhancing collection in the upper vastus lateralis muscle at the level of the subtrochanteric proximal right femur, unclear if this is a viscous abscess, hematoma or complex seroma but its contents are above that of simple fluid. 2. 1.8 x 1.4 cm subcutaneous fluid collection at this same level, may communicate with the intramuscular collection over a narrow channel on 7:85. 3. Right hip total joint replacement with secondary metallic spray artifact but no convincing evidence of loosening, acute osteomyelitis or perihardware fracture. 4. Mildly enlarged right inguinal chain nodes most likely reactive. 5. Trace ascites in the posterior pelvis, nonspecific. Electronically Signed   By: Almira Bar M.D.   On: 04/23/2023 02:45   CT Angio Chest PE W/Cm &/Or Wo Cm  Result Date: 04/23/2023 CLINICAL DATA:  Pulmonary embolism suspected. Low to intermediate probability. Negative D-dimer. Hypoxemia noted today. Right total hip arthroplasty procedure performed 03/09/2023. EXAM: CT ANGIOGRAPHY CHEST WITH CONTRAST TECHNIQUE: Multidetector CT imaging of the chest was performed using the standard protocol during bolus administration of intravenous contrast. Multiplanar CT image reconstructions and MIPs were obtained to evaluate the vascular anatomy. RADIATION DOSE REDUCTION: This exam was performed according to the departmental dose-optimization program which includes automated  exposure control, adjustment of the mA and/or kV according to patient size and/or use of iterative reconstruction technique. CONTRAST:  75mL OMNIPAQUE IOHEXOL 350 MG/ML SOLN COMPARISON:  Portable chest today, PA Lat chest 02/13/2023, and CTA chest 12/25/2021. FINDINGS: Cardiovascular: The cardiac size is upper limits of normal. There is no substantial pericardial effusion. The aorta  and great vessels are normal. There are no visible coronary artery calcifications. The pulmonary veins are nondistended. The pulmonary trunk is upper limits of normal in caliber. No arterial embolic disease is seen. There are no findings consistent with acute right heart strain. Mediastinum/Nodes: There are bilateral mildly prominent hilar lymph nodes up to 1.1 cm in short axis on both sides. There are few similar sized subcarinal, left-sided prevascular space and right paratracheal nodes. No new or interval enlarged adenopathy is seen. There is no bulky or encasing adenopathy. Thyroid gland and axillary spaces are unremarkable. The thoracic esophagus, thoracic trachea and main bronchi are unremarkable. Lungs/Pleura: There are bilateral minimal layering pleural effusions. There is no pneumothorax. There are posterior basal opacities in both lower lobes which could represent atelectasis or pneumonia, slightly greater on the right. Bronchial thickening is noted in both lower lobes. There is mild posterior atelectasis in the upper lobes. The lungs are otherwise clear.  No lung nodules are seen. Upper Abdomen: No acute abnormality. Status post cholecystectomy. Mild hepatic steatosis. Chronic elevated right hemidiaphragm. Musculoskeletal: There are degenerative changes of the thoracic spine. No acute osseous abnormality or aggressive lesions. No chest wall mass is seen. Review of the MIP images confirms the above findings. IMPRESSION: 1. Upper limit of normal pulmonary trunk caliber with no evidence of arterial emboli. 2. Borderline cardiomegaly.  No venous dilatation or edema. 3. Bilateral lower lobe posterior basal opacities which could represent atelectasis or pneumonia, with bronchial thickening in both lower lobes. 4. Mildly prominent hilar lymph nodes, but unchanged. 5. Hepatic steatosis. Electronically Signed   By: Almira Bar M.D.   On: 04/23/2023 02:17   CT Head Wo Contrast  Result Date:  04/23/2023 CLINICAL DATA:  Severe headache with mental status changes. EXAM: CT HEAD WITHOUT CONTRAST TECHNIQUE: Contiguous axial images were obtained from the base of the skull through the vertex without intravenous contrast. RADIATION DOSE REDUCTION: This exam was performed according to the departmental dose-optimization program which includes automated exposure control, adjustment of the mA and/or kV according to patient size and/or use of iterative reconstruction technique. COMPARISON:  None Available. FINDINGS: Brain: No evidence of acute infarction, hemorrhage, hydrocephalus, extra-axial collection or mass lesion/mass effect. Vascular: There are scattered calcifications in the carotid siphons. No hyperdense central vessel is seen. Skull: Negative for fractures or focal lesions. Sinuses/Orbits: No acute finding. Other: None. IMPRESSION: No acute intracranial CT findings. Atherosclerosis. Electronically Signed   By: Almira Bar M.D.   On: 04/23/2023 00:07   DG Chest Port 1 View  Result Date: 04/22/2023 CLINICAL DATA:  Questionable sepsis. Check for infiltrates or other abnormality. Headache and neck pain also. EXAM: PORTABLE CHEST 1 VIEW COMPARISON:  PA and lateral study 02/13/2023 FINDINGS: The heart size and mediastinal contours are within normal limits. Both lungs are clear. The visualized skeletal structures are unremarkable. IMPRESSION: No evidence of acute chest disease.  Stable chest. Electronically Signed   By: Almira Bar M.D.   On: 04/22/2023 23:11    Assessment and plan- Patient is a 52 y.o. female referred for lymphocytosis and thrombocytosis  Patient has waxing and waning lymphocytosis over the last 3 to 4 years  without a clear rising trend.  Peripheral flow cytometry did not show any evidence of pulmonary disorder and showed increase in multiple lymphocyte subsets suggestive of a reactive process.  I suspect the same is happening this time as well especially since patient been  admitted recently for an episode of pneumonia.  I will repeat his CBC with differential, flow cytometry as well as JAK2, CALR and MPL testing given her thrombocytosis although I suspect this is all reactive.  In person or video visit with me in 2 to 3 weeks time  Fatigue: Recent iron studies and TSH were normal.  I will check her B12 levels   Thank you for this kind referral and the opportunity to participate in the care of this  Patient   Visit Diagnosis 1. Thrombocytosis   2. Lymphocytosis     Dr. Owens Shark, MD, MPH [facility] [phone] 05/11/2023

## 2023-05-12 ENCOUNTER — Telehealth: Payer: Self-pay | Admitting: *Deleted

## 2023-05-12 NOTE — Telephone Encounter (Signed)
Dr. Smith Robert wanted me to call the patient and let her know that the B12 level is very low and she suggested that she could get some B12 injections or she can use the oral B12 pill.  Patient says that she wants to try the B12 injections.  I have sent a message to [facility] about setting up B12 injections and they will get back to the patient with the date.

## 2023-05-14 LAB — COMP PANEL: LEUKEMIA/LYMPHOMA

## 2023-05-15 ENCOUNTER — Inpatient Hospital Stay: Payer: 59

## 2023-05-18 LAB — CALRETICULIN (CALR) MUTATION ANALYSIS

## 2023-05-20 LAB — JAK2 GENOTYPR

## 2023-05-22 ENCOUNTER — Ambulatory Visit: Payer: 59

## 2023-05-27 ENCOUNTER — Encounter: Payer: Self-pay | Admitting: Internal Medicine

## 2023-05-27 DIAGNOSIS — G4709 Other insomnia: Secondary | ICD-10-CM | POA: Diagnosis not present

## 2023-05-27 DIAGNOSIS — M1991 Primary osteoarthritis, unspecified site: Secondary | ICD-10-CM | POA: Diagnosis not present

## 2023-05-27 DIAGNOSIS — Z716 Tobacco abuse counseling: Secondary | ICD-10-CM | POA: Diagnosis not present

## 2023-05-27 DIAGNOSIS — Z720 Tobacco use: Secondary | ICD-10-CM | POA: Diagnosis not present

## 2023-05-27 DIAGNOSIS — M961 Postlaminectomy syndrome, not elsewhere classified: Secondary | ICD-10-CM | POA: Diagnosis not present

## 2023-05-27 DIAGNOSIS — M542 Cervicalgia: Secondary | ICD-10-CM | POA: Diagnosis not present

## 2023-05-27 DIAGNOSIS — M545 Low back pain, unspecified: Secondary | ICD-10-CM | POA: Diagnosis not present

## 2023-05-27 DIAGNOSIS — F1120 Opioid dependence, uncomplicated: Secondary | ICD-10-CM | POA: Diagnosis not present

## 2023-05-27 DIAGNOSIS — I10 Essential (primary) hypertension: Secondary | ICD-10-CM | POA: Diagnosis not present

## 2023-05-27 DIAGNOSIS — K219 Gastro-esophageal reflux disease without esophagitis: Secondary | ICD-10-CM | POA: Diagnosis not present

## 2023-05-27 DIAGNOSIS — E119 Type 2 diabetes mellitus without complications: Secondary | ICD-10-CM | POA: Diagnosis not present

## 2023-05-27 DIAGNOSIS — G894 Chronic pain syndrome: Secondary | ICD-10-CM | POA: Diagnosis not present

## 2023-05-28 ENCOUNTER — Inpatient Hospital Stay: Payer: 59 | Attending: Oncology

## 2023-05-28 DIAGNOSIS — E538 Deficiency of other specified B group vitamins: Secondary | ICD-10-CM | POA: Insufficient documentation

## 2023-05-28 DIAGNOSIS — D75839 Thrombocytosis, unspecified: Secondary | ICD-10-CM | POA: Insufficient documentation

## 2023-05-28 MED ORDER — CYANOCOBALAMIN 1000 MCG/ML IJ SOLN
1000.0000 ug | Freq: Once | INTRAMUSCULAR | Status: AC
  Administered 2023-05-28: 1000 ug via INTRAMUSCULAR
  Filled 2023-05-28: qty 1

## 2023-05-30 ENCOUNTER — Other Ambulatory Visit: Payer: Self-pay | Admitting: Internal Medicine

## 2023-05-30 DIAGNOSIS — G4700 Insomnia, unspecified: Secondary | ICD-10-CM

## 2023-05-30 DIAGNOSIS — F319 Bipolar disorder, unspecified: Secondary | ICD-10-CM

## 2023-06-01 ENCOUNTER — Encounter: Payer: Self-pay | Admitting: Oncology

## 2023-06-01 ENCOUNTER — Inpatient Hospital Stay (HOSPITAL_BASED_OUTPATIENT_CLINIC_OR_DEPARTMENT_OTHER): Payer: 59 | Admitting: Oncology

## 2023-06-01 ENCOUNTER — Encounter: Payer: Self-pay | Admitting: Internal Medicine

## 2023-06-01 ENCOUNTER — Other Ambulatory Visit: Payer: Self-pay | Admitting: Internal Medicine

## 2023-06-01 DIAGNOSIS — D75839 Thrombocytosis, unspecified: Secondary | ICD-10-CM

## 2023-06-01 DIAGNOSIS — G4700 Insomnia, unspecified: Secondary | ICD-10-CM

## 2023-06-01 DIAGNOSIS — E538 Deficiency of other specified B group vitamins: Secondary | ICD-10-CM | POA: Diagnosis not present

## 2023-06-01 DIAGNOSIS — F319 Bipolar disorder, unspecified: Secondary | ICD-10-CM

## 2023-06-01 MED ORDER — ALPRAZOLAM 0.25 MG PO TABS
0.2500 mg | ORAL_TABLET | Freq: Two times a day (BID) | ORAL | 5 refills | Status: DC | PRN
Start: 2023-06-01 — End: 2023-12-10

## 2023-06-01 NOTE — Progress Notes (Signed)
I connected with Kathleen Carr on 06/01/23 at  3:00 PM EDT by video enabled telemedicine visit and verified that I am speaking with the correct person using two identifiers.   I discussed the limitations, risks, security and privacy concerns of performing an evaluation and management service by telemedicine and the availability of in-person appointments. I also discussed with the patient that there may be a patient responsible charge related to this service. The patient expressed understanding and agreed to proceed.  Other persons participating in the visit and their role in the encounter:  significant other  Patient's location:  home Provider's location:  work  Stage manager Complaint:  discuss results of blood work  History of present illness: patient is a 52 year old female who was last seen by me in 2022 and was referred to me for lymphocytosis back then.  She had a peripheral flow cytometry which showed multiple lymphocyte subsets that were elevated suggestive of a reactive process.  No clonal disorder was found.  She is now referred to me for the same reason.  She currently reports feeling well and also reports ongoing hair loss.  She was admitted about 2 weeks ago to the hospital for fevers and altered mental status and was treated with IV antibiotics for possible pneumonia.  Looking back at her prior CBCs her white cell count fluctuated between 7-15.  She had episodes of tenderness her white cell count and lymphocytosis transiently increases.   Results of blood work from July 2024 showed no evidence of JAK2, CALR, MPL mutation.  Flow cytometry did not reveal any immunophenotypic abnormality.  Interval history no acute issues since last visit.  She does have ongoing fatigue   Review of Systems  Constitutional:  Positive for malaise/fatigue. Negative for chills, fever and weight loss.  HENT:  Negative for congestion, ear discharge and nosebleeds.   Eyes:  Negative for blurred vision.  Respiratory:   Negative for cough, hemoptysis, sputum production, shortness of breath and wheezing.   Cardiovascular:  Negative for chest pain, palpitations, orthopnea and claudication.  Gastrointestinal:  Negative for abdominal pain, blood in stool, constipation, diarrhea, heartburn, melena, nausea and vomiting.  Genitourinary:  Negative for dysuria, flank pain, frequency, hematuria and urgency.  Musculoskeletal:  Negative for back pain, joint pain and myalgias.  Skin:  Negative for rash.  Neurological:  Negative for dizziness, tingling, focal weakness, seizures, weakness and headaches.  Endo/Heme/Allergies:  Does not bruise/bleed easily.  Psychiatric/Behavioral:  Negative for depression and suicidal ideas. The patient does not have insomnia.     Allergies  Allergen Reactions   Dust Mite Mixed Allergen Ext [Mite (D. Farinae)]     Respiratory distresss   Nsaids     Acute renal failure   Other Hives    Allergy to Hickory, walnut and Birch trees and all grasses and allergic to Rabbits- patient reports anaphylactic    Sulfa Antibiotics Hives   Latex Itching   Misc. 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   Pneumonia    PTSD (post-traumatic stress disorder)    Smoker    Thyroid disease  Past Surgical History:  Procedure Laterality Date   ABDOMINAL HYSTERECTOMY  12/03/2011   complete   ADENOIDECTOMY, TONSILLECTOMY AND MYRINGOTOMY WITH TUBE PLACEMENT  1977   CARPAL TUNNEL RELEASE Right 03/12/2022   Procedure: RIGHT CARPAL TUNNEL RELEASE;  Surgeon: Marlyne Beards, MD;  Location: [facility];  Service: Orthopedics;  Laterality: Right;    CARPAL TUNNEL RELEASE Left 04/16/2022   Procedure: LEFT CARPAL TUNNEL RELEASE;  Surgeon: Marlyne Beards, MD;  Location: [facility];  Service: Orthopedics;  Laterality: Left;   CHOLECYSTECTOMY  06/08/2011   COLONOSCOPY WITH PROPOFOL N/A 02/05/2022   Procedure: COLONOSCOPY WITH PROPOFOL;  Surgeon: Wyline Mood, MD;  Location: [facility];  Service: Gastroenterology;  Laterality: N/A;   ESOPHAGOGASTRODUODENOSCOPY N/A 02/05/2022   Procedure: ESOPHAGOGASTRODUODENOSCOPY (EGD);  Surgeon: Wyline Mood, MD;  Location: [facility];  Service: Gastroenterology;  Laterality: N/A;   HIP ARTHROSCOPY Right 08/19/2022   Procedure: Right hip arthroscopy, acetabuloplasty, labral repair, femoral osteochondroplasty, capsular closure;  Surgeon: Signa Kell, MD;  Location: [facility];  Service: Orthopedics;  Laterality: Right;   KNEE ARTHROSCOPY Right 1993   KNEE ARTHROSCOPY Left 1998   LUMBAR LAMINECTOMY Left 2021   TOTAL HIP ARTHROPLASTY Right 03/09/2023   Procedure: TOTAL HIP ARTHROPLASTY ANTERIOR APPROACH;  Surgeon: Reinaldo Berber, MD;  Location: [facility];  Service: Orthopedics;  Laterality: Right;    Social History   Socioeconomic History   Marital status: Married    Spouse name: Misty Stanley   Number of children: 0   Years of education: Not on file   Highest education level: Associate degree: occupational, Scientist, product/process development, or vocational program  Occupational History   Not on file  Tobacco Use   Smoking status: Former    Current packs/day: 0.00    Average packs/day: 0.5 packs/day for 42.0 years (21.0 ttl pk-yrs)    Types: Cigarettes    Start date: 01/25/1981    Quit date: 01/26/2023    Years since quitting: 0.3   Smokeless tobacco: Never  Vaping Use   Vaping status: Former   Substances: Nicotine  Substance and Sexual Activity   Alcohol use: Not Currently   Drug use: Not Currently   Sexual activity: Yes    Partners: Female    Comment: married to wife  Other Topics Concern   Not on file   Social History Narrative   Not on file   Social Determinants of Health   Financial Resource Strain: Low Risk  (02/09/2023)   Overall Financial Resource Strain (CARDIA)    Difficulty of Paying Living Expenses: Not hard at all  Food Insecurity: No Food Insecurity (05/11/2023)   Hunger Vital Sign    Worried About Running Out of Food in the Last Year: Never true    Ran Out of Food in the Last Year: Never true  Transportation Needs: No Transportation Needs (05/11/2023)   PRAPARE - Administrator, Civil Service (Medical): No    Lack of Transportation (Non-Medical): No  Physical Activity: Unknown (02/09/2023)   Exercise Vital Sign    Days of Exercise per Week: 0 days    Minutes of Exercise per Session: Not on file  Stress: Patient Declined (02/09/2023)   [employer] of Occupational Health - Occupational Stress Questionnaire    Feeling of Stress : Patient declined  Social Connections: Moderately Isolated (02/09/2023)   Social Connection and Isolation Panel [NHANES]    Frequency of Communication with Friends and Family: More than three times a week    Frequency of Social Gatherings  with Friends and Family: Patient declined    Attends Religious Services: Never    Database administrator or Organizations: No    Attends Engineer, structural: Not on file    Marital Status: Married  Catering manager Violence: Not At Risk (04/24/2023)   Humiliation, Afraid, Rape, and Kick questionnaire    Fear of Current or Ex-Partner: No    Emotionally Abused: No    Physically Abused: No    Sexually Abused: No    Family History  Problem Relation Age of Onset   Hypertension Mother    Thyroid disease Mother    Lung cancer Father 76       carcinoid, right lung   Ulcerative colitis Father    Renal Disease Maternal Grandfather    Diabetes Maternal Grandfather        Type2   Glaucoma Maternal Grandmother    Stroke Paternal Grandfather    Hypertension Paternal Grandfather       Current Outpatient Medications:    acetaminophen (TYLENOL) 500 MG tablet, Take 2 tablets (1,000 mg total) by mouth every 8 (eight) hours., Disp: 30 tablet, Rfl: 0   albuterol (VENTOLIN HFA) 108 (90 Base) MCG/ACT inhaler, Inhale 1-2 puffs into the lungs every 6 (six) hours as needed for wheezing or shortness of breath., Disp: , Rfl:    ALPRAZolam (XANAX) 0.25 MG tablet, Take 1-2 tablets (0.25-0.5 mg total) by mouth 2 (two) times daily as needed for anxiety., Disp: 30 tablet, Rfl: 5   aspirin 81 MG chewable tablet, Chew 81 mg by mouth daily., Disp: , Rfl:    docusate sodium (COLACE) 100 MG capsule, Take 100 mg by mouth daily., Disp: , Rfl:    levothyroxine (SYNTHROID) 50 MCG tablet, TAKE 1 TABLET BY MOUTH DAILY BEFORE BREAKFAST., Disp: 90 tablet, Rfl: 1   loratadine (CLARITIN) 10 MG tablet, Take 10 mg by mouth daily as needed., Disp: , Rfl:    metFORMIN (GLUCOPHAGE) 1000 MG tablet, TAKE 1 TABLET BY MOUTH TWICE A DAY WITH FOOD, Disp: 60 tablet, Rfl: 1   methocarbamol (ROBAXIN) 500 MG tablet, Take 500 mg by mouth 3 (three) times daily., Disp: , Rfl:    ondansetron (ZOFRAN) 4 MG tablet, Take 1 tablet (4 mg total) by mouth every 6 (six) hours as needed for nausea. (Patient not taking: Reported on 05/05/2023), Disp: 20 tablet, Rfl: 0   oxybutynin (DITROPAN XL) 15 MG 24 hr tablet, Take 1 tablet (15 mg total) by mouth at bedtime., Disp: 90 tablet, Rfl: 1   oxyCODONE (OXY IR/ROXICODONE) 5 MG immediate release tablet, Take 0.5-1 tablets (2.5-5 mg total) by mouth every 6 (six) hours as needed for severe pain or breakthrough pain., Disp: 20 tablet, Rfl: 0   pantoprazole (PROTONIX) 40 MG tablet, Take 1 tablet (40 mg total) by mouth daily., Disp: 90 tablet, Rfl: 3   QUEtiapine (SEROQUEL) 300 MG tablet, Take 1 tablet (300 mg total) by mouth at bedtime. TAKE 1 TABLET BY MOUTH EVERYDAY AT BEDTIME Strength: 300 mg, Disp: 90 tablet, Rfl: 1   sertraline (ZOLOFT) 100 MG tablet, TAKE 1 TABLET BY MOUTH DAILY, Disp: 90  tablet, Rfl: 1   traMADol (ULTRAM) 50 MG tablet, Take 1 tablet (50 mg total) by mouth every 6 (six) hours as needed for moderate pain. (Patient not taking: Reported on 05/05/2023), Disp: 30 tablet, Rfl: 0   zolpidem (AMBIEN) 5 MG tablet, TAKE 1 TABLET BY MOUTH AT BEDTIME AS NEEDED FOR SLEEP (Patient taking differently: Take 5 mg by  mouth at bedtime as needed for sleep. for sleep), Disp: 30 tablet, Rfl: 5  No results found.  No images are attached to the encounter.      Latest Ref Rng & Units 05/05/2023    2:35 PM  CMP  Glucose 70 - 99 mg/dL 76   BUN 6 - 23 mg/dL 16   Creatinine 1.61 - 1.20 mg/dL 0.96   Sodium 045 - 409 mEq/L 138   Potassium 3.5 - 5.1 mEq/L 4.7   Chloride 96 - 112 mEq/L 102   CO2 19 - 32 mEq/L 25   Calcium 8.4 - 10.5 mg/dL 81.1   Total Protein 6.0 - 8.3 g/dL 7.5   Total Bilirubin 0.2 - 1.2 mg/dL 0.3   Alkaline Phos 39 - 117 U/L 72   AST 0 - 37 U/L 17   ALT 0 - 35 U/L 14       Latest Ref Rng & Units 05/11/2023    9:39 AM  CBC  WBC 4.0 - 10.5 K/uL 9.6   Hemoglobin 12.0 - 15.0 g/dL 91.4   Hematocrit 78.2 - 46.0 % 37.9   Platelets 150 - 400 K/uL 441      Observation/objective: Appears in no acute distress over video visit today.  Breathing is nonlabored  Assessment and plan: Patient is a 52 year old female referred for thrombocytosis likely reactive  Thrombocytosis: Overall it is downtrending and likely reactive secondary to her episode of pneumonia recently.  Workup for myeloproliferative disorder including CALR, JAK2, MPL mutation and peripheral flow cytometry was negative.  Thrombocytosis does not require any further workup at this time. Patient was found to have evidence of B12 deficiency with a B12 level of 194.  She received her first dose of B12 injection in the cancer center about 4 days ago.  She is willing to give herself B12 injections and we will send subsequent injections to her pharmacy.   Follow-up instructions: Labs in 4 and 8 months and I will see  her back in 8 months  I discussed the assessment and treatment plan with the patient. The patient was provided an opportunity to ask questions and all were answered. The patient agreed with the plan and demonstrated an understanding of the instructions.   The patient was advised to call back or seek an in-person evaluation if the symptoms worsen or if the condition fails to improve as anticipated.  I provided 11 minutes of face-to-face video visit time during this encounter,including reviewing labs and coordinating future appointments  Visit Diagnosis: 1. Thrombocytosis   2. B12 deficiency     Dr. Owens Shark, MD, MPH [facility] Tel- [phone] 06/01/2023 4:13 PM

## 2023-06-02 ENCOUNTER — Ambulatory Visit: Payer: 59 | Admitting: Internal Medicine

## 2023-06-03 ENCOUNTER — Other Ambulatory Visit: Payer: Self-pay | Admitting: *Deleted

## 2023-06-03 ENCOUNTER — Encounter: Payer: Self-pay | Admitting: *Deleted

## 2023-06-03 MED ORDER — "SYRINGE 25G X 1"" 3 ML MISC": 1.0000 | 0 refills | Status: DC

## 2023-06-03 MED ORDER — CYANOCOBALAMIN 1000 MCG/ML IJ SOLN: 1000.0000 ug | INTRAMUSCULAR | 11 refills | Status: DC

## 2023-06-11 ENCOUNTER — Encounter (INDEPENDENT_AMBULATORY_CARE_PROVIDER_SITE_OTHER): Payer: Self-pay

## 2023-06-15 DIAGNOSIS — M65332 Trigger finger, left middle finger: Secondary | ICD-10-CM | POA: Diagnosis not present

## 2023-06-18 ENCOUNTER — Encounter: Payer: Self-pay | Admitting: Pulmonary Disease

## 2023-07-01 ENCOUNTER — Other Ambulatory Visit: Payer: Self-pay | Admitting: Internal Medicine

## 2023-07-06 DIAGNOSIS — Z716 Tobacco abuse counseling: Secondary | ICD-10-CM | POA: Diagnosis not present

## 2023-07-06 DIAGNOSIS — M545 Low back pain, unspecified: Secondary | ICD-10-CM | POA: Diagnosis not present

## 2023-07-06 DIAGNOSIS — E119 Type 2 diabetes mellitus without complications: Secondary | ICD-10-CM | POA: Diagnosis not present

## 2023-07-06 DIAGNOSIS — F1120 Opioid dependence, uncomplicated: Secondary | ICD-10-CM | POA: Diagnosis not present

## 2023-07-06 DIAGNOSIS — I10 Essential (primary) hypertension: Secondary | ICD-10-CM | POA: Diagnosis not present

## 2023-07-06 DIAGNOSIS — M542 Cervicalgia: Secondary | ICD-10-CM | POA: Diagnosis not present

## 2023-07-06 DIAGNOSIS — G4709 Other insomnia: Secondary | ICD-10-CM | POA: Diagnosis not present

## 2023-07-06 DIAGNOSIS — K219 Gastro-esophageal reflux disease without esophagitis: Secondary | ICD-10-CM | POA: Diagnosis not present

## 2023-07-06 DIAGNOSIS — Z720 Tobacco use: Secondary | ICD-10-CM | POA: Diagnosis not present

## 2023-07-06 DIAGNOSIS — M1991 Primary osteoarthritis, unspecified site: Secondary | ICD-10-CM | POA: Diagnosis not present

## 2023-07-06 DIAGNOSIS — M961 Postlaminectomy syndrome, not elsewhere classified: Secondary | ICD-10-CM | POA: Diagnosis not present

## 2023-07-06 DIAGNOSIS — Z79891 Long term (current) use of opiate analgesic: Secondary | ICD-10-CM | POA: Diagnosis not present

## 2023-07-06 DIAGNOSIS — G894 Chronic pain syndrome: Secondary | ICD-10-CM | POA: Diagnosis not present

## 2023-07-08 ENCOUNTER — Ambulatory Visit (INDEPENDENT_AMBULATORY_CARE_PROVIDER_SITE_OTHER): Payer: 59

## 2023-07-08 ENCOUNTER — Ambulatory Visit (INDEPENDENT_AMBULATORY_CARE_PROVIDER_SITE_OTHER): Payer: 59 | Admitting: Internal Medicine

## 2023-07-08 ENCOUNTER — Encounter: Payer: Self-pay | Admitting: Internal Medicine

## 2023-07-08 VITALS — BP 106/74 | HR 75 | Temp 98.0°F | Ht 64.0 in | Wt 162.6 lb

## 2023-07-08 DIAGNOSIS — G2581 Restless legs syndrome: Secondary | ICD-10-CM | POA: Diagnosis not present

## 2023-07-08 DIAGNOSIS — E559 Vitamin D deficiency, unspecified: Secondary | ICD-10-CM

## 2023-07-08 DIAGNOSIS — Z780 Asymptomatic menopausal state: Secondary | ICD-10-CM | POA: Diagnosis not present

## 2023-07-08 DIAGNOSIS — R918 Other nonspecific abnormal finding of lung field: Secondary | ICD-10-CM | POA: Diagnosis not present

## 2023-07-08 DIAGNOSIS — E039 Hypothyroidism, unspecified: Secondary | ICD-10-CM | POA: Diagnosis not present

## 2023-07-08 DIAGNOSIS — E119 Type 2 diabetes mellitus without complications: Secondary | ICD-10-CM | POA: Diagnosis not present

## 2023-07-08 DIAGNOSIS — Z79899 Other long term (current) drug therapy: Secondary | ICD-10-CM

## 2023-07-08 DIAGNOSIS — Z23 Encounter for immunization: Secondary | ICD-10-CM | POA: Diagnosis not present

## 2023-07-08 DIAGNOSIS — J189 Pneumonia, unspecified organism: Secondary | ICD-10-CM | POA: Diagnosis not present

## 2023-07-08 DIAGNOSIS — M8580 Other specified disorders of bone density and structure, unspecified site: Secondary | ICD-10-CM | POA: Diagnosis not present

## 2023-07-08 DIAGNOSIS — E118 Type 2 diabetes mellitus with unspecified complications: Secondary | ICD-10-CM | POA: Diagnosis not present

## 2023-07-08 DIAGNOSIS — E611 Iron deficiency: Secondary | ICD-10-CM | POA: Diagnosis not present

## 2023-07-08 LAB — COMPREHENSIVE METABOLIC PANEL
ALT: 12 U/L (ref 0–35)
AST: 14 U/L (ref 0–37)
Albumin: 4.2 g/dL (ref 3.5–5.2)
Alkaline Phosphatase: 56 U/L (ref 39–117)
BUN: 17 mg/dL (ref 6–23)
CO2: 27 meq/L (ref 19–32)
Calcium: 9.7 mg/dL (ref 8.4–10.5)
Chloride: 103 meq/L (ref 96–112)
Creatinine, Ser: 0.95 mg/dL (ref 0.40–1.20)
GFR: 69.24 mL/min (ref >60.00)
Glucose, Bld: 88 mg/dL (ref 70–99)
Potassium: 4.3 meq/L (ref 3.5–5.1)
Sodium: 138 meq/L (ref 135–145)
Total Bilirubin: 0.3 mg/dL (ref 0.2–1.2)
Total Protein: 7.1 g/dL (ref 6.0–8.3)

## 2023-07-08 LAB — HEMOGLOBIN A1C: Hgb A1c MFr Bld: 6.4 % (ref 4.6–6.5)

## 2023-07-08 LAB — VITAMIN D 25 HYDROXY (VIT D DEFICIENCY, FRACTURES): VITD: 32.72 ng/mL (ref 30.00–100.00)

## 2023-07-08 LAB — TSH: TSH: 2.24 u[IU]/mL (ref 0.35–5.50)

## 2023-07-08 MED ORDER — ROPINIROLE HCL 0.25 MG PO TABS: 0.2500 mg | ORAL_TABLET | Freq: Three times a day (TID) | ORAL | 1 refills | Status: DC

## 2023-07-08 NOTE — Assessment & Plan Note (Signed)
Starting requip,  rechecking iron stores

## 2023-07-08 NOTE — Progress Notes (Signed)
Subjective:  Patient ID: Kathleen Carr, female    DOB: 1971/10/25  Age: 52 y.o. MRN: [id number]  CC: The primary encounter diagnosis was Vitamin D deficiency. Diagnoses of Long-term use of high-risk medication, Hypothyroidism, unspecified type, Controlled type 2 diabetes mellitus with complication, without long-term current use of insulin (HCC), Iron deficiency, Osteopenia after menopause, Community acquired bilateral lower lobe pneumonia, Need for influenza vaccination, Type 2 diabetes mellitus without complication, without long-term current use of insulin (HCC), and Restless legs syndrome were also pertinent to this visit.   HPI Jaydalynn D. Nastri presents for  Chief Complaint  Patient presents with   restless legs   1) "my legs hate me"   several months of feeling a "crawling " sensation relieved transiently . Also having constant left leg spasms .  Restless  legs starts at bedtime .  Has tried using magnesium to no avail.    Several years ago was diagnosed with RLS and used requip  2) right hip pain  surgery May 13 complicated by seroma.  Still has pain with flexion of hip attributed to tendonitis.  Did not improve with steroids. . Still using oxycodone and methocarbamol.      3) Depression aggravated by health problems and caregiver Stress:  caring for a 52 yr old suicidal nephew (sharing his  care  with another  colleague ) parents part of the problem.  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    Lab Results  Component Value Date   HGBA1C 6.4 07/08/2023   4) screening for osteoporosis:  s/p  TAH/BSO at age 40, maternal GM has osteoporosis   histor yof non traumatic fracture of middle finger and tramautic fractures of bones of right foot.   5) history of PNA :  repeat film needed   Outpatient Medications Prior to Visit  Medication Sig Dispense Refill   albuterol (VENTOLIN HFA) 108 (90 Base) MCG/ACT inhaler Inhale 1-2 puffs into the lungs every 6 (six) hours  as needed for wheezing or shortness of breath.     ALPRAZolam (XANAX) 0.25 MG tablet Take 1-2 tablets (0.25-0.5 mg total) by mouth 2 (two) times daily as needed for anxiety. 30 tablet 5   cyanocobalamin (VITAMIN B12) 1000 MCG/ML injection Inject 1 mL (1,000 mcg total) into the muscle every 30 (thirty) days. 1 mL 11   docusate sodium (COLACE) 100 MG capsule Take 100 mg by mouth daily.     levothyroxine (SYNTHROID) 50 MCG tablet TAKE 1 TABLET BY MOUTH DAILY BEFORE BREAKFAST. 90 tablet 1   loratadine (CLARITIN) 10 MG tablet Take 10 mg by mouth daily as needed.     metFORMIN (GLUCOPHAGE) 1000 MG tablet TAKE 1 TABLET BY MOUTH TWICE A DAY WITH FOOD 60 tablet 1   methocarbamol (ROBAXIN) 500 MG tablet Take 500 mg by mouth 3 (three) times daily.     ondansetron (ZOFRAN) 4 MG tablet Take 1 tablet (4 mg total) by mouth every 6 (six) hours as needed for nausea. 20 tablet 0   oxybutynin (DITROPAN XL) 15 MG 24 hr tablet Take 1 tablet (15 mg total) by mouth at bedtime. 90 tablet 1   oxyCODONE (OXY IR/ROXICODONE) 5 MG immediate release tablet Take 0.5-1 tablets (2.5-5 mg total) by mouth every 6 (six) hours as needed for severe pain or breakthrough pain. 20 tablet 0   pantoprazole (PROTONIX) 40 MG tablet Take 1 tablet (40 mg total) by mouth daily. 90 tablet 3  QUEtiapine (SEROQUEL) 300 MG tablet Take 1 tablet (300 mg total) by mouth at bedtime. TAKE 1 TABLET BY MOUTH EVERYDAY AT BEDTIME Strength: 300 mg 90 tablet 1   sertraline (ZOLOFT) 100 MG tablet TAKE 1 TABLET BY MOUTH DAILY 90 tablet 1   Syringe/Needle, Disp, (SYRINGE 3CC/25GX1") 25G X 1" 3 ML MISC 1 Syringe by Does not apply route every 30 (thirty) days. 12 each 0   zolpidem (AMBIEN) 5 MG tablet TAKE 1 TABLET BY MOUTH AT BEDTIME AS NEEDED FOR SLEEP (Patient taking differently: Take 5 mg by mouth at bedtime as needed for sleep. for sleep) 30 tablet 5   aspirin 81 MG chewable tablet Chew 81 mg by mouth daily. (Patient not taking: Reported on 07/08/2023)      acetaminophen (TYLENOL) 500 MG tablet Take 2 tablets (1,000 mg total) by mouth every 8 (eight) hours. (Patient not taking: Reported on 07/08/2023) 30 tablet 0   traMADol (ULTRAM) 50 MG tablet Take 1 tablet (50 mg total) by mouth every 6 (six) hours as needed for moderate pain. (Patient not taking: Reported on 07/08/2023) 30 tablet 0   No facility-administered medications prior to visit.    Review of Systems;  Patient denies headache, fevers, malaise, unintentional weight loss, skin rash, eye pain, sinus congestion and sinus pain, sore throat, dysphagia,  hemoptysis , cough, dyspnea, wheezing, chest pain, palpitations, orthopnea, edema, abdominal pain, nausea, melena, diarrhea, constipation, flank pain, dysuria, hematuria, urinary  Frequency, nocturia, numbness, tingling, seizures,  Focal weakness, Loss of consciousness,  Tremor, insomnia, depression, anxiety, and suicidal ideation.      Objective:  BP 106/74   Pulse 75   Temp 98 F (36.7 C) (Oral)   Ht 5\' 4"  (1.626 m)   Wt 162 lb 9.6 oz (73.8 kg)   SpO2 95%   BMI 27.91 kg/m   BP Readings from Last 3 Encounters:  07/08/23 106/74  05/11/23 112/79  05/05/23 112/60    Wt Readings from Last 3 Encounters:  07/08/23 162 lb 9.6 oz (73.8 kg)  05/11/23 162 lb 14.4 oz (73.9 kg)  05/05/23 162 lb 6.4 oz (73.7 kg)    Physical Exam Vitals reviewed.  Constitutional:      General: She is not in acute distress.    Appearance: Normal appearance. She is normal weight. She is not ill-appearing, toxic-appearing or diaphoretic.  HENT:     Head: Normocephalic.  Eyes:     General: No scleral icterus.       Right eye: No discharge.        Left eye: No discharge.     Conjunctiva/sclera: Conjunctivae normal.  Cardiovascular:     Rate and Rhythm: Normal rate and regular rhythm.     Heart sounds: Normal heart sounds.  Pulmonary:     Effort: Pulmonary effort is normal. No respiratory distress.     Breath sounds: Normal breath sounds.   Musculoskeletal:        General: Normal range of motion.  Skin:    General: Skin is warm and dry.  Neurological:     General: No focal deficit present.     Mental Status: She is alert and oriented to person, place, and time. Mental status is at baseline.  Psychiatric:        Mood and Affect: Mood normal.        Behavior: Behavior normal.        Thought Content: Thought content normal.        Judgment: Judgment normal.  Lab Results  Component Value Date   HGBA1C 6.4 07/08/2023   HGBA1C 6.4 12/29/2022   HGBA1C 6.7 (A) 04/02/2022    Lab Results  Component Value Date   CREATININE 0.95 07/08/2023   CREATININE 1.03 05/05/2023   CREATININE 0.79 04/24/2023    Lab Results  Component Value Date   WBC 9.6 05/11/2023   HGB 12.7 05/11/2023   HCT 37.9 05/11/2023   PLT 441 (H) 05/11/2023   GLUCOSE 88 07/08/2023   CHOL 251 (H) 12/29/2022   TRIG 283.0 (H) 12/29/2022   HDL 46.90 12/29/2022   LDLDIRECT 152.0 12/29/2022   LDLCALC 106 (H) 08/02/2020   ALT 12 07/08/2023   AST 14 07/08/2023   NA 138 07/08/2023   K 4.3 07/08/2023   CL 103 07/08/2023   CREATININE 0.95 07/08/2023   BUN 17 07/08/2023   CO2 27 07/08/2023   TSH 2.24 07/08/2023   INR 0.9 04/23/2023   HGBA1C 6.4 07/08/2023   MICROALBUR 1.2 12/31/2022    CT HIP RIGHT W CONTRAST  Result Date: 04/23/2023 CLINICAL DATA:  Burning and warmth to the right hip incision from the right hip replacement, date of surgery 03/09/2023. Suspected hip replacement infection. Evaluate for CT evidence. EXAM: CT OF THE LOWER RIGHT EXTREMITY WITH CONTRAST TECHNIQUE: Multidetector CT imaging of the lower right extremity was performed according to the standard protocol following intravenous contrast administration. RADIATION DOSE REDUCTION: This exam was performed according to the departmental dose-optimization program which includes automated exposure control, adjustment of the mA and/or kV according to patient size and/or use of iterative  reconstruction technique. CONTRAST:  75mL OMNIPAQUE IOHEXOL 350 MG/ML SOLN COMPARISON:  CT pelvis and right femur 07/31/2022. FINDINGS: Bones/Joint/Cartilage The bone mineralization appears within normal limits. Right hip total joint replacement is new since 07/31/2022. There is secondary metallic spray artifact associated but no convincing loosening. There is no dislocation. No perihardware fracture is evident and no aggressive regional bone lesion is seen suspicious for particle disease or acute osteomyelitis. The bone around the acetabular component securing screws is unremarkable. There is no substantial joint effusion, allowing for extensive spray artifact at the level of the joint. There are mild spurring changes of the right SI joint, symphysis pubis, L5-S1 facet joints. Ligaments Suboptimally assessed by CT. Muscles and Tendons No acute regional tendon abnormality is seen within the limits of CT technique. There is normal muscle bulk for the patient's age. Within the upper reaches of the vastus lateralis muscle at the level of the subtrochanteric proximal femoral shaft, there is a hypodense faintly rim enhancing collection measuring 2.7 x 2.3 x 3 cm and 41 Hounsfield units, unclear if this is a viscous abscess, hematoma or complex seroma but its contents are above the usual density of fluid. At this same level there is a subcutaneous fluid collection on 7:85, measuring 1.8 x 1.4 cm with a Hounsfield density of 13. This collection may communicate with the intramuscular collection over a narrow channel on 7:85. There is a small amount of calcific debris within the larger intramuscular collection. No other intramuscular abnormality is seen. No other deep soft tissue pathologic process is suspected. Soft tissues There is stranding around the subcutaneous fluid collection described above, probably inflammatory etiology this far out from surgery. There are mildly enlarged right inguinal chain nodes most likely  reactive. Within the right hemipelvis no mass, adenopathy or fluid collections are seen. The visualized pelvic bowel is normal caliber. Uterus is absent. There is trace ascites in the posterior pelvis, nonspecific.  The visualized bladder is unremarkable. IMPRESSION: 1. 2.7 x 2.3 x 3 cm hypodense faintly rim enhancing collection in the upper vastus lateralis muscle at the level of the subtrochanteric proximal right femur, unclear if this is a viscous abscess, hematoma or complex seroma but its contents are above that of simple fluid. 2. 1.8 x 1.4 cm subcutaneous fluid collection at this same level, may communicate with the intramuscular collection over a narrow channel on 7:85. 3. Right hip total joint replacement with secondary metallic spray artifact but no convincing evidence of loosening, acute osteomyelitis or perihardware fracture. 4. Mildly enlarged right inguinal chain nodes most likely reactive. 5. Trace ascites in the posterior pelvis, nonspecific. Electronically Signed   By: Almira Bar M.D.   On: 04/23/2023 02:45   CT Angio Chest PE W/Cm &/Or Wo Cm  Result Date: 04/23/2023 CLINICAL DATA:  Pulmonary embolism suspected. Low to intermediate probability. Negative D-dimer. Hypoxemia noted today. Right total hip arthroplasty procedure performed 03/09/2023. EXAM: CT ANGIOGRAPHY CHEST WITH CONTRAST TECHNIQUE: Multidetector CT imaging of the chest was performed using the standard protocol during bolus administration of intravenous contrast. Multiplanar CT image reconstructions and MIPs were obtained to evaluate the vascular anatomy. RADIATION DOSE REDUCTION: This exam was performed according to the departmental dose-optimization program which includes automated exposure control, adjustment of the mA and/or kV according to patient size and/or use of iterative reconstruction technique. CONTRAST:  75mL OMNIPAQUE IOHEXOL 350 MG/ML SOLN COMPARISON:  Portable chest today, PA Lat chest 02/13/2023, and CTA chest  12/25/2021. FINDINGS: Cardiovascular: The cardiac size is upper limits of normal. There is no substantial pericardial effusion. The aorta and great vessels are normal. There are no visible coronary artery calcifications. The pulmonary veins are nondistended. The pulmonary trunk is upper limits of normal in caliber. No arterial embolic disease is seen. There are no findings consistent with acute right heart strain. Mediastinum/Nodes: There are bilateral mildly prominent hilar lymph nodes up to 1.1 cm in short axis on both sides. There are few similar sized subcarinal, left-sided prevascular space and right paratracheal nodes. No new or interval enlarged adenopathy is seen. There is no bulky or encasing adenopathy. Thyroid gland and axillary spaces are unremarkable. The thoracic esophagus, thoracic trachea and main bronchi are unremarkable. Lungs/Pleura: There are bilateral minimal layering pleural effusions. There is no pneumothorax. There are posterior basal opacities in both lower lobes which could represent atelectasis or pneumonia, slightly greater on the right. Bronchial thickening is noted in both lower lobes. There is mild posterior atelectasis in the upper lobes. The lungs are otherwise clear.  No lung nodules are seen. Upper Abdomen: No acute abnormality. Status post cholecystectomy. Mild hepatic steatosis. Chronic elevated right hemidiaphragm. Musculoskeletal: There are degenerative changes of the thoracic spine. No acute osseous abnormality or aggressive lesions. No chest wall mass is seen. Review of the MIP images confirms the above findings. IMPRESSION: 1. Upper limit of normal pulmonary trunk caliber with no evidence of arterial emboli. 2. Borderline cardiomegaly.  No venous dilatation or edema. 3. Bilateral lower lobe posterior basal opacities which could represent atelectasis or pneumonia, with bronchial thickening in both lower lobes. 4. Mildly prominent hilar lymph nodes, but unchanged. 5. Hepatic  steatosis. Electronically Signed   By: Almira Bar M.D.   On: 04/23/2023 02:17   CT Head Wo Contrast  Result Date: 04/23/2023 CLINICAL DATA:  Severe headache with mental status changes. EXAM: CT HEAD WITHOUT CONTRAST TECHNIQUE: Contiguous axial images were obtained from the base of the skull through the  vertex without intravenous contrast. RADIATION DOSE REDUCTION: This exam was performed according to the departmental dose-optimization program which includes automated exposure control, adjustment of the mA and/or kV according to patient size and/or use of iterative reconstruction technique. COMPARISON:  None Available. FINDINGS: Brain: No evidence of acute infarction, hemorrhage, hydrocephalus, extra-axial collection or mass lesion/mass effect. Vascular: There are scattered calcifications in the carotid siphons. No hyperdense central vessel is seen. Skull: Negative for fractures or focal lesions. Sinuses/Orbits: No acute finding. Other: None. IMPRESSION: No acute intracranial CT findings. Atherosclerosis. Electronically Signed   By: Almira Bar M.D.   On: 04/23/2023 00:07   DG Chest Port 1 View  Result Date: 04/22/2023 CLINICAL DATA:  Questionable sepsis. Check for infiltrates or other abnormality. Headache and neck pain also. EXAM: PORTABLE CHEST 1 VIEW COMPARISON:  PA and lateral study 02/13/2023 FINDINGS: The heart size and mediastinal contours are within normal limits. Both lungs are clear. The visualized skeletal structures are unremarkable. IMPRESSION: No evidence of acute chest disease.  Stable chest. Electronically Signed   By: Almira Bar M.D.   On: 04/22/2023 23:11    Assessment & Plan:  .Vitamin D deficiency -     VITAMIN D 25 Hydroxy (Vit-D Deficiency, Fractures)  Long-term use of high-risk medication  Hypothyroidism, unspecified type -     TSH  Controlled type 2 diabetes mellitus with complication, without long-term current use of insulin (HCC) -     Hemoglobin A1c -      Comprehensive metabolic panel -     Lipid Panel w/reflex Direct LDL  Iron deficiency -     Iron, TIBC and Ferritin Panel  Osteopenia after menopause -     DG Bone Density; Future  Community acquired bilateral lower lobe pneumonia Assessment & Plan: All cultures were negative.  Improved clinically with empiric use of antibiotics.  Repeat film to be done today    Need for influenza vaccination -     Flu vaccine trivalent PF, 6mos and older(Flulaval,Afluria,Fluarix,Fluzone)  Type 2 diabetes mellitus without complication, without long-term current use of insulin (HCC) Assessment & Plan: -controlled with metformin only .Marland Kitchen Repeat A1c is due next month  Lab Results  Component Value Date   HGBA1C 6.4 07/08/2023      Restless legs syndrome Assessment & Plan: Starting requip,  rechecking iron stores    Other orders -     rOPINIRole HCl; Take 1 tablet (0.25 mg total) by mouth 3 (three) times daily.  Dispense: 90 tablet; Refill: 1     I provided 30 minutes of face-to-face time during this encounter reviewing patient's last visit with me, patient's  most recent visit with pulmonology,  orthopedics ,  previous surgical and non surgical procedures, previous  labs and imaging studies, counseling on currently addressed issues,  and post visit ordering to diagnostics and therapeutics .   Follow-up: Return in about 3 months (around 10/07/2023) for follow up diabetes.   Sherlene Shams, MD

## 2023-07-08 NOTE — Assessment & Plan Note (Signed)
-  controlled with metformin only .Marland Kitchen Repeat A1c is due next month  Lab Results  Component Value Date   HGBA1C 6.4 07/08/2023

## 2023-07-08 NOTE — Assessment & Plan Note (Addendum)
All cultures were negative.  Improved clinically with empiric use of antibiotics.  Repeat film to be done today

## 2023-07-08 NOTE — Patient Instructions (Signed)
Start the REquip one hour before bedtime  You may add an after dinner dose if needed   The dose can be gradually increased as needed to manage restless legs

## 2023-07-09 LAB — IRON,TIBC AND FERRITIN PANEL
%SAT: 33 % (ref 16–45)
Ferritin: 32 ng/mL (ref 16–232)
Iron: 110 ug/dL (ref 45–160)
TIBC: 332 ug/dL (ref 250–450)

## 2023-07-09 LAB — LIPID PANEL W/REFLEX DIRECT LDL
Cholesterol: 255 mg/dL — ABNORMAL HIGH (ref <200)
HDL: 56 mg/dL (ref >=50)
LDL Cholesterol (Calc): 167 mg/dL — ABNORMAL HIGH
Non-HDL Cholesterol (Calc): 199 mg/dL — ABNORMAL HIGH (ref <130)
Total CHOL/HDL Ratio: 4.6 (calc) (ref <5.0)
Triglycerides: 171 mg/dL — ABNORMAL HIGH (ref <150)

## 2023-07-10 ENCOUNTER — Encounter: Payer: Self-pay | Admitting: Internal Medicine

## 2023-07-12 ENCOUNTER — Other Ambulatory Visit: Payer: Self-pay | Admitting: Internal Medicine

## 2023-07-12 DIAGNOSIS — F319 Bipolar disorder, unspecified: Secondary | ICD-10-CM

## 2023-07-12 DIAGNOSIS — G4700 Insomnia, unspecified: Secondary | ICD-10-CM

## 2023-07-12 MED ORDER — ATORVASTATIN CALCIUM 20 MG PO TABS: 20.0000 mg | ORAL_TABLET | Freq: Every day | ORAL | 3 refills | Status: DC

## 2023-07-28 ENCOUNTER — Other Ambulatory Visit: Payer: Self-pay | Admitting: Internal Medicine

## 2023-07-28 ENCOUNTER — Ambulatory Visit: Payer: 59 | Admitting: Internal Medicine

## 2023-08-01 ENCOUNTER — Other Ambulatory Visit: Payer: Self-pay | Admitting: Internal Medicine

## 2023-08-01 DIAGNOSIS — G4700 Insomnia, unspecified: Secondary | ICD-10-CM

## 2023-08-01 DIAGNOSIS — F319 Bipolar disorder, unspecified: Secondary | ICD-10-CM

## 2023-08-03 ENCOUNTER — Other Ambulatory Visit: Payer: Self-pay | Admitting: Internal Medicine

## 2023-08-03 DIAGNOSIS — M65332 Trigger finger, left middle finger: Secondary | ICD-10-CM | POA: Diagnosis not present

## 2023-08-06 ENCOUNTER — Ambulatory Visit: Payer: 59 | Admitting: Neurosurgery

## 2023-08-19 DIAGNOSIS — M65332 Trigger finger, left middle finger: Secondary | ICD-10-CM | POA: Diagnosis not present

## 2023-08-20 ENCOUNTER — Ambulatory Visit (INDEPENDENT_AMBULATORY_CARE_PROVIDER_SITE_OTHER): Payer: 59 | Admitting: Neurosurgery

## 2023-08-20 ENCOUNTER — Encounter: Payer: Self-pay | Admitting: Neurosurgery

## 2023-08-20 ENCOUNTER — Ambulatory Visit: Payer: 59 | Admitting: Neurosurgery

## 2023-08-20 VITALS — BP 112/62 | Ht 63.0 in | Wt 162.0 lb

## 2023-08-20 DIAGNOSIS — M5417 Radiculopathy, lumbosacral region: Secondary | ICD-10-CM | POA: Diagnosis not present

## 2023-08-20 DIAGNOSIS — M542 Cervicalgia: Secondary | ICD-10-CM

## 2023-08-20 DIAGNOSIS — R2689 Other abnormalities of gait and mobility: Secondary | ICD-10-CM | POA: Diagnosis not present

## 2023-08-20 DIAGNOSIS — M5412 Radiculopathy, cervical region: Secondary | ICD-10-CM | POA: Diagnosis not present

## 2023-08-20 DIAGNOSIS — R519 Headache, unspecified: Secondary | ICD-10-CM | POA: Diagnosis not present

## 2023-08-20 DIAGNOSIS — M5114 Intervertebral disc disorders with radiculopathy, thoracic region: Secondary | ICD-10-CM | POA: Diagnosis not present

## 2023-08-20 DIAGNOSIS — M4714 Other spondylosis with myelopathy, thoracic region: Secondary | ICD-10-CM

## 2023-08-20 DIAGNOSIS — H538 Other visual disturbances: Secondary | ICD-10-CM | POA: Diagnosis not present

## 2023-08-20 NOTE — Progress Notes (Signed)
Follow-up note: Referring Physician:  Sherlene Shams, MD [address] [place],  [place] [address]  Primary Physician:  Sherlene Shams, MD  Chief Complaint: muscle spasms in her leg leg, problems with gait and balance, neck pain, thoracic back pain, and headaches  History of Present Illness: Kathleen Carr is a 52 y.o. female with a past medical history of vitamin D deficiency, hypothyroidism, type 2 diabetes, iron deficiency, osteopenia, restless leg syndrome, recent hospital admission for pneumonia who presents with multiple complaints and concerns today. She has a chronic history of low back pain and right radiating leg pain which are largely unchanged since her last visit.  She underwent a right hip replacement with Dr. Audelia Acton in May of this year.and is still having some pain in her right groin that she was told is related to a tendon. She states she was supposed to follow-up with Dr. Cherylann Ratel for spinal cord stimulator evaluation however this is around the time that she had her hip replaced and ultimately had a subsequent hospitalization for pneumonia and given her other symptoms, deferred follow-up for this. She however has multiple new symptoms since her last neurosurgical visit at the beginning of the year which are outlined below.  Headaches: Kathleen Carr complains of headaches since the early or late spring of this year without any particular inciting event.  She denies any trauma.  She initially thought they were related to allergies however she takes allergy medication and there is been no change in their frequency or severity since onset.  She describes a dull achy pain behind her left eye with associated photophobia.  Aside from light, she denies any exacerbating or relieving factors.  It does not seem to currently correlate with other symptoms although she does intermittently have blurred vision in her left eye with her headaches.  She also has intermittent blurry vision  that is not associated with headaches.  Blurry vision: She states that this has been going on since about June of this year without any particular inciting event.  She does wear corrective lenses at baseline and had her prescription changed in January of this year however she has had intermittent blurry vision that is worse with trying to focus and seems to be worse in the left eye particularly on the half closest to her nose.  While she does sometimes have blurred vision with her headaches, she also has blurry vision without her headaches.  She denies any persistent or consistent additional symptoms associated with blurred vision.  Episodes of imbalance: [name] reports intermittent episodes of imbalance worse over the last 2 or 3 months.  They seem to be worse when she goes to turn and she feels as though she is going to fall.  She denies any associated dizziness.  She does not feel this is related to a mechanical problem.  Neck pain: She reports several months of ongoing neck pain that is worse with turning her head to the right and looking down.  She does have associated numbness and tingling down the length of her right arm and all 5 fingers.  She feels as though her arm is going to fall asleep.  This does improve when she holds her arm above her head.  She denies any similar symptoms on the left.  Thoracic pain: She describes about 1 year of thoracic back pain around her bra line that is worse over the last 6 months.  She states it feels as though something is  catching.  It is worse with arm movements and seems to be largely in the middle of her back.  While heat does provide some minimal relief, her symptoms return when she is no longer applying heat.  She is currently taking Robaxin but this does not seem to help the symptoms.  She denies any radiating thoracic pain.  Intermittent nausea and vomiting: Kathleen Carr recalls multiple episodes of sudden nausea over the last 2 months as well as  episodes of vomiting.  Not all episodes of vomiting are preceded by nausea however most seem to be.  She denies any correlation with meals or additional symptoms.  She is not typically experiencing severe headaches or an increase in pain when these episodes occur.  Left leg symptoms: She reports symptoms of left leg spasms that occur in her anterior left lower leg and left thigh intermittently starting in early spring of this year as well as episodes as if her foot is dragging.  This does not seem to correlate with worsening back pain or radiating left leg symptoms but has resulted in near falls.  She feels as though her leg is tightening up and it is hard for her to get her leg to straighten out.    11/26/2022 Kathleen Carr is here today with a chief complaint of low back and right buttock and groin pain.  She also has pain on her anterior thigh and in her right lateral calf.  She has been having this pain for several years.  She underwent a left-sided L4-5 microdiscectomy in 2021 for left leg pain.  That pain has improved, but her back pain is worsened over time.  Prolonged standing, walking, and sitting make it worse.  Changing positions helps temporarily.  Medications have helped a small amount.   She had a very bad reaction to an injection during her previous bout with sciatica, so is not interested in considering further injections.   Bowel/Bladder Dysfunction: none   Conservative measures:  Physical therapy:  has not participated for her back; has participated in for her right hip from 08/25/22 to 10/30/22, but it was making her back pain worse. Multimodal medical therapy including regular antiinflammatories:  tylenol, ibuprofen, mobic, tramadol, and flexeril, methocarbamol  Injections:  has received epidural steroid injections 01/09/21: Right L4-5 and L5-S1 facet joint injection    Past Surgery: Left L4/5 discectomy in 2021 with Dr. Marsa Aris has no symptoms of cervical  myelopathy.   The symptoms are causing a significant impact on the patient's life.    I have utilized the care everywhere function in epic to review the outside records available from external health systems.   Telephone visit with [name], PA-C on 11/13/2022: I spoke with Mrs. Sanville today regarding pain back and leg pain.  She has been in physical therapy for her hip but is having increased pain and is not able to participate with this.  She would like to move forward with surgical consideration.  I did encourage her to contact the physical therapist and see if they are willing to formally discharge her from PT given her increased pain and inability to participate.  She will do that today.  I have also reviewed her imaging and symptoms with [name] who felt that a discussion of possible surgical intervention was reasonable.  Will get her scheduled to see [name] to discuss her options.  She was encouraged to call the office should she have any questions or  concerns.   Office visit with [name], PA-C on 10/23/2022: Novice MAZAK is a 52 year old presenting today for further evaluation of persistent back pain.  She has since undergone her right hip surgery which has provided significant relief to her right groin pain however despite this she has persistent low back pain that is debilitating and unchanged in nature.  In addition to this she describes some discomfort in her left lateral leg.  She denies any other radiating leg symptoms.  She would like further evaluation and treatment of this.    Office visit with Drake Leach, PA-C on 07/22/2022: History of Left L5-S1 lumbar laminectomy in 2021 with [name].    History of bipolar, DM, GERD, HTN, and PTSD.    Last seen by Neamiah Sciarra on 03/20/22 for LBP with right groin and anterior thigh pain.    MRI 02/21/22 showed multilevel degenerative changes the lumbar spine, worst at L4-L5 where there is asymmetric left disc bulging and facet  arthropathy resulting in severe left-sided neural foraminal stenosis and exiting nerve root impingement. Mild spinal canal and moderate right neural foraminal stenosis at this level.    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 Discussed lumbar ESI and PT, she wanted to hold off on these.    She is here for follow up.    She saw ortho and is scheduled for right hip surgery in October (labral tear and bone spurs per patient).    She has constant right > left sided LBP with some pulling in front right thigh and left posterior thigh. Leg pain is intermittent. Pain is worse with prolonged standing and walking. She has pain with carrying laundry basket. Some relief relief with changing position. No numbness or tingling. No weakness noted. No bowel or bladder issues.    She is on flexeril and ultram. She has minimal improvement with these. No recent lumbar ESIs (had prior to surgery in 2021). No recent PT for her lower back.    Office visit with [name], PA-C on 02/04/2022: Ms. Daziyah Huy is a 52 y.o with a history of diabetes (last A1c 7.2), HLD, and previous lumbar laminectomy who is here today with a chief complaint of low back pain and right radiating leg pain. She states this is been going on for about 2 years that any particular inciting event. She states that it feels like a crunching in her back when sitting upright and pulling radiating pain into her right groin and anterior thigh without extension below the knee. Her symptoms are worse with sitting for prolonged periods of time or walking for greater than 10 to 15 minutes and improves with laying flat on her back with her knees up. She states that this feels similar to the pain that she was having prior to her lumbar laminectomy in 2021. She did initially go back to [name] after onset of the symptoms but was told there is nothing they can do. She states that she has not had any  recent MRI. She did undergo a couple weeks of physical therapy but was unable to complete 6 weeks due to pain. She denies any similar left-sided symptoms. Of note she admits to about 1/2 pack per day of smoking  Review of Systems:  A 10 point review of systems is negative,  and the pertinent positives and negatives detailed in the HPI.  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   Pneumonia    PTSD (post-traumatic stress disorder)    Smoker    Thyroid disease     Past Surgical History: Past Surgical History:  Procedure Laterality Date   ABDOMINAL HYSTERECTOMY  12/03/2011   complete   ADENOIDECTOMY, TONSILLECTOMY AND MYRINGOTOMY WITH TUBE PLACEMENT  1977   CARPAL TUNNEL RELEASE Right 03/12/2022   Procedure: RIGHT CARPAL TUNNEL RELEASE;  Surgeon: Marlyne Beards, MD;  Location: [facility];  Service: Orthopedics;  Laterality: Right;   CARPAL TUNNEL RELEASE Left 04/16/2022   Procedure: LEFT CARPAL TUNNEL RELEASE;  Surgeon: Marlyne Beards, MD;  Location: [facility];  Service: Orthopedics;  Laterality: Left;   CHOLECYSTECTOMY  06/08/2011   COLONOSCOPY WITH PROPOFOL N/A 02/05/2022   Procedure: COLONOSCOPY WITH PROPOFOL;  Surgeon: Wyline Mood, MD;  Location: [facility];  Service: Gastroenterology;  Laterality: N/A;   ESOPHAGOGASTRODUODENOSCOPY N/A 02/05/2022   Procedure: ESOPHAGOGASTRODUODENOSCOPY (EGD);  Surgeon: Wyline Mood, MD;  Location: [facility];  Service: Gastroenterology;  Laterality: N/A;   HIP ARTHROSCOPY Right 08/19/2022   Procedure: Right hip arthroscopy, acetabuloplasty, labral  repair, femoral osteochondroplasty, capsular closure;  Surgeon: Signa Kell, MD;  Location: [facility];  Service: Orthopedics;  Laterality: Right;   KNEE ARTHROSCOPY Right 1993   KNEE ARTHROSCOPY Left 1998   LUMBAR LAMINECTOMY Left 2021   TOTAL HIP ARTHROPLASTY Right 03/09/2023   Procedure: TOTAL HIP ARTHROPLASTY ANTERIOR APPROACH;  Surgeon: Reinaldo Berber, MD;  Location: [facility];  Service: Orthopedics;  Laterality: Right;    Allergies: Allergies as of 08/20/2023 - Review Complete 07/12/2023  Allergen Reaction Noted   Dust mite mixed allergen ext [mite (d. farinae)]  08/01/2020   Nsaids  12/31/2022   Other Hives 08/01/2020   Sulfa antibiotics Hives 06/24/2020   Latex Itching 08/12/2022   Misc. sulfonamide containing compounds Hives 05/05/2022    Medications: Outpatient Encounter Medications as of 08/20/2023  Medication Sig   albuterol (VENTOLIN HFA) 108 (90 Base) MCG/ACT inhaler Inhale 1-2 puffs into the lungs every 6 (six) hours as needed for wheezing or shortness of breath.   ALPRAZolam (XANAX) 0.25 MG tablet Take 1-2 tablets (0.25-0.5 mg total) by mouth 2 (two) times daily as needed for anxiety.   aspirin 81 MG chewable tablet Chew 81 mg by mouth daily. (Patient not taking: Reported on 07/08/2023)   atorvastatin (LIPITOR) 20 MG tablet Take 1 tablet (20 mg total) by mouth daily.   cyanocobalamin (VITAMIN B12) 1000 MCG/ML injection Inject 1 mL (1,000 mcg total) into the muscle every 30 (thirty) days.   docusate sodium (COLACE) 100 MG capsule Take 100 mg by mouth daily.   levothyroxine (SYNTHROID) 50 MCG tablet TAKE 1 TABLET BY MOUTH DAILY BEFORE BREAKFAST.   loratadine (CLARITIN) 10 MG tablet Take 10 mg by mouth daily as needed.   metFORMIN (GLUCOPHAGE) 1000 MG tablet TAKE 1 TABLET BY MOUTH TWICE A DAY WITH FOOD   methocarbamol (ROBAXIN) 500 MG tablet Take 500 mg by mouth 3 (three) times daily.   ondansetron (ZOFRAN) 4 MG tablet Take 1 tablet (4 mg total) by mouth every 6 (six) hours  as needed for nausea.  oxybutynin (DITROPAN XL) 15 MG 24 hr tablet Take 1 tablet (15 mg total) by mouth at bedtime.   oxyCODONE (OXY IR/ROXICODONE) 5 MG immediate release tablet Take 0.5-1 tablets (2.5-5 mg total) by mouth every 6 (six) hours as needed for severe pain or breakthrough pain.   pantoprazole (PROTONIX) 40 MG tablet Take 1 tablet (40 mg total) by mouth daily.   QUEtiapine (SEROQUEL) 300 MG tablet TAKE 1 TABLET BY MOUTH EVERYDAY AT BEDTIME   rOPINIRole (REQUIP) 0.25 MG tablet TAKE 1 TABLET BY MOUTH 3 TIMES DAILY.   sertraline (ZOLOFT) 100 MG tablet TAKE 1 TABLET BY MOUTH DAILY   Syringe/Needle, Disp, (SYRINGE 3CC/25GX1") 25G X 1" 3 ML MISC 1 Syringe by Does not apply route every 30 (thirty) days.   zolpidem (AMBIEN) 5 MG tablet TAKE 1 TABLET BY MOUTH EVERY DAY AT BEDTIME AS NEEDED FOR SLEEP   [DISCONTINUED] simvastatin (ZOCOR) 40 MG tablet Take 1 tablet (40 mg total) by mouth daily at 6 PM.   [DISCONTINUED] sitaGLIPtin (JANUVIA) 100 MG tablet Take 1 tablet (100 mg total) by mouth daily.   No facility-administered encounter medications on file as of 08/20/2023.    Social History: Social History   Tobacco Use   Smoking status: Former    Current packs/day: 0.00    Average packs/day: 0.5 packs/day for 42.0 years (21.0 ttl pk-yrs)    Types: Cigarettes    Start date: 01/25/1981    Quit date: 01/26/2023    Years since quitting: 0.5   Smokeless tobacco: Never  Vaping Use   Vaping status: Former   Substances: Nicotine  Substance Use Topics   Alcohol use: Not Currently   Drug use: Not Currently    Family Medical History: Family History  Problem Relation Age of Onset   Hypertension Mother    Thyroid disease Mother    Lung cancer Father 74       carcinoid, right lung   Ulcerative colitis Father    Renal Disease Maternal Grandfather    Diabetes Maternal Grandfather        Type2   Glaucoma Maternal Grandmother    Stroke Paternal Grandfather    Hypertension Paternal  Grandfather     Exam: Today's Vitals   08/20/23 1330  BP: 112/62  Weight: 73.5 kg  Height: 5\' 3"  (1.6 m)  PainSc: 6   PainLoc: Back   Body mass index is 28.7 kg/m.  Awake, alert, oriented to person, place, and time.  Speech is clear and fluent. Fund of knowledge is appropriate.    Cranial Nerves: Pupils equal round and reactive to light.  Facial tone is symmetric.  Facial sensation is symmetric.   Strength: Side Biceps Triceps Deltoid Interossei Grip Wrist Ext. Wrist Flex.  R 5 5 5 5 5 5 5   L 5 5 5  - - - -  Distal LUE untested due to recent trigger finger surgery   Side Iliopsoas Quads Hamstring PF DF EHL  R 5 5 5 5 5 5   L 4 4 4  4- 4- 4    Reflexes are 1+ and symmetric at the biceps, triceps, brachioradialis, patella and achilles.   Hoffman's is absent.  Clonus is not present.     Can stand on tip toes but is unable to stand on her heels.   Imaging: 04/22/23 CT head IMPRESSION: No acute intracranial CT findings. Atherosclerosis.   Electronically Signed   By: Almira Bar M.D.   On: 04/23/2023 00:07  02/21/23 MRI T spine IMPRESSION: 1.  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 Only a small amount of subarachnoid spaces present dorsal to the cord. 2. Shallow disc herniations at T3-4, T4-5, T5-6, T6-7, T8-9 and T9-10. Narrowing of the ventral subarachnoid space but no compression of the cord at those levels. 3. Hypertrophic degenerative change of the right facet at T10-11 with some narrowing of the dorsal subarachnoid space on the right at that level.     Electronically Signed   By: Paulina Fusi M.D.   On: 02/25/2023 14:09  I have personally reviewed the images and agree with the above interpretation.  Assessment and Plan: Ms. Rockwood is a pleasant 52 y.o. female with a multitude of neurologic symptoms.  Her constellation of headaches, blurred vision, neck pain, right rating arm symptoms,  thoracic back pain, low back pain, muscle spasms, gait disturbance, and focal weakness are concerning for an underlying neurological cause.  Given her new onset of headaches and changes in vision as well as imbalance, I would like to obtain an MRI of her brain for further evaluation.  I also suggested a cervical MRI given her neck pain and right radiating arm symptoms.  This would also help hopefully rule out cervical myelopathy.  Given her thoracic MRI from April showing large disc herniations at T7 and T8 with some cord indention, and her worsening thoracic back pain as well as imbalance, I would like to get an updated thoracic MRI to evaluate for any changes to this that may explain the symptoms.  Given her new left lower extremity symptoms and weakness with her known history of lumbosacral disease, I recommended an updated lumbar MRI. I did express to her that I do not have a good explanation from a neurosurgical standpoint for her nausea and vomiting as they are not correlated with her headaches or other symptoms.  I recommended that she discuss this further with her primary care provider for further evaluation and treatment. I will see her back in person after completion of her MRIs and recommended an extended visit to review these.  Should these be largely without explanation, I recommended referral to neurology for further evaluation and possible referral back to Dr. Cherylann Ratel to discuss SCS trial. She was encouraged to call the office in the interim should she have any questions or concerns.  She expressed understanding and was in agreement with this plan.  I spent a total of 60 minutes in both face-to-face and non-face-to-face activities for this visit on the date of this encounter including review of previous records, review of recent imaging, lengthy discussion regarding symptoms, physical exam, discussion of differential diagnosis, order placement, and documentation  [name] PA-C Neurosurgery

## 2023-08-22 LAB — HM DIABETES EYE EXAM

## 2023-08-25 ENCOUNTER — Other Ambulatory Visit: Payer: Self-pay | Admitting: Internal Medicine

## 2023-08-25 DIAGNOSIS — G4700 Insomnia, unspecified: Secondary | ICD-10-CM

## 2023-08-25 DIAGNOSIS — F319 Bipolar disorder, unspecified: Secondary | ICD-10-CM

## 2023-09-01 ENCOUNTER — Ambulatory Visit
Admission: RE | Admit: 2023-09-01 | Discharge: 2023-09-01 | Disposition: A | Discharge status: Home / Self Care | Payer: 59 | Source: Ambulatory Visit | Attending: Neurosurgery | Admitting: Neurosurgery

## 2023-09-01 DIAGNOSIS — M5124 Other intervertebral disc displacement, thoracic region: Secondary | ICD-10-CM | POA: Diagnosis not present

## 2023-09-01 DIAGNOSIS — M48061 Spinal stenosis, lumbar region without neurogenic claudication: Secondary | ICD-10-CM | POA: Diagnosis not present

## 2023-09-01 DIAGNOSIS — M4804 Spinal stenosis, thoracic region: Secondary | ICD-10-CM | POA: Diagnosis not present

## 2023-09-01 DIAGNOSIS — M50223 Other cervical disc displacement at C6-C7 level: Secondary | ICD-10-CM | POA: Diagnosis not present

## 2023-09-01 DIAGNOSIS — M4802 Spinal stenosis, cervical region: Secondary | ICD-10-CM | POA: Diagnosis not present

## 2023-09-01 DIAGNOSIS — M4714 Other spondylosis with myelopathy, thoracic region: Secondary | ICD-10-CM | POA: Insufficient documentation

## 2023-09-01 DIAGNOSIS — M5417 Radiculopathy, lumbosacral region: Secondary | ICD-10-CM

## 2023-09-01 DIAGNOSIS — G8929 Other chronic pain: Secondary | ICD-10-CM | POA: Diagnosis not present

## 2023-09-01 DIAGNOSIS — M5412 Radiculopathy, cervical region: Secondary | ICD-10-CM

## 2023-09-01 DIAGNOSIS — M47816 Spondylosis without myelopathy or radiculopathy, lumbar region: Secondary | ICD-10-CM | POA: Diagnosis not present

## 2023-09-01 DIAGNOSIS — R519 Headache, unspecified: Secondary | ICD-10-CM | POA: Insufficient documentation

## 2023-09-01 DIAGNOSIS — M5126 Other intervertebral disc displacement, lumbar region: Secondary | ICD-10-CM | POA: Diagnosis not present

## 2023-09-01 DIAGNOSIS — M5136 Other intervertebral disc degeneration, lumbar region with discogenic back pain only: Secondary | ICD-10-CM | POA: Diagnosis not present

## 2023-09-01 DIAGNOSIS — M47814 Spondylosis without myelopathy or radiculopathy, thoracic region: Secondary | ICD-10-CM | POA: Diagnosis not present

## 2023-09-01 DIAGNOSIS — M47812 Spondylosis without myelopathy or radiculopathy, cervical region: Secondary | ICD-10-CM | POA: Diagnosis not present

## 2023-09-18 ENCOUNTER — Ambulatory Visit (INDEPENDENT_AMBULATORY_CARE_PROVIDER_SITE_OTHER): Payer: 59 | Admitting: Internal Medicine

## 2023-09-18 ENCOUNTER — Encounter: Payer: Self-pay | Admitting: Internal Medicine

## 2023-09-18 VITALS — BP 112/68 | HR 75 | Ht 63.0 in | Wt 167.4 lb

## 2023-09-18 DIAGNOSIS — E119 Type 2 diabetes mellitus without complications: Secondary | ICD-10-CM

## 2023-09-18 DIAGNOSIS — M25551 Pain in right hip: Secondary | ICD-10-CM | POA: Diagnosis not present

## 2023-09-18 DIAGNOSIS — G4700 Insomnia, unspecified: Secondary | ICD-10-CM | POA: Diagnosis not present

## 2023-09-18 DIAGNOSIS — Z96641 Presence of right artificial hip joint: Secondary | ICD-10-CM

## 2023-09-18 DIAGNOSIS — G8929 Other chronic pain: Secondary | ICD-10-CM | POA: Diagnosis not present

## 2023-09-18 DIAGNOSIS — M545 Low back pain, unspecified: Secondary | ICD-10-CM

## 2023-09-18 DIAGNOSIS — G2581 Restless legs syndrome: Secondary | ICD-10-CM | POA: Diagnosis not present

## 2023-09-18 DIAGNOSIS — F418 Other specified anxiety disorders: Secondary | ICD-10-CM

## 2023-09-18 DIAGNOSIS — E039 Hypothyroidism, unspecified: Secondary | ICD-10-CM

## 2023-09-18 DIAGNOSIS — Z7984 Long term (current) use of oral hypoglycemic drugs: Secondary | ICD-10-CM | POA: Diagnosis not present

## 2023-09-18 DIAGNOSIS — Z87448 Personal history of other diseases of urinary system: Secondary | ICD-10-CM | POA: Diagnosis not present

## 2023-09-18 NOTE — Assessment & Plan Note (Addendum)
Startied requip at last visit in Sept after rechecking iron stores which were normal . She is currently taking 0.5 mg at 8 pm but symptoms at bedtime have escalated  to a feeling that the left side of her body is restless.  Will try increasing the dose to 0.75 mg at 8 pm and 0.25 mg at bedtime   Lab Results  Component Value Date   IRON 110 07/08/2023   TIBC 332 07/08/2023   FERRITIN 32 07/08/2023

## 2023-09-18 NOTE — Assessment & Plan Note (Signed)
Thyroid function is WNL on current dose.  No current changes needed.   Lab Results  Component Value Date   TSH 2.24 07/08/2023

## 2023-09-18 NOTE — Patient Instructions (Addendum)
Increase your dose of  requip to 0.75 mg daily at 8 pm ,  and take another 0.25 mg (1 tablet) at bedtime    We may cautiously restart meloxicam if your your urine test is negative for protein

## 2023-09-18 NOTE — Progress Notes (Unsigned)
Subjective:  Patient ID: Kathleen Carr, female    DOB: [birth year]  Age: 52 y.o. MRN: [id number]  CC: {There were no encounter diagnoses. (Refresh or delete this SmartLink)}   HPI Kathleen Carr presents for  Chief Complaint  Patient presents with   Medical Management of Chronic Issues    6 month follow up    1) type 2 DM last A1c   6.4 in Sept .  Taking metformin  only.     Lab Results  Component Value Date   MICROALBUR 1.2 12/31/2022   MICROALBUR 3.3 (H) 06/27/2022       2) RLS  getting worse,  now the left side of her entire body feels the urge to get up . Has increased the  requip to 0.5 mg several weeks ago.    3) headaches;  seeing [name] at [facility].  MRIs brain and spine were  done NOv 5,  has not heard   4) still using oxycodone for continued hip pain . Has not followed up with orthopedics yet. Anterior proximal thigh feels enflamed all the time hurts to flex hip while sitting  . Tramadol did not help.   Seeing Pain management told to stop tylenol ,  tramadol did not help    5) insomnia: using ambien 5  6) GAD:  using sertraline and seroquel,  alprazolam prn anxiety not at night   Outpatient Medications Prior to Visit  Medication Sig Dispense Refill   albuterol (VENTOLIN HFA) 108 (90 Base) MCG/ACT inhaler Inhale 1-2 puffs into the lungs every 6 (six) hours as needed for wheezing or shortness of breath.     ALPRAZolam (XANAX) 0.25 MG tablet Take 1-2 tablets (0.25-0.5 mg total) by mouth 2 (two) times daily as needed for anxiety. 30 tablet 5   aspirin 81 MG chewable tablet Chew 81 mg by mouth daily.     atorvastatin (LIPITOR) 20 MG tablet Take 1 tablet (20 mg total) by mouth daily. 90 tablet 3   cyanocobalamin (VITAMIN B12) 1000 MCG/ML injection Inject 1 mL (1,000 mcg total) into the muscle every 30 (thirty) days. 1 mL 11   docusate sodium (COLACE) 100 MG capsule Take 100 mg by mouth daily.     levothyroxine (SYNTHROID) 50 MCG tablet TAKE 1 TABLET BY MOUTH  DAILY BEFORE BREAKFAST. 90 tablet 1   loratadine (CLARITIN) 10 MG tablet Take 10 mg by mouth daily as needed.     metFORMIN (GLUCOPHAGE) 1000 MG tablet TAKE 1 TABLET BY MOUTH TWICE A DAY WITH FOOD 180 tablet 1   methocarbamol (ROBAXIN) 500 MG tablet Take 500 mg by mouth 3 (three) times daily.     ondansetron (ZOFRAN) 4 MG tablet Take 1 tablet (4 mg total) by mouth every 6 (six) hours as needed for nausea. 20 tablet 0   oxybutynin (DITROPAN XL) 15 MG 24 hr tablet Take 1 tablet (15 mg total) by mouth at bedtime. 90 tablet 1   oxyCODONE (OXY IR/ROXICODONE) 5 MG immediate release tablet Take 0.5-1 tablets (2.5-5 mg total) by mouth every 6 (six) hours as needed for severe pain or breakthrough pain. 20 tablet 0   pantoprazole (PROTONIX) 40 MG tablet Take 1 tablet (40 mg total) by mouth daily. 90 tablet 3   QUEtiapine (SEROQUEL) 300 MG tablet TAKE 1 TABLET BY MOUTH EVERYDAY AT BEDTIME 30 tablet 5   rOPINIRole (REQUIP) 0.25 MG tablet TAKE 1 TABLET BY MOUTH 3 TIMES DAILY. 270 tablet 1   sertraline (ZOLOFT) 100 MG  tablet TAKE 1 TABLET BY MOUTH EVERY DAY 90 tablet 2   Syringe/Needle, Disp, (SYRINGE 3CC/25GX1") 25G X 1" 3 ML MISC 1 Syringe by Does not apply route every 30 (thirty) days. 12 each 0   zolpidem (AMBIEN) 5 MG tablet TAKE 1 TABLET BY MOUTH EVERY DAY AT BEDTIME AS NEEDED FOR SLEEP 30 tablet 5   No facility-administered medications prior to visit.    Review of Systems;  Patient denies headache, fevers, malaise, unintentional weight loss, skin rash, eye pain, sinus congestion and sinus pain, sore throat, dysphagia,  hemoptysis , cough, dyspnea, wheezing, chest pain, palpitations, orthopnea, edema, abdominal pain, nausea, melena, diarrhea, constipation, flank pain, dysuria, hematuria, urinary  Frequency, nocturia, numbness, tingling, seizures,  Focal weakness, Loss of consciousness,  Tremor, insomnia, depression, anxiety, and suicidal ideation.      Objective:  BP 112/68   Pulse 75   Ht 5\' 3"   (1.6 m)   Wt 167 lb 6.4 oz (75.9 kg)   SpO2 96%   BMI 29.65 kg/m   BP Readings from Last 3 Encounters:  09/18/23 112/68  08/20/23 112/62  07/08/23 106/74    Wt Readings from Last 3 Encounters:  09/18/23 167 lb 6.4 oz (75.9 kg)  08/20/23 162 lb (73.5 kg)  07/08/23 162 lb 9.6 oz (73.8 kg)    Physical Exam  Lab Results  Component Value Date   HGBA1C 6.4 07/08/2023   HGBA1C 6.4 12/29/2022   HGBA1C 6.7 (A) 04/02/2022    Lab Results  Component Value Date   CREATININE 0.95 07/08/2023   CREATININE 1.03 05/05/2023   CREATININE 0.79 04/24/2023    Lab Results  Component Value Date   WBC 9.6 05/11/2023   HGB 12.7 05/11/2023   HCT 37.9 05/11/2023   PLT 441 (H) 05/11/2023   GLUCOSE 88 07/08/2023   CHOL 255 (H) 07/08/2023   TRIG 171 (H) 07/08/2023   HDL 56 07/08/2023   LDLDIRECT 152.0 12/29/2022   LDLCALC 167 (H) 07/08/2023   ALT 12 07/08/2023   AST 14 07/08/2023   NA 138 07/08/2023   K 4.3 07/08/2023   CL 103 07/08/2023   CREATININE 0.95 07/08/2023   BUN 17 07/08/2023   CO2 27 07/08/2023   TSH 2.24 07/08/2023   INR 0.9 04/23/2023   HGBA1C 6.4 07/08/2023   MICROALBUR 1.2 12/31/2022    No results found.  Assessment & Plan:  .There are no diagnoses linked to this encounter.   I provided 30 minutes of face-to-face time during this encounter reviewing patient's last visit with me, patient's  most recent visit with cardiology,  nephrology,  and neurology,  recent surgical and non surgical procedures, previous  labs and imaging studies, counseling on currently addressed issues,  and post visit ordering to diagnostics and therapeutics .   Follow-up: No follow-ups on file.   Sherlene Shams, MD

## 2023-09-18 NOTE — Assessment & Plan Note (Addendum)
surgery May 13 was  complicated by seroma  which occurred after  an injury .  She remains in pain and is taking oxyodone and tylenol only .   NSAIDS were stopped due to acute renal failure

## 2023-09-19 LAB — MICROALBUMIN / CREATININE URINE RATIO
Creatinine, Urine: 79 mg/dL (ref 20–275)
Microalb Creat Ratio: 5 mg/g{creat} (ref <30)
Microalb, Ur: 0.4 mg/dL

## 2023-09-20 DIAGNOSIS — Z87448 Personal history of other diseases of urinary system: Secondary | ICD-10-CM | POA: Insufficient documentation

## 2023-09-20 DIAGNOSIS — G8929 Other chronic pain: Secondary | ICD-10-CM | POA: Insufficient documentation

## 2023-09-20 NOTE — Assessment & Plan Note (Addendum)
With fluid collection noted on April 23 2023 CT hip done for evaluation of persistent pain .    Cautious restart of meloxicam with repeat GFR in 2 week s

## 2023-09-20 NOTE — Assessment & Plan Note (Signed)
-  controlled with metformin only .Marland Kitchen Repeat A1c is due next month  Lab Results  Component Value Date   HGBA1C 6.4 07/08/2023

## 2023-09-20 NOTE — Assessment & Plan Note (Addendum)
She had a transient elevation of Cr to 2.9  that was noted in March 2024 after months of daily meloxicam  and resolved completely after just 2 days  suspension of meloxicam .  .Renal ultrasound ws normal.

## 2023-09-20 NOTE — Assessment & Plan Note (Addendum)
Aggravated by recent events,  both personal and political.  Counselling given.   Continue sertraline .  Continue use of Xanax prn .The risks and benefits of  Chronic  benzodiazepine use were discussed with patient today including increased risk of dementia,  Addiction, and seizures if abruptly withdrawn.  She has eliminated use  of clonazepam

## 2023-09-20 NOTE — Assessment & Plan Note (Addendum)
She is taking oxycodone  for pain management  and was offered a spinal cord stimulator which was deferred due to hip surgery followed by hospitalization for pneumonia

## 2023-09-20 NOTE — Assessment & Plan Note (Signed)
Managed with 5 mg ambien alternating with benadryl . Refills given

## 2023-09-25 ENCOUNTER — Other Ambulatory Visit: Payer: Self-pay | Admitting: Internal Medicine

## 2023-09-25 DIAGNOSIS — G4700 Insomnia, unspecified: Secondary | ICD-10-CM

## 2023-09-25 DIAGNOSIS — F319 Bipolar disorder, unspecified: Secondary | ICD-10-CM

## 2023-09-28 ENCOUNTER — Telehealth: Payer: Self-pay | Admitting: Neurosurgery

## 2023-09-28 NOTE — Telephone Encounter (Signed)
Patient is scheduled for an appt on 10/15/23 to review all her 4 MRI results. She is worried that results showed that she might have had a stroke and wants to be seen sooner. Is there anything alarming on her results that she should be worried about?

## 2023-09-30 ENCOUNTER — Other Ambulatory Visit: Payer: Self-pay

## 2023-09-30 DIAGNOSIS — E538 Deficiency of other specified B group vitamins: Secondary | ICD-10-CM

## 2023-09-30 NOTE — Telephone Encounter (Signed)
Patient confirmed appt with Dr.Yarbrough on 10/06/23.

## 2023-10-01 ENCOUNTER — Inpatient Hospital Stay: Payer: 59 | Attending: Oncology

## 2023-10-02 ENCOUNTER — Ambulatory Visit (INDEPENDENT_AMBULATORY_CARE_PROVIDER_SITE_OTHER): Payer: 59 | Admitting: Neurosurgery

## 2023-10-02 DIAGNOSIS — M4714 Other spondylosis with myelopathy, thoracic region: Secondary | ICD-10-CM

## 2023-10-02 NOTE — Progress Notes (Signed)
I spoke with Kathleen Carr briefly regarding her recent MRI results. She has an upcoming appointment with Dr. Myer Haff as well but was concerned that her MRI findings were consistent with MRI. I expressed that this was unlikely and that the changes in her thoracic spine are most consistent with severe stenosis and spinal cord compression. I also reiterated that this was unlikely to be the cause for all of the symptoms we discussed at her last visit but could certainly explain some of them particularly her imbalance, lower extremity weakness, and thoracic back pain. She will discuss her MRI findings in more detail with Dr. Myer Haff at her visit next week but was encouraged to call us in the meantime with any questions or concerns. [name] PA-C

## 2023-10-06 ENCOUNTER — Encounter: Payer: Self-pay | Admitting: Neurosurgery

## 2023-10-06 ENCOUNTER — Ambulatory Visit (INDEPENDENT_AMBULATORY_CARE_PROVIDER_SITE_OTHER): Payer: 59 | Admitting: Neurosurgery

## 2023-10-06 VITALS — BP 130/78 | Ht 63.0 in | Wt 167.0 lb

## 2023-10-06 DIAGNOSIS — G894 Chronic pain syndrome: Secondary | ICD-10-CM

## 2023-10-06 DIAGNOSIS — R200 Anesthesia of skin: Secondary | ICD-10-CM

## 2023-10-06 DIAGNOSIS — R202 Paresthesia of skin: Secondary | ICD-10-CM | POA: Diagnosis not present

## 2023-10-06 DIAGNOSIS — M4714 Other spondylosis with myelopathy, thoracic region: Secondary | ICD-10-CM | POA: Diagnosis not present

## 2023-10-06 NOTE — Patient Instructions (Signed)
[facility] - televisits [phone] Not in network with [facility] - televisits [phone] Not in network with [facility] in [place] [phone] Accepts [facility] Dr Kieth Brightly in [place] [phone] Accepts [facility], but is typically booked out about 10 months Dr Orie Fisherman in [place] - also does televisits [phone] Dr Irish Lack in [place] [phone] Dr Mindi Slicker in [place] - also does televisits [phone] [facility] (offices in [place], [place], and [place]) [phone]   Please let us know which one of the above psychologist's you would like to see for evaluation prior to the spinal cord stimulator trial. These are the only providers we are aware of that perform this type of evaluation. Once we fax the referral, please call them to set up an appointment (they do not typically call you).

## 2023-10-06 NOTE — Progress Notes (Signed)
Referring Physician:  Sherlene Shams, MD [address] [place],  [place] [address]  Primary Physician:  Sherlene Shams, MD  History of Present Illness: 10/06/2023 Kathleen Carr returns to see me.  She continues to have pain in her lower back and leg as noted below.  She has numbness that hits her right arm and right leg.  She has had some weakness in her right lower extremity.  11/27/2022 Kathleen Carr is here today with a chief complaint of low back and right buttock and groin pain.  She also has pain on her anterior thigh and in her right lateral calf.  She has been having this pain for several years.  She underwent a left-sided L4-5 microdiscectomy in 2021 for left leg pain.  That pain has improved, but her back pain is worsened over time.  Prolonged standing, walking, and sitting make it worse.  Changing positions helps temporarily.  Medications have helped a small amount.  She had a very bad reaction to an injection during her previous bout with sciatica, so is not interested in considering further injections.  Bowel/Bladder Dysfunction: none  Conservative measures:  Physical therapy:  has not participated for her back; has participated in for her right hip from 08/25/22 to 10/30/22, but it was making her back pain worse. Multimodal medical therapy including regular antiinflammatories:  tylenol, ibuprofen, mobic, tramadol, and flexeril, methocarbamol  Injections:  has received epidural steroid injections 01/09/21: Right L4-5 and L5-S1 facet joint injection   Past Surgery: Left L4/5 discectomy in 2021 with [name] has no symptoms of cervical myelopathy.  The symptoms are causing a significant impact on the patient's life.   I have utilized the care everywhere function in epic to review the outside records available from external health systems.  Telephone visit with [name], PA-C on 11/13/2022: I spoke with Kathleen Carr today regarding pain back and  leg pain.  She has been in physical therapy for her hip but is having increased pain and is not able to participate with this.  She would like to move forward with surgical consideration.  I did encourage her to contact the physical therapist and see if they are willing to formally discharge her from PT given her increased pain and inability to participate.  She will do that today.  I have also reviewed her imaging and symptoms with [name] who felt that a discussion of possible surgical intervention was reasonable.  Will get her scheduled to see [name] to discuss her options.  She was encouraged to call the office should she have any questions or concerns.  Office visit with [name], PA-C on 10/23/2022: Kathleen Carr is a 52 year old presenting today for further evaluation of persistent back pain.  She has since undergone her right hip surgery which has provided significant relief to her right groin pain however despite this she has persistent low back pain that is debilitating and unchanged in nature.  In addition to this she describes some discomfort in her left lateral leg.  She denies any other radiating leg symptoms.  She would like further evaluation and treatment of this.   Office visit with Drake Leach, PA-C on 07/22/2022: History of Left L5-S1 lumbar laminectomy in 2021 with [name].    History of bipolar, DM, GERD, HTN, and PTSD.    Last seen by Kathleen Carr on 03/20/22 for LBP with right groin and anterior thigh pain.    MRI  02/21/22 showed multilevel degenerative changes the lumbar spine, worst at L4-L5 where there is asymmetric left disc bulging and facet arthropathy resulting in severe left-sided neural foraminal stenosis and exiting nerve root impingement. Mild spinal canal and moderate right neural foraminal stenosis at this level.    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 Discussed lumbar ESI and PT, she wanted  to hold off on these.    She is here for follow up.    She saw ortho and is scheduled for right hip surgery in October (labral tear and bone spurs per patient).    She has constant right > left sided LBP with some pulling in front right thigh and left posterior thigh. Leg pain is intermittent. Pain is worse with prolonged standing and walking. She has pain with carrying laundry basket. Some relief relief with changing position. No numbness or tingling. No weakness noted. No bowel or bladder issues.    She is on flexeril and ultram. She has minimal improvement with these. No recent lumbar ESIs (had prior to surgery in 2021). No recent PT for her lower back.   Office visit with [name], PA-C on 02/04/2022: Kathleen Carr is a 52 y.o with a history of diabetes (last A1c 7.2), HLD, and previous lumbar laminectomy who is here today with a chief complaint of low back pain and right radiating leg pain. She states this is been going on for about 2 years that any particular inciting event. She states that it feels like a crunching in her back when sitting upright and pulling radiating pain into her right groin and anterior thigh without extension below the knee. Her symptoms are worse with sitting for prolonged periods of time or walking for greater than 10 to 15 minutes and improves with laying flat on her back with her knees up. She states that this feels similar to the pain that she was having prior to her lumbar laminectomy in 2021. She did initially go back to [name] after onset of the symptoms but was told there is nothing they can do. She states that she has not had any recent MRI. She did undergo a couple weeks of physical therapy but was unable to complete 6 weeks due to pain. She denies any similar left-sided symptoms. Of note she admits to about 1/2 pack per day of smoking     Review of Systems:  A 10 point review of systems is negative, except for the pertinent positives and negatives  detailed in the HPI.  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   Pneumonia    PTSD (post-traumatic stress disorder)    Smoker    Thyroid disease     Past Surgical History: Past Surgical History:  Procedure Laterality Date   ABDOMINAL HYSTERECTOMY  12/03/2011   complete   ADENOIDECTOMY, TONSILLECTOMY AND MYRINGOTOMY WITH TUBE PLACEMENT  1977   CARPAL TUNNEL RELEASE Right 03/12/2022   Procedure: RIGHT CARPAL TUNNEL RELEASE;  Surgeon: Marlyne Beards, MD;  Location: [facility];  Service: Orthopedics;  Laterality: Right;   CARPAL TUNNEL RELEASE Left 04/16/2022   Procedure: LEFT CARPAL TUNNEL RELEASE;  Surgeon: Marlyne Beards, MD;  Location: [facility];  Service: Orthopedics;  Laterality: Left;   CHOLECYSTECTOMY  06/08/2011   COLONOSCOPY WITH PROPOFOL N/A 02/05/2022   Procedure: COLONOSCOPY WITH PROPOFOL;  Surgeon: Wyline Mood, MD;  Location: [facility];  Service: Gastroenterology;  Laterality: N/A;   ESOPHAGOGASTRODUODENOSCOPY N/A 02/05/2022   Procedure: ESOPHAGOGASTRODUODENOSCOPY (EGD);  Surgeon: Wyline Mood, MD;  Location: [facility];  Service: Gastroenterology;  Laterality: N/A;   HIP ARTHROSCOPY Right 08/19/2022   Procedure: Right hip arthroscopy, acetabuloplasty, labral repair, femoral osteochondroplasty, capsular closure;  Surgeon: Signa Kell, MD;  Location: [facility];  Service: Orthopedics;  Laterality: Right;   KNEE ARTHROSCOPY Right 1993   KNEE ARTHROSCOPY Left 1998   LUMBAR LAMINECTOMY Left 2021   TOTAL HIP ARTHROPLASTY Right 03/09/2023   Procedure: TOTAL HIP ARTHROPLASTY  ANTERIOR APPROACH;  Surgeon: Reinaldo Berber, MD;  Location: [facility];  Service: Orthopedics;  Laterality: Right;    Allergies: Allergies as of 10/06/2023 - Review Complete 10/06/2023  Allergen Reaction Noted   Dust mite mixed allergen ext [mite (d. farinae)]  08/01/2020   Nsaids  12/31/2022   Other Hives 08/01/2020   Sulfa antibiotics Hives 06/24/2020   Latex Itching 08/12/2022   Misc. sulfonamide containing compounds Hives 05/05/2022    Medications: Current Meds  Medication Sig   albuterol (VENTOLIN HFA) 108 (90 Base) MCG/ACT inhaler Inhale 1-2 puffs into the lungs every 6 (six) hours as needed for wheezing or shortness of breath.   ALPRAZolam (XANAX) 0.25 MG tablet Take 1-2 tablets (0.25-0.5 mg total) by mouth 2 (two) times daily as needed for anxiety.   aspirin 81 MG chewable tablet Chew 81 mg by mouth daily.   atorvastatin (LIPITOR) 20 MG tablet Take 1 tablet (20 mg total) by mouth daily.   cyanocobalamin (VITAMIN B12) 1000 MCG/ML injection Inject 1 mL (1,000 mcg total) into the muscle every 30 (thirty) days.   docusate sodium (COLACE) 100 MG capsule Take 100 mg by mouth daily.   levothyroxine (SYNTHROID) 50 MCG tablet TAKE 1 TABLET BY MOUTH DAILY BEFORE BREAKFAST.   loratadine (CLARITIN) 10 MG tablet Take 10 mg by mouth daily as needed.   metFORMIN (GLUCOPHAGE) 1000 MG tablet TAKE 1 TABLET BY MOUTH TWICE A DAY WITH FOOD   methocarbamol (ROBAXIN) 500 MG tablet Take 500 mg by mouth 3 (three) times daily.   ondansetron (ZOFRAN) 4 MG tablet Take 1 tablet (4 mg total) by mouth every 6 (six) hours as needed for nausea.   oxybutynin (DITROPAN XL) 15 MG 24 hr tablet Take 1 tablet (15 mg total) by mouth at bedtime.   oxyCODONE (OXY IR/ROXICODONE) 5 MG immediate release tablet Take 0.5-1 tablets (2.5-5 mg total) by mouth every 6 (six) hours as needed for severe pain or breakthrough pain.   pantoprazole (PROTONIX) 40 MG tablet Take 1 tablet (40 mg total) by mouth daily.   QUEtiapine  (SEROQUEL) 300 MG tablet TAKE 1 TABLET BY MOUTH EVERYDAY AT BEDTIME   rOPINIRole (REQUIP) 0.25 MG tablet TAKE 1 TABLET BY MOUTH 3 TIMES DAILY.   sertraline (ZOLOFT) 100 MG tablet TAKE 1 TABLET BY MOUTH EVERY DAY   Syringe/Needle, Disp, (SYRINGE 3CC/25GX1") 25G X 1" 3 ML MISC 1 Syringe by Does not apply route every 30 (thirty) days.   zolpidem (AMBIEN) 5 MG tablet TAKE 1 TABLET BY MOUTH EVERY DAY AT BEDTIME AS NEEDED FOR  SLEEP    Social History: Social History   Tobacco Use   Smoking status: Former    Current packs/day: 0.00    Average packs/day: 0.5 packs/day for 42.0 years (21.0 ttl pk-yrs)    Types: Cigarettes    Start date: 01/25/1981    Quit date: 01/26/2023    Years since quitting: 0.6   Smokeless tobacco: Never  Vaping Use   Vaping status: Former   Substances: Nicotine  Substance Use Topics   Alcohol use: Not Currently   Drug use: Not Currently    Family Medical History: Family History  Problem Relation Age of Onset   Hypertension Mother    Thyroid disease Mother    Lung cancer Father 3       carcinoid, right lung   Ulcerative colitis Father    Renal Disease Maternal Grandfather    Diabetes Maternal Grandfather        Type2   Glaucoma Maternal Grandmother    Stroke Paternal Grandfather    Hypertension Paternal Grandfather     Physical Examination: Vitals:   10/06/23 1402  BP: 130/78    General: Patient is well developed, well nourished, calm, collected, and in no apparent distress. Attention to examination is appropriate.  Neck:   Supple.  Full range of motion.  Respiratory: Patient is breathing without any difficulty.   NEUROLOGICAL:     Awake, alert, oriented to person, place, and time.  Speech is clear and fluent.   Cranial Nerves: Pupils equal round and reactive to light.  Facial tone is symmetric.  Facial sensation is symmetric. Shoulder shrug is symmetric. Tongue protrusion is midline.  There is no pronator drift.  ROM of spine: full.     Strength: Side Biceps Triceps Deltoid Interossei Grip Wrist Ext. Wrist Flex.  R 5 5 5 5 5 5 5   L 5 5 5 5 5 5 5    Side Iliopsoas Quads Hamstring PF DF EHL  R 4+ 5 5 5 5 5   L 5 5 5 5 5 5    Reflexes are 1+ and symmetric at the biceps, triceps, brachioradialis, patella and achilles.   Hoffman's is absent.   Bilateral upper and lower extremity sensation is intact to light touch.    No evidence of dysmetria noted.  Gait is antalgic.     Medical Decision Making  Imaging: MRI L spine 02/21/2022 Disc levels:   T11-T12: No significant spinal canal or neural foraminal narrowing.   T12-L1: No significant spinal canal or neural foraminal narrowing.   L1-L2: Minimal disc bulging.  No significant stenosis.   L2-L3: Mild disc bulging, ligamentum flavum hypertrophy and bowel facet arthropathy. There is mild spinal canal stenosis and minimal bilateral neural foraminal narrowing.   L3-L4: Mild bilateral facet arthropathy. No significant spinal canal or neural foraminal stenosis.   L4-L5: Asymmetric left disc bulging, ligament flavum hypertrophy mild facet arthropathy. There is mild spinal canal stenosis, severe left and moderate right neural foraminal stenosis.   L5-S1: No significant spinal canal or neural foraminal narrowing.   IMPRESSION: Multilevel degenerative changes the lumbar spine, worst at L4-L5 where there is asymmetric left disc bulging and facet arthropathy resulting in severe left-sided neural foraminal stenosis and exiting nerve root impingement. Mild spinal canal and moderate right neural foraminal stenosis at this level.   Milder degenerative changes from L1 through L4 as described above.     Electronically Signed   By: Caprice Renshaw M.D.   On: 02/21/2022 09:06  MRI CTL spine 09/01/2023  IMPRESSION: 1. New focus of increased T2 signal within the right hemicord at T4-T5, just below where a prominent central disc protrusion flattens the right ventral cord.  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 2. Otherwise unchanged multilevel thoracic spondylosis as described above. Unchanged mild spinal canal stenosis at T7-T8. 3. Unchanged trace bilateral pleural effusions.     Electronically Signed   By: Obie Dredge M.D.   On: 09/23/2023 08:50 FINDINGS: Alignment: Straightening of the normal cervical lordosis. No significant listhesis.   Vertebrae: No fracture, evidence of discitis, or bone lesion.   Cord: Normal signal and morphology.   Posterior Fossa, vertebral arteries, paraspinal tissues: Negative.   Disc levels:   C2-C3:  No significant disc bulge or herniation.  No stenosis.   C3-C4:  No significant disc bulge or herniation.  No stenosis.   C4-C5: No significant disc bulge or herniation. Moderate right and mild left facet uncovertebral hypertrophy. Moderate right neuroforaminal stenosis. No spinal canal or left neuroforaminal stenosis.   C5-C6: No significant disc bulge or herniation. Mild bilateral facet uncovertebral hypertrophy. No stenosis.   C6-C7: Mild disc bulging and bilateral uncovertebral hypertrophy. No stenosis.   C7-T1: Negative disc. Moderate right facet arthropathy. No stenosis.   IMPRESSION: 1. Multilevel cervical spondylosis as described above. Moderate right neuroforaminal stenosis at C4-C5.     Electronically Signed   By: Obie Dredge M.D.    On: 09/23/2023 08:55 L4-L5: Postsurgical change. Unchanged mild-to-moderate disc bulging with superimposed small left foraminal disc protrusion. Unchanged moderate bilateral facet arthropathy. Unchanged mild bilateral lateral recess stenosis. Unchanged mild right and moderate to severe left neuroforaminal stenosis. No spinal canal stenosis.   L5-S1: Negative disc. Unchanged moderate bilateral facet arthropathy. No stenosis.   IMPRESSION: 1. Unchanged multilevel lumbar spondylosis as described above. Unchanged moderate to severe left  neuroforaminal stenosis at L4-L5.     Electronically Signed   By: Obie Dredge M.D.   On: 09/23/2023 09:03   I have personally reviewed the images and agree with the above interpretation.  Assessment and Plan: Ms. Mccuen is a pleasant 52 y.o. female with back pain with right leg pain concerning for sciatica.  She has chronic pain syndrome.  She continues to have right leg symptoms.  I have recommended that she consider spinal cord stimulator evaluation.  I will make referrals for this.  This is to address her chronic pain syndrome and neuropathic symptoms.  I cannot explain her right arm and leg numbness based on her imaging findings.  I will refer her for neurology evaluation.  I do not think her thoracic disc herniations are significant enough to cause thoracic myelopathy.  She does have a small area of possible myelomalacia, but the disc herniations are not large and she only has mild stenosis.  We will monitor this.    I will see her back in 2 months.    I spent a total of 15 minutes in this patient's care today. This time was spent reviewing pertinent records including imaging studies, obtaining and confirming history, performing a directed evaluation, formulating and discussing my recommendations, and documenting the visit within the medical record.    Thank you for involving me in the care of this patient.      Bobie Kistler K. Myer Haff MD, [facility]surgery

## 2023-10-13 ENCOUNTER — Encounter: Payer: Self-pay | Admitting: Internal Medicine

## 2023-10-13 NOTE — Telephone Encounter (Signed)
 Care team updated and letter sent for eye exam notes.

## 2023-10-15 ENCOUNTER — Ambulatory Visit: Payer: 59 | Admitting: Neurosurgery

## 2023-10-26 DIAGNOSIS — F319 Bipolar disorder, unspecified: Secondary | ICD-10-CM | POA: Diagnosis not present

## 2023-10-26 DIAGNOSIS — J45909 Unspecified asthma, uncomplicated: Secondary | ICD-10-CM | POA: Diagnosis not present

## 2023-10-26 DIAGNOSIS — F1120 Opioid dependence, uncomplicated: Secondary | ICD-10-CM | POA: Diagnosis not present

## 2023-10-26 DIAGNOSIS — F419 Anxiety disorder, unspecified: Secondary | ICD-10-CM | POA: Diagnosis not present

## 2023-10-26 DIAGNOSIS — Z8249 Family history of ischemic heart disease and other diseases of the circulatory system: Secondary | ICD-10-CM | POA: Diagnosis not present

## 2023-10-26 DIAGNOSIS — Z87891 Personal history of nicotine dependence: Secondary | ICD-10-CM | POA: Diagnosis not present

## 2023-10-26 DIAGNOSIS — Z7984 Long term (current) use of oral hypoglycemic drugs: Secondary | ICD-10-CM | POA: Diagnosis not present

## 2023-10-26 DIAGNOSIS — E119 Type 2 diabetes mellitus without complications: Secondary | ICD-10-CM | POA: Diagnosis not present

## 2023-10-26 DIAGNOSIS — E785 Hyperlipidemia, unspecified: Secondary | ICD-10-CM | POA: Diagnosis not present

## 2023-10-26 DIAGNOSIS — K219 Gastro-esophageal reflux disease without esophagitis: Secondary | ICD-10-CM | POA: Diagnosis not present

## 2023-10-26 DIAGNOSIS — M1990 Unspecified osteoarthritis, unspecified site: Secondary | ICD-10-CM | POA: Diagnosis not present

## 2023-10-26 DIAGNOSIS — I10 Essential (primary) hypertension: Secondary | ICD-10-CM | POA: Diagnosis not present

## 2023-11-16 ENCOUNTER — Ambulatory Visit
Admission: RE | Admit: 2023-11-16 | Discharge: 2023-11-16 | Disposition: A | Discharge status: Home / Self Care | Payer: 59 | Source: Ambulatory Visit | Attending: Internal Medicine | Admitting: Internal Medicine

## 2023-11-16 ENCOUNTER — Encounter: Payer: Self-pay | Admitting: Internal Medicine

## 2023-11-16 DIAGNOSIS — M8580 Other specified disorders of bone density and structure, unspecified site: Secondary | ICD-10-CM | POA: Insufficient documentation

## 2023-11-16 DIAGNOSIS — Z780 Asymptomatic menopausal state: Secondary | ICD-10-CM | POA: Insufficient documentation

## 2023-11-16 DIAGNOSIS — Z1231 Encounter for screening mammogram for malignant neoplasm of breast: Secondary | ICD-10-CM | POA: Insufficient documentation

## 2023-11-16 DIAGNOSIS — J45909 Unspecified asthma, uncomplicated: Secondary | ICD-10-CM | POA: Diagnosis not present

## 2023-11-17 ENCOUNTER — Other Ambulatory Visit: Payer: Self-pay | Admitting: Internal Medicine

## 2023-11-17 ENCOUNTER — Ambulatory Visit (INDEPENDENT_AMBULATORY_CARE_PROVIDER_SITE_OTHER): Payer: 59 | Admitting: Neurosurgery

## 2023-11-17 VITALS — BP 124/78 | Ht 63.0 in | Wt 167.0 lb

## 2023-11-17 DIAGNOSIS — G894 Chronic pain syndrome: Secondary | ICD-10-CM

## 2023-11-17 DIAGNOSIS — F319 Bipolar disorder, unspecified: Secondary | ICD-10-CM

## 2023-11-17 DIAGNOSIS — G4700 Insomnia, unspecified: Secondary | ICD-10-CM

## 2023-11-17 NOTE — Progress Notes (Signed)
Referring Physician:  Sherlene Shams, MD [address] [place],  [place] [address]  Primary Physician:  Sherlene Shams, MD  History of Present Illness: 11/17/2023  10/06/2023 Kathleen Carr returns to see me.  She continues to have pain in her lower back and leg as noted below.  She has numbness that hits her right arm and right leg.  She has had some weakness in her right lower extremity.  11/27/2022 Kathleen Carr is here today with a chief complaint of low back and right buttock and groin pain.  She also has pain on her anterior thigh and in her right lateral calf.  She has been having this pain for several years.  She underwent a left-sided L4-5 microdiscectomy in 2021 for left leg pain.  That pain has improved, but her back pain is worsened over time.  Prolonged standing, walking, and sitting make it worse.  Changing positions helps temporarily.  Medications have helped a small amount.  She had a very bad reaction to an injection during her previous bout with sciatica, so is not interested in considering further injections.  Bowel/Bladder Dysfunction: none  Conservative measures:  Physical therapy:  has not participated for her back; has participated in for her right hip from 08/25/22 to 10/30/22, but it was making her back pain worse. Multimodal medical therapy including regular antiinflammatories:  tylenol, ibuprofen, mobic, tramadol, and flexeril, methocarbamol  Injections:  has received epidural steroid injections 01/09/21: Right L4-5 and L5-S1 facet joint injection   Past Surgery: Left L4/5 discectomy in 2021 with Dr. Reed Breech D. Constance has no symptoms of cervical myelopathy.  The symptoms are causing a significant impact on the patient's life.   I have utilized the care everywhere function in epic to review the outside records available from external health systems.  Telephone visit with [name], PA-C on 11/13/2022: I spoke with Mrs. Moncrief today regarding  pain back and leg pain.  She has been in physical therapy for her hip but is having increased pain and is not able to participate with this.  She would like to move forward with surgical consideration.  I did encourage her to contact the physical therapist and see if they are willing to formally discharge her from PT given her increased pain and inability to participate.  She will do that today.  I have also reviewed her imaging and symptoms with [name] who felt that a discussion of possible surgical intervention was reasonable.  Will get her scheduled to see [name] to discuss her options.  She was encouraged to call the office should she have any questions or concerns.  Office visit with [name], PA-C on 10/23/2022: Kathleen Carr is a 53 year old presenting today for further evaluation of persistent back pain.  She has since undergone her right hip surgery which has provided significant relief to her right groin pain however despite this she has persistent low back pain that is debilitating and unchanged in nature.  In addition to this she describes some discomfort in her left lateral leg.  She denies any other radiating leg symptoms.  She would like further evaluation and treatment of this.   Office visit with Drake Leach, PA-C on 07/22/2022: History of Left L5-S1 lumbar laminectomy in 2021 with [name].    History of bipolar, DM, GERD, HTN, and PTSD.    Last seen by [name] on 03/20/22 for LBP with right groin and anterior thigh pain.  MRI 02/21/22 showed multilevel degenerative changes the lumbar spine, worst at L4-L5 where there is asymmetric left disc bulging and facet arthropathy resulting in severe left-sided neural foraminal stenosis and exiting nerve root impingement. Mild spinal canal and moderate right neural foraminal stenosis at this level.    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 Discussed lumbar ESI and  PT, she wanted to hold off on these.    She is here for follow up.    She saw ortho and is scheduled for right hip surgery in October (labral tear and bone spurs per patient).    She has constant right > left sided LBP with some pulling in front right thigh and left posterior thigh. Leg pain is intermittent. Pain is worse with prolonged standing and walking. She has pain with carrying laundry basket. Some relief relief with changing position. No numbness or tingling. No weakness noted. No bowel or bladder issues.    She is on flexeril and ultram. She has minimal improvement with these. No recent lumbar ESIs (had prior to surgery in 2021). No recent PT for her lower back.   Office visit with [name], PA-C on 02/04/2022: Kathleen Carr is a 53 y.o with a history of diabetes (last A1c 7.2), HLD, and previous lumbar laminectomy who is here today with a chief complaint of low back pain and right radiating leg pain. She states this is been going on for about 2 years that any particular inciting event. She states that it feels like a crunching in her back when sitting upright and pulling radiating pain into her right groin and anterior thigh without extension below the knee. Her symptoms are worse with sitting for prolonged periods of time or walking for greater than 10 to 15 minutes and improves with laying flat on her back with her knees up. She states that this feels similar to the pain that she was having prior to her lumbar laminectomy in 2021. She did initially go back to [name] after onset of the symptoms but was told there is nothing they can do. She states that she has not had any recent MRI. She did undergo a couple weeks of physical therapy but was unable to complete 6 weeks due to pain. She denies any similar left-sided symptoms. Of note she admits to about 1/2 pack per day of smoking     Review of Systems:  A 10 point review of systems is negative, except for the pertinent positives and  negatives detailed in the HPI.  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   Pneumonia    PTSD (post-traumatic stress disorder)    Smoker    Thyroid disease     Past Surgical History: Past Surgical History:  Procedure Laterality Date   ABDOMINAL HYSTERECTOMY  12/03/2011   complete   ADENOIDECTOMY, TONSILLECTOMY AND MYRINGOTOMY WITH TUBE PLACEMENT  1977   CARPAL TUNNEL RELEASE Right 03/12/2022   Procedure: RIGHT CARPAL TUNNEL RELEASE;  Surgeon: Marlyne Beards, MD;  Location: [facility];  Service: Orthopedics;  Laterality: Right;   CARPAL TUNNEL RELEASE Left 04/16/2022   Procedure: LEFT CARPAL TUNNEL RELEASE;  Surgeon: Marlyne Beards, MD;  Location: [facility];  Service: Orthopedics;  Laterality: Left;   CHOLECYSTECTOMY  06/08/2011   COLONOSCOPY WITH PROPOFOL N/A 02/05/2022   Procedure: COLONOSCOPY WITH PROPOFOL;  Surgeon: Wyline Mood, MD;  Location: [facility];  Service: Gastroenterology;  Laterality: N/A;   ESOPHAGOGASTRODUODENOSCOPY N/A 02/05/2022   Procedure: ESOPHAGOGASTRODUODENOSCOPY (EGD);  Surgeon: Wyline Mood, MD;  Location: [facility];  Service: Gastroenterology;  Laterality: N/A;   HIP ARTHROSCOPY Right 08/19/2022   Procedure: Right hip arthroscopy, acetabuloplasty, labral repair, femoral osteochondroplasty, capsular closure;  Surgeon: Signa Kell, MD;  Location: [facility];  Service: Orthopedics;  Laterality: Right;   KNEE ARTHROSCOPY Right 1993   KNEE ARTHROSCOPY Left 1998   LUMBAR LAMINECTOMY Left 2021   TOTAL HIP ARTHROPLASTY Right 03/09/2023   Procedure: TOTAL HIP  ARTHROPLASTY ANTERIOR APPROACH;  Surgeon: Reinaldo Berber, MD;  Location: [facility];  Service: Orthopedics;  Laterality: Right;    Allergies: Allergies as of 11/17/2023 - Review Complete 11/17/2023  Allergen Reaction Noted   Dust mite mixed allergen ext [mite (d. farinae)]  08/01/2020   Nsaids  12/31/2022   Other Hives 08/01/2020   Sulfa antibiotics Hives 06/24/2020   Latex Itching 08/12/2022   Misc. sulfonamide containing compounds Hives 05/05/2022    Medications: Current Meds  Medication Sig   albuterol (VENTOLIN HFA) 108 (90 Base) MCG/ACT inhaler Inhale 1-2 puffs into the lungs every 6 (six) hours as needed for wheezing or shortness of breath.   ALPRAZolam (XANAX) 0.25 MG tablet Take 1-2 tablets (0.25-0.5 mg total) by mouth 2 (two) times daily as needed for anxiety.   atorvastatin (LIPITOR) 20 MG tablet Take 1 tablet (20 mg total) by mouth daily.   cyanocobalamin (VITAMIN B12) 1000 MCG/ML injection Inject 1 mL (1,000 mcg total) into the muscle every 30 (thirty) days.   docusate sodium (COLACE) 100 MG capsule Take 100 mg by mouth daily.   levothyroxine (SYNTHROID) 50 MCG tablet TAKE 1 TABLET BY MOUTH DAILY BEFORE BREAKFAST.   loratadine (CLARITIN) 10 MG tablet Take 10 mg by mouth daily as needed.   metFORMIN (GLUCOPHAGE) 1000 MG tablet TAKE 1 TABLET BY MOUTH TWICE A DAY WITH FOOD   methocarbamol (ROBAXIN) 500 MG tablet Take 500 mg by mouth 3 (three) times daily.   ondansetron (ZOFRAN) 4 MG tablet Take 1 tablet (4 mg total) by mouth every 6 (six) hours as needed for nausea.   oxybutynin (DITROPAN XL) 15 MG 24 hr tablet Take 1 tablet (15 mg total) by mouth at bedtime.   oxyCODONE (OXY IR/ROXICODONE) 5 MG immediate release tablet Take 0.5-1 tablets (2.5-5 mg total) by mouth every 6 (six) hours as needed for severe pain or breakthrough pain.   pantoprazole (PROTONIX) 40 MG tablet Take 1 tablet (40 mg total) by mouth daily.   QUEtiapine (SEROQUEL) 300 MG tablet TAKE 1 TABLET BY MOUTH  EVERYDAY AT BEDTIME   rOPINIRole (REQUIP) 0.25 MG tablet TAKE 1 TABLET BY MOUTH 3 TIMES DAILY.   sertraline (ZOLOFT) 100 MG tablet TAKE 1 TABLET BY MOUTH EVERY DAY   Syringe/Needle, Disp, (SYRINGE 3CC/25GX1") 25G X 1" 3 ML MISC 1 Syringe by Does not apply route every 30 (thirty) days.   zolpidem (AMBIEN) 5 MG tablet TAKE 1 TABLET BY MOUTH EVERY DAY AT BEDTIME AS NEEDED FOR SLEEP    Social History: Social History   Tobacco Use  Smoking status: Former    Current packs/day: 0.00    Average packs/day: 0.5 packs/day for 42.0 years (21.0 ttl pk-yrs)    Types: Cigarettes    Start date: 01/25/1981    Quit date: 01/26/2023    Years since quitting: 0.8   Smokeless tobacco: Never  Vaping Use   Vaping status: Former   Substances: Nicotine  Substance Use Topics   Alcohol use: Not Currently   Drug use: Not Currently    Family Medical History: Family History  Problem Relation Age of Onset   Hypertension Mother    Thyroid disease Mother    Lung cancer Father 26       carcinoid, right lung   Ulcerative colitis Father    Breast cancer Paternal Aunt        x3 aunts   Glaucoma Maternal Grandmother    Renal Disease Maternal Grandfather    Diabetes Maternal Grandfather        Type2   Stroke Paternal Grandfather    Hypertension Paternal Grandfather     Physical Examination: Vitals:   11/17/23 1537  BP: 124/78    General: Patient is well developed, well nourished, calm, collected, and in no apparent distress. Attention to examination is appropriate.  Neck:   Supple.  Full range of motion.  Respiratory: Patient is breathing without any difficulty.   NEUROLOGICAL:     Awake, alert, oriented to person, place, and time.  Speech is clear and fluent.   Cranial Nerves: Pupils equal round and reactive to light.  Facial tone is symmetric.  Facial sensation is symmetric. Shoulder shrug is symmetric. Tongue protrusion is midline.  There is no pronator drift.  ROM of spine: full.     Strength: Side Biceps Triceps Deltoid Interossei Grip Wrist Ext. Wrist Flex.  R 5 5 5 5 5 5 5   L 5 5 5 5 5 5 5    Side Iliopsoas Quads Hamstring PF DF EHL  R 4+ 5 5 5 5 5   L 5 5 5 5 5 5    Reflexes are 1+ and symmetric at the biceps, triceps, brachioradialis, patella and achilles.   Hoffman's is absent.   Bilateral upper and lower extremity sensation is intact to light touch.    No evidence of dysmetria noted.  Gait is antalgic.     Medical Decision Making  Imaging: MRI L spine 02/21/2022 Disc levels:   T11-T12: No significant spinal canal or neural foraminal narrowing.   T12-L1: No significant spinal canal or neural foraminal narrowing.   L1-L2: Minimal disc bulging.  No significant stenosis.   L2-L3: Mild disc bulging, ligamentum flavum hypertrophy and bowel facet arthropathy. There is mild spinal canal stenosis and minimal bilateral neural foraminal narrowing.   L3-L4: Mild bilateral facet arthropathy. No significant spinal canal or neural foraminal stenosis.   L4-L5: Asymmetric left disc bulging, ligament flavum hypertrophy mild facet arthropathy. There is mild spinal canal stenosis, severe left and moderate right neural foraminal stenosis.   L5-S1: No significant spinal canal or neural foraminal narrowing.   IMPRESSION: Multilevel degenerative changes the lumbar spine, worst at L4-L5 where there is asymmetric left disc bulging and facet arthropathy resulting in severe left-sided neural foraminal stenosis and exiting nerve root impingement. Mild spinal canal and moderate right neural foraminal stenosis at this level.   Milder degenerative changes from L1 through L4 as described above.     Electronically Signed   By: Caprice Renshaw M.D.   On: 02/21/2022 09:06  MRI CTL spine  09/01/2023 IMPRESSION: 1. New focus of increased T2 signal within the right hemicord at T4-T5, just below where a prominent central disc protrusion flattens the right ventral cord.  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 2. Otherwise unchanged multilevel thoracic spondylosis as described above. Unchanged mild spinal canal stenosis at T7-T8. 3. Unchanged trace bilateral pleural effusions.     Electronically Signed   By: Obie Dredge M.D.   On: 09/23/2023 08:50 FINDINGS: Alignment: Straightening of the normal cervical lordosis. No significant listhesis.   Vertebrae: No fracture, evidence of discitis, or bone lesion.   Cord: Normal signal and morphology.   Posterior Fossa, vertebral arteries, paraspinal tissues: Negative.   Disc levels:   C2-C3:  No significant disc bulge or herniation.  No stenosis.   C3-C4:  No significant disc bulge or herniation.  No stenosis.   C4-C5: No significant disc bulge or herniation. Moderate right and mild left facet uncovertebral hypertrophy. Moderate right neuroforaminal stenosis. No spinal canal or left neuroforaminal stenosis.   C5-C6: No significant disc bulge or herniation. Mild bilateral facet uncovertebral hypertrophy. No stenosis.   C6-C7: Mild disc bulging and bilateral uncovertebral hypertrophy. No stenosis.   C7-T1: Negative disc. Moderate right facet arthropathy. No stenosis.   IMPRESSION: 1. Multilevel cervical spondylosis as described above. Moderate right neuroforaminal stenosis at C4-C5.     Electronically Signed   By: Obie Dredge M.D.    On: 09/23/2023 08:55 L4-L5: Postsurgical change. Unchanged mild-to-moderate disc bulging with superimposed small left foraminal disc protrusion. Unchanged moderate bilateral facet arthropathy. Unchanged mild bilateral lateral recess stenosis. Unchanged mild right and moderate to severe left neuroforaminal stenosis. No spinal canal stenosis.   L5-S1: Negative disc. Unchanged moderate bilateral facet arthropathy. No stenosis.   IMPRESSION: 1. Unchanged multilevel lumbar spondylosis as described above. Unchanged moderate to severe left  neuroforaminal stenosis at L4-L5.     Electronically Signed   By: Obie Dredge M.D.   On: 09/23/2023 09:03   I have personally reviewed the images and agree with the above interpretation.  Assessment and Plan: Kathleen Carr is a pleasant 53 y.o. female with back pain with right leg pain concerning for sciatica.  She has chronic pain syndrome.  She continues to have right leg symptoms.  I have recommended that she consider spinal cord stimulator evaluation.  I will make referrals for this.  This is to address her chronic pain syndrome and neuropathic symptoms.  I cannot explain her right arm and leg numbness based on her imaging findings.  I will refer her for neurology evaluation.  I do not think her thoracic disc herniations are significant enough to cause thoracic myelopathy.  She does have a small area of possible myelomalacia, but the disc herniations are not large and she only has mild stenosis.  We will monitor this.    I will see her back in 2 months.    I spent a total of 15 minutes in this patient's care today. This time was spent reviewing pertinent records including imaging studies, obtaining and confirming history, performing a directed evaluation, formulating and discussing my recommendations, and documenting the visit within the medical record.    Thank you for involving me in the care of this patient.      Hayslee Casebolt K. Myer Haff MD, [facility]

## 2023-11-17 NOTE — Progress Notes (Signed)
Referring Physician:  Sherlene Shams, MD [address] [place],  [place] [address]  Primary Physician:  Sherlene Shams, MD  History of Present Illness: 11/17/2023 Kathleen Carr was scheduled today in error. Plan per prior note 10/06/2023.  10/06/2023 Kathleen Carr returns to see me.  She continues to have pain in her lower back and leg as noted below.  She has numbness that hits her right arm and right leg.  She has had some weakness in her right lower extremity.  11/27/2022 Kathleen Carr is here today with a chief complaint of low back and right buttock and groin pain.  She also has pain on her anterior thigh and in her right lateral calf.  She has been having this pain for several years.  She underwent a left-sided L4-5 microdiscectomy in 2021 for left leg pain.  That pain has improved, but her back pain is worsened over time.  Prolonged standing, walking, and sitting make it worse.  Changing positions helps temporarily.  Medications have helped a small amount.  She had a very bad reaction to an injection during her previous bout with sciatica, so is not interested in considering further injections.  Bowel/Bladder Dysfunction: none  Conservative measures:  Physical therapy:  has not participated for her back; has participated in for her right hip from 08/25/22 to 10/30/22, but it was making her back pain worse. Multimodal medical therapy including regular antiinflammatories:  tylenol, ibuprofen, mobic, tramadol, and flexeril, methocarbamol  Injections:  has received epidural steroid injections 01/09/21: Right L4-5 and L5-S1 facet joint injection   Past Surgery: Left L4/5 discectomy in 2021 with Dr. Reed Breech D. Wofford has no symptoms of cervical myelopathy.  The symptoms are causing a significant impact on the patient's life.   I have utilized the care everywhere function in epic to review the outside records available from external health systems.  Telephone visit with  [name], PA-C on 11/13/2022: I spoke with Kathleen Carr today regarding pain back and leg pain.  She has been in physical therapy for her hip but is having increased pain and is not able to participate with this.  She would like to move forward with surgical consideration.  I did encourage her to contact the physical therapist and see if they are willing to formally discharge her from PT given her increased pain and inability to participate.  She will do that today.  I have also reviewed her imaging and symptoms with [name] who felt that a discussion of possible surgical intervention was reasonable.  Will get her scheduled to see [name] to discuss her options.  She was encouraged to call the office should she have any questions or concerns.  Office visit with [name], PA-C on 10/23/2022: Kathleen Carr is a 53 year old presenting today for further evaluation of persistent back pain.  She has since undergone her right hip surgery which has provided significant relief to her right groin pain however despite this she has persistent low back pain that is debilitating and unchanged in nature.  In addition to this she describes some discomfort in her left lateral leg.  She denies any other radiating leg symptoms.  She would like further evaluation and treatment of this.   Office visit with Drake Leach, PA-C on 07/22/2022: History of Left L5-S1 lumbar laminectomy in 2021 with [name].    History of bipolar, DM, GERD, HTN, and PTSD.    Last seen by Duwayne Heck on  03/20/22 for LBP with right groin and anterior thigh pain.    MRI 02/21/22 showed multilevel degenerative changes the lumbar spine, worst at L4-L5 where there is asymmetric left disc bulging and facet arthropathy resulting in severe left-sided neural foraminal stenosis and exiting nerve root impingement. Mild spinal canal and moderate right neural foraminal stenosis at this level.    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 Discussed lumbar ESI and PT, she wanted to hold off on these.    She is here for follow up.    She saw ortho and is scheduled for right hip surgery in October (labral tear and bone spurs per patient).    She has constant right > left sided LBP with some pulling in front right thigh and left posterior thigh. Leg pain is intermittent. Pain is worse with prolonged standing and walking. She has pain with carrying laundry basket. Some relief relief with changing position. No numbness or tingling. No weakness noted. No bowel or bladder issues.    She is on flexeril and ultram. She has minimal improvement with these. No recent lumbar ESIs (had prior to surgery in 2021). No recent PT for her lower back.   Office visit with [name], PA-C on 02/04/2022: Kathleen Carr is a 53 y.o with a history of diabetes (last A1c 7.2), HLD, and previous lumbar laminectomy who is here today with a chief complaint of low back pain and right radiating leg pain. She states this is been going on for about 2 years that any particular inciting event. She states that it feels like a crunching in her back when sitting upright and pulling radiating pain into her right groin and anterior thigh without extension below the knee. Her symptoms are worse with sitting for prolonged periods of time or walking for greater than 10 to 15 minutes and improves with laying flat on her back with her knees up. She states that this feels similar to the pain that she was having prior to her lumbar laminectomy in 2021. She did initially go back to [name] after onset of the symptoms but was told there is nothing they can do. She states that she has not had any recent MRI. She did undergo a couple weeks of physical therapy but was unable to complete 6 weeks due to pain. She denies any similar left-sided symptoms. Of note she admits to about 1/2 pack per day of smoking     Review of Systems:  A  10 point review of systems is negative, except for the pertinent positives and negatives detailed in the HPI.  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   Pneumonia    PTSD (post-traumatic stress disorder)    Smoker    Thyroid disease     Past Surgical History: Past Surgical History:  Procedure Laterality Date   ABDOMINAL HYSTERECTOMY  12/03/2011   complete   ADENOIDECTOMY, TONSILLECTOMY AND MYRINGOTOMY WITH TUBE PLACEMENT  1977   CARPAL TUNNEL RELEASE Right 03/12/2022  Procedure: RIGHT CARPAL TUNNEL RELEASE;  Surgeon: Marlyne Beards, MD;  Location: [facility];  Service: Orthopedics;  Laterality: Right;   CARPAL TUNNEL RELEASE Left 04/16/2022   Procedure: LEFT CARPAL TUNNEL RELEASE;  Surgeon: Marlyne Beards, MD;  Location: [facility];  Service: Orthopedics;  Laterality: Left;   CHOLECYSTECTOMY  06/08/2011   COLONOSCOPY WITH PROPOFOL N/A 02/05/2022   Procedure: COLONOSCOPY WITH PROPOFOL;  Surgeon: Wyline Mood, MD;  Location: [facility];  Service: Gastroenterology;  Laterality: N/A;   ESOPHAGOGASTRODUODENOSCOPY N/A 02/05/2022   Procedure: ESOPHAGOGASTRODUODENOSCOPY (EGD);  Surgeon: Wyline Mood, MD;  Location: [facility];  Service: Gastroenterology;  Laterality: N/A;   HIP ARTHROSCOPY Right 08/19/2022   Procedure: Right hip arthroscopy, acetabuloplasty, labral repair, femoral osteochondroplasty, capsular closure;  Surgeon: Signa Kell, MD;  Location: [facility];  Service: Orthopedics;  Laterality: Right;   KNEE ARTHROSCOPY Right 1993   KNEE ARTHROSCOPY Left 1998   LUMBAR LAMINECTOMY Left  2021   TOTAL HIP ARTHROPLASTY Right 03/09/2023   Procedure: TOTAL HIP ARTHROPLASTY ANTERIOR APPROACH;  Surgeon: Reinaldo Berber, MD;  Location: [facility];  Service: Orthopedics;  Laterality: Right;    Allergies: Allergies as of 11/17/2023 - Review Complete 11/17/2023  Allergen Reaction Noted   Dust mite mixed allergen ext [mite (d. farinae)]  08/01/2020   Nsaids  12/31/2022   Other Hives 08/01/2020   Sulfa antibiotics Hives 06/24/2020   Latex Itching 08/12/2022   Misc. sulfonamide containing compounds Hives 05/05/2022    Medications: Current Meds  Medication Sig   albuterol (VENTOLIN HFA) 108 (90 Base) MCG/ACT inhaler Inhale 1-2 puffs into the lungs every 6 (six) hours as needed for wheezing or shortness of breath.   ALPRAZolam (XANAX) 0.25 MG tablet Take 1-2 tablets (0.25-0.5 mg total) by mouth 2 (two) times daily as needed for anxiety.   atorvastatin (LIPITOR) 20 MG tablet Take 1 tablet (20 mg total) by mouth daily.   cyanocobalamin (VITAMIN B12) 1000 MCG/ML injection Inject 1 mL (1,000 mcg total) into the muscle every 30 (thirty) days.   docusate sodium (COLACE) 100 MG capsule Take 100 mg by mouth daily.   levothyroxine (SYNTHROID) 50 MCG tablet TAKE 1 TABLET BY MOUTH DAILY BEFORE BREAKFAST.   loratadine (CLARITIN) 10 MG tablet Take 10 mg by mouth daily as needed.   metFORMIN (GLUCOPHAGE) 1000 MG tablet TAKE 1 TABLET BY MOUTH TWICE A DAY WITH FOOD   methocarbamol (ROBAXIN) 500 MG tablet Take 500 mg by mouth 3 (three) times daily.   ondansetron (ZOFRAN) 4 MG tablet Take 1 tablet (4 mg total) by mouth every 6 (six) hours as needed for nausea.   oxybutynin (DITROPAN XL) 15 MG 24 hr tablet Take 1 tablet (15 mg total) by mouth at bedtime.   oxyCODONE (OXY IR/ROXICODONE) 5 MG immediate release tablet Take 0.5-1 tablets (2.5-5 mg total) by mouth every 6 (six) hours as needed for severe pain or breakthrough pain.   pantoprazole (PROTONIX) 40 MG tablet Take 1 tablet (40 mg total) by mouth  daily.   QUEtiapine (SEROQUEL) 300 MG tablet TAKE 1 TABLET BY MOUTH EVERYDAY AT BEDTIME   rOPINIRole (REQUIP) 0.25 MG tablet TAKE 1 TABLET BY MOUTH 3 TIMES DAILY.   sertraline (ZOLOFT) 100 MG tablet TAKE 1 TABLET BY MOUTH EVERY DAY   Syringe/Needle, Disp, (SYRINGE 3CC/25GX1") 25G X 1" 3 ML MISC 1 Syringe by Does not apply route every 30 (thirty) days.   zolpidem (AMBIEN) 5 MG tablet TAKE 1 TABLET BY MOUTH EVERY DAY AT BEDTIME AS NEEDED  FOR SLEEP    Social History: Social History   Tobacco Use   Smoking status: Former    Current packs/day: 0.00    Average packs/day: 0.5 packs/day for 42.0 years (21.0 ttl pk-yrs)    Types: Cigarettes    Start date: 01/25/1981    Quit date: 01/26/2023    Years since quitting: 0.8   Smokeless tobacco: Never  Vaping Use   Vaping status: Former   Substances: Nicotine  Substance Use Topics   Alcohol use: Not Currently   Drug use: Not Currently    Family Medical History: Family History  Problem Relation Age of Onset   Hypertension Mother    Thyroid disease Mother    Lung cancer Father 61       carcinoid, right lung   Ulcerative colitis Father    Breast cancer Paternal Aunt        x3 aunts   Glaucoma Maternal Grandmother    Renal Disease Maternal Grandfather    Diabetes Maternal Grandfather        Type2   Stroke Paternal Grandfather    Hypertension Paternal Grandfather     Physical Examination:   Medical Decision Making  Imaging: MRI L spine 02/21/2022 Disc levels:   T11-T12: No significant spinal canal or neural foraminal narrowing.   T12-L1: No significant spinal canal or neural foraminal narrowing.   L1-L2: Minimal disc bulging.  No significant stenosis.   L2-L3: Mild disc bulging, ligamentum flavum hypertrophy and bowel facet arthropathy. There is mild spinal canal stenosis and minimal bilateral neural foraminal narrowing.   L3-L4: Mild bilateral facet arthropathy. No significant spinal canal or neural foraminal stenosis.    L4-L5: Asymmetric left disc bulging, ligament flavum hypertrophy mild facet arthropathy. There is mild spinal canal stenosis, severe left and moderate right neural foraminal stenosis.   L5-S1: No significant spinal canal or neural foraminal narrowing.   IMPRESSION: Multilevel degenerative changes the lumbar spine, worst at L4-L5 where there is asymmetric left disc bulging and facet arthropathy resulting in severe left-sided neural foraminal stenosis and exiting nerve root impingement. Mild spinal canal and moderate right neural foraminal stenosis at this level.   Milder degenerative changes from L1 through L4 as described above.     Electronically Signed   By: Caprice Renshaw M.D.   On: 02/21/2022 09:06  MRI CTL spine 09/01/2023 IMPRESSION: 1. New focus of increased T2 signal within the right hemicord at T4-T5, just below where a prominent central disc protrusion flattens the right ventral cord. 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 2. Otherwise unchanged multilevel thoracic spondylosis as described above. Unchanged mild spinal canal stenosis at T7-T8. 3. Unchanged trace bilateral pleural effusions.     Electronically Signed   By: Obie Dredge M.D.   On: 09/23/2023 08:50 FINDINGS: Alignment: Straightening of the normal cervical lordosis. No significant listhesis.   Vertebrae: No fracture, evidence of discitis, or bone lesion.   Cord: Normal signal and morphology.   Posterior Fossa, vertebral arteries, paraspinal tissues: Negative.   Disc levels:   C2-C3:  No significant disc bulge or herniation.  No stenosis.   C3-C4:  No significant disc bulge or herniation.  No stenosis.   C4-C5: No significant disc bulge or herniation. Moderate right and mild left facet uncovertebral hypertrophy. Moderate right neuroforaminal stenosis. No spinal canal or left neuroforaminal stenosis.   C5-C6: No significant disc bulge or herniation. Mild bilateral  facet uncovertebral hypertrophy. No stenosis.   C6-C7: Mild disc bulging and bilateral uncovertebral hypertrophy. No  stenosis.   C7-T1: Negative disc. Moderate right facet arthropathy. No stenosis.   IMPRESSION: 1. Multilevel cervical spondylosis as described above. Moderate right neuroforaminal stenosis at C4-C5.     Electronically Signed   By: Obie Dredge M.D.    On: 09/23/2023 08:55 L4-L5: Postsurgical change. Unchanged mild-to-moderate disc bulging with superimposed small left foraminal disc protrusion. Unchanged moderate bilateral facet arthropathy. Unchanged mild bilateral lateral recess stenosis. Unchanged mild right and moderate to severe left neuroforaminal stenosis. No spinal canal stenosis.   L5-S1: Negative disc. Unchanged moderate bilateral facet arthropathy. No stenosis.   IMPRESSION: 1. Unchanged multilevel lumbar spondylosis as described above. Unchanged moderate to severe left neuroforaminal stenosis at L4-L5.     Electronically Signed   By: Obie Dredge M.D.   On: 09/23/2023 09:03   I have personally reviewed the images and agree with the above interpretation.  Assessment and Plan: Ms. Camporeale is a pleasant 53 y.o. female with back pain with right leg pain concerning for sciatica.  She has chronic pain syndrome.  She continues to have right leg symptoms.  I have recommended that she consider spinal cord stimulator evaluation.  She is seeing Dr. Cherylann Ratel and pain management in 2 weeks.    If she has a positive trial, I will be happy to see her back for permanent placement.  I spent a total of 4 minutes in this patient's care today. This time was spent reviewing pertinent records including imaging studies, obtaining and confirming history, performing a directed evaluation, formulating and discussing my recommendations, and documenting the visit within the medical record.        Nithila Sumners K. Myer Haff MD, [facility] Neurosurgery

## 2023-11-18 ENCOUNTER — Other Ambulatory Visit: Payer: Self-pay | Admitting: Internal Medicine

## 2023-11-18 DIAGNOSIS — F319 Bipolar disorder, unspecified: Secondary | ICD-10-CM

## 2023-11-18 DIAGNOSIS — G4700 Insomnia, unspecified: Secondary | ICD-10-CM

## 2023-11-18 MED ORDER — QUETIAPINE FUMARATE 300 MG PO TABS: 300.0000 mg | ORAL_TABLET | Freq: Every day | ORAL | 1 refills | Status: DC

## 2023-11-18 NOTE — Telephone Encounter (Signed)
I have filled out and placed in quick sign folder.

## 2023-11-26 ENCOUNTER — Inpatient Hospital Stay
Admission: RE | Admit: 2023-11-26 | Discharge: 2023-11-26 | Disposition: A | Discharge status: Home / Self Care | Payer: Self-pay | Source: Ambulatory Visit | Attending: Internal Medicine | Admitting: Internal Medicine

## 2023-11-26 ENCOUNTER — Other Ambulatory Visit: Payer: Self-pay | Admitting: *Deleted

## 2023-11-26 DIAGNOSIS — Z1231 Encounter for screening mammogram for malignant neoplasm of breast: Secondary | ICD-10-CM

## 2023-11-30 ENCOUNTER — Other Ambulatory Visit: Payer: Self-pay | Admitting: Internal Medicine

## 2023-11-30 DIAGNOSIS — F319 Bipolar disorder, unspecified: Secondary | ICD-10-CM

## 2023-11-30 DIAGNOSIS — G4700 Insomnia, unspecified: Secondary | ICD-10-CM

## 2023-12-01 ENCOUNTER — Ambulatory Visit
Admission: RE | Admit: 2023-12-01 | Discharge: 2023-12-01 | Disposition: A | Discharge status: Home / Self Care | Payer: No Typology Code available for payment source | Source: Ambulatory Visit | Attending: Student in an Organized Health Care Education/Training Program | Admitting: Student in an Organized Health Care Education/Training Program

## 2023-12-01 ENCOUNTER — Encounter: Payer: Self-pay | Admitting: Student in an Organized Health Care Education/Training Program

## 2023-12-01 ENCOUNTER — Ambulatory Visit
Payer: 59 | Attending: Student in an Organized Health Care Education/Training Program | Admitting: Student in an Organized Health Care Education/Training Program

## 2023-12-01 VITALS — BP 105/77 | HR 72 | Temp 97.2°F | Resp 16 | Ht 63.5 in | Wt 158.0 lb

## 2023-12-01 DIAGNOSIS — G8929 Other chronic pain: Secondary | ICD-10-CM | POA: Insufficient documentation

## 2023-12-01 DIAGNOSIS — M961 Postlaminectomy syndrome, not elsewhere classified: Secondary | ICD-10-CM

## 2023-12-01 DIAGNOSIS — M5416 Radiculopathy, lumbar region: Secondary | ICD-10-CM

## 2023-12-01 DIAGNOSIS — G894 Chronic pain syndrome: Secondary | ICD-10-CM

## 2023-12-01 NOTE — Progress Notes (Signed)
 Safety precautions to be maintained throughout the outpatient stay will include: orient to surroundings, keep bed in low position, maintain call bell within reach at all times, provide assistance with transfer out of bed and ambulation.

## 2023-12-01 NOTE — Patient Instructions (Addendum)
 Spinal cord stimulator trial Soap given shower the night before.    ______________________________________________________________________    Preparing for your procedure  Appointments: If you think you may not be able to keep your appointment, call 24-48 hours in advance to cancel. We need time to make it available to others.  Procedure visits are for procedures only. During your procedure appointment there will be: NO Prescription Refills*. NO medication changes or discussions*. NO discussion of disability issues*. NO unrelated pain problem evaluations*. NO evaluations to order other pain procedures*. *These will be addressed at a separate and distinct evaluation encounter on the provider's evaluation schedule and not during procedure days.  Instructions: Food intake: Avoid eating anything solid for at least 8 hours prior to your procedure. Clear liquid intake: You may take clear liquids such as water  up to 2 hours prior to your procedure. (No carbonated drinks. No soda.) Transportation: Unless otherwise stated by your physician, bring a driver. (Driver cannot be a Market Researcher, Pharmacist, Community, or any other form of public transportation.) Morning Medicines: Except for blood thinners, take all of your other morning medications with a sip of water . Make sure to take your heart and blood pressure medicines. If your blood pressure's lower number is above 100, the case will be rescheduled. Blood thinners: Make sure to stop your blood thinners as instructed.  If you take a blood thinner, but were not instructed to stop it, call our office [phone] and ask to talk to a nurse. Not stopping a blood thinner prior to certain procedures could lead to serious complications. Diabetics on insulin : Notify the staff so that you can be scheduled 1st case in the morning. If your diabetes requires high dose insulin , take only  of your normal insulin  dose the morning of the procedure and notify the staff that you have  done so. Preventing infections: Shower with an antibacterial soap the morning of your procedure.  Build-up your immune system: Take 1000 mg of Vitamin C with every meal (3 times a day) the day prior to your procedure. Antibiotics: Inform the nursing staff if you are taking any antibiotics or if you have any conditions that may require antibiotics prior to procedures. (Example: recent joint implants)   Pregnancy: If you are pregnant make sure to notify the nursing staff. Not doing so may result in injury to the fetus, including death.  Sickness: If you have a cold, fever, or any active infections, call and cancel or reschedule your procedure. Receiving steroids while having an infection may result in complications. Arrival: You must be in the facility at least 30 minutes prior to your scheduled procedure. Tardiness: Your scheduled time is also the cutoff time. If you do not arrive at least 15 minutes prior to your procedure, you will be rescheduled.  Children: Do not bring any children with you. Make arrangements to keep them home. Dress appropriately: There is always a possibility that your clothing may get soiled. Avoid long dresses. Valuables: Do not bring any jewelry or valuables.  Reasons to call and reschedule or cancel your procedure: (Following these recommendations will minimize the risk of a serious complication.) Surgeries: Avoid having procedures within 2 weeks of any surgery. (Avoid for 2 weeks before or after any surgery). Flu Shots: Avoid having procedures within 2 weeks of a flu shots or . (Avoid for 2 weeks before or after immunizations). Barium: Avoid having a procedure within 7-10 days after having had a radiological study involving the use of radiological contrast. (Myelograms, Barium swallow  or enema study). Heart attacks: Avoid any elective procedures or surgeries for the initial 6 months after a Myocardial Infarction (Heart Attack). Blood thinners: It is imperative that you  stop these medications before procedures. Let us  know if you if you take any blood thinner.  Infection: Avoid procedures during or within two weeks of an infection (including chest colds or gastrointestinal problems). Symptoms associated with infections include: Localized redness, fever, chills, night sweats or profuse sweating, burning sensation when voiding, cough, congestion, stuffiness, runny nose, sore throat, diarrhea, nausea, vomiting, cold or Flu symptoms, recent or current infections. It is specially important if the infection is over the area that we intend to treat. Heart and lung problems: Symptoms that may suggest an active cardiopulmonary problem include: cough, chest pain, breathing difficulties or shortness of breath, dizziness, ankle swelling, uncontrolled high or unusually low blood pressure, and/or palpitations. If you are experiencing any of these symptoms, cancel your procedure and contact your primary care physician for an evaluation.  Remember:  Regular Business hours are:  Monday to Thursday 8:00 AM to 4:00 PM  Provider's Schedule: Eric Como, MD:  Procedure days: Tuesday and Thursday 7:30 AM to 4:00 PM  Wallie Sherry, MD:  Procedure days: Monday and Wednesday 7:30 AM to 4:00 PM Last  Updated: 10/06/2023 ______________________________________________________________________

## 2023-12-01 NOTE — Progress Notes (Signed)
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 Harari, MD;  Location: [facility];  Service: Orthopedics;  Laterality: Right;   CARPAL TUNNEL RELEASE Left 04/16/2022   Procedure: LEFT CARPAL TUNNEL RELEASE;  Surgeon: Romona Harari, MD;  Location: [facility];  Service: Orthopedics;  Laterality: Left;   CHOLECYSTECTOMY  06/08/2011   COLONOSCOPY WITH PROPOFOL  N/A 02/05/2022   Procedure: COLONOSCOPY WITH PROPOFOL ;  Surgeon: Therisa Bi, MD;  Location: [facility] ENDOSCOPY;  Service: Gastroenterology;  Laterality: N/A;   ESOPHAGOGASTRODUODENOSCOPY N/A 02/05/2022   Procedure: ESOPHAGOGASTRODUODENOSCOPY (EGD);  Surgeon: Therisa Bi, MD;  Location: [facility] ENDOSCOPY;  Service: Gastroenterology;  Laterality: N/A;   HIP  ARTHROSCOPY Right 08/19/2022   Procedure: Right hip arthroscopy, acetabuloplasty, labral repair, femoral osteochondroplasty, capsular closure;  Surgeon: Tobie Priest, MD;  Location: [facility];  Service: Orthopedics;  Laterality: Right;   KNEE ARTHROSCOPY Right 1993   KNEE ARTHROSCOPY Left 1998   LUMBAR LAMINECTOMY Left 2021   TOTAL HIP ARTHROPLASTY Right 03/09/2023   Procedure: TOTAL HIP ARTHROPLASTY ANTERIOR APPROACH;  Surgeon: Lorelle Hussar, MD;  Location: [facility];  Service: Orthopedics;  Laterality: Right;   Active Ambulatory Problems    Diagnosis Date Noted   Chronic midline low back pain 08/05/2020   Overweight (BMI 25.0-29.9) 08/05/2020   Leukocytosis 01/21/2021   Insomnia 01/21/2021   Preoperative evaluation to rule out surgical contraindication 02/04/2021   Gastritis 03/06/2021   Bipolar disorder, currently in remission (HCC) 07/21/2021   Controlled diabetes mellitus type 2 with complications (HCC) 07/21/2021   Carpal tunnel syndrome, right upper limb 11/02/2021   Situational anxiety 11/21/2021   Vitamin D  deficiency 11/21/2021   Hypothyroidism 11/21/2021   Smoker 04/02/2022   Tobacco abuse 04/02/2022   Long-term use of high-risk medication 04/02/2022   Contact with and (suspected) exposure to covid-19 04/02/2022   Carpal tunnel syndrome on both sides    Trigger finger, left middle finger 06/20/2022   S/P TAH (total abdominal hysterectomy) 06/27/2022   Femoroacetabular impingement of right hip 08/19/2022   Polyarthritis 12/31/2022   Status post THR (total hip replacement) 03/09/2023   Hospital discharge follow-up 03/18/2023   GERD without esophagitis  04/23/2023   Depression 04/23/2023   Type 2 diabetes mellitus without complications (HCC) 04/23/2023   Restless legs syndrome 07/08/2023   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 09/20/2023   History of acute renal failure 09/20/2023   Resolved Ambulatory Problems    Diagnosis Date Noted   Injury of  back 08/05/2020   Otitis externa, fungal, both ears 01/21/2021   Fatigue 01/21/2021   Antibiotic-induced yeast infection 02/04/2021   Numbness of fingers of both hands 08/20/2021   Tick bite of right lower leg 09/09/2021   Skin lumps- left upper back  09/09/2021   Open wound of right index finger due to cat bite 11/02/2021   Nausea 11/21/2021   Pain of right thumb 11/21/2021   Back pain of lumbar region with sciatica 11/21/2021   History of lumbar surgery 11/21/2021   Sore throat 01/01/2022   Acute cough 01/01/2022   Bacterial upper respiratory infection 01/01/2022   Pleural effusion 01/09/2022   Wheezing on expiration 01/09/2022   Lactic acid blood increased 01/09/2022   Acute right flank pain 01/09/2022   Right lower quadrant abdominal pain 01/09/2022   Right upper quadrant abdominal pain 01/09/2022   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   Cat bite 01/14/2022   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   Acute pharyngitis 04/02/2022   Laceration of right thigh 05/21/2022   Urinary frequency 06/28/2022   Acute renal failure (ARF) (HCC) 12/31/2022   Community acquired bilateral lower lobe pneumonia 04/23/2023   Acute metabolic encephalopathy 04/23/2023   Hypoxemia 04/23/2023   Past Medical History:  Diagnosis Date   Allergy    Anxiety    Arthritis    Asthma    Bipolar depression (HCC)    Carpal tunnel syndrome    Diabetes mellitus without complication (HCC)    GERD (gastroesophageal reflux disease)    Hypertension    Pneumonia    PTSD (post-traumatic stress disorder)    Thyroid  disease    Constitutional Exam  General appearance: Well nourished, well developed, and well hydrated. In no apparent acute distress Vitals:   12/01/23 0915  BP: 105/77  Pulse: 72  Resp: 16  Temp: (!) 97.2 F (36.2 C)  SpO2: 98%  Weight: 158 lb (71.7 kg)  Height: 5' 3.5 (1.613 m)   BMI Assessment: Estimated body mass index is 27.55 kg/m as calculated from the following:   Height as  of this encounter: 5' 3.5 (1.613 m).   Weight as of this encounter: 158 lb (71.7 kg).  BMI interpretation table: BMI level Category Range association with higher incidence of chronic pain  <18 kg/m2 Underweight   18.5-24.9 kg/m2 Ideal body weight   25-29.9 kg/m2 Overweight Increased incidence by 20%  30-34.9 kg/m2 Obese (Class I) Increased incidence by 68%  35-39.9 kg/m2 Severe obesity (Class II) Increased incidence by 136%  >40 kg/m2 Extreme obesity (Class III) Increased incidence by 254%   Patient's current BMI Ideal Body weight  Body mass index is 27.55 kg/m. Ideal body weight: 53.5 kg (118 lb 0.9 oz) Adjusted ideal body weight: 60.8 kg (134 lb 0.5 oz)   BMI Readings from Last 4 Encounters:  12/01/23 27.55 kg/m  11/17/23 29.58 kg/m  10/06/23 29.58 kg/m  09/18/23 29.65 kg/m   Wt Readings from Last 4 Encounters:  12/01/23 158 lb (71.7 kg)  11/17/23 167 lb (75.8 kg)  10/06/23 167 lb (75.8 kg)  09/18/23 167 lb 6.4 oz (75.9 kg)    Psych/Mental status: Alert, oriented x 3 (person, place, &  time)       Eyes: PERLA Respiratory: No evidence of acute respiratory distress  Thoracic Spine Area Exam  Skin & Axial Inspection: No masses, redness, or swelling Alignment: Symmetrical Functional ROM: Unrestricted ROM Stability: No instability detected Muscle Tone/Strength: Functionally intact. No obvious neuro-muscular anomalies detected. Sensory (Neurological): Unimpaired Muscle strength & Tone: No palpable anomalies Lumbar Spine Area Exam  Skin & Axial Inspection: Well healed scar from previous spine surgery detected Alignment: Symmetrical Functional ROM: Pain restricted ROM       Stability: No instability detected Muscle Tone/Strength: Functionally intact. No obvious neuro-muscular anomalies detected. Sensory (Neurological): Dermatomal pain pattern Palpation: No palpable anomalies       Provocative Tests: Hyperextension/rotation test: deferred today       Lumbar quadrant  test (Kemp's test): (+) bilateral for foraminal stenosis  Gait & Posture Assessment  Ambulation: Unassisted Gait: Relatively normal for age and body habitus Posture: WNL  Lower Extremity Exam    Side: Right lower extremity  Side: Left lower extremity  Stability: No instability observed          Stability: No instability observed          Skin & Extremity Inspection: Skin color, temperature, and hair growth are WNL. No peripheral edema or cyanosis. No masses, redness, swelling, asymmetry, or associated skin lesions. No contractures.  Skin & Extremity Inspection: Skin color, temperature, and hair growth are WNL. No peripheral edema or cyanosis. No masses, redness, swelling, asymmetry, or associated skin lesions. No contractures.  Functional ROM: Unrestricted ROM                  Functional ROM: Unrestricted ROM                  Muscle Tone/Strength: Functionally intact. No obvious neuro-muscular anomalies detected.  Muscle Tone/Strength: Functionally intact. No obvious neuro-muscular anomalies detected.  Sensory (Neurological): Unimpaired        Sensory (Neurological): Unimpaired        DTR: Patellar: deferred today Achilles: deferred today Plantar: deferred today  DTR: Patellar: deferred today Achilles: deferred today Plantar: deferred today  Palpation: No palpable anomalies  Palpation: No palpable anomalies    Assessment  Primary Diagnosis & Pertinent Problem List: The primary encounter diagnosis was Failed back surgical syndrome. Diagnoses of Lumbar post-laminectomy syndrome, Chronic radicular lumbar pain, and Chronic pain syndrome were also pertinent to this visit.  Visit Diagnosis (New problems to examiner): 1. Failed back surgical syndrome   2. Lumbar post-laminectomy syndrome   3. Chronic radicular lumbar pain   4. Chronic pain syndrome    Plan of Care (Initial workup plan)      Chronic Lumbar Radiculopathy. Lumbar Postlaminectomy Pain Syndrome  Chronic lumbar  radiculopathy persists with right leg symptoms more severe than the left, even after an L4-L5 discectomy in 2021. Previous facet and epidural injections were ineffective. A spinal cord stimulator trial is considered due to ongoing pain despite conservative measures.   I explained to pt how a spinal cord stimulation (SCS) is indicated for chronic, intractable pain, especially in cases of failed back surgery syndrome, radiculopathy, or pain syndromes that affect broader areas such as the back and legs.  SCS devices provide continuous electrical impulses to the spinal cord, modulating pain signals before they reach the brain.  We discussed the potential benefits and risks of spinal cord stimulation. Benefits: can provide substantial relief from chronic pain, long-term therapy with programmable settings, often reduces the need for oral medications, including opioids,  some patients experience reduced dependency on other pain interventions. Risks (including but not limited to): Surgical risks, including infection, lead migration/fracture, and hardware malfunction, long-term implantation requires ongoing maintenance and possible reoperation, variable effectiveness: some patients do not experience sufficient pain relief.  I discussed  percutaneous spinal cord stimulator trial with the patient in detail. I explained to the patient that they will have an external power source and programmer which the patient will use for 7 days. There will be daily communication with the stimulator company and the patient. A possible need for a mid-trial clinic visit to give the patient the best chance of success.   Patient has cleared thorough psychosocial behavioral evaluation.   Some of patient's pain does seem to be mechanical in nature, with some component of neurogenic pain as well. We discussed the indications for spinal cord stimulation, specifically stating that it is typically better for neuropathic and appendicular pain,  but that we have had some success in the treatment of low back and hip pain.   Patient is interested in proceeding with spinal cord stimulation trial. She understands that this may not be successful, and that spinal cord stimulation in general is not a magic bullet.   We had a lengthy and very detailed discussion of all the risks, benefits, alternatives, and rationale of surgery as well as the option of continuing nonsurgical therapies. .  She  told me that all of her questions were answered thoroughly and to her satisfaction. Confidence and understanding of the discussed risks and consequences of  treatment was expressed and she accepted these risks and was eager to proceed with procedure.   Issues concerning treatment and diagnosis were discussed with the patient. There are no barriers to understanding the plan of treatment. Explanation was well received by patient and/or family who then verbalized understanding.    Osteoarthritis   Osteoarthritis with previous bilateral knee scopes in the 1990s and a right hip scope. No specific management changes are discussed.  Carpal Tunnel Syndrome   Bilateral carpal tunnel syndrome with release surgeries in 2023. No specific management changes are discussed.    Imaging Orders         DG PAIN CLINIC C-ARM 1-60 MIN NO REPORT     Procedure Orders         [facility] TRIAL     Provider-requested follow-up: Return for [employer] SCS trial.  Future Appointments  Date Time Provider Department Center  12/08/2023  2:30 PM Clois Fret, MD [facility] None  01/29/2024 12:45 PM [facility] None  01/29/2024  1:00 PM Melanee Annah BROCKS, MD [facility] None    Duration of encounter: .  Total time on encounter, as per AMA guidelines included both the face-to-face and non-face-to-face time personally spent by the physician and/or other qualified health care professional(s) on the day of the encounter (includes time in activities that require the physician  or other qualified health care professional and does not include time in activities normally performed by clinical staff). Physician's time may include the following activities when performed: Preparing to see the patient (e.g., pre-charting review of records, searching for previously ordered imaging, lab work, and nerve conduction tests) Review of prior analgesic pharmacotherapies. Reviewing PMP Interpreting ordered tests (e.g., lab work, imaging, nerve conduction tests) Performing post-procedure evaluations, including interpretation of diagnostic procedures Obtaining and/or reviewing separately obtained history Performing a medically appropriate examination and/or evaluation Counseling and educating the patient/family/caregiver Ordering medications, tests, or procedures Referring and communicating with other health care professionals (  when not separately reported) Documenting clinical information in the electronic or other health record Independently interpreting results (not separately reported) and communicating results to the patient/ family/caregiver Care coordination (not separately reported)  Note by: Wallie Sherry, MD (AI and TTS technology used. I apologize for any typographical errors that were not detected and corrected.) Date: 12/01/2023; Time: 9:59 AM

## 2023-12-07 ENCOUNTER — Telehealth: Payer: Self-pay

## 2023-12-07 NOTE — Telephone Encounter (Signed)
 I had to set up a P2P for the denial of SCS trial. They are saying psych, interdisciplinary team clinical notes to shown, Oswestry score not shown. I faxed the psych report, your notes and the notes from Dr Jeris Montes. I don't know what else they want. The only option was a Cabin crew.  They would not give me the exact date and time they will call you. It will be tomorrow or Wednesday between 3pm -5pm. I told her to have them call before 4 but evidently that doesn't  guarantee that they will. The reference number on the case is [id number].

## 2023-12-08 ENCOUNTER — Ambulatory Visit: Payer: 59 | Admitting: Neurosurgery

## 2023-12-09 ENCOUNTER — Other Ambulatory Visit: Payer: Self-pay | Admitting: Internal Medicine

## 2023-12-09 DIAGNOSIS — G4700 Insomnia, unspecified: Secondary | ICD-10-CM

## 2023-12-09 DIAGNOSIS — F319 Bipolar disorder, unspecified: Secondary | ICD-10-CM

## 2023-12-10 NOTE — Telephone Encounter (Signed)
Refilled: 06/01/2023 Last OV: 09/18/2023 Next OV: not scheduled

## 2023-12-15 DIAGNOSIS — H18829 Corneal disorder due to contact lens, unspecified eye: Secondary | ICD-10-CM | POA: Diagnosis not present

## 2023-12-17 ENCOUNTER — Ambulatory Visit: Payer: 59 | Admitting: Neurosurgery

## 2023-12-23 ENCOUNTER — Other Ambulatory Visit: Payer: Self-pay | Admitting: Internal Medicine

## 2023-12-23 DIAGNOSIS — G4700 Insomnia, unspecified: Secondary | ICD-10-CM

## 2023-12-23 DIAGNOSIS — F319 Bipolar disorder, unspecified: Secondary | ICD-10-CM

## 2024-01-13 ENCOUNTER — Other Ambulatory Visit: Payer: Self-pay

## 2024-01-13 ENCOUNTER — Encounter: Payer: Self-pay | Admitting: Internal Medicine

## 2024-01-13 ENCOUNTER — Ambulatory Visit
Admission: EM | Admit: 2024-01-13 | Discharge: 2024-01-13 | Disposition: A | Discharge status: Home / Self Care | Attending: Emergency Medicine | Admitting: Emergency Medicine

## 2024-01-13 ENCOUNTER — Encounter: Payer: Self-pay | Admitting: Emergency Medicine

## 2024-01-13 DIAGNOSIS — U071 COVID-19: Secondary | ICD-10-CM | POA: Diagnosis not present

## 2024-01-13 LAB — POC COVID19/FLU A&B COMBO
Covid Antigen, POC: POSITIVE — AB
Influenza A Antigen, POC: NEGATIVE
Influenza B Antigen, POC: NEGATIVE

## 2024-01-13 MED ORDER — PROMETHAZINE-DM 6.25-15 MG/5ML PO SYRP: 5.0000 mL | ORAL_SOLUTION | Freq: Every evening | ORAL | 0 refills | Status: DC | PRN

## 2024-01-13 MED ORDER — BENZONATATE 100 MG PO CAPS: 100.0000 mg | ORAL_CAPSULE | Freq: Three times a day (TID) | ORAL | 0 refills | Status: DC

## 2024-01-13 MED ORDER — PAXLOVID (300/100) 20 X 150 MG & 10 X 100MG PO TBPK: 3.0000 | ORAL_TABLET | Freq: Two times a day (BID) | ORAL | 0 refills | Status: AC

## 2024-01-13 NOTE — ED Provider Notes (Signed)
 Renaldo Fiddler    CSN: [id number] Arrival date & time: 01/13/24  1443      History   Chief Complaint Chief Complaint  Patient presents with   Fever    HPI Kathleen Carr is a 53 y.o. female.   Patient presents for evaluation of fever peaking at 103, nasal congestion, nonproductive cough, sore throat and intermittent headaches present for 2 days.  Altered taste, poor appetite tight but able to tolerate food and liquids.  No known sick contacts but does work at [employer].  Has attempted use of Mucinex and cough drops, History of asthma.   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 09/20/2023   History of acute renal failure 09/20/2023   Restless legs syndrome 07/08/2023   GERD without esophagitis 04/23/2023   Depression 04/23/2023   Type 2 diabetes mellitus without complications (HCC) 04/23/2023   Hospital discharge follow-up 03/18/2023   Status post THR (total hip replacement) 03/09/2023   Polyarthritis 12/31/2022   Femoroacetabular impingement of right hip 08/19/2022   S/P TAH (total abdominal hysterectomy) 06/27/2022   Trigger finger, left middle finger 06/20/2022   Carpal tunnel syndrome on both sides    Smoker 04/02/2022   Tobacco abuse 04/02/2022   Long-term use of high-risk medication 04/02/2022   Contact with and (suspected) exposure to covid-19 04/02/2022   Situational anxiety 11/21/2021   Vitamin D deficiency 11/21/2021    Hypothyroidism 11/21/2021   Carpal tunnel syndrome, right upper limb 11/02/2021   Bipolar disorder, currently in remission (HCC) 07/21/2021   Controlled diabetes mellitus type 2 with complications (HCC) 07/21/2021   Gastritis 03/06/2021   Preoperative evaluation to rule out surgical contraindication 02/04/2021   Leukocytosis 01/21/2021   Insomnia 01/21/2021   Chronic midline low back pain 08/05/2020   Overweight (BMI 25.0-29.9) 08/05/2020    Past Surgical History:  Procedure Laterality Date   ABDOMINAL HYSTERECTOMY  12/03/2011   complete   ADENOIDECTOMY, TONSILLECTOMY AND MYRINGOTOMY WITH TUBE PLACEMENT  1977   CARPAL TUNNEL RELEASE Right 03/12/2022   Procedure: RIGHT CARPAL TUNNEL RELEASE;  Surgeon: Marlyne Beards, MD;  Location: [facility];  Service: Orthopedics;  Laterality: Right;   CARPAL TUNNEL RELEASE Left 04/16/2022   Procedure: LEFT CARPAL TUNNEL RELEASE;  Surgeon: Marlyne Beards, MD;  Location: [facility];  Service: Orthopedics;  Laterality: Left;   CHOLECYSTECTOMY  06/08/2011   COLONOSCOPY WITH PROPOFOL N/A 02/05/2022   Procedure: COLONOSCOPY WITH PROPOFOL;  Surgeon: Wyline Mood, MD;  Location: [facility];  Service: Gastroenterology;  Laterality: N/A;   ESOPHAGOGASTRODUODENOSCOPY N/A 02/05/2022   Procedure: ESOPHAGOGASTRODUODENOSCOPY (EGD);  Surgeon: Wyline Mood, MD;  Location: [facility] END[facility]COPY;  Service: Gastroenterology;  Laterality: N/A;   HIP ARTHROSCOPY Right 08/19/2022   Procedure: Right hip arthroscopy, acetabuloplasty, labral repair, femoral osteochondroplasty, capsular closure;  Surgeon: Signa Kell, MD;  Location:  [facility];  Service: Orthopedics;  Laterality: Right;   KNEE ARTHROSCOPY Right 1993   KNEE ARTHROSCOPY Left 1998   LUMBAR LAMINECTOMY Left 2021   TOTAL HIP ARTHROPLASTY Right 03/09/2023   Procedure: TOTAL HIP ARTHROPLASTY ANTERIOR APPROACH;  Surgeon: Reinaldo Berber, MD;  Location: [facility];  Service: Orthopedics;   Laterality: Right;    OB History   No obstetric history on file.      Home Medications    Prior to Admission medications   Medication Sig Start Date End Date Taking? Authorizing Provider  benzonatate (TESSALON) 100 MG capsule Take 1 capsule (100 mg total) by mouth every 8 (eight) hours. 01/13/24  Yes Doriann Zuch R, NP  nirmatrelvir/ritonavir (PAXLOVID, 300/100,) 20 x 150 MG & 10 x 100MG  TBPK Take 3 tablets by mouth 2 (two) times daily for 5 days. Patient GFR is 69 Take nirmatrelvir (150 mg) two tablets twice daily for 5 days and ritonavir (100 mg) one tablet twice daily for 5 days. 01/13/24 01/18/24 Yes Tambria Pfannenstiel R, NP  promethazine-dextromethorphan (PROMETHAZINE-DM) 6.25-15 MG/5ML syrup Take 5 mLs by mouth at bedtime as needed. 01/13/24  Yes Reiley Keisler R, NP  albuterol (VENTOLIN HFA) 108 (90 Base) MCG/ACT inhaler Inhale 1-2 puffs into the lungs every 6 (six) hours as needed for wheezing or shortness of breath.    [provider]  ALPRAZolam (XANAX) 0.25 MG tablet TAKE 1-2 TABLETS (0.25-0.5 MG TOTAL) BY MOUTH 2 (TWO) TIMES DAILY AS NEEDED FOR ANXIETY. 12/10/23   Sherlene Shams, MD  atorvastatin (LIPITOR) 20 MG tablet Take 1 tablet (20 mg total) by mouth daily. 07/12/23   Sherlene Shams, MD  cyanocobalamin (VITAMIN B12) 1000 MCG/ML injection Inject 1 mL (1,000 mcg total) into the muscle every 30 (thirty) days. 06/03/23   Creig Hines, MD  docusate sodium (COLACE) 100 MG capsule Take 100 mg by mouth daily. 09/13/20   [provider]  levothyroxine (SYNTHROID) 50 MCG tablet TAKE 1 TABLET BY MOUTH DAILY BEFORE BREAKFAST. 05/05/23 05/04/24  Sherlene Shams, MD  loratadine (CLARITIN) 10 MG tablet Take 10 mg by mouth daily as needed.    [provider]  metFORMIN (GLUCOPHAGE) 1000 MG tablet TAKE 1 TABLET BY MOUTH TWICE A DAY WITH FOOD 07/29/23   Sherlene Shams, MD  methocarbamol (ROBAXIN) 500 MG tablet Take 500 mg by mouth 3 (three) times daily. 04/20/23   [provider]  ondansetron (ZOFRAN) 4 MG tablet Take 1 tablet (4 mg total) by mouth every 6 (six) hours as needed for nausea. 12/31/22   Sherlene Shams, MD  oxybutynin (DITROPAN XL) 15 MG 24 hr tablet Take 1 tablet (15 mg total) by mouth at bedtime. 05/05/23   Sherlene Shams, MD  oxyCODONE (OXY IR/ROXICODONE) 5 MG immediate release tablet Take 0.5-1 tablets (2.5-5 mg total) by mouth every 6 (six) hours as needed for severe pain or breakthrough pain. 03/10/23   Evon Slack, PA-C  pantoprazole (PROTONIX) 40 MG tablet Take 1 tablet (40 mg total) by mouth daily. 05/05/23   Sherlene Shams, MD  QUEtiapine (SEROQUEL) 300 MG tablet Take 1 tablet (300 mg total) by mouth at bedtime. 11/18/23   Sherlene Shams, MD  rOPINIRole (REQUIP) 0.25 MG tablet TAKE 1 TABLET BY MOUTH 3 TIMES DAILY. 08/03/23   Sherlene Shams, MD  sertraline (ZOLOFT) 100 MG tablet TAKE 1 TABLET BY MOUTH EVERY DAY 11/30/23   Sherlene Shams, MD  Syringe/Needle, Disp, (SYRINGE 3CC/25GX1") 25G X 1"  3 ML MISC 1 Syringe by Does not apply route every 30 (thirty) days. 06/03/23   Creig Hines, MD  zolpidem (AMBIEN) 5 MG tablet TAKE 1 TABLET BY MOUTH EVERY DAY AT BEDTIME AS NEEDED FOR SLEEP 07/14/23   Sherlene Shams, MD  simvastatin (ZOCOR) 40 MG tablet Take 1 tablet (40 mg total) by mouth daily at 6 PM. 06/27/22 07/28/22  Sherlene Shams, MD  sitaGLIPtin (JANUVIA) 100 MG tablet Take 1 tablet (100 mg total) by mouth daily. 08/08/20 01/22/21  Flinchum, Eula Fried, FNP    Family History Family History  Problem Relation Age of Onset   Hypertension Mother    Thyroid disease Mother    Lung cancer Father 30       carcinoid, right lung   Ulcerative colitis Father    Breast cancer Paternal Aunt        x3 aunts   Glaucoma Maternal Grandmother    Renal Disease Maternal Grandfather    Diabetes Maternal Grandfather        Type2   Stroke Paternal Grandfather    Hypertension Paternal Grandfather     Social History Social History   Tobacco Use    Smoking status: Former    Current packs/day: 0.00    Average packs/day: 0.5 packs/day for 42.0 years (21.0 ttl pk-yrs)    Types: Cigarettes    Start date: 01/25/1981    Quit date: 01/26/2023    Years since quitting: 0.9   Smokeless tobacco: Never  Vaping Use   Vaping status: Former   Substances: Nicotine  Substance Use Topics   Alcohol use: Not Currently   Drug use: Not Currently     Allergies   Dust mite mixed allergen ext [mite (d. farinae)], Nsaids, Other, Sulfa antibiotics, Latex, and Misc. sulfonamide containing compounds   Review of Systems Review of Systems   Physical Exam Triage Vital Signs ED Triage Vitals  Encounter Vitals Group     BP 01/13/24 1502 113/76     Systolic BP Percentile --      Diastolic BP Percentile --      Pulse Rate 01/13/24 1502 73     Resp 01/13/24 1502 18     Temp 01/13/24 1502 98.7 F (37.1 C)     Temp Source 01/13/24 1502 Oral     SpO2 01/13/24 1502 94 %     Weight --      Height --      Head Circumference --      Peak Flow --      Pain Score 01/13/24 1503 0     Pain Loc --      Pain Education --      Exclude from Growth Chart --    No data found.  Updated Vital Signs BP 113/76 (BP Location: Left Arm)   Pulse 73   Temp 98.7 F (37.1 C) (Oral)   Resp 18   SpO2 94%   Visual Acuity Right Eye Distance:   Left Eye Distance:   Bilateral Distance:    Right Eye Near:   Left Eye Near:    Bilateral Near:     Physical Exam Constitutional:      Appearance: Normal appearance.  HENT:     Head: Normocephalic.     Right Ear: Tympanic membrane, ear canal and external ear normal.     Left Ear: Tympanic membrane, ear canal and external ear normal.     Nose: Congestion present.     Mouth/Throat:  Mouth: Mucous membranes are moist.     Pharynx: Oropharynx is clear. Posterior oropharyngeal erythema present.  Eyes:     Extraocular Movements: Extraocular movements intact.  Cardiovascular:     Rate and Rhythm: Normal rate and  regular rhythm.     Pulses: Normal pulses.     Heart sounds: Normal heart sounds.  Pulmonary:     Effort: Pulmonary effort is normal.     Breath sounds: Normal breath sounds.  Musculoskeletal:     Cervical back: Normal range of motion and neck supple.  Neurological:     Mental Status: She is alert and oriented to person, place, and time.      UC Treatments / Results  Labs (all labs ordered are listed, but only abnormal results are displayed) Labs Reviewed  POC COVID19/FLU A&B COMBO - Abnormal; Notable for the following components:      Result Value   Covid Antigen, POC Positive (*)    All other components within normal limits    EKG   Radiology No results found.  Procedures Procedures (including critical care time)  Medications Ordered in UC Medications - No data to display  Initial Impression / Assessment and Plan / UC Course  I have reviewed the triage vital signs and the nursing notes.  Pertinent labs & imaging results that were available during my care of the patient were reviewed by me and considered in my medical decision making (see chart for details).  Covid 19  Patient is in no signs of distress nor toxic appearing.  Vital signs are stable.  Low suspicion for pneumonia, pneumothorax or bronchitis and therefore will defer imaging.  Prescribed Paxlovid, Tessalon and Promethazine DM, declined use of prednisone at this time.  Discussed quarantine per CDC.May use additional over-the-counter medications as needed for supportive care.  May follow-up with urgent care as needed if symptoms persist or worsen.    Final Clinical Impressions(s) / UC Diagnoses   Final diagnoses:  COVID-19     Discharge Instructions      Covid 19 is a virus and should steadily improve in time it can take up to 7 to 10 days before you truly start to see a turnaround however things will get better  Per the CDC will need to quarantine into you without fever for 24 hours.  Then may  continue activity wearing mask  Begin Paxlovid every morning and every evening for 5 days to suppress the virus which in turn will make symptoms more manageable  You may use Tessalon pill every 8 hours as needed for cough, may use cough syrup at bedtime for additional comfort    You can take Tylenol and/or Ibuprofen as needed for fever reduction and pain relief.   For cough: honey 1/2 to 1 teaspoon (you can dilute the honey in water or another fluid).  You can also use guaifenesin and dextromethorphan for cough. You can use a humidifier for chest congestion and cough.  If you don't have a humidifier, you can sit in the bathroom with the hot shower running.      For sore throat: try warm salt water gargles, cepacol lozenges, throat spray, warm tea or water with lemon/honey, popsicles or ice, or OTC cold relief medicine for throat discomfort.   For congestion: take a daily anti-histamine like Zyrtec, Claritin, and a oral decongestant, such as pseudoephedrine.  You can also use Flonase 1-2 sprays in each nostril daily.   It is important to stay hydrated: drink plenty of  fluids (water, gatorade/powerade/pedialyte, juices, or teas) to keep your throat moisturized and help further relieve irritation/discomfort.     ED Prescriptions     Medication Sig Dispense Auth. Provider   benzonatate (TESSALON) 100 MG capsule Take 1 capsule (100 mg total) by mouth every 8 (eight) hours. 21 capsule Ivanka Kirshner R, NP   promethazine-dextromethorphan (PROMETHAZINE-DM) 6.25-15 MG/5ML syrup Take 5 mLs by mouth at bedtime as needed. 118 mL Elayah Klooster R, NP   nirmatrelvir/ritonavir (PAXLOVID, 300/100,) 20 x 150 MG & 10 x 100MG  TBPK Take 3 tablets by mouth 2 (two) times daily for 5 days. Patient GFR is 69 Take nirmatrelvir (150 mg) two tablets twice daily for 5 days and ritonavir (100 mg) one tablet twice daily for 5 days. 30 tablet Valinda Hoar, NP      PDMP not reviewed this encounter.   Valinda Hoar, NP 01/13/24 [phone]

## 2024-01-13 NOTE — Discharge Instructions (Addendum)
 Covid 19 is a virus and should steadily improve in time it can take up to 7 to 10 days before you truly start to see a turnaround however things will get better  Per the CDC will need to quarantine into you without fever for 24 hours.  Then may continue activity wearing mask  Begin Paxlovid every morning and every evening for 5 days to suppress the virus which in turn will make symptoms more manageable  You may use Tessalon pill every 8 hours as needed for cough, may use cough syrup at bedtime for additional comfort    You can take Tylenol and/or Ibuprofen as needed for fever reduction and pain relief.   For cough: honey 1/2 to 1 teaspoon (you can dilute the honey in water or another fluid).  You can also use guaifenesin and dextromethorphan for cough. You can use a humidifier for chest congestion and cough.  If you don't have a humidifier, you can sit in the bathroom with the hot shower running.      For sore throat: try warm salt water gargles, cepacol lozenges, throat spray, warm tea or water with lemon/honey, popsicles or ice, or OTC cold relief medicine for throat discomfort.   For congestion: take a daily anti-histamine like Zyrtec, Claritin, and a oral decongestant, such as pseudoephedrine.  You can also use Flonase 1-2 sprays in each nostril daily.   It is important to stay hydrated: drink plenty of fluids (water, gatorade/powerade/pedialyte, juices, or teas) to keep your throat moisturized and help further relieve irritation/discomfort.

## 2024-01-13 NOTE — ED Triage Notes (Signed)
 Patient presents to [facility] for evaluation of chest congestion, cough and fever x 2 days.  Fatigued.  102 fever at home.  Has been taking Mucinex D and Tylenol.  Patient does have a hx of recurring mono.

## 2024-01-19 ENCOUNTER — Ambulatory Visit: Payer: 59 | Admitting: Neurosurgery

## 2024-01-21 ENCOUNTER — Other Ambulatory Visit: Payer: Self-pay | Admitting: Internal Medicine

## 2024-01-21 DIAGNOSIS — G4700 Insomnia, unspecified: Secondary | ICD-10-CM

## 2024-01-21 DIAGNOSIS — F319 Bipolar disorder, unspecified: Secondary | ICD-10-CM

## 2024-01-22 DIAGNOSIS — F319 Bipolar disorder, unspecified: Secondary | ICD-10-CM

## 2024-01-22 DIAGNOSIS — G4700 Insomnia, unspecified: Secondary | ICD-10-CM

## 2024-01-25 NOTE — Telephone Encounter (Signed)
 Refilled: 07/14/2023 Last OV: 09/18/2023 Next OV: 02/22/2024

## 2024-01-26 ENCOUNTER — Other Ambulatory Visit: Payer: Self-pay | Admitting: Internal Medicine

## 2024-01-26 DIAGNOSIS — F319 Bipolar disorder, unspecified: Secondary | ICD-10-CM

## 2024-01-26 DIAGNOSIS — G4700 Insomnia, unspecified: Secondary | ICD-10-CM

## 2024-01-26 NOTE — Telephone Encounter (Signed)
 Patient was notified via MyChart and states she has a scheduled appt with provider on 04/28.

## 2024-01-29 ENCOUNTER — Inpatient Hospital Stay: Payer: 59

## 2024-01-29 ENCOUNTER — Inpatient Hospital Stay: Payer: 59 | Admitting: Oncology

## 2024-01-30 ENCOUNTER — Other Ambulatory Visit: Payer: Self-pay | Admitting: Internal Medicine

## 2024-02-01 ENCOUNTER — Encounter: Payer: Self-pay | Admitting: Student in an Organized Health Care Education/Training Program

## 2024-02-01 ENCOUNTER — Ambulatory Visit
Payer: 59 | Attending: Student in an Organized Health Care Education/Training Program | Admitting: Student in an Organized Health Care Education/Training Program

## 2024-02-01 ENCOUNTER — Ambulatory Visit
Admission: RE | Admit: 2024-02-01 | Discharge: 2024-02-01 | Disposition: A | Discharge status: Home / Self Care | Source: Ambulatory Visit | Attending: Student in an Organized Health Care Education/Training Program | Admitting: Student in an Organized Health Care Education/Training Program

## 2024-02-01 DIAGNOSIS — M5416 Radiculopathy, lumbar region: Secondary | ICD-10-CM | POA: Diagnosis present

## 2024-02-01 DIAGNOSIS — G8929 Other chronic pain: Secondary | ICD-10-CM | POA: Diagnosis present

## 2024-02-01 DIAGNOSIS — M961 Postlaminectomy syndrome, not elsewhere classified: Secondary | ICD-10-CM | POA: Diagnosis not present

## 2024-02-01 DIAGNOSIS — G894 Chronic pain syndrome: Secondary | ICD-10-CM | POA: Insufficient documentation

## 2024-02-01 MED ORDER — ROPIVACAINE HCL 2 MG/ML IJ SOLN
INTRAMUSCULAR | Status: AC
Start: 2024-02-01 — End: ?
  Filled 2024-02-01: qty 20

## 2024-02-01 MED ORDER — CEFAZOLIN SODIUM 1 G IJ SOLR
INTRAMUSCULAR | Status: AC
Start: 2024-02-01 — End: ?
  Filled 2024-02-01: qty 20

## 2024-02-01 MED ORDER — FENTANYL CITRATE (PF) 100 MCG/2ML IJ SOLN
INTRAMUSCULAR | Status: AC
  Filled 2024-02-01: qty 2

## 2024-02-01 MED ORDER — LIDOCAINE HCL 2 % IJ SOLN
20.0000 mL | Freq: Once | INTRAMUSCULAR | Status: AC
  Administered 2024-02-01: 400 mg

## 2024-02-01 MED ORDER — MIDAZOLAM HCL 5 MG/5ML IJ SOLN
0.5000 mg | Freq: Once | INTRAMUSCULAR | Status: AC
  Administered 2024-02-01: 2.5 mg via INTRAVENOUS

## 2024-02-01 MED ORDER — FENTANYL CITRATE (PF) 100 MCG/2ML IJ SOLN
25.0000 ug | INTRAMUSCULAR | Status: DC | PRN
  Administered 2024-02-01: 100 ug via INTRAVENOUS

## 2024-02-01 MED ORDER — CEPHALEXIN 500 MG PO CAPS: 500.0000 mg | ORAL_CAPSULE | Freq: Four times a day (QID) | ORAL | 0 refills | Status: AC

## 2024-02-01 MED ORDER — CEFAZOLIN SODIUM-DEXTROSE 2-4 GM/100ML-% IV SOLN
2.0000 g | INTRAVENOUS | Status: AC
  Administered 2024-02-01: 2 g via INTRAVENOUS
  Filled 2024-02-01: qty 100

## 2024-02-01 MED ORDER — ROPIVACAINE HCL 2 MG/ML IJ SOLN
9.0000 mL | Freq: Once | INTRAMUSCULAR | Status: AC
  Administered 2024-02-01: 20 mL via PERINEURAL

## 2024-02-01 MED ORDER — LACTATED RINGERS IV SOLN: Freq: Once | INTRAVENOUS | Status: AC

## 2024-02-01 MED ORDER — LIDOCAINE HCL 2 % IJ SOLN
INTRAMUSCULAR | Status: AC
Start: 2024-02-01 — End: ?
  Filled 2024-02-01: qty 20

## 2024-02-01 MED ORDER — MIDAZOLAM HCL 5 MG/5ML IJ SOLN
INTRAMUSCULAR | Status: AC
  Filled 2024-02-01: qty 5

## 2024-02-01 NOTE — Patient Instructions (Signed)
 Today we did the following -We have done a Spinal Cord Stimulator Trial with [employer]  -As long as the leads are in place, do not bathe or shower. You may sponge bathe.  -While the lead is in place, please limit the bending, lifting, or twisting because the lead can move.  -The things we want to see is if your pain improves (and by what percentage), if you can do more activity (don't overdo it), and if you can use less of your "as needed" medicine. Do not stop long acting medicines like methadone, oxycontin, MS Contin, etc without checking with [place].  -It is VERY important that you pick up the antibiotics we prescribed, Keflex, on your way home from the trial and take them as prescribed(4 times a day), starting today, for as long as the lead is in place.  -The Spina Cord Stimulator Representative will be in contact with you while the lead is in place to make sure the trial goes as well as possible.  -Please contact us with any questions or concerns at any time during the trial.   -If you start running a fever over 100 degrees, have severe back pain, or new pain running down the legs, or drainage coming from the lead site, contact us immediately and/or go to the emergency room.  -Please do not restart any sort of medication that can thin your blood such as Aspirin, ibuprofen, motrin, aleve, plavix, coumadin, etc. If you aren't sure, call and ask.  -We will have you return next Mondday to have the lead removed. If this is successful, at that point we can go over the details about the permanent implant.

## 2024-02-01 NOTE — Progress Notes (Signed)
 PROVIDER NOTE: Interpretation of information contained herein should be left to medically-trained personnel. Specific patient instructions are provided elsewhere under "Patient Instructions" section of medical record. This document was created in part using STT-dictation technology, any transcriptional errors that may result from this process are unintentional.  Patient: Kathleen Carr Type: Established DOB: 06-Dec-1970 MRN: [id number] PCP: Sherlene Shams, MD  Service: Procedure DOS: 02/01/2024 Setting: Ambulatory Location: Ambulatory outpatient facility Delivery: Face-to-face Provider: Edward Jolly, MD Specialty: Interventional Pain Management Specialty designation: 09 Location: Outpatient facility Ref. Prov.: Edward Jolly, MD       Interventional Therapy   Primary Reason for Admission: Surgical management of chronic pain condition.   Procedure:              Type: MEDTRONIC Trial Spinal Cord Neurostimulator Implant (Percutaneous, interlaminar, posterior epidural placement) Laterality: Bilateral (-50)  Level: Lumbar  Imaging: Fluoroscopic guidance Anesthesia: Local anesthesia (1-2% Lidocaine) Sedation: Moderate Sedation                       DOS: 02/01/2024  Performed by: Edward Jolly, MD  Purpose: Diagnostic. To determine if a permanent implant may be effective in controlling some or all of Kathleen Carr's chronic pain symptoms.  Rationale (medical necessity): procedure needed and proper for the diagnosis and/or treatment of Kathleen Carr's medical symptoms and needs. 1. Failed back surgical syndrome   2. Lumbar post-laminectomy syndrome   3. Chronic radicular lumbar pain   4. Chronic pain syndrome    NAS-11 Pain score:   Pre-procedure: 7 /10   Post-procedure: 7 /10     Target: Posterior epidural space over the dorsal columns of the spinal cord. Location: Posterior intraspinal canal Region: Thoracolumbar  Approach: Translaminar percutaneous  Type of procedure: Surgical   Position  / Prep / Materials:  Position: Prone  Prep solution: ChloraPrep (2% chlorhexidine gluconate and 70% isopropyl alcohol) Prep Area: Entire  Posterior  Thoracolumbar  Region  Materials:  Tray: Implant tray Needle(s):  Type: Epidural  Gauge (G):  14   Length: Regular (10cm)  Qty: 2  H&P (Pre-op Assessment):  Kathleen Carr is a 53 y.o. (year old), female patient, seen today for interventional treatment. She  has a past surgical history that includes Abdominal hysterectomy (12/03/2011); Adenoidectomy, tonsillectomy and myringotomy with tube placement (1977); Cholecystectomy (06/08/2011); Knee arthroscopy (Right, 1993); Colonoscopy with propofol (N/A, 02/05/2022); Esophagogastroduodenoscopy (N/A, 02/05/2022); Carpal tunnel release (Right, 03/12/2022); Carpal tunnel release (Left, 04/16/2022); Hip arthroscopy (Right, 08/19/2022); Lumbar laminectomy (Left, 2021); Knee arthroscopy (Left, 1998); and Total hip arthroplasty (Right, 03/09/2023).  Initial Vital Signs:  Pulse/EKG Rate: 67ECG Heart Rate: (!) 58 (sb) Temp: (!) 97.3 F (36.3 C) Resp: 18 BP: 104/68 SpO2: 99 %  BMI: Estimated body mass index is 27.28 kg/m as calculated from the following:   Height as of this encounter: 5\' 3"  (1.6 m).   Weight as of this encounter: 154 lb (69.9 kg).  Risk Assessment: Allergies: Reviewed. She is allergic to dust mite mixed allergen ext [mite (d. farinae)], nsaids, other, sulfa antibiotics, latex, and misc. sulfonamide containing compounds.  Allergy Precautions: None required Coagulopathies: Reviewed. None identified.  Blood-thinner therapy: None at this time Active Infection(s): Reviewed. None identified. Kathleen Carr is afebrile  Site Confirmation: Kathleen Carr was asked to confirm the procedure and laterality before marking the site, which she did. Procedure checklist: Completed Consent: Before the procedure and under the influence of no sedative(s), amnesic(s), or anxiolytics, the patient was informed of the  treatment options,  risks and possible complications. To fulfill our ethical and legal obligations, as recommended by the American Medical Association's Code of Ethics, I have informed the patient of my clinical impression; the nature and purpose of the treatment or procedure; the risks, benefits, and possible complications of the intervention; the alternatives, including doing nothing; the risk(s) and benefit(s) of the alternative treatment(s) or procedure(s); and the risk(s) and benefit(s) of doing nothing.  Kathleen Carr was provided with information about the general risks and possible complications associated with most interventional procedures. These include, but are not limited to: failure to achieve desired goals, infection, bleeding, organ or nerve damage, allergic reactions, paralysis, and/or death.  In addition, she was informed of those risks and possible complications associated to this particular procedure, which include, but are not limited to: damage to the implant; failure to decrease pain; local, systemic, or serious CNS infections, intraspinal abscess with possible cord compression and paralysis, or life-threatening such as meningitis; intrathecal and/or epidural bleeding with formation of hematoma with possible spinal cord compression and permanent paralysis; organ damage; nerve injury or damage with subsequent sensory, motor, and/or autonomic system dysfunction, resulting in transient or permanent pain, numbness, and/or weakness of one or several areas of the body; allergic reactions, either minor or major life-threatening, such as anaphylactic or anaphylactoid reactions.  Furthermore, Kathleen Carr was informed of those risks and complications associated with the medications. These include, but are not limited to: allergic reactions (i.e.: anaphylactic or anaphylactoid reactions); arrhythmia;  Hypotension/hypertension; cardiovascular collapse; respiratory depression and/or shortness of breath;  swelling or edema; medication-induced neural toxicity; particulate matter embolism and blood vessel occlusion with resultant organ, and/or nervous system infarction and permanent paralysis.  Finally, she was informed that Medicine is not an exact science; therefore, there is also the possibility of unforeseen or unpredictable risks and/or possible complications that may result in a catastrophic outcome. The patient indicated having understood very clearly. We have given the patient no guarantees and we have made no promises. Enough time was given to the patient to ask questions, all of which were answered to the patient's satisfaction. Kathleen Carr has indicated that she wanted to continue with the procedure. Attestation: I, the ordering provider, attest that I have discussed with the patient the benefits, risks, side-effects, alternatives, likelihood of achieving goals, and potential problems during recovery for the procedure that I have provided informed consent. Date  Time: 02/01/2024  7:47 AM  Pre-Procedure Preparation:  Monitoring: As per clinic protocol. Respiration, ETCO2, SpO2, BP, heart rate and rhythm monitor placed and checked for adequate function Safety Precautions: Patient was assessed for positional comfort and pressure points before starting the procedure. Time-out: I initiated and conducted the "Time-out" before starting the procedure, as per protocol. The patient was asked to participate by confirming the accuracy of the "Time Out" information. Verification of the correct person, site, and procedure were performed and confirmed by me, the nursing staff, and the patient. "Time-out" conducted as per Joint Commission's Universal Protocol (UP.01.01.01). Time: 0842 Start Time: 0842 hrs.  Description/Narrative of Procedure:          Rationale (medical necessity): procedure needed and proper for the diagnosis and/or treatment of the patient's medical symptoms and needs. Procedural Technique  Safety Precautions: Aspiration looking for blood return was conducted prior to all injections. At no point did we inject any substances, as a needle was being advanced. No attempts were made at seeking any paresthesias. Safe injection practices and needle disposal techniques used. Medications properly checked for  expiration dates. SDV (single dose vial) medications used. Description of the Procedure: Protocol guidelines were followed. The patient was assisted into a comfortable position. The target area was identified and the area prepped in the usual manner. Skin & deeper tissues infiltrated with local anesthetic. Appropriate amount of time allowed to pass for local anesthetics to take effect. The procedure needles were then advanced to the target area. Proper needle placement secured. Negative aspiration confirmed. Solution injected in intermittent fashion, asking for systemic symptoms every 0.5cc of injectate. The needles were then removed and the area cleansed, making sure to leave some of the prepping solution back to take advantage of its long term bactericidal properties.  Technical description of procedure: Availability of a responsible, adult driver, and NPO status confirmed. Informed consent was obtained after having discussed risks and possible complications. An IV was started. The patient was then taken to the fluoroscopy suite, where the patient was placed in position for the procedure, over the fluoroscopy table. The patient was then monitored in the usual manner. Fluoroscopy was manipulated to obtain the best possible view of the target. Parallex error was corrected before commencing the procedure. Once a clear view of the target had been obtained, the skin and deeper tissues over the procedure site were infiltrated using lidocaine, loaded in a 10 cc luer-loc syringe with a 0.5 inch, 25-G needle. The introducer needle(s) was/were then inserted through the skin and deeper tissues. A paramidline  approach was used to enter the posterior epidural space at a 30 angle, using "Loss-of-resistance Technique" with 3 ml of PF-NaCl (0.9% NSS). Correct needle placement was confirmed in the antero-posterior and lateral fluoroscopic views. The lead was gently introduced and manipulated under real-time fluoroscopy, constantly assessing for pain, discomfort, or paresthesias, until the tip rested at the desired level. Both sides were done in identical fashion. Electrode placement was tested until appropriate coverage was attained. Once the patient confirmed that the stimulation was over the desired area, the lead(s) was/were secured in place and the introducer needles removed. This was done under real-time fluoroscopy while observing the electrode tip to avoid unintended migration. The area was covered with a non-occlusive dressing and the patient transported to recovery for further programming.      Vitals:   02/01/24 0925 02/01/24 0935 02/01/24 0946 02/01/24 0953  BP: 95/69 103/64 95/68 107/69  Pulse:      Resp: 11 15 15 17   Temp:      SpO2: 93% 96% 98% 98%  Weight:      Height:        Start Time: 0842 hrs. End Time:   hrs.  Neurostimulator Details:  Lead(s):  Brand: Medtronic         Epidural Access Level:  T12-L1 T12-L1  Lead implant:  Bilateral   No. of Electrodes/Lead:  8 8  Laterality:  Left Right  Top electrode location:  T8 T8  Model No.: [id number] Same  Length: 60cm Same   Imaging Guidance (Spinal):          Type of Imaging Technique: Fluoroscopy Guidance (Spinal) Indication(s): Fluoroscopy guidance for needle placement to enhance accuracy in procedures requiring precise needle localization for targeted delivery of medication in or near specific anatomical locations not easily accessible without such real-time imaging assistance. Exposure Time: Please see nurses notes. Contrast: None used. Fluoroscopic Guidance: I was personally present during the use of fluoroscopy. "Tunnel  Vision Technique" used to obtain the best possible view of the target area. Parallax error corrected before  commencing the procedure. "Direction-depth-direction" technique used to introduce the needle under continuous pulsed fluoroscopy. Once target was reached, antero-posterior, oblique, and lateral fluoroscopic projection used confirm needle placement in all planes. Images permanently stored in EMR. Interpretation: No contrast injected. I personally interpreted the imaging intraoperatively. Adequate needle placement confirmed in multiple planes. Permanent images saved into the patient's record.  Antibiotic Prophylaxis:   Anti-infectives (From admission, onward)    Start     Dose/Rate Route Frequency Ordered Stop   02/01/24 0831  ceFAZolin (ANCEF) IVPB 2g/100 mL premix        2 g 200 mL/hr over 30 Minutes Intravenous 30 min pre-op 02/01/24 0831 02/01/24 0837   02/01/24 0000  cephALEXin (KEFLEX) 500 MG capsule        500 mg Oral 4 times daily 02/01/24 0823 02/08/24 2359      Indication(s): Implant Prophylaxis.  Post-operative Assessment:  Post-procedure Vital Signs:  Pulse/HCG Rate: 6761 Temp: (!) 97.3 F (36.3 C) Resp: 17 BP: 107/69 SpO2: 98 %  Complications: No immediate post-treatment complications observed by team, or reported by patient.  Note: The patient tolerated the entire procedure well. A repeat set of vitals were taken after the procedure and the patient was kept under observation following institutional policy, for this type of procedure. Post-procedural neurological assessment was performed, showing return to baseline, prior to discharge. The patient was provided with post-procedure discharge instructions, including a section on how to identify potential problems. Should any problems arise concerning this procedure, the patient was given instructions to immediately contact us, at any time, without hesitation. In any case, we plan to contact the patient by telephone for a  follow-up status report regarding this interventional procedure.  Comments:  No additional relevant information.  Plan of Care  Orders:  Orders Placed This Encounter  Procedures   DG PAIN CLINIC C-ARM 1-60 MIN NO REPORT    Intraoperative interpretation by procedural physician at [facility].    Standing Status:   Standing    Number of Occurrences:   1    Reason for exam::   Assistance in needle guidance and placement for procedures requiring needle placement in or near specific anatomical locations not easily accessible without such assistance.    Medications administered: We administered lidocaine, lactated ringers, midazolam, fentaNYL, ropivacaine (PF) 2 mg/mL (0.2%), and ceFAZolin.  See the medical record for exact dosing, route, and time of administration.  Follow-up plan:   Return in about 1 week (around 02/08/2024) for SCS lead pull.     Recent Visits Date Type Provider Dept  12/01/23 Office Visit Edward Jolly, MD [facility]  Showing recent visits within past 90 days and meeting all other requirements Today's Visits Date Type Provider Dept  02/01/24 Procedure visit Edward Jolly, MD [facility]  Showing today's visits and meeting all other requirements Future Appointments Date Type Provider Dept  02/08/24 Appointment Edward Jolly, MD [facility]  Showing future appointments within next 90 days and meeting all other requirements  Disposition: Discharge home  Discharge (Date  Time): 02/01/2024; 1000 hrs.   Primary Care Physician: Sherlene Shams, MD Location: [facility] Note by: Edward Jolly, MD (TTS technology used. I apologize for any typographical errors that were not detected and corrected.) Date: 02/01/2024; Time: 10:20 AM

## 2024-02-01 NOTE — Progress Notes (Signed)
 Safety precautions to be maintained throughout the outpatient stay will include: orient to surroundings, keep bed in low position, maintain call bell within reach at all times, provide assistance with transfer out of bed and ambulation.

## 2024-02-02 ENCOUNTER — Encounter: Payer: Self-pay | Admitting: Internal Medicine

## 2024-02-02 ENCOUNTER — Ambulatory Visit (INDEPENDENT_AMBULATORY_CARE_PROVIDER_SITE_OTHER): Admitting: Internal Medicine

## 2024-02-02 ENCOUNTER — Ambulatory Visit: Admitting: Student in an Organized Health Care Education/Training Program

## 2024-02-02 ENCOUNTER — Telehealth: Payer: Self-pay | Admitting: *Deleted

## 2024-02-02 VITALS — BP 98/66 | HR 65 | Ht 63.0 in | Wt 170.0 lb

## 2024-02-02 DIAGNOSIS — G4700 Insomnia, unspecified: Secondary | ICD-10-CM

## 2024-02-02 DIAGNOSIS — F319 Bipolar disorder, unspecified: Secondary | ICD-10-CM

## 2024-02-02 DIAGNOSIS — E1169 Type 2 diabetes mellitus with other specified complication: Secondary | ICD-10-CM | POA: Diagnosis not present

## 2024-02-02 DIAGNOSIS — E669 Obesity, unspecified: Secondary | ICD-10-CM

## 2024-02-02 DIAGNOSIS — E785 Hyperlipidemia, unspecified: Secondary | ICD-10-CM | POA: Diagnosis not present

## 2024-02-02 DIAGNOSIS — M545 Low back pain, unspecified: Secondary | ICD-10-CM

## 2024-02-02 DIAGNOSIS — E611 Iron deficiency: Secondary | ICD-10-CM

## 2024-02-02 DIAGNOSIS — E039 Hypothyroidism, unspecified: Secondary | ICD-10-CM | POA: Diagnosis not present

## 2024-02-02 DIAGNOSIS — E119 Type 2 diabetes mellitus without complications: Secondary | ICD-10-CM

## 2024-02-02 DIAGNOSIS — F3170 Bipolar disorder, currently in remission, most recent episode unspecified: Secondary | ICD-10-CM

## 2024-02-02 DIAGNOSIS — L7682 Other postprocedural complications of skin and subcutaneous tissue: Secondary | ICD-10-CM

## 2024-02-02 DIAGNOSIS — G8929 Other chronic pain: Secondary | ICD-10-CM

## 2024-02-02 MED ORDER — LEVOTHYROXINE SODIUM 50 MCG PO TABS: 50.0000 ug | ORAL_TABLET | Freq: Every day | ORAL | 0 refills | Status: DC

## 2024-02-02 MED ORDER — ALPRAZOLAM 0.25 MG PO TABS: 0.2500 mg | ORAL_TABLET | Freq: Every evening | ORAL | 5 refills | Status: DC | PRN

## 2024-02-02 MED ORDER — SERTRALINE HCL 100 MG PO TABS: 100.0000 mg | ORAL_TABLET | Freq: Every day | ORAL | 1 refills | Status: DC

## 2024-02-02 NOTE — Patient Instructions (Addendum)
 If you have not taken ambien in 5 days,  do not resume.    If your current regimen of alprazolam, requip, serqouel and melatonin stops working,   You might want to try using Relaxium for insomnia  (as seen on TV commercials) . It is available through [employer] and contains all natural supplements:  Melatonin 5 mg  Chamomile 25 mg Passionflower extract 75 mg GABA 100 mg Ashwaganda extract 125 mg Magnesium citrate, glycinate, oxide (100 mg)  L tryptophan 500 mg Valerest (proprietary  ingredient ; probably valeria root extract)  Let me know if you would like to try ozempic or mounjaro to manage your weight

## 2024-02-02 NOTE — Telephone Encounter (Signed)
 No problems post procedure.

## 2024-02-02 NOTE — Assessment & Plan Note (Signed)
 Complicated by type  2 DM and hyperliidemia.  Patient has been unable to lose or maintain a healthy weight due to chronic low back pain ,  recent hip surgery.   Screened for contraindications to use of  GLP 1 agonists for appetite suppression and she has none.  The risks and benefits of pharmacotherapy were discussed and she is contemplative

## 2024-02-02 NOTE — Progress Notes (Signed)
 Subjective:  Patient ID: Kathleen Carr, female    DOB: 11/10/1970  Age: 53 y.o. MRN: [id number]  CC: The primary encounter diagnosis was Hypothyroidism, unspecified type. Diagnoses of Bipolar 1 disorder (HCC), Insomnia, unspecified type, Type 2 diabetes mellitus without complication, without long-term current use of insulin (HCC), Hyperlipidemia, unspecified hyperlipidemia type, Iron deficiency, Bleeding at insertion site, Type 2 diabetes mellitus with obesity (HCC), Bipolar disorder, currently in remission [facility]), Chronic midline low back pain, unspecified whether sciatica present, and Morbid obesity (HCC) were also pertinent to this visit.   HPI [name] presents for  Chief Complaint  Patient presents with   Medical Management of Chronic Issues    6 month follow up    1) )postoperative bleeding : had a spinal cord stimulator trial implant done at the  T11-12 level  on April 7 by Dr Cherylann Ratel, Pain management .  Has follow up tomorrow due to concerns about excessive bleeding    2) T2DM:   She  feels generally well,  But is not  exercising regularly due to persistent back pain.  She would like to lose weight. Checking  blood sugars less than once daily at variable times, usually only if she feels she may be having a hypoglycemic event. .  BS have been under 130 fasting and < 150 post prandially.  Denies any recent hypoglyemic events.  Taking   no antihyperglycemic medications .  Following a carbohydrate modified diet 6 days per week. Denies numbness, burning and tingling of extremities. Appetite is good.    3) HLD/aortic atherosclerosis  :  Reviewed findings of prior CT scan today..  Patient is tolerating high potency statin therapy  on atorvastatin  4)  insomnia:  she suspended ambein 5 days ago; she has been able to sleep with the additional of melatonin and mg glycinate   Outpatient Medications Prior to Visit  Medication Sig Dispense Refill   albuterol (VENTOLIN HFA) 108 (90 Base)  MCG/ACT inhaler Inhale 1-2 puffs into the lungs every 6 (six) hours as needed for wheezing or shortness of breath.     atorvastatin (LIPITOR) 20 MG tablet Take 1 tablet (20 mg total) by mouth daily. 90 tablet 3   cephALEXin (KEFLEX) 500 MG capsule Take 1 capsule (500 mg total) by mouth 4 (four) times daily for 7 days. 28 capsule 0   cyanocobalamin (VITAMIN B12) 1000 MCG/ML injection Inject 1 mL (1,000 mcg total) into the muscle every 30 (thirty) days. 1 mL 11   docusate sodium (COLACE) 100 MG capsule Take 100 mg by mouth daily.     loratadine (CLARITIN) 10 MG tablet Take 10 mg by mouth daily as needed.     metFORMIN (GLUCOPHAGE) 1000 MG tablet TAKE 1 TABLET BY MOUTH TWICE A DAY WITH FOOD 180 tablet 2   methocarbamol (ROBAXIN) 500 MG tablet Take 500 mg by mouth 3 (three) times daily.     ondansetron (ZOFRAN) 4 MG tablet Take 1 tablet (4 mg total) by mouth every 6 (six) hours as needed for nausea. 20 tablet 0   oxybutynin (DITROPAN XL) 15 MG 24 hr tablet TAKE 1 TABLET BY MOUTH EVERYDAY AT BEDTIME 90 tablet 2   pantoprazole (PROTONIX) 40 MG tablet Take 1 tablet (40 mg total) by mouth daily. 90 tablet 3   QUEtiapine (SEROQUEL) 300 MG tablet TAKE 1 TABLET BY MOUTH EVERYDAY AT BEDTIME 90 tablet 2   rOPINIRole (REQUIP) 0.25 MG tablet TAKE 1 TABLET BY MOUTH THREE TIMES A DAY 270  tablet 2   Syringe/Needle, Disp, (SYRINGE 3CC/25GX1") 25G X 1" 3 ML MISC 1 Syringe by Does not apply route every 30 (thirty) days. 12 each 0   ALPRAZolam (XANAX) 0.25 MG tablet TAKE 1-2 TABLETS (0.25-0.5 MG TOTAL) BY MOUTH 2 (TWO) TIMES DAILY AS NEEDED FOR ANXIETY. 30 tablet 2   levothyroxine (SYNTHROID) 50 MCG tablet TAKE 1 TABLET BY MOUTH DAILY BEFORE BREAKFAST. 90 tablet 1   oxyCODONE (OXY IR/ROXICODONE) 5 MG immediate release tablet Take 0.5-1 tablets (2.5-5 mg total) by mouth every 6 (six) hours as needed for severe pain or breakthrough pain. 20 tablet 0   rosuvastatin (CRESTOR) 5 MG tablet Take 5 mg by mouth daily.      sertraline (ZOLOFT) 100 MG tablet TAKE 1 TABLET BY MOUTH EVERY DAY 30 tablet 0   zolpidem (AMBIEN) 5 MG tablet TAKE 1 TABLET BY MOUTH AT BEDTIME AS NEEDED FOR SLEEP 30 tablet 0   benzonatate (TESSALON) 100 MG capsule Take 1 capsule (100 mg total) by mouth every 8 (eight) hours. (Patient not taking: Reported on 02/02/2024) 21 capsule 0   promethazine-dextromethorphan (PROMETHAZINE-DM) 6.25-15 MG/5ML syrup Take 5 mLs by mouth at bedtime as needed. (Patient not taking: Reported on 02/02/2024) 118 mL 0   No facility-administered medications prior to visit.    Review of Systems;  Patient denies headache, fevers, malaise, unintentional weight loss, skin rash, eye pain, sinus congestion and sinus pain, sore throat, dysphagia,  hemoptysis , cough, dyspnea, wheezing, chest pain, palpitations, orthopnea, edema, abdominal pain, nausea, melena, diarrhea, constipation, flank pain, dysuria, hematuria, urinary  Frequency, nocturia, numbness, tingling, seizures,  Focal weakness, Loss of consciousness,  Tremor, insomnia, depression, anxiety, and suicidal ideation.      Objective:  BP 98/66   Pulse 65   Ht 5\' 3"  (1.6 m)   Wt 170 lb (77.1 kg)   SpO2 98%   BMI 30.11 kg/m   BP Readings from Last 3 Encounters:  02/02/24 98/66  02/01/24 107/69  01/13/24 113/76    Wt Readings from Last 3 Encounters:  02/02/24 170 lb (77.1 kg)  02/01/24 154 lb (69.9 kg)  12/01/23 158 lb (71.7 kg)    Physical Exam Vitals reviewed.  Constitutional:      General: She is not in acute distress.    Appearance: Normal appearance. She is normal weight. She is not ill-appearing, toxic-appearing or diaphoretic.  HENT:     Head: Normocephalic.  Eyes:     General: No scleral icterus.       Right eye: No discharge.        Left eye: No discharge.     Conjunctiva/sclera: Conjunctivae normal.  Cardiovascular:     Rate and Rhythm: Normal rate and regular rhythm.     Heart sounds: Normal heart sounds.  Pulmonary:     Effort:  Pulmonary effort is normal. No respiratory distress.     Breath sounds: Normal breath sounds.  Musculoskeletal:        General: Normal range of motion.       Arms:     Comments: Spinal cord stimulator external pack secured with adhesive, some bleeding under bandage.  No erythema   Skin:    General: Skin is warm and dry.  Neurological:     General: No focal deficit present.     Mental Status: She is alert and oriented to person, place, and time. Mental status is at baseline.  Psychiatric:        Mood and Affect: Mood normal.  Behavior: Behavior normal.        Thought Content: Thought content normal.        Judgment: Judgment normal.    Lab Results  Component Value Date   HGBA1C 6.4 07/08/2023   HGBA1C 6.4 12/29/2022   HGBA1C 6.7 (A) 04/02/2022    Lab Results  Component Value Date   CREATININE 0.95 07/08/2023   CREATININE 1.03 05/05/2023   CREATININE 0.79 04/24/2023    Lab Results  Component Value Date   WBC 9.6 05/11/2023   HGB 12.7 05/11/2023   HCT 37.9 05/11/2023   PLT 441 (H) 05/11/2023   GLUCOSE 88 07/08/2023   CHOL 255 (H) 07/08/2023   TRIG 171 (H) 07/08/2023   HDL 56 07/08/2023   LDLDIRECT 152.0 12/29/2022   LDLCALC 167 (H) 07/08/2023   ALT 12 07/08/2023   AST 14 07/08/2023   NA 138 07/08/2023   K 4.3 07/08/2023   CL 103 07/08/2023   CREATININE 0.95 07/08/2023   BUN 17 07/08/2023   CO2 27 07/08/2023   TSH 2.24 07/08/2023   INR 0.9 04/23/2023   HGBA1C 6.4 07/08/2023   MICROALBUR 0.4 09/18/2023    DG PAIN CLINIC C-ARM 1-60 MIN NO REPORT Result Date: 02/01/2024 Fluoro was used, but no Radiologist interpretation will be provided. Please refer to "NOTES" tab for provider progress note.   Assessment & Plan:  .Hypothyroidism, unspecified type -     TSH  Bipolar 1 disorder (HCC) -     Sertraline HCl; Take 1 tablet (100 mg total) by mouth daily.  Dispense: 90 tablet; Refill: 1 -     ALPRAZolam; Take 1 tablet (0.25 mg total) by mouth at bedtime as  needed for anxiety.  Dispense: 30 tablet; Refill: 5  Insomnia, unspecified type Assessment & Plan: She has discontinued ambien and is using melatonin, mg glycinate,  alprazolam 0.25 mg and seroquel  Orders: -     Sertraline HCl; Take 1 tablet (100 mg total) by mouth daily.  Dispense: 90 tablet; Refill: 1 -     ALPRAZolam; Take 1 tablet (0.25 mg total) by mouth at bedtime as needed for anxiety.  Dispense: 30 tablet; Refill: 5  Type 2 diabetes mellitus without complication, without long-term current use of insulin (HCC) Assessment & Plan: Historically  well-controlled without medications .  hemoglobin A1c is at goal of less than 7.0 . Patient is reminded to schedule an annual eye exam and foot exam is normal today. Patient has no microalbuminuria. Patient   is tolerating statin therapy for CAD risk reduction but was not at goal ,  repeat lipids are pending She has gained 15 lbs due to immobility from hip replacement and chronic back pain. Encouraged to consider GLP 1 agonist to reduce abdominal girth .   Lab Results  Component Value Date   HGBA1C 6.4 07/08/2023   Lab Results  Component Value Date   MICROALBUR 0.4 09/18/2023   MICROALBUR 1.2 12/31/2022     Lab Results  Component Value Date   CHOL 255 (H) 07/08/2023   HDL 56 07/08/2023   LDLCALC 167 (H) 07/08/2023   LDLDIRECT 152.0 12/29/2022   TRIG 171 (H) 07/08/2023   CHOLHDL 4.6 07/08/2023     Orders: -     Hemoglobin A1c -     Microalbumin / creatinine urine ratio -     Comprehensive metabolic panel with GFR  Hyperlipidemia, unspecified hyperlipidemia type -     Lipid panel -     LDL cholesterol, direct  Iron deficiency -     CBC with Differential/Platelet  Bleeding at insertion site -     Protime-INR -     APTT  Type 2 diabetes mellitus with obesity (HCC) Assessment & Plan: Historically  well-controlled without medications .  hemoglobin A1c is at goal of less than 7.0 . Patient is reminded to schedule an annual  eye exam and foot exam is normal today. Patient has no microalbuminuria. Patient   is tolerating statin therapy for CAD risk reduction but was not at goal ,  repeat lipids are pending She has gained 15 lbs due to immobility from hip replacement and chronic back pain. Encouraged to consider GLP 1 agonist to reduce abdominal girth .   Lab Results  Component Value Date   HGBA1C 6.4 07/08/2023   Lab Results  Component Value Date   MICROALBUR 0.4 09/18/2023   MICROALBUR 1.2 12/31/2022     Lab Results  Component Value Date   CHOL 255 (H) 07/08/2023   HDL 56 07/08/2023   LDLCALC 167 (H) 07/08/2023   LDLDIRECT 152.0 12/29/2022   TRIG 171 (H) 07/08/2023   CHOLHDL 4.6 07/08/2023      Bipolar disorder, currently in remission [facility]) Assessment & Plan: Symptoms and mood are stable on seroquel and zoloft.  No changes today    Chronic midline low back pain, unspecified whether sciatica present Assessment & Plan: She is undergoing a trial of a spinal cord stimulator    Morbid obesity (HCC) Assessment & Plan: Complicated by type  2 DM and hyperliidemia.  Patient has been unable to lose or maintain a healthy weight due to chronic low back pain ,  recent hip surgery.   Screened for contraindications to use of  GLP 1 agonists for appetite suppression and she has none.  The risks and benefits of pharmacotherapy were discussed and she is contemplative      Other orders -     Levothyroxine Sodium; Take 1 tablet (50 mcg total) by mouth daily before breakfast.  Dispense: 90 tablet; Refill: 0     I spent 34 minutes on the day of this face to face encounter reviewing patient's  most recent visit with pain management , orthopedics ,   prior relevant surgical and non surgical procedures, recent  labs and imaging studies, counseling on weight management,  reviewing the assessment and plan with patient, and post visit ordering and reviewing of  diagnostics and therapeutics with patient  .    Follow-up: Return in about 6 months (around 08/03/2024) for follow up diabetes, physical.   Sherlene Shams, MD

## 2024-02-02 NOTE — Assessment & Plan Note (Signed)
 She is undergoing a trial of a spinal cord stimulator

## 2024-02-02 NOTE — Assessment & Plan Note (Signed)
Symptoms and mood are stable on seroquel and zoloft.  No changes today

## 2024-02-02 NOTE — Assessment & Plan Note (Signed)
 She has discontinued ambien and is using melatonin, mg glycinate,  alprazolam 0.25 mg and seroquel

## 2024-02-02 NOTE — Assessment & Plan Note (Addendum)
 Historically  well-controlled without medications .  hemoglobin A1c is at goal of less than 7.0 . Patient is reminded to schedule an annual eye exam and foot exam is normal today. Patient has no microalbuminuria. Patient   is tolerating statin therapy for CAD risk reduction but was not at goal ,  repeat lipids are pending She has gained 15 lbs due to immobility from hip replacement and chronic back pain. Encouraged to consider GLP 1 agonist to reduce abdominal girth .   Lab Results  Component Value Date   HGBA1C 6.4 07/08/2023   Lab Results  Component Value Date   MICROALBUR 0.4 09/18/2023   MICROALBUR 1.2 12/31/2022     Lab Results  Component Value Date   CHOL 255 (H) 07/08/2023   HDL 56 07/08/2023   LDLCALC 167 (H) 07/08/2023   LDLDIRECT 152.0 12/29/2022   TRIG 171 (H) 07/08/2023   CHOLHDL 4.6 07/08/2023

## 2024-02-03 ENCOUNTER — Encounter: Payer: Self-pay | Admitting: Student in an Organized Health Care Education/Training Program

## 2024-02-03 ENCOUNTER — Ambulatory Visit
Attending: Student in an Organized Health Care Education/Training Program | Admitting: Student in an Organized Health Care Education/Training Program

## 2024-02-03 ENCOUNTER — Encounter: Payer: Self-pay | Admitting: Internal Medicine

## 2024-02-03 VITALS — BP 116/80 | HR 65 | Temp 97.2°F | Ht 63.0 in | Wt 169.0 lb

## 2024-02-03 DIAGNOSIS — M5416 Radiculopathy, lumbar region: Secondary | ICD-10-CM

## 2024-02-03 DIAGNOSIS — M961 Postlaminectomy syndrome, not elsewhere classified: Secondary | ICD-10-CM

## 2024-02-03 DIAGNOSIS — G8929 Other chronic pain: Secondary | ICD-10-CM

## 2024-02-03 LAB — COMPREHENSIVE METABOLIC PANEL WITH GFR
ALT: 11 U/L (ref 0–35)
AST: 15 U/L (ref 0–37)
Albumin: 4.5 g/dL (ref 3.5–5.2)
Alkaline Phosphatase: 55 U/L (ref 39–117)
BUN: 10 mg/dL (ref 6–23)
CO2: 29 meq/L (ref 19–32)
Calcium: 9.2 mg/dL (ref 8.4–10.5)
Chloride: 106 meq/L (ref 96–112)
Creatinine, Ser: 1 mg/dL (ref 0.40–1.20)
GFR: 64.84 mL/min (ref >60.00)
Glucose, Bld: 114 mg/dL — ABNORMAL HIGH (ref 70–99)
Potassium: 4.1 meq/L (ref 3.5–5.1)
Sodium: 142 meq/L (ref 135–145)
Total Bilirubin: 0.4 mg/dL (ref 0.2–1.2)
Total Protein: 6.9 g/dL (ref 6.0–8.3)

## 2024-02-03 LAB — CBC WITH DIFFERENTIAL/PLATELET
Basophils Absolute: 0.1 10*3/uL (ref 0.0–0.1)
Basophils Relative: 1.2 % (ref 0.0–3.0)
Eosinophils Absolute: 0.2 10*3/uL (ref 0.0–0.7)
Eosinophils Relative: 1.6 % (ref 0.0–5.0)
HCT: 36.1 % (ref 36.0–46.0)
Hemoglobin: 12.3 g/dL (ref 12.0–15.0)
Lymphocytes Relative: 35.2 % (ref 12.0–46.0)
Lymphs Abs: 3.4 10*3/uL (ref 0.7–4.0)
MCHC: 34 g/dL (ref 30.0–36.0)
MCV: 93.9 fl (ref 78.0–100.0)
Monocytes Absolute: 0.7 10*3/uL (ref 0.1–1.0)
Monocytes Relative: 6.9 % (ref 3.0–12.0)
Neutro Abs: 5.3 10*3/uL (ref 1.4–7.7)
Neutrophils Relative %: 55.1 % (ref 43.0–77.0)
Platelets: 387 10*3/uL (ref 150.0–400.0)
RBC: 3.84 Mil/uL — ABNORMAL LOW (ref 3.87–5.11)
RDW: 13.1 % (ref 11.5–15.5)
WBC: 9.7 10*3/uL (ref 4.0–10.5)

## 2024-02-03 LAB — LIPID PANEL
Cholesterol: 130 mg/dL (ref 0–200)
HDL: 43.5 mg/dL (ref >39.00)
LDL Cholesterol: 48 mg/dL (ref 0–99)
NonHDL: 86.75
Total CHOL/HDL Ratio: 3
Triglycerides: 195 mg/dL — ABNORMAL HIGH (ref 0.0–149.0)
VLDL: 39 mg/dL (ref 0.0–40.0)

## 2024-02-03 LAB — LDL CHOLESTEROL, DIRECT: Direct LDL: 62 mg/dL

## 2024-02-03 LAB — HEMOGLOBIN A1C: Hgb A1c MFr Bld: 6.2 % (ref 4.6–6.5)

## 2024-02-03 LAB — MICROALBUMIN / CREATININE URINE RATIO
Creatinine,U: 226.5 mg/dL
Microalb Creat Ratio: 7.4 mg/g (ref 0.0–30.0)
Microalb, Ur: 1.7 mg/dL (ref 0.0–1.9)

## 2024-02-03 LAB — PROTIME-INR
INR: 0.9
Prothrombin Time: 10 s (ref 9.0–11.5)

## 2024-02-03 LAB — APTT: aPTT: 26 s (ref 23–32)

## 2024-02-03 LAB — TSH: TSH: 1.39 u[IU]/mL (ref 0.35–5.50)

## 2024-02-03 NOTE — Progress Notes (Signed)
 Safety precautions to be maintained throughout the outpatient stay will include: orient to surroundings, keep bed in low position, maintain call bell within reach at all times, provide assistance with transfer out of bed and ambulation.   SCS trial  site leaking small amount blood per patient since yesterday morning; denies any bumping in to anything; Dr Cherylann Ratel notified

## 2024-02-03 NOTE — Patient Instructions (Signed)

## 2024-02-03 NOTE — Progress Notes (Signed)
 PROVIDER NOTE: Interpretation of information contained herein should be left to medically-trained personnel. Specific patient instructions are provided elsewhere under "Patient Instructions" section of medical record. This document was created in part using AI and STT-dictation technology, any transcriptional errors that may result from this process are unintentional.  Patient: Kathleen Carr  Service: E/M   PCP: Sherlene Shams, MD  DOB: 11-07-1970  DOS: 02/03/2024  Provider: Edward Jolly, MD  MRN: [id number]  Delivery: Face-to-face  Specialty: Interventional Pain Management  Type: Established Patient  Setting: Ambulatory outpatient facility  Specialty designation: 09  Referring Prov.: Sherlene Shams, MD  Location: Outpatient office facility       HPI  Kathleen Carr, a 53 y.o. year old female, is here today because of her Failed back surgical syndrome [M96.1]. Kathleen Carr primary complain today is Back Pain (lower)   Pain Assessment: Severity of Chronic pain is reported as a 5 /10. Location: Back Lower/just at surgical site. Onset: More than a month ago. Quality: Sharp, Throbbing. Timing: Constant. Modifying factor(s): nothing. Vitals:  height is 5\' 3"  (1.6 m) and weight is 169 lb (76.7 kg). Her temperature is 97.2 F (36.2 C) (abnormal). Her blood pressure is 116/80 and her pulse is 65. Her oxygen saturation is 100%.  BMI: Estimated body mass index is 29.94 kg/m as calculated from the following:   Height as of this encounter: 5\' 3"  (1.6 m).   Weight as of this encounter: 169 lb (76.7 kg). Last encounter: 12/01/2023. Last procedure: 02/01/2024.  Reason for encounter: re-dressing of SCS trial lead insertion site -patient noticed blood under her tegaderm yesterday (had SCS trial leads placed Monday) -Redressed with new steri-strips and tegaderm -Instructed patient to monitor site and inform us if any additional bleeding -She is endorsing pain relief in her right leg and improved ROM since  Monday  ROS  Constitutional: Denies any fever or chills Gastrointestinal: No reported hemesis, hematochezia, vomiting, or acute GI distress Musculoskeletal: Denies any acute onset joint swelling, redness, loss of ROM, or weakness Neurological: No reported episodes of acute onset apraxia, aphasia, dysarthria, agnosia, amnesia, paralysis, loss of coordination, or loss of consciousness  Medication Review  ALPRAZolam, QUEtiapine, SYRINGE 3CC/25GX1", albuterol, atorvastatin, cephALEXin, cyanocobalamin, docusate sodium, levothyroxine, loratadine, metFORMIN, methocarbamol, ondansetron, oxybutynin, pantoprazole, rOPINIRole, sertraline, simvastatin, and sitaGLIPtin  History Review  Allergy: Kathleen Carr is allergic to dust mite mixed allergen ext [mite (d. farinae)], nsaids, other, sulfa antibiotics, latex, and misc. sulfonamide containing compounds. Drug: Kathleen Carr  reports that she does not currently use drugs. Alcohol:  reports that she does not currently use alcohol. Tobacco:  reports that she quit smoking about a year ago. Her smoking use included cigarettes. She started smoking about 43 years ago. She has a 21 pack-year smoking history. She has never used smokeless tobacco. Social: [name]  reports that she quit smoking about a year ago. Her smoking use included cigarettes. She started smoking about 43 years ago. She has a 21 pack-year smoking history. She has never used smokeless tobacco. She reports that she does not currently use alcohol. She reports that she does not currently use drugs. 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 Surgical: Kathleen Carr  has a past surgical history that  includes Abdominal hysterectomy (12/03/2011); Adenoidectomy, tonsillectomy and myringotomy with tube placement (1977); Cholecystectomy (06/08/2011); Knee arthroscopy (Right, 1993); Colonoscopy with propofol (N/A, 02/05/2022); Esophagogastroduodenoscopy (N/A, 02/05/2022); Carpal tunnel release (Right, 03/12/2022); Carpal tunnel release (Left, 04/16/2022); Hip arthroscopy (Right, 08/19/2022); Lumbar laminectomy (Left, 2021); Knee arthroscopy (Left, 1998); and Total hip arthroplasty (Right, 03/09/2023). Family: family history includes Breast cancer in her paternal aunt; Diabetes in her maternal grandfather; Glaucoma in her maternal grandmother; Hypertension in her mother and paternal grandfather; Lung cancer (age of onset: 35) in her father; Renal Disease in her maternal grandfather; Stroke in her paternal grandfather; Thyroid disease in her mother; Ulcerative colitis in her father.  Laboratory Chemistry Profile   Renal Lab Results  Component Value Date   BUN 17 07/08/2023   CREATININE 0.95 07/08/2023   LABCREA 79 09/18/2023   BCR NOT APPLICABLE 07/19/2021   GFR 69.24 07/08/2023   GFRAA 90 08/02/2020   GFRNONAA >60 04/24/2023    Hepatic Lab Results  Component Value Date   AST 14 07/08/2023   ALT 12 07/08/2023   ALBUMIN 4.2 07/08/2023   ALKPHOS 56 07/08/2023   AMYLASE 33 01/03/2022   LIPASE 46.0 01/03/2022   AMMONIA 21 04/22/2023    Electrolytes Lab Results  Component Value Date   NA 138 07/08/2023   K 4.3 07/08/2023   CL 103 07/08/2023   CALCIUM 9.7 07/08/2023   PHOS 2.7 12/31/2022    Bone Lab Results  Component Value Date   VD25OH 32.72 07/08/2023    Inflammation (CRP: Acute Phase) (ESR: Chronic Phase) Lab Results  Component Value Date   CRP <1.0 12/31/2022   ESRSEDRATE 26 01/03/2022   LATICACIDVEN 1.1 04/23/2023         Note: Above Lab results reviewed.  Recent Imaging Review  DG PAIN CLINIC C-ARM  1-60 MIN NO REPORT Fluoro was used, but no Radiologist interpretation will be provided.  Please refer to "NOTES" tab for provider progress note. Note: Reviewed        Physical Exam  General appearance: Well nourished, well developed, and well hydrated. In no apparent acute distress Mental status: Alert, oriented x 3 (person, place, & time)       Respiratory: No evidence of acute respiratory distress Eyes: PERLA Vitals: BP 116/80   Pulse 65   Temp (!) 97.2 F (36.2 C)   Ht 5\' 3"  (1.6 m)   Wt 169 lb (76.7 kg)   SpO2 100%   BMI 29.94 kg/m  BMI: Estimated body mass index is 29.94 kg/m as calculated from the following:   Height as of this encounter: 5\' 3"  (1.6 m).   Weight as of this encounter: 169 lb (76.7 kg). Ideal: Ideal body weight: 52.4 kg (115 lb 8.3 oz) Adjusted ideal body weight: 62.1 kg (136 lb 14.6 oz)  SCS trial leads in place Redressed with steri-strips and tegaderm  Assessment   Diagnosis Status  1. Failed back surgical syndrome   2. Lumbar post-laminectomy syndrome   3. Chronic radicular lumbar pain    Controlled Controlled Controlled   Updated Problems: No problems updated.  Follow up Monday for SCS lead pull  Follow-up plan:   Return for Keep sch. appt.    Recent Visits Date Type Provider Dept  02/01/24 Procedure visit Edward Jolly, MD [facility]  12/01/23 Office Visit Edward Jolly, MD [facility]  Showing recent visits within past 90 days and meeting all other requirements Today's Visits Date Type Provider Dept  02/03/24 Office Visit Edward Jolly, MD  [facility]  Showing today's visits and meeting all other requirements Future Appointments Date Type Provider Dept  02/08/24 Appointment Edward Jolly, MD [facility]  Showing future appointments within next 90 days and meeting all other requirements  I discussed the assessment and treatment plan with the patient. The patient was provided an opportunity  to ask questions and all were answered. The patient agreed with the plan and demonstrated an understanding of the instructions.  Patient advised to call back or seek an in-person evaluation if the symptoms or condition worsens.  Duration of encounter: .  Total time on encounter, as per AMA guidelines included both the face-to-face and non-face-to-face time personally spent by the physician and/or other qualified health care professional(s) on the day of the encounter (includes time in activities that require the physician or other qualified health care professional and does not include time in activities normally performed by clinical staff). Physician's time may include the following activities when performed: Preparing to see the patient (e.g., pre-charting review of records, searching for previously ordered imaging, lab work, and nerve conduction tests) Review of prior analgesic pharmacotherapies. Reviewing PMP Interpreting ordered tests (e.g., lab work, imaging, nerve conduction tests) Performing post-procedure evaluations, including interpretation of diagnostic procedures Obtaining and/or reviewing separately obtained history Performing a medically appropriate examination and/or evaluation Counseling and educating the patient/family/caregiver Ordering medications, tests, or procedures Referring and communicating with other health care professionals (when not separately reported) Documenting clinical information in the electronic or other health record Independently interpreting results (not separately reported) and communicating results to the patient/ family/caregiver Care coordination (not separately reported)  Note by: Edward Jolly, MD (TTS and AI technology used. I apologize for any typographical errors that were not detected and corrected.) Date: 02/03/2024; Time: 9:59 AM

## 2024-02-03 NOTE — Progress Notes (Signed)
 Reinforced and cleaned area by Dr Cherylann Ratel  New  dressing and pouch applied.

## 2024-02-04 ENCOUNTER — Other Ambulatory Visit: Payer: Self-pay

## 2024-02-04 ENCOUNTER — Ambulatory Visit (INDEPENDENT_AMBULATORY_CARE_PROVIDER_SITE_OTHER): Admitting: Neurosurgery

## 2024-02-04 VITALS — BP 118/78 | Ht 63.0 in | Wt 169.0 lb

## 2024-02-04 DIAGNOSIS — Z01818 Encounter for other preprocedural examination: Secondary | ICD-10-CM

## 2024-02-04 DIAGNOSIS — G894 Chronic pain syndrome: Secondary | ICD-10-CM

## 2024-02-04 DIAGNOSIS — M961 Postlaminectomy syndrome, not elsewhere classified: Secondary | ICD-10-CM | POA: Diagnosis not present

## 2024-02-04 NOTE — Progress Notes (Signed)
 Referring Physician:  Sherlene Shams, MD [address] [place],  [place] [address]  Primary Physician:  Sherlene Shams, MD  History of Present Illness: 02/04/2024 Kathleen Carr returns to see me today.  She has had a near 100% relief in her pain with her spinal cord stimulator trial.  She will have a removed on Monday.  11/17/2023 Kathleen Carr was scheduled today in error. Plan per prior note 10/06/2023.  10/06/2023 Kathleen Carr returns to see me.  She continues to have pain in her lower back and leg as noted below.  She has numbness that hits her right arm and right leg.  She has had some weakness in her right lower extremity.  11/27/2022 Kathleen Carr is here today with a chief complaint of low back and right buttock and groin pain.  She also has pain on her anterior thigh and in her right lateral calf.  She has been having this pain for several years.  She underwent a left-sided L4-5 microdiscectomy in 2021 for left leg pain.  That pain has improved, but her back pain is worsened over time.  Prolonged standing, walking, and sitting make it worse.  Changing positions helps temporarily.  Medications have helped a small amount.  She had a very bad reaction to an injection during her previous bout with sciatica, so is not interested in considering further injections.  Bowel/Bladder Dysfunction: none  Conservative measures:  Physical therapy:  has not participated for her back; has participated in for her right hip from 08/25/22 to 10/30/22, but it was making her back pain worse. Multimodal medical therapy including regular antiinflammatories:  tylenol, ibuprofen, mobic, tramadol, and flexeril, methocarbamol  Injections:  has received epidural steroid injections 01/09/21: Right L4-5 and L5-S1 facet joint injection   Past Surgery: Left L4/5 discectomy in 2021 with Dr. Reed Breech D. Maund has no symptoms of cervical myelopathy.  The symptoms are causing a significant impact on the  patient's life.   I have utilized the care everywhere function in epic to review the outside records available from external health systems.  Telephone visit with [name], PA-C on 11/13/2022: I spoke with Kathleen Carr today regarding pain back and leg pain.  She has been in physical therapy for her hip but is having increased pain and is not able to participate with this.  She would like to move forward with surgical consideration.  I did encourage her to contact the physical therapist and see if they are willing to formally discharge her from PT given her increased pain and inability to participate.  She will do that today.  I have also reviewed her imaging and symptoms with [name] who felt that a discussion of possible surgical intervention was reasonable.  Will get her scheduled to see [name] to discuss her options.  She was encouraged to call the office should she have any questions or concerns.  Office visit with [name], PA-C on 10/23/2022: Kathleen Carr is a 53 year old presenting today for further evaluation of persistent back pain.  She has since undergone her right hip surgery which has provided significant relief to her right groin pain however despite this she has persistent low back pain that is debilitating and unchanged in nature.  In addition to this she describes some discomfort in her left lateral leg.  She denies any other radiating leg symptoms.  She would like further evaluation and treatment of this.   Office visit with Kennyth Arnold  Doy Mince, PA-C on 07/22/2022: History of Left L5-S1 lumbar laminectomy in 2021 with [name].    History of bipolar, DM, GERD, HTN, and PTSD.    Last seen by [name] on 03/20/22 for LBP with right groin and anterior thigh pain.    MRI 02/21/22 showed multilevel degenerative changes the lumbar spine, worst at L4-L5 where there is asymmetric left disc bulging and facet arthropathy resulting in severe left-sided neural foraminal stenosis and  exiting nerve root impingement. Mild spinal canal and moderate right neural foraminal stenosis at this level.    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 Discussed lumbar ESI and PT, she wanted to hold off on these.    She is here for follow up.    She saw ortho and is scheduled for right hip surgery in October (labral tear and bone spurs per patient).    She has constant right > left sided LBP with some pulling in front right thigh and left posterior thigh. Leg pain is intermittent. Pain is worse with prolonged standing and walking. She has pain with carrying laundry basket. Some relief relief with changing position. No numbness or tingling. No weakness noted. No bowel or bladder issues.    She is on flexeril and ultram. She has minimal improvement with these. No recent lumbar ESIs (had prior to surgery in 2021). No recent PT for her lower back.   Office visit with [name], PA-C on 02/04/2022: Kathleen Carr is a 53 y.o with a history of diabetes (last A1c 7.2), HLD, and previous lumbar laminectomy who is here today with a chief complaint of low back pain and right radiating leg pain. She states this is been going on for about 2 years that any particular inciting event. She states that it feels like a crunching in her back when sitting upright and pulling radiating pain into her right groin and anterior thigh without extension below the knee. Her symptoms are worse with sitting for prolonged periods of time or walking for greater than 10 to 15 minutes and improves with laying flat on her back with her knees up. She states that this feels similar to the pain that she was having prior to her lumbar laminectomy in 2021. She did initially go back to [name] after onset of the symptoms but was told there is nothing they can do. She states that she has not had any recent MRI. She did undergo a couple weeks of physical therapy but was unable  to complete 6 weeks due to pain. She denies any similar left-sided symptoms. Of note she admits to about 1/2 pack per day of smoking     Review of Systems:  A 10 point review of systems is negative, except for the pertinent positives and negatives detailed in the HPI.  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   Pneumonia    PTSD (post-traumatic stress disorder)    Smoker    Thyroid disease     Past Surgical History: Past Surgical  History:  Procedure Laterality Date   ABDOMINAL HYSTERECTOMY  12/03/2011   complete   ADENOIDECTOMY, TONSILLECTOMY AND MYRINGOTOMY WITH TUBE PLACEMENT  1977   CARPAL TUNNEL RELEASE Right 03/12/2022   Procedure: RIGHT CARPAL TUNNEL RELEASE;  Surgeon: Marlyne Beards, MD;  Location: [facility];  Service: Orthopedics;  Laterality: Right;   CARPAL TUNNEL RELEASE Left 04/16/2022   Procedure: LEFT CARPAL TUNNEL RELEASE;  Surgeon: Marlyne Beards, MD;  Location: [facility];  Service: Orthopedics;  Laterality: Left;   CHOLECYSTECTOMY  06/08/2011   COLONOSCOPY WITH PROPOFOL N/A 02/05/2022   Procedure: COLONOSCOPY WITH PROPOFOL;  Surgeon: Wyline Mood, MD;  Location: [facility];  Service: Gastroenterology;  Laterality: N/A;   ESOPHAGOGASTRODUODENOSCOPY N/A 02/05/2022   Procedure: ESOPHAGOGASTRODUODENOSCOPY (EGD);  Surgeon: Wyline Mood, MD;  Location: [facility];  Service: Gastroenterology;  Laterality: N/A;   HIP ARTHROSCOPY Right 08/19/2022   Procedure: Right hip arthroscopy, acetabuloplasty, labral repair, femoral osteochondroplasty, capsular closure;  Surgeon:  Signa Kell, MD;  Location: [facility];  Service: Orthopedics;  Laterality: Right;   KNEE ARTHROSCOPY Right 1993   KNEE ARTHROSCOPY Left 1998   LUMBAR LAMINECTOMY Left 2021   TOTAL HIP ARTHROPLASTY Right 03/09/2023   Procedure: TOTAL HIP ARTHROPLASTY ANTERIOR APPROACH;  Surgeon: Reinaldo Berber, MD;  Location: [facility];  Service: Orthopedics;  Laterality: Right;    Allergies: Allergies as of 02/04/2024 - Review Complete 02/04/2024  Allergen Reaction Noted   Dust mite mixed allergen ext [mite (d. farinae)]  08/01/2020   Nsaids  12/31/2022   Other Hives 08/01/2020   Sulfa antibiotics Hives 06/24/2020   Latex Itching 08/12/2022   Misc. sulfonamide containing compounds Hives 05/05/2022    Medications: Current Meds  Medication Sig   albuterol (VENTOLIN HFA) 108 (90 Base) MCG/ACT inhaler Inhale 1-2 puffs into the lungs every 6 (six) hours as needed for wheezing or shortness of breath.   ALPRAZolam (XANAX) 0.25 MG tablet Take 1 tablet (0.25 mg total) by mouth at bedtime as needed for anxiety.   atorvastatin (LIPITOR) 20 MG tablet Take 1 tablet (20 mg total) by mouth daily.   cephALEXin (KEFLEX) 500 MG capsule Take 1 capsule (500 mg total) by mouth 4 (four) times daily for 7 days.   cyanocobalamin (VITAMIN B12) 1000 MCG/ML injection Inject 1 mL (1,000 mcg total) into the muscle every 30 (thirty) days.   docusate sodium (COLACE) 100 MG capsule Take 100 mg by mouth daily.   levothyroxine (SYNTHROID) 50 MCG tablet Take 1 tablet (50 mcg total) by mouth daily before breakfast.   loratadine (CLARITIN) 10 MG tablet Take 10 mg by mouth daily as needed.   metFORMIN (GLUCOPHAGE) 1000 MG tablet TAKE 1 TABLET BY MOUTH TWICE A DAY WITH FOOD   methocarbamol (ROBAXIN) 500 MG tablet Take 500 mg by mouth 3 (three) times daily.   ondansetron (ZOFRAN) 4 MG tablet Take 1 tablet (4 mg total) by mouth every 6 (six) hours as needed for nausea.   oxybutynin (DITROPAN XL) 15 MG 24 hr tablet TAKE 1 TABLET BY MOUTH  EVERYDAY AT BEDTIME   pantoprazole (PROTONIX) 40 MG tablet Take 1 tablet (40 mg total) by mouth daily.   QUEtiapine (SEROQUEL) 300 MG tablet TAKE 1 TABLET BY MOUTH EVERYDAY AT BEDTIME   rOPINIRole (REQUIP) 0.25 MG tablet TAKE 1 TABLET BY MOUTH THREE TIMES A DAY   sertraline (ZOLOFT) 100 MG tablet Take 1 tablet (100 mg total) by mouth daily.   Syringe/Needle, Disp, (SYRINGE 3CC/25GX1") 25G X 1" 3 ML MISC 1  Syringe by Does not apply route every 30 (thirty) days.    Social History: Social History   Tobacco Use   Smoking status: Former    Current packs/day: 0.00    Average packs/day: 0.5 packs/day for 42.0 years (21.0 ttl pk-yrs)    Types: Cigarettes    Start date: 01/25/1981    Quit date: 01/26/2023    Years since quitting: 1.0   Smokeless tobacco: Never  Vaping Use   Vaping status: Former   Substances: Nicotine  Substance Use Topics   Alcohol use: Not Currently   Drug use: Not Currently    Family Medical History: Family History  Problem Relation Age of Onset   Hypertension Mother    Thyroid disease Mother    Lung cancer Father 52       carcinoid, right lung   Ulcerative colitis Father    Breast cancer Paternal Aunt        x3 aunts   Glaucoma Maternal Grandmother    Renal Disease Maternal Grandfather    Diabetes Maternal Grandfather        Type2   Stroke Paternal Grandfather    Hypertension Paternal Grandfather     Physical Examination:   Medical Decision Making  Imaging: MRI L spine 02/21/2022 Disc levels:   T11-T12: No significant spinal canal or neural foraminal narrowing.   T12-L1: No significant spinal canal or neural foraminal narrowing.   L1-L2: Minimal disc bulging.  No significant stenosis.   L2-L3: Mild disc bulging, ligamentum flavum hypertrophy and bowel facet arthropathy. There is mild spinal canal stenosis and minimal bilateral neural foraminal narrowing.   L3-L4: Mild bilateral facet arthropathy. No significant spinal canal or neural foraminal  stenosis.   L4-L5: Asymmetric left disc bulging, ligament flavum hypertrophy mild facet arthropathy. There is mild spinal canal stenosis, severe left and moderate right neural foraminal stenosis.   L5-S1: No significant spinal canal or neural foraminal narrowing.   IMPRESSION: Multilevel degenerative changes the lumbar spine, worst at L4-L5 where there is asymmetric left disc bulging and facet arthropathy resulting in severe left-sided neural foraminal stenosis and exiting nerve root impingement. Mild spinal canal and moderate right neural foraminal stenosis at this level.   Milder degenerative changes from L1 through L4 as described above.     Electronically Signed   By: Caprice Renshaw M.D.   On: 02/21/2022 09:06  MRI CTL spine 09/01/2023 IMPRESSION: 1. New focus of increased T2 signal within the right hemicord at T4-T5, just below where a prominent central disc protrusion flattens the right ventral cord. 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 2. Otherwise unchanged multilevel thoracic spondylosis as described above. Unchanged mild spinal canal stenosis at T7-T8. 3. Unchanged trace bilateral pleural effusions.     Electronically Signed   By: Obie Dredge M.D.   On: 09/23/2023 08:50 FINDINGS: Alignment: Straightening of the normal cervical lordosis. No significant listhesis.   Vertebrae: No fracture, evidence of discitis, or bone lesion.   Cord: Normal signal and morphology.   Posterior Fossa, vertebral arteries, paraspinal tissues: Negative.   Disc levels:   C2-C3:  No significant disc bulge or herniation.  No stenosis.   C3-C4:  No significant disc bulge or herniation.  No stenosis.   C4-C5: No significant disc bulge or herniation. Moderate right and mild left facet uncovertebral hypertrophy. Moderate right neuroforaminal stenosis. No spinal canal or left neuroforaminal stenosis.   C5-C6: No significant disc bulge or herniation. Mild bilateral  facet uncovertebral hypertrophy. No stenosis.   C6-C7:  Mild disc bulging and bilateral uncovertebral hypertrophy. No stenosis.   C7-T1: Negative disc. Moderate right facet arthropathy. No stenosis.   IMPRESSION: 1. Multilevel cervical spondylosis as described above. Moderate right neuroforaminal stenosis at C4-C5.     Electronically Signed   By: Obie Dredge M.D.    On: 09/23/2023 08:55 L4-L5: Postsurgical change. Unchanged mild-to-moderate disc bulging with superimposed small left foraminal disc protrusion. Unchanged moderate bilateral facet arthropathy. Unchanged mild bilateral lateral recess stenosis. Unchanged mild right and moderate to severe left neuroforaminal stenosis. No spinal canal stenosis.   L5-S1: Negative disc. Unchanged moderate bilateral facet arthropathy. No stenosis.   IMPRESSION: 1. Unchanged multilevel lumbar spondylosis as described above. Unchanged moderate to severe left neuroforaminal stenosis at L4-L5.     Electronically Signed   By: Obie Dredge M.D.   On: 09/23/2023 09:03   I have personally reviewed the images and agree with the above interpretation.  Assessment and Plan: Ms. Lucia is a pleasant 53 y.o. female with back pain with right leg pain concerning for sciatica.  She has chronic pain syndrome due to postlaminectomy syndrome.  She has had an excellent response to spinal cord stimulator evaluation.  I think she is an excellent candidate for permanent placement.  She has passed a psychology evaluation.  Will proceed with thoracic laminectomy for spinal cord stimulator placement.  I discussed the planned procedure at length with the patient, including the risks, benefits, alternatives, and indications. The risks discussed include but are not limited to bleeding, infection, need for reoperation, spinal fluid leak, stroke, vision loss, anesthetic complication, coma, paralysis, and even death. I also described in detail that improvement  was not guaranteed.  The patient expressed understanding of these risks, and asked that we proceed with surgery. I described the surgery in layman's terms, and gave ample opportunity for questions, which were answered to the best of my ability.       Kathleen Carr K. Myer Haff MD, [facility] Neurosurgery

## 2024-02-04 NOTE — Patient Instructions (Signed)
 Please see below for information in regards to your upcoming surgery:   Planned surgery: thoracic laminectomy for spinal cord stimulator placement ([employer])   Surgery date: 02/17/24 at [facility] (Medical Mall: [address], [place], [place] [address]) - you will find out your arrival time the business day before your surgery.   Pre-op appointment at [facility] Pre-admit Testing: you will receive a call with a date/time for this appointment. If you are scheduled for an in person appointment, Pre-admit Testing is located on the first floor of the [facility] building, [address], [address]. During this appointment, they will advise you which medications you can take the morning of surgery, and which medications you will need to hold for surgery. Labs (such as blood work, EKG) may be done at your pre-op appointment. You are not required to fast for these labs. Should you need to change your pre-op appointment, please call Pre-admit testing at [phone].     Diabetes/weight loss medications: Per anesthesia guidelines (due to the increased risk of aspiration caused by delayed gastric emptying):  Metformin: hold for 2 days prior to surgery    Common restrictions after surgery: No bending, lifting, or twisting ("BLT"). Avoid lifting objects heavier than 10 pounds for the first 6 weeks after surgery. Where possible, avoid household activities that involve lifting, bending, reaching, pushing, or pulling such as laundry, vacuuming, grocery shopping, and childcare. Try to arrange for help from friends and family for these activities while you heal. Do not drive while taking prescription pain medication. Weeks 6 through 12 after surgery: avoid lifting more than 25 pounds.     How to contact us:  If you have any questions/concerns before or after surgery, you can reach [place] at [phone], or you can send a mychart message. We can be reached by phone or  mychart 8am-4pm, Monday-Friday.  *Please note: Calls after 4pm are forwarded to a third party answering service. Mychart messages are not routinely monitored during evenings, weekends, and holidays. Please call our office to contact the answering service for urgent concerns during non-business hours.     If you have FMLA/disability paperwork, please drop it off or fax it to [phone], attention Patty.   Appointments/FMLA & disability paperwork: Joycelyn Rua, & Flonnie Hailstone Registered Nurses/Surgery schedulers: Ilsa Iha Medical Assistants: Nash Mantis Physician Assistants: Joan Flores, PA-C, [name], PA-C & Drake Leach, PA-C Surgeons: Venetia Night, MD & Ernestine Mcmurray, MD   [facility] PREADMIT TESTING VISIT and SURGERY INFORMATION SHEET   Now that surgery has been scheduled you can anticipate several phone calls from [facility] services. A pharmacy technician will call you to verify your current list of medications taken at home.               The Pre-Service Center will call to verify your insurance information and to give you billing estimates and information.             The Preadmit Testing Office will be calling to schedule a visit to obtain information for the anesthesia team and provide instructions on preparation for surgery.  What can you expect for the Preadmit Testing Visit: Appointments may be scheduled in-person or by telephone.  If a telephone visit is scheduled, you may be asked to come into the office to have lab tests or other studies performed.   This visit will not be completed any greater than 14 days prior to your surgery.  If  your surgery has been scheduled for a future date, please do not be alarmed if we have not contacted you to schedule an appointment more than a month prior to the surgery date.    Please be prepared to provide the following information during this appointment:            -Personal medical history                                                -Medication and allergy list            -Any history of problems with anesthesia              -Recent lab work or diagnostic studies            -Please notify us of any needs we should be aware of to provide the best care possible           -You will be provided with instructions on how to prepare for your surgery.    On The Day of Surgery:  You must have a driver to take you home after surgery, you will be asked not to drive for 24 hours following surgery.  Taxi, Benedetto Goad and non-medical transport will not be acceptable means of transportation unless you have a responsible individual who will be traveling with you.  Visitors in the surgical area:   2 people will be able to visit you in your room once your preparation for surgery has been completed. During surgery, your visitors will be asked to wait in the Surgery Waiting Area.  It is not a requirement for them to stay, if they prefer to leave and come back.  Your visitor(s) will be given an update once the surgery has been completed.  No visitors are allowed in the initial recovery room to respect patient privacy and safety.  Once you are more awake and transfer to the secondary recovery area, or are transferred to an inpatient room, visitors will again be able to see you.  To respect and protect your privacy: We will ask on the day of surgery who your driver will be and what the contact number for that individual will be. We will ask if it is okay to share information with this individual, or if there is an alternative individual that we, or the surgeon, should contact to provide updates and information. If family or friends come to the surgical information desk requesting information about you, who you have not listed with [place], no information will be given.   It may be helpful to designate someone as the main contact who will be responsible for updating your other friends and family.    PREADMIT TESTING  OFFICE: [phone] SAME DAY SURGERY: [phone] We look forward to caring for you before and throughout the process of your surgery.

## 2024-02-04 NOTE — H&P (View-Only) (Signed)
 Referring Physician:  Sherlene Shams, MD [address] [place],  [place] [address]  Primary Physician:  Sherlene Shams, MD  History of Present Illness: 02/04/2024 Kathleen Carr returns to see me today.  She has had a near 100% relief in her pain with her spinal cord stimulator trial.  She will have a removed on Monday.  11/17/2023 Kathleen Carr was scheduled today in error. Plan per prior note 10/06/2023.  10/06/2023 Kathleen Carr returns to see me.  She continues to have pain in her lower back and leg as noted below.  She has numbness that hits her right arm and right leg.  She has had some weakness in her right lower extremity.  11/27/2022 Kathleen Carr is here today with a chief complaint of low back and right buttock and groin pain.  She also has pain on her anterior thigh and in her right lateral calf.  She has been having this pain for several years.  She underwent a left-sided L4-5 microdiscectomy in 2021 for left leg pain.  That pain has improved, but her back pain is worsened over time.  Prolonged standing, walking, and sitting make it worse.  Changing positions helps temporarily.  Medications have helped a small amount.  She had a very bad reaction to an injection during her previous bout with sciatica, so is not interested in considering further injections.  Bowel/Bladder Dysfunction: none  Conservative measures:  Physical therapy:  has not participated for her back; has participated in for her right hip from 08/25/22 to 10/30/22, but it was making her back pain worse. Multimodal medical therapy including regular antiinflammatories:  tylenol, ibuprofen, mobic, tramadol, and flexeril, methocarbamol  Injections:  has received epidural steroid injections 01/09/21: Right L4-5 and L5-S1 facet joint injection   Past Surgery: Left L4/5 discectomy in 2021 with Dr. Reed Breech D. Maund has no symptoms of cervical myelopathy.  The symptoms are causing a significant impact on the  patient's life.   I have utilized the care everywhere function in epic to review the outside records available from external health systems.  Telephone visit with [name], PA-C on 11/13/2022: I spoke with Kathleen Carr today regarding pain back and leg pain.  She has been in physical therapy for her hip but is having increased pain and is not able to participate with this.  She would like to move forward with surgical consideration.  I did encourage her to contact the physical therapist and see if they are willing to formally discharge her from PT given her increased pain and inability to participate.  She will do that today.  I have also reviewed her imaging and symptoms with [name] who felt that a discussion of possible surgical intervention was reasonable.  Will get her scheduled to see [name] to discuss her options.  She was encouraged to call the office should she have any questions or concerns.  Office visit with [name], PA-C on 10/23/2022: Kathleen Carr is a 53 year old presenting today for further evaluation of persistent back pain.  She has since undergone her right hip surgery which has provided significant relief to her right groin pain however despite this she has persistent low back pain that is debilitating and unchanged in nature.  In addition to this she describes some discomfort in her left lateral leg.  She denies any other radiating leg symptoms.  She would like further evaluation and treatment of this.   Office visit with Kennyth Arnold  Doy Mince, PA-C on 07/22/2022: History of Left L5-S1 lumbar laminectomy in 2021 with [name].    History of bipolar, DM, GERD, HTN, and PTSD.    Last seen by [name] on 03/20/22 for LBP with right groin and anterior thigh pain.    MRI 02/21/22 showed multilevel degenerative changes the lumbar spine, worst at L4-L5 where there is asymmetric left disc bulging and facet arthropathy resulting in severe left-sided neural foraminal stenosis and  exiting nerve root impingement. Mild spinal canal and moderate right neural foraminal stenosis at this level.    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 Discussed lumbar ESI and PT, she wanted to hold off on these.    She is here for follow up.    She saw ortho and is scheduled for right hip surgery in October (labral tear and bone spurs per patient).    She has constant right > left sided LBP with some pulling in front right thigh and left posterior thigh. Leg pain is intermittent. Pain is worse with prolonged standing and walking. She has pain with carrying laundry basket. Some relief relief with changing position. No numbness or tingling. No weakness noted. No bowel or bladder issues.    She is on flexeril and ultram. She has minimal improvement with these. No recent lumbar ESIs (had prior to surgery in 2021). No recent PT for her lower back.   Office visit with [name], PA-C on 02/04/2022: Kathleen Carr is a 53 y.o with a history of diabetes (last A1c 7.2), HLD, and previous lumbar laminectomy who is here today with a chief complaint of low back pain and right radiating leg pain. She states this is been going on for about 2 years that any particular inciting event. She states that it feels like a crunching in her back when sitting upright and pulling radiating pain into her right groin and anterior thigh without extension below the knee. Her symptoms are worse with sitting for prolonged periods of time or walking for greater than 10 to 15 minutes and improves with laying flat on her back with her knees up. She states that this feels similar to the pain that she was having prior to her lumbar laminectomy in 2021. She did initially go back to [name] after onset of the symptoms but was told there is nothing they can do. She states that she has not had any recent MRI. She did undergo a couple weeks of physical therapy but was unable  to complete 6 weeks due to pain. She denies any similar left-sided symptoms. Of note she admits to about 1/2 pack per day of smoking     Review of Systems:  A 10 point review of systems is negative, except for the pertinent positives and negatives detailed in the HPI.  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   Pneumonia    PTSD (post-traumatic stress disorder)    Smoker    Thyroid disease     Past Surgical History: Past Surgical  History:  Procedure Laterality Date   ABDOMINAL HYSTERECTOMY  12/03/2011   complete   ADENOIDECTOMY, TONSILLECTOMY AND MYRINGOTOMY WITH TUBE PLACEMENT  1977   CARPAL TUNNEL RELEASE Right 03/12/2022   Procedure: RIGHT CARPAL TUNNEL RELEASE;  Surgeon: Marlyne Beards, MD;  Location: [facility];  Service: Orthopedics;  Laterality: Right;   CARPAL TUNNEL RELEASE Left 04/16/2022   Procedure: LEFT CARPAL TUNNEL RELEASE;  Surgeon: Marlyne Beards, MD;  Location: [facility];  Service: Orthopedics;  Laterality: Left;   CHOLECYSTECTOMY  06/08/2011   COLONOSCOPY WITH PROPOFOL N/A 02/05/2022   Procedure: COLONOSCOPY WITH PROPOFOL;  Surgeon: Wyline Mood, MD;  Location: [facility];  Service: Gastroenterology;  Laterality: N/A;   ESOPHAGOGASTRODUODENOSCOPY N/A 02/05/2022   Procedure: ESOPHAGOGASTRODUODENOSCOPY (EGD);  Surgeon: Wyline Mood, MD;  Location: [facility];  Service: Gastroenterology;  Laterality: N/A;   HIP ARTHROSCOPY Right 08/19/2022   Procedure: Right hip arthroscopy, acetabuloplasty, labral repair, femoral osteochondroplasty, capsular closure;  Surgeon:  Signa Kell, MD;  Location: [facility];  Service: Orthopedics;  Laterality: Right;   KNEE ARTHROSCOPY Right 1993   KNEE ARTHROSCOPY Left 1998   LUMBAR LAMINECTOMY Left 2021   TOTAL HIP ARTHROPLASTY Right 03/09/2023   Procedure: TOTAL HIP ARTHROPLASTY ANTERIOR APPROACH;  Surgeon: Reinaldo Berber, MD;  Location: [facility];  Service: Orthopedics;  Laterality: Right;    Allergies: Allergies as of 02/04/2024 - Review Complete 02/04/2024  Allergen Reaction Noted   Dust mite mixed allergen ext [mite (d. farinae)]  08/01/2020   Nsaids  12/31/2022   Other Hives 08/01/2020   Sulfa antibiotics Hives 06/24/2020   Latex Itching 08/12/2022   Misc. sulfonamide containing compounds Hives 05/05/2022    Medications: Current Meds  Medication Sig   albuterol (VENTOLIN HFA) 108 (90 Base) MCG/ACT inhaler Inhale 1-2 puffs into the lungs every 6 (six) hours as needed for wheezing or shortness of breath.   ALPRAZolam (XANAX) 0.25 MG tablet Take 1 tablet (0.25 mg total) by mouth at bedtime as needed for anxiety.   atorvastatin (LIPITOR) 20 MG tablet Take 1 tablet (20 mg total) by mouth daily.   cephALEXin (KEFLEX) 500 MG capsule Take 1 capsule (500 mg total) by mouth 4 (four) times daily for 7 days.   cyanocobalamin (VITAMIN B12) 1000 MCG/ML injection Inject 1 mL (1,000 mcg total) into the muscle every 30 (thirty) days.   docusate sodium (COLACE) 100 MG capsule Take 100 mg by mouth daily.   levothyroxine (SYNTHROID) 50 MCG tablet Take 1 tablet (50 mcg total) by mouth daily before breakfast.   loratadine (CLARITIN) 10 MG tablet Take 10 mg by mouth daily as needed.   metFORMIN (GLUCOPHAGE) 1000 MG tablet TAKE 1 TABLET BY MOUTH TWICE A DAY WITH FOOD   methocarbamol (ROBAXIN) 500 MG tablet Take 500 mg by mouth 3 (three) times daily.   ondansetron (ZOFRAN) 4 MG tablet Take 1 tablet (4 mg total) by mouth every 6 (six) hours as needed for nausea.   oxybutynin (DITROPAN XL) 15 MG 24 hr tablet TAKE 1 TABLET BY MOUTH  EVERYDAY AT BEDTIME   pantoprazole (PROTONIX) 40 MG tablet Take 1 tablet (40 mg total) by mouth daily.   QUEtiapine (SEROQUEL) 300 MG tablet TAKE 1 TABLET BY MOUTH EVERYDAY AT BEDTIME   rOPINIRole (REQUIP) 0.25 MG tablet TAKE 1 TABLET BY MOUTH THREE TIMES A DAY   sertraline (ZOLOFT) 100 MG tablet Take 1 tablet (100 mg total) by mouth daily.   Syringe/Needle, Disp, (SYRINGE 3CC/25GX1") 25G X 1" 3 ML MISC 1  Syringe by Does not apply route every 30 (thirty) days.    Social History: Social History   Tobacco Use   Smoking status: Former    Current packs/day: 0.00    Average packs/day: 0.5 packs/day for 42.0 years (21.0 ttl pk-yrs)    Types: Cigarettes    Start date: 01/25/1981    Quit date: 01/26/2023    Years since quitting: 1.0   Smokeless tobacco: Never  Vaping Use   Vaping status: Former   Substances: Nicotine  Substance Use Topics   Alcohol use: Not Currently   Drug use: Not Currently    Family Medical History: Family History  Problem Relation Age of Onset   Hypertension Mother    Thyroid disease Mother    Lung cancer Father 52       carcinoid, right lung   Ulcerative colitis Father    Breast cancer Paternal Aunt        x3 aunts   Glaucoma Maternal Grandmother    Renal Disease Maternal Grandfather    Diabetes Maternal Grandfather        Type2   Stroke Paternal Grandfather    Hypertension Paternal Grandfather     Physical Examination:   Medical Decision Making  Imaging: MRI L spine 02/21/2022 Disc levels:   T11-T12: No significant spinal canal or neural foraminal narrowing.   T12-L1: No significant spinal canal or neural foraminal narrowing.   L1-L2: Minimal disc bulging.  No significant stenosis.   L2-L3: Mild disc bulging, ligamentum flavum hypertrophy and bowel facet arthropathy. There is mild spinal canal stenosis and minimal bilateral neural foraminal narrowing.   L3-L4: Mild bilateral facet arthropathy. No significant spinal canal or neural foraminal  stenosis.   L4-L5: Asymmetric left disc bulging, ligament flavum hypertrophy mild facet arthropathy. There is mild spinal canal stenosis, severe left and moderate right neural foraminal stenosis.   L5-S1: No significant spinal canal or neural foraminal narrowing.   IMPRESSION: Multilevel degenerative changes the lumbar spine, worst at L4-L5 where there is asymmetric left disc bulging and facet arthropathy resulting in severe left-sided neural foraminal stenosis and exiting nerve root impingement. Mild spinal canal and moderate right neural foraminal stenosis at this level.   Milder degenerative changes from L1 through L4 as described above.     Electronically Signed   By: Caprice Renshaw M.D.   On: 02/21/2022 09:06  MRI CTL spine 09/01/2023 IMPRESSION: 1. New focus of increased T2 signal within the right hemicord at T4-T5, just below where a prominent central disc protrusion flattens the right ventral cord. 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 2. Otherwise unchanged multilevel thoracic spondylosis as described above. Unchanged mild spinal canal stenosis at T7-T8. 3. Unchanged trace bilateral pleural effusions.     Electronically Signed   By: Obie Dredge M.D.   On: 09/23/2023 08:50 FINDINGS: Alignment: Straightening of the normal cervical lordosis. No significant listhesis.   Vertebrae: No fracture, evidence of discitis, or bone lesion.   Cord: Normal signal and morphology.   Posterior Fossa, vertebral arteries, paraspinal tissues: Negative.   Disc levels:   C2-C3:  No significant disc bulge or herniation.  No stenosis.   C3-C4:  No significant disc bulge or herniation.  No stenosis.   C4-C5: No significant disc bulge or herniation. Moderate right and mild left facet uncovertebral hypertrophy. Moderate right neuroforaminal stenosis. No spinal canal or left neuroforaminal stenosis.   C5-C6: No significant disc bulge or herniation. Mild bilateral  facet uncovertebral hypertrophy. No stenosis.   C6-C7:  Mild disc bulging and bilateral uncovertebral hypertrophy. No stenosis.   C7-T1: Negative disc. Moderate right facet arthropathy. No stenosis.   IMPRESSION: 1. Multilevel cervical spondylosis as described above. Moderate right neuroforaminal stenosis at C4-C5.     Electronically Signed   By: Obie Dredge M.D.    On: 09/23/2023 08:55 L4-L5: Postsurgical change. Unchanged mild-to-moderate disc bulging with superimposed small left foraminal disc protrusion. Unchanged moderate bilateral facet arthropathy. Unchanged mild bilateral lateral recess stenosis. Unchanged mild right and moderate to severe left neuroforaminal stenosis. No spinal canal stenosis.   L5-S1: Negative disc. Unchanged moderate bilateral facet arthropathy. No stenosis.   IMPRESSION: 1. Unchanged multilevel lumbar spondylosis as described above. Unchanged moderate to severe left neuroforaminal stenosis at L4-L5.     Electronically Signed   By: Obie Dredge M.D.   On: 09/23/2023 09:03   I have personally reviewed the images and agree with the above interpretation.  Assessment and Plan: Kathleen Carr is a pleasant 53 y.o. female with back pain with right leg pain concerning for sciatica.  She has chronic pain syndrome due to postlaminectomy syndrome.  She has had an excellent response to spinal cord stimulator evaluation.  I think she is an excellent candidate for permanent placement.  She has passed a psychology evaluation.  Will proceed with thoracic laminectomy for spinal cord stimulator placement.  I discussed the planned procedure at length with the patient, including the risks, benefits, alternatives, and indications. The risks discussed include but are not limited to bleeding, infection, need for reoperation, spinal fluid leak, stroke, vision loss, anesthetic complication, coma, paralysis, and even death. I also described in detail that improvement  was not guaranteed.  The patient expressed understanding of these risks, and asked that we proceed with surgery. I described the surgery in layman's terms, and gave ample opportunity for questions, which were answered to the best of my ability.       Arwin Bisceglia K. Myer Haff MD, [facility] Neurosurgery

## 2024-02-08 ENCOUNTER — Encounter: Payer: Self-pay | Admitting: Student in an Organized Health Care Education/Training Program

## 2024-02-08 ENCOUNTER — Ambulatory Visit
Attending: Student in an Organized Health Care Education/Training Program | Admitting: Student in an Organized Health Care Education/Training Program

## 2024-02-08 ENCOUNTER — Ambulatory Visit
Admission: RE | Admit: 2024-02-08 | Discharge: 2024-02-08 | Disposition: A | Discharge status: Home / Self Care | Source: Ambulatory Visit | Attending: Student in an Organized Health Care Education/Training Program | Admitting: Student in an Organized Health Care Education/Training Program

## 2024-02-08 VITALS — BP 112/74 | HR 78 | Temp 97.1°F | Ht 63.0 in | Wt 154.0 lb

## 2024-02-08 DIAGNOSIS — M5416 Radiculopathy, lumbar region: Secondary | ICD-10-CM

## 2024-02-08 DIAGNOSIS — M961 Postlaminectomy syndrome, not elsewhere classified: Secondary | ICD-10-CM

## 2024-02-08 DIAGNOSIS — G8929 Other chronic pain: Secondary | ICD-10-CM

## 2024-02-08 NOTE — Progress Notes (Signed)
 [id number] Lead removal per Dr. Rhesa Celeste. Site clear. Leads intact. Wound care instructions given.

## 2024-02-08 NOTE — Progress Notes (Signed)
 PROVIDER NOTE: Interpretation of information contained herein should be left to medically-trained personnel. Specific patient instructions are provided elsewhere under "Patient Instructions" section of medical record. This document was created in part using AI and STT-dictation technology, any transcriptional errors that may result from this process are unintentional.  Patient: Kathleen Carr  Service: E/M   PCP: Sherlene Shams, MD  DOB: 1971-10-20  DOS: 02/08/2024  Provider: Edward Jolly, MD  MRN: [id number]  Delivery: Face-to-face  Specialty: Interventional Pain Management  Type: Established Patient  Setting: Ambulatory outpatient facility  Specialty designation: 09  Referring Prov.: Sherlene Shams, MD  Location: Outpatient office facility       HPI  Kathleen Carr, a 53 y.o. year old female, is here today because of her Failed back surgical syndrome [M96.1]. Kathleen Carr primary complain today is Back Pain Rana Snare back surgical site pain)   Pain Assessment: Severity of Chronic pain is reported as a 5 /10. Location: Back Lower/surgical site. Onset: More than a month ago. Quality: Burning. Timing: Constant. Modifying factor(s): nothinig. Vitals:  height is 5\' 3"  (1.6 m) and weight is 154 lb (69.9 kg). Her temperature is 97.1 F (36.2 C) (abnormal). Her blood pressure is 112/74 and her pulse is 78. Her oxygen saturation is 100%.  BMI: Estimated body mass index is 27.28 kg/m as calculated from the following:   Height as of this encounter: 5\' 3"  (1.6 m).   Weight as of this encounter: 154 lb (69.9 kg). Last encounter: 02/03/2024. Last procedure: 02/01/2024.  Reason for encounter:   Status post successful Medtronic spinal cord stimulator trial.  Patient endorses approximately 80 to 90% pain relief during SCS trial and improvement in her functional abilities.  She would like to move forward with permanent implant.   ROS  Constitutional: Denies any fever or chills Gastrointestinal: No reported  hemesis, hematochezia, vomiting, or acute GI distress Musculoskeletal: Denies any acute onset joint swelling, redness, loss of ROM, or weakness Neurological: No reported episodes of acute onset apraxia, aphasia, dysarthria, agnosia, amnesia, paralysis, loss of coordination, or loss of consciousness  Medication Review  ALPRAZolam, Magnesium Glycinate, QUEtiapine, SYRINGE 3CC/25GX1", albuterol, atorvastatin, cephALEXin, cyanocobalamin, docusate sodium, levothyroxine, loratadine, metFORMIN, methocarbamol, ondansetron, oxyCODONE, oxybutynin, pantoprazole, rOPINIRole, sertraline, simvastatin, and sitaGLIPtin  History Review  Allergy: Ms. Mackins is allergic to dust mite mixed allergen ext [mite (d. farinae)], nsaids, other, sulfa antibiotics, latex, misc. sulfonamide containing compounds, and wound dressing adhesive. Drug: Kathleen Carr  reports that she does not currently use drugs. Alcohol:  reports that she does not currently use alcohol. Tobacco:  reports that she quit smoking about a year ago. Her smoking use included cigarettes. She started smoking about 43 years ago. She has a 21 pack-year smoking history. She has never used smokeless tobacco. Social: Ms. Sturdivant  reports that she quit smoking about a year ago. Her smoking use included cigarettes. She started smoking about 43 years ago. She has a 21 pack-year smoking history. She has never used smokeless tobacco. She reports that she does not currently use alcohol. She reports that she does not currently use drugs. 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 Surgical: Ms.  Carr  has a past surgical history that includes Abdominal hysterectomy (12/03/2011); Adenoidectomy, tonsillectomy  and myringotomy with tube placement (1977); Cholecystectomy (06/08/2011); Knee arthroscopy (Right, 1993); Colonoscopy with propofol (N/A, 02/05/2022); Esophagogastroduodenoscopy (N/A, 02/05/2022); Carpal tunnel release (Right, 03/12/2022); Carpal tunnel release (Left, 04/16/2022); Hip arthroscopy (Right, 08/19/2022); Lumbar laminectomy (Left, 2021); Knee arthroscopy (Left, 1998); and Total hip arthroplasty (Right, 03/09/2023). Family: family history includes Breast cancer in her paternal aunt; Diabetes in her maternal grandfather; Glaucoma in her maternal grandmother; Hypertension in her mother and paternal grandfather; Lung cancer (age of onset: 36) in her father; Renal Disease in her maternal grandfather; Stroke in her paternal grandfather; Thyroid disease in her mother; Ulcerative colitis in her father.  Laboratory Chemistry Profile   Renal Lab Results  Component Value Date   BUN 10 02/02/2024   CREATININE 1.00 02/02/2024   LABCREA 79 09/18/2023   BCR NOT APPLICABLE 07/19/2021   GFR 64.84 02/02/2024   GFRAA 90 08/02/2020   GFRNONAA >60 04/24/2023    Hepatic Lab Results  Component Value Date   AST 15 02/02/2024   ALT 11 02/02/2024   ALBUMIN 4.5 02/02/2024   ALKPHOS 55 02/02/2024   AMYLASE 33 01/03/2022   LIPASE 46.0 01/03/2022   AMMONIA 21 04/22/2023    Electrolytes Lab Results  Component Value Date   NA 142 02/02/2024   K 4.1 02/02/2024   CL 106 02/02/2024   CALCIUM 9.2 02/02/2024   PHOS 2.7 12/31/2022    Bone Lab Results  Component Value Date   VD25OH 32.72 07/08/2023    Inflammation (CRP: Acute Phase) (ESR: Chronic Phase) Lab Results  Component Value Date   CRP <1.0 12/31/2022   ESRSEDRATE 26 01/03/2022   LATICACIDVEN 1.1 04/23/2023         Note: Above Lab results reviewed.  Recent Imaging Review  DG PAIN CLINIC C-ARM 1-60 MIN NO REPORT Fluoro was  used, but no Radiologist interpretation will be provided.  Please refer to "NOTES" tab for provider progress note. Note: Reviewed        Physical Exam  General appearance: Well nourished, well developed, and well hydrated. In no apparent acute distress Mental status: Alert, oriented x 3 (person, place, & time)       Respiratory: No evidence of acute respiratory distress Eyes: PERLA Vitals: BP 112/74   Pulse 78   Temp (!) 97.1 F (36.2 C)   Ht 5\' 3"  (1.6 m)   Wt 154 lb (69.9 kg)   SpO2 100%   BMI 27.28 kg/m  BMI: Estimated body mass index is 27.28 kg/m as calculated from the following:   Height as of this encounter: 5\' 3"  (1.6 m).   Weight as of this encounter: 154 lb (69.9 kg). Ideal: Ideal body weight: 52.4 kg (115 lb 8.3 oz) Adjusted ideal body weight: 59.4 kg (130 lb 14.6 oz)  Assessment   Diagnosis Status  1. Failed back surgical syndrome   2. Lumbar post-laminectomy syndrome   3. Chronic radicular lumbar pain    Controlled Controlled Controlled   Updated Problems: No problems updated.  Plan of Care  Referral to Dr. Jeris Montes for permanent SCS implant Orders:  Orders Placed This Encounter  Procedures   DG PAIN CLINIC C-ARM 1-60 MIN NO REPORT    Intraoperative interpretation by procedural physician at [facility].    Standing Status:   Standing    Number of Occurrences:   1    Reason for exam::   Assistance in needle guidance and placement for procedures requiring needle placement in or near specific anatomical locations not easily accessible without such  assistance.   Ambulatory referral to Neurosurgery    Referral Priority:   Routine    Referral Type:   Surgical    Referral Reason:   Specialty Services Required    Referred to Provider:   Jodeen Munch, MD    Requested Specialty:   Neurosurgery    Number of Visits Requested:   1   Follow-up plan:   Return if symptoms worsen or fail to improve.    Recent Visits Date Type Provider Dept   02/03/24 Office Visit Cephus Collin, MD [facility]  02/01/24 Procedure visit Cephus Collin, MD [facility]  12/01/23 Office Visit Cephus Collin, MD [facility]  Showing recent visits within past 90 days and meeting all other requirements Today's Visits Date Type Provider Dept  02/08/24 Procedure visit Cephus Collin, MD [facility]  Showing today's visits and meeting all other requirements Future Appointments No visits were found meeting these conditions. Showing future appointments within next 90 days and meeting all other requirements  I discussed the assessment and treatment plan with the patient. The patient was provided an opportunity to ask questions and all were answered. The patient agreed with the plan and demonstrated an understanding of the instructions.  Patient advised to call back or seek an in-person evaluation if the symptoms or condition worsens.  Duration of encounter: .  Total time on encounter, as per AMA guidelines included both the face-to-face and non-face-to-face time personally spent by the physician and/or other qualified health care professional(s) on the day of the encounter (includes time in activities that require the physician or other qualified health care professional and does not include time in activities normally performed by clinical staff). Physician's time may include the following activities when performed: Preparing to see the patient (e.g., pre-charting review of records, searching for previously ordered imaging, lab work, and nerve conduction tests) Review of prior analgesic pharmacotherapies. Reviewing PMP Interpreting ordered tests (e.g., lab work, imaging, nerve conduction tests) Performing post-procedure evaluations, including interpretation of diagnostic procedures Obtaining and/or reviewing separately obtained history Performing a medically appropriate examination and/or evaluation Counseling  and educating the patient/family/caregiver Ordering medications, tests, or procedures Referring and communicating with other health care professionals (when not separately reported) Documenting clinical information in the electronic or other health record Independently interpreting results (not separately reported) and communicating results to the patient/ family/caregiver Care coordination (not separately reported)  Note by: Cephus Collin, MD (TTS and AI technology used. I apologize for any typographical errors that were not detected and corrected.) Date: 02/08/2024; Time: 8:49 AM

## 2024-02-08 NOTE — Progress Notes (Signed)
 Safety precautions to be maintained throughout the outpatient stay will include: orient to surroundings, keep bed in low position, maintain call bell within reach at all times, provide assistance with transfer out of bed and ambulation.

## 2024-02-09 ENCOUNTER — Encounter: Payer: Self-pay | Admitting: Neurosurgery

## 2024-02-09 ENCOUNTER — Other Ambulatory Visit: Payer: Self-pay

## 2024-02-09 ENCOUNTER — Encounter
Admission: RE | Admit: 2024-02-09 | Discharge: 2024-02-09 | Disposition: A | Discharge status: Home / Self Care | Payer: Self-pay | Source: Ambulatory Visit | Attending: Neurosurgery | Admitting: Neurosurgery

## 2024-02-09 VITALS — BP 130/82 | HR 71 | Temp 97.8°F | Ht 63.0 in | Wt 166.4 lb

## 2024-02-09 DIAGNOSIS — R52 Pain, unspecified: Secondary | ICD-10-CM

## 2024-02-09 DIAGNOSIS — Z01812 Encounter for preprocedural laboratory examination: Secondary | ICD-10-CM

## 2024-02-09 DIAGNOSIS — Z01818 Encounter for other preprocedural examination: Secondary | ICD-10-CM | POA: Diagnosis present

## 2024-02-09 DIAGNOSIS — Z96641 Presence of right artificial hip joint: Secondary | ICD-10-CM | POA: Diagnosis not present

## 2024-02-09 DIAGNOSIS — E119 Type 2 diabetes mellitus without complications: Secondary | ICD-10-CM | POA: Diagnosis not present

## 2024-02-09 DIAGNOSIS — M25551 Pain in right hip: Secondary | ICD-10-CM | POA: Insufficient documentation

## 2024-02-09 DIAGNOSIS — G8929 Other chronic pain: Secondary | ICD-10-CM | POA: Insufficient documentation

## 2024-02-09 DIAGNOSIS — Z8673 Personal history of transient ischemic attack (TIA), and cerebral infarction without residual deficits: Secondary | ICD-10-CM

## 2024-02-09 LAB — URINALYSIS, COMPLETE (UACMP) WITH MICROSCOPIC
Bilirubin Urine: NEGATIVE
Glucose, UA: NEGATIVE mg/dL
Hgb urine dipstick: NEGATIVE
Ketones, ur: NEGATIVE mg/dL
Nitrite: NEGATIVE
Protein, ur: NEGATIVE mg/dL
Specific Gravity, Urine: 1.003 — ABNORMAL LOW (ref 1.005–1.030)
pH: 5 (ref 5.0–8.0)

## 2024-02-09 LAB — SURGICAL PCR SCREEN
MRSA, PCR: NEGATIVE
Staphylococcus aureus: NEGATIVE

## 2024-02-09 LAB — TYPE AND SCREEN
ABO/RH(D): O POS
Antibody Screen: NEGATIVE

## 2024-02-09 NOTE — Patient Instructions (Addendum)
 Your procedure is scheduled on: 02/17/2024 Wednesday  Report to the Registration Desk on the 1st floor of the [facility]. To find out your arrival time, please call [phone] between 1PM - 3PM on: Tuesday, April 22  If your arrival time is 6:00 am, do not arrive before that time as the [facility] entrance doors do not open until 6:00 am.  REMEMBER: Instructions that are not followed completely may result in serious medical risk, up to and including death; or upon the discretion of your surgeon and anesthesiologist your surgery may need to be rescheduled.  Do not eat food after midnight the night before surgery.  No gum chewing or hard candies.  You may however, drink CLEAR liquids up to 2 hours before you are scheduled to arrive for your surgery. Do not drink anything within 2 hours of your scheduled arrival time.  Clear liquids include: - water    One week prior to surgery: Stop Anti-inflammatories (NSAIDS) such as Advil, Aleve, Ibuprofen, Motrin, Naproxen, Naprosyn and Aspirin based products such as Excedrin, Goody's Powder, BC Powder. Stop ANY OVER THE COUNTER supplements until after surgery.  You may however, continue to take Tylenol if needed for pain up until the day of surgery.   Continue taking all of your other prescription medications up until the day of surgery.  ON THE DAY OF SURGERY ONLY TAKE THESE MEDICATIONS WITH SIPS OF WATER:       No Alcohol for 24 hours before or after surgery.  No Smoking including e-cigarettes for 24 hours before surgery.  No chewable tobacco products for at least 6 hours before surgery.  No nicotine patches on the day of surgery.  Do not use any "recreational" drugs for at least a week (preferably 2 weeks) before your surgery.  Please be advised that the combination of cocaine and anesthesia may have negative outcomes, up to and including death. If you test positive for cocaine, your surgery will be cancelled.  On the morning  of surgery brush your teeth with toothpaste and water, you may rinse your mouth with mouthwash if you wish. Do not swallow any toothpaste or mouthwash.  Use CHG Soap or wipes as directed on instruction sheet.-provided for you   Do not wear jewelry, make-up, hairpins, clips or nail polish.  For welded (permanent) jewelry: bracelets, anklets, waist bands, etc.  Please have this removed prior to surgery.  If it is not removed, there is a chance that hospital personnel will need to cut it off on the day of surgery.  Do not wear lotions, powders, or perfumes.   Do not shave body hair from the neck down 48 hours before surgery.  Contact lenses, hearing aids and dentures may not be worn into surgery.  Do not bring valuables to the hospital. [facility] is not responsible for any missing/lost belongings or valuables.     Notify your doctor if there is any change in your medical condition (cold, fever, infection).  Wear comfortable clothing (specific to your surgery type) to the hospital.  After surgery, you can help prevent lung complications by doing breathing exercises.  Take deep breaths and cough every 1-2 hours. Your doctor may order a device called an Incentive Spirometer to help you take deep breaths. If you are being admitted to the hospital overnight, leave your suitcase in the car. After surgery it may be brought to your room.  In case of increased patient census, it may be necessary for you, the patient, to  continue your postoperative care in the Same Day Surgery department.  If you are being discharged the day of surgery, you will not be allowed to drive home. You will need a responsible individual to drive you home and stay with you for 24 hours after surgery.     Please call the Pre-admissions Testing Dept. at [phone] if you have any questions about these instructions.  Surgery Visitation Policy:  Patients having surgery or a procedure may have two visitors.   Children under the age of 26 must have an adult with them who is not the patient.  Inpatient Visitation:    Visiting hours are 7 a.m. to 8 p.m. Up to four visitors are allowed at one time in a patient room. The visitors may rotate out with other people during the day.  One visitor age 47 or older may stay with the patient overnight and must be in the room by 8 p.m.     Pre-operative 5 CHG Bath Instructions   You can play a key role in reducing the risk of infection after surgery. Your skin needs to be as free of germs as possible. You can reduce the number of germs on your skin by washing with CHG (chlorhexidine gluconate) soap before surgery. CHG is an antiseptic soap that kills germs and continues to kill germs even after washing.   DO NOT use if you have an allergy to chlorhexidine/CHG or antibacterial soaps. If your skin becomes reddened or irritated, stop using the CHG and notify one of our RNs at [phone].   Please shower with the CHG soap starting 4 days before surgery using the following schedule:     Please keep in mind the following:  DO NOT shave, including legs and underarms, starting the day of your first shower.   You may shave your face at any point before/day of surgery.  Place clean sheets on your bed the day you start using CHG soap. Use a clean washcloth (not used since being washed) for each shower. DO NOT sleep with pets once you start using the CHG.   CHG Shower Instructions:  If you choose to wash your hair and private area, wash first with your normal shampoo/soap.  After you use shampoo/soap, rinse your hair and body thoroughly to remove shampoo/soap residue.  Turn the water OFF and apply about 3 tablespoons (45 ml) of CHG soap to a CLEAN washcloth.  Apply CHG soap ONLY FROM YOUR NECK DOWN TO YOUR TOES (washing for 3-5 minutes)  DO NOT use CHG soap on face, private areas, open wounds, or sores.  Pay special attention to the area where your surgery is  being performed.  If you are having back surgery, having someone wash your back for you may be helpful. Wait 2 minutes after CHG soap is applied, then you may rinse off the CHG soap.  Pat dry with a clean towel  Put on clean clothes/pajamas   If you choose to wear lotion, please use ONLY the CHG-compatible lotions on the back of this paper.     Additional instructions for the day of surgery: DO NOT APPLY any lotions, deodorants, cologne, or perfumes.   Put on clean/comfortable clothes.  Brush your teeth.  Ask your nurse before applying any prescription medications to the skin.      CHG Compatible Lotions   Aveeno Moisturizing lotion  Cetaphil Moisturizing Cream  Cetaphil Moisturizing Lotion  Clairol Herbal Essence Moisturizing Lotion, Dry Skin  Clairol Herbal Essence Moisturizing  Lotion, Extra Dry Skin  Clairol Herbal Essence Moisturizing Lotion, Normal Skin  Curel Age Defying Therapeutic Moisturizing Lotion with Alpha Hydroxy  Curel Extreme Care Body Lotion  Curel Soothing Hands Moisturizing Hand Lotion  Curel Therapeutic Moisturizing Cream, Fragrance-Free  Curel Therapeutic Moisturizing Lotion, Fragrance-Free  Curel Therapeutic Moisturizing Lotion, Original Formula  Eucerin Daily Replenishing Lotion  Eucerin Dry Skin Therapy Plus Alpha Hydroxy Crme  Eucerin Dry Skin Therapy Plus Alpha Hydroxy Lotion  Eucerin Original Crme  Eucerin Original Lotion  Eucerin Plus Crme Eucerin Plus Lotion  Eucerin TriLipid Replenishing Lotion  Keri Anti-Bacterial Hand Lotion  Keri Deep Conditioning Original Lotion Dry Skin Formula Softly Scented  Keri Deep Conditioning Original Lotion, Fragrance Free Sensitive Skin Formula  Keri Lotion Fast Absorbing Fragrance Free Sensitive Skin Formula  Keri Lotion Fast Absorbing Softly Scented Dry Skin Formula  Keri Original Lotion  Keri Skin Renewal Lotion Keri Silky Smooth Lotion  Keri Silky Smooth Sensitive Skin Lotion  Nivea Body Creamy  Conditioning Oil  Nivea Body Extra Enriched Teacher, adult education Moisturizing Lotion Nivea Crme  Nivea Skin Firming Lotion  NutraDerm 30 Skin Lotion  NutraDerm Skin Lotion  NutraDerm Therapeutic Skin Cream  NutraDerm Therapeutic Skin Lotion  ProShield Protective Hand Cream  Provon moisturizing lotion

## 2024-02-15 ENCOUNTER — Inpatient Hospital Stay: Admitting: Oncology

## 2024-02-15 ENCOUNTER — Inpatient Hospital Stay

## 2024-02-16 MED ORDER — ORAL CARE MOUTH RINSE: 15.0000 mL | Freq: Once | OROMUCOSAL | Status: AC

## 2024-02-16 MED ORDER — SODIUM CHLORIDE 0.9 % IV SOLN: INTRAVENOUS | Status: DC

## 2024-02-16 MED ORDER — CHLORHEXIDINE GLUCONATE 0.12 % MT SOLN
15.0000 mL | Freq: Once | OROMUCOSAL | Status: AC
  Administered 2024-02-17: 15 mL via OROMUCOSAL

## 2024-02-16 MED ORDER — CEFAZOLIN SODIUM-DEXTROSE 2-4 GM/100ML-% IV SOLN
2.0000 g | INTRAVENOUS | Status: AC
Start: 2024-02-17 — End: 2024-02-17
  Administered 2024-02-17: 2 g via INTRAVENOUS

## 2024-02-16 MED ORDER — VANCOMYCIN HCL IN DEXTROSE 1-5 GM/200ML-% IV SOLN
1000.0000 mg | Freq: Once | INTRAVENOUS | Status: AC
Start: 2024-02-16 — End: 2024-02-17
  Administered 2024-02-17: 1000 mg via INTRAVENOUS

## 2024-02-16 MED ORDER — CEFAZOLIN IN SODIUM CHLORIDE 2-0.9 GM/100ML-% IV SOLN
2.0000 g | Freq: Once | INTRAVENOUS | Status: DC
  Filled 2024-02-16: qty 100

## 2024-02-17 ENCOUNTER — Ambulatory Visit: Admitting: Anesthesiology

## 2024-02-17 ENCOUNTER — Encounter: Payer: Self-pay | Admitting: Neurosurgery

## 2024-02-17 ENCOUNTER — Ambulatory Visit

## 2024-02-17 ENCOUNTER — Other Ambulatory Visit: Payer: Self-pay

## 2024-02-17 ENCOUNTER — Ambulatory Visit
Admission: RE | Admit: 2024-02-17 | Discharge: 2024-02-17 | Disposition: A | Discharge status: Home / Self Care | Attending: Neurosurgery | Admitting: Neurosurgery

## 2024-02-17 ENCOUNTER — Encounter
Admission: RE | Disposition: A | Discharge status: Home / Self Care | Payer: Self-pay | Source: Home / Self Care | Attending: Neurosurgery

## 2024-02-17 DIAGNOSIS — M961 Postlaminectomy syndrome, not elsewhere classified: Secondary | ICD-10-CM

## 2024-02-17 DIAGNOSIS — J45909 Unspecified asthma, uncomplicated: Secondary | ICD-10-CM | POA: Insufficient documentation

## 2024-02-17 DIAGNOSIS — Z7984 Long term (current) use of oral hypoglycemic drugs: Secondary | ICD-10-CM | POA: Insufficient documentation

## 2024-02-17 DIAGNOSIS — E039 Hypothyroidism, unspecified: Secondary | ICD-10-CM | POA: Diagnosis not present

## 2024-02-17 DIAGNOSIS — F419 Anxiety disorder, unspecified: Secondary | ICD-10-CM | POA: Insufficient documentation

## 2024-02-17 DIAGNOSIS — Z79890 Hormone replacement therapy (postmenopausal): Secondary | ICD-10-CM | POA: Insufficient documentation

## 2024-02-17 DIAGNOSIS — Z87891 Personal history of nicotine dependence: Secondary | ICD-10-CM | POA: Diagnosis not present

## 2024-02-17 DIAGNOSIS — I10 Essential (primary) hypertension: Secondary | ICD-10-CM | POA: Insufficient documentation

## 2024-02-17 DIAGNOSIS — Z01812 Encounter for preprocedural laboratory examination: Secondary | ICD-10-CM

## 2024-02-17 DIAGNOSIS — K219 Gastro-esophageal reflux disease without esophagitis: Secondary | ICD-10-CM | POA: Insufficient documentation

## 2024-02-17 DIAGNOSIS — E119 Type 2 diabetes mellitus without complications: Secondary | ICD-10-CM | POA: Diagnosis not present

## 2024-02-17 DIAGNOSIS — F319 Bipolar disorder, unspecified: Secondary | ICD-10-CM | POA: Diagnosis not present

## 2024-02-17 DIAGNOSIS — F4310 Post-traumatic stress disorder, unspecified: Secondary | ICD-10-CM | POA: Diagnosis not present

## 2024-02-17 DIAGNOSIS — Z01818 Encounter for other preprocedural examination: Secondary | ICD-10-CM

## 2024-02-17 DIAGNOSIS — G894 Chronic pain syndrome: Secondary | ICD-10-CM | POA: Diagnosis present

## 2024-02-17 HISTORY — DX: Postlaminectomy syndrome, not elsewhere classified: M96.1

## 2024-02-17 HISTORY — PX: THORACIC LAMINECTOMY FOR SPINAL CORD STIMULATOR: SHX6887

## 2024-02-17 HISTORY — DX: Chronic pain syndrome: G89.4

## 2024-02-17 LAB — GLUCOSE, CAPILLARY
Glucose-Capillary: 106 mg/dL — ABNORMAL HIGH (ref 70–99)
Glucose-Capillary: 96 mg/dL (ref 70–99)

## 2024-02-17 SURGERY — THORACIC LAMINECTOMY FOR SPINAL CORD STIMULATOR
Anesthesia: General | Site: Thoracic

## 2024-02-17 MED ORDER — BUPIVACAINE HCL (PF) 0.5 % IJ SOLN
INTRAMUSCULAR | Status: AC
  Filled 2024-02-17: qty 30

## 2024-02-17 MED ORDER — LACTATED RINGERS IV SOLN: INTRAVENOUS | Status: DC

## 2024-02-17 MED ORDER — LIDOCAINE HCL (CARDIAC) PF 100 MG/5ML IV SOSY
PREFILLED_SYRINGE | INTRAVENOUS | Status: DC | PRN
  Administered 2024-02-17: 100 mg via INTRAVENOUS

## 2024-02-17 MED ORDER — PHENYLEPHRINE 80 MCG/ML (10ML) SYRINGE FOR IV PUSH (FOR BLOOD PRESSURE SUPPORT)
PREFILLED_SYRINGE | INTRAVENOUS | Status: DC | PRN
  Administered 2024-02-17: 160 ug via INTRAVENOUS

## 2024-02-17 MED ORDER — BUPIVACAINE-EPINEPHRINE (PF) 0.5% -1:200000 IJ SOLN
INTRAMUSCULAR | Status: AC
  Filled 2024-02-17: qty 10

## 2024-02-17 MED ORDER — PROPOFOL 10 MG/ML IV BOLUS
INTRAVENOUS | Status: DC | PRN
  Administered 2024-02-17: 200 mg via INTRAVENOUS

## 2024-02-17 MED ORDER — PROPOFOL 1000 MG/100ML IV EMUL
INTRAVENOUS | Status: AC
Start: 2024-02-17 — End: ?
  Filled 2024-02-17: qty 100

## 2024-02-17 MED ORDER — CHLORHEXIDINE GLUCONATE 0.12 % MT SOLN
OROMUCOSAL | Status: AC
  Filled 2024-02-17: qty 15

## 2024-02-17 MED ORDER — FENTANYL CITRATE (PF) 100 MCG/2ML IJ SOLN
INTRAMUSCULAR | Status: DC | PRN
  Administered 2024-02-17 (×2): 50 ug via INTRAVENOUS

## 2024-02-17 MED ORDER — REMIFENTANIL HCL 1 MG IV SOLR
INTRAVENOUS | Status: AC
  Filled 2024-02-17: qty 1000

## 2024-02-17 MED ORDER — SUCCINYLCHOLINE CHLORIDE 200 MG/10ML IV SOSY
PREFILLED_SYRINGE | INTRAVENOUS | Status: AC
  Filled 2024-02-17: qty 10

## 2024-02-17 MED ORDER — 0.9 % SODIUM CHLORIDE (POUR BTL) OPTIME
TOPICAL | Status: DC | PRN
Start: 2024-02-17 — End: 2024-02-17
  Administered 2024-02-17: 500 mL

## 2024-02-17 MED ORDER — MIDAZOLAM HCL 2 MG/2ML IJ SOLN
INTRAMUSCULAR | Status: AC
  Filled 2024-02-17: qty 2

## 2024-02-17 MED ORDER — DEXAMETHASONE SODIUM PHOSPHATE 10 MG/ML IJ SOLN
INTRAMUSCULAR | Status: DC | PRN
  Administered 2024-02-17: 10 mg via INTRAVENOUS

## 2024-02-17 MED ORDER — FENTANYL CITRATE (PF) 100 MCG/2ML IJ SOLN
INTRAMUSCULAR | Status: AC
  Filled 2024-02-17: qty 2

## 2024-02-17 MED ORDER — ACETAMINOPHEN 10 MG/ML IV SOLN
INTRAVENOUS | Status: AC
  Filled 2024-02-17: qty 100

## 2024-02-17 MED ORDER — VANCOMYCIN HCL IN DEXTROSE 1-5 GM/200ML-% IV SOLN
INTRAVENOUS | Status: AC
  Filled 2024-02-17: qty 200

## 2024-02-17 MED ORDER — PHENYLEPHRINE 80 MCG/ML (10ML) SYRINGE FOR IV PUSH (FOR BLOOD PRESSURE SUPPORT)
PREFILLED_SYRINGE | INTRAVENOUS | Status: AC
  Filled 2024-02-17: qty 10

## 2024-02-17 MED ORDER — FENTANYL CITRATE (PF) 100 MCG/2ML IJ SOLN
25.0000 ug | INTRAMUSCULAR | Status: DC | PRN
  Administered 2024-02-17 (×3): 50 ug via INTRAVENOUS

## 2024-02-17 MED ORDER — PROPOFOL 500 MG/50ML IV EMUL
INTRAVENOUS | Status: DC | PRN
  Administered 2024-02-17: 150 ug/kg/min via INTRAVENOUS

## 2024-02-17 MED ORDER — SODIUM CHLORIDE 0.9 % IV SOLN
INTRAVENOUS | Status: DC | PRN
  Administered 2024-02-17: .15 ug/kg/min via INTRAVENOUS

## 2024-02-17 MED ORDER — ACETAMINOPHEN 10 MG/ML IV SOLN
INTRAVENOUS | Status: DC | PRN
  Administered 2024-02-17: 1000 mg via INTRAVENOUS

## 2024-02-17 MED ORDER — BUPIVACAINE LIPOSOME 1.3 % IJ SUSP
INTRAMUSCULAR | Status: AC
  Filled 2024-02-17: qty 20

## 2024-02-17 MED ORDER — ACETAMINOPHEN 10 MG/ML IV SOLN: 1000.0000 mg | Freq: Once | INTRAVENOUS | Status: DC | PRN

## 2024-02-17 MED ORDER — IRRISEPT - 450ML BOTTLE WITH 0.05% CHG IN STERILE WATER, USP 99.95% OPTIME
TOPICAL | Status: DC | PRN
  Administered 2024-02-17: 450 mL via TOPICAL

## 2024-02-17 MED ORDER — OXYCODONE HCL 5 MG PO TABS
5.0000 mg | ORAL_TABLET | Freq: Once | ORAL | Status: AC | PRN
  Administered 2024-02-17: 5 mg via ORAL

## 2024-02-17 MED ORDER — EPHEDRINE SULFATE-NACL 50-0.9 MG/10ML-% IV SOSY
PREFILLED_SYRINGE | INTRAVENOUS | Status: DC | PRN
  Administered 2024-02-17 (×3): 5 mg via INTRAVENOUS

## 2024-02-17 MED ORDER — ONDANSETRON HCL 4 MG/2ML IJ SOLN
INTRAMUSCULAR | Status: AC
  Filled 2024-02-17: qty 2

## 2024-02-17 MED ORDER — SURGIFLO WITH THROMBIN (HEMOSTATIC MATRIX KIT) OPTIME
TOPICAL | Status: DC | PRN
  Administered 2024-02-17: 1 via TOPICAL

## 2024-02-17 MED ORDER — SODIUM CHLORIDE (PF) 0.9 % IJ SOLN
INTRAMUSCULAR | Status: AC
  Filled 2024-02-17: qty 20

## 2024-02-17 MED ORDER — SUCCINYLCHOLINE CHLORIDE 200 MG/10ML IV SOSY
PREFILLED_SYRINGE | INTRAVENOUS | Status: DC | PRN
  Administered 2024-02-17: 100 mg via INTRAVENOUS

## 2024-02-17 MED ORDER — SODIUM CHLORIDE (PF) 0.9 % IJ SOLN
INTRAMUSCULAR | Status: DC | PRN
  Administered 2024-02-17: 60 mL via INTRAMUSCULAR

## 2024-02-17 MED ORDER — ONDANSETRON HCL 4 MG/2ML IJ SOLN
INTRAMUSCULAR | Status: DC | PRN
  Administered 2024-02-17: 4 mg via INTRAVENOUS

## 2024-02-17 MED ORDER — OXYCODONE-ACETAMINOPHEN 5-325 MG PO TABS
1.0000 | ORAL_TABLET | ORAL | 0 refills | Status: AC | PRN
Start: 2024-02-17 — End: 2024-02-22

## 2024-02-17 MED ORDER — DEXAMETHASONE SODIUM PHOSPHATE 10 MG/ML IJ SOLN
INTRAMUSCULAR | Status: AC
  Filled 2024-02-17: qty 1

## 2024-02-17 MED ORDER — EPHEDRINE 5 MG/ML INJ
INTRAVENOUS | Status: AC
  Filled 2024-02-17: qty 5

## 2024-02-17 MED ORDER — MIDAZOLAM HCL 2 MG/2ML IJ SOLN
INTRAMUSCULAR | Status: DC | PRN
  Administered 2024-02-17: 2 mg via INTRAVENOUS

## 2024-02-17 MED ORDER — METHOCARBAMOL 500 MG PO TABS: 500.0000 mg | ORAL_TABLET | Freq: Four times a day (QID) | ORAL | 0 refills | Status: DC | PRN

## 2024-02-17 MED ORDER — BUPIVACAINE-EPINEPHRINE (PF) 0.5% -1:200000 IJ SOLN
INTRAMUSCULAR | Status: DC | PRN
  Administered 2024-02-17: 10 mL

## 2024-02-17 MED ORDER — CEFAZOLIN SODIUM-DEXTROSE 2-4 GM/100ML-% IV SOLN
INTRAVENOUS | Status: AC
  Filled 2024-02-17: qty 100

## 2024-02-17 MED ORDER — OXYCODONE HCL 5 MG PO TABS
ORAL_TABLET | ORAL | Status: AC
  Filled 2024-02-17: qty 1

## 2024-02-17 MED ORDER — OXYCODONE HCL 5 MG/5ML PO SOLN: 5.0000 mg | Freq: Once | ORAL | Status: AC | PRN

## 2024-02-17 MED ORDER — SENNA 8.6 MG PO TABS
1.0000 | ORAL_TABLET | Freq: Two times a day (BID) | ORAL | 0 refills | Status: DC | PRN
Start: 2024-02-17 — End: 2024-07-07

## 2024-02-17 MED ORDER — ONDANSETRON HCL 4 MG/2ML IJ SOLN: 4.0000 mg | Freq: Once | INTRAMUSCULAR | Status: DC | PRN

## 2024-02-17 SURGICAL SUPPLY — 38 items
BELT PT INTERSTIM MICRO SYSTEM (MISCELLANEOUS) IMPLANT
BUR NEURO DRILL SOFT 3.0X3.8M (BURR) ×1 IMPLANT
CONTROLLER HANDSET COMM KIT (NEUROSURGERY SUPPLIES) IMPLANT
DERMABOND ADVANCED .7 DNX12 (GAUZE/BANDAGES/DRESSINGS) ×2 IMPLANT
DRAPE C ARM PK CFD 31 SPINE (DRAPES) ×1 IMPLANT
DRAPE LAPAROTOMY 100X77 ABD (DRAPES) ×1 IMPLANT
DRSG OPSITE POSTOP 4X6 (GAUZE/BANDAGES/DRESSINGS) IMPLANT
DRSG OPSITE POSTOP 4X8 (GAUZE/BANDAGES/DRESSINGS) IMPLANT
ELECTRODE REM PT RTRN 9FT ADLT (ELECTROSURGICAL) ×1 IMPLANT
FEE INTRAOP CADWELL SUPPLY NCS (MISCELLANEOUS) ×1 IMPLANT
FEE INTRAOP MONITOR IMPULS NCS (MISCELLANEOUS) ×1 IMPLANT
GLOVE BIOGEL PI IND STRL 6.5 (GLOVE) ×1 IMPLANT
GLOVE SURG SYN 6.5 ES PF (GLOVE) ×1 IMPLANT
GLOVE SURG SYN 6.5 PF PI (GLOVE) ×1 IMPLANT
GLOVE SURG SYN 8.5 E (GLOVE) ×3 IMPLANT
GLOVE SURG SYN 8.5 PF PI (GLOVE) ×3 IMPLANT
GOWN SRG LRG LVL 4 IMPRV REINF (GOWNS) ×1 IMPLANT
GOWN SRG XL LVL 3 NONREINFORCE (GOWNS) ×1 IMPLANT
KIT SPINAL PRONEVIEW (KITS) ×1 IMPLANT
LAVAGE JET IRRISEPT WOUND (IRRIGATION / IRRIGATOR) ×1 IMPLANT
MANIFOLD NEPTUNE II (INSTRUMENTS) ×1 IMPLANT
MARKER SKIN DUAL TIP RULER LAB (MISCELLANEOUS) ×1 IMPLANT
NDL SAFETY ECLIPSE 18X1.5 (NEEDLE) ×1 IMPLANT
NEUROSTIM INCEPTIV (Neuro Prosthesis/Implant) IMPLANT
NS IRRIG 500ML POUR BTL (IV SOLUTION) ×1 IMPLANT
PACK LAMINECTOMY ARMC (PACKS) ×1 IMPLANT
PROGRAMMER AND COMM CASE (NEUROSURGERY SUPPLIES) IMPLANT
RECHARGER SYSTEM (NEUROSURGERY SUPPLIES) IMPLANT
STAPLER SKIN PROX 35W (STAPLE) ×1 IMPLANT
STIMULATOR CORD SURESCAN MRI (Stimulator) IMPLANT
SURGIFLO W/THROMBIN 8M KIT (HEMOSTASIS) ×1 IMPLANT
SUT SILK 2 0SH CR/8 30 (SUTURE) ×1 IMPLANT
SUT STRATA 3-0 15 PS-2 (SUTURE) ×2 IMPLANT
SUT VIC AB 0 CT1 18XCR BRD 8 (SUTURE) ×1 IMPLANT
SUT VIC AB 2-0 CT1 18 (SUTURE) ×1 IMPLANT
SYR 10ML LL (SYRINGE) IMPLANT
SYR 30ML LL (SYRINGE) ×2 IMPLANT
TRAP FLUID SMOKE EVACUATOR (MISCELLANEOUS) ×1 IMPLANT

## 2024-02-17 NOTE — Transfer of Care (Signed)
 Immediate Anesthesia Transfer of Care Note  Patient: Kathleen Carr  Procedure(s) Performed: THORACIC LAMINECTOMY FOR SPINAL CORD STIMULATOR (Thoracic)  Patient Location: PACU  Anesthesia Type:General  Level of Consciousness: awake and drowsy  Airway & Oxygen Therapy: Patient Spontanous Breathing and Patient connected to face mask oxygen  Post-op Assessment: Report given to RN and Post -op Vital signs reviewed and stable  Post vital signs: Reviewed and stable  Last Vitals:  Vitals Value Taken Time  BP 125/66 02/17/24 1445  Temp 36.1 C 02/17/24 1441  Pulse 80 02/17/24 1447  Resp 15 02/17/24 1447  SpO2 100 % 02/17/24 1447  Vitals shown include unfiled device data.  Last Pain:  Vitals:   02/17/24 1441  TempSrc:   PainSc: 0-No pain         Complications: No notable events documented.

## 2024-02-17 NOTE — Anesthesia Postprocedure Evaluation (Signed)
 Anesthesia Post Note  Patient: Kathleen Carr  Procedure(s) Performed: THORACIC LAMINECTOMY FOR SPINAL CORD STIMULATOR (Thoracic)  Patient location during evaluation: PACU Anesthesia Type: General Level of consciousness: awake and alert, oriented and patient cooperative Pain management: pain level controlled Vital Signs Assessment: post-procedure vital signs reviewed and stable Respiratory status: spontaneous breathing, nonlabored ventilation and respiratory function stable Cardiovascular status: blood pressure returned to baseline and stable Postop Assessment: adequate PO intake Anesthetic complications: no   No notable events documented.   Last Vitals:  Vitals:   02/17/24 1530 02/17/24 1548  BP: 121/65 132/70  Pulse: 64 69  Resp: 14 16  Temp:  (!) 36.2 C  SpO2: 100% 99%    Last Pain:  Vitals:   02/17/24 1548  TempSrc: Temporal  PainSc: 6                  Henslee Lottman

## 2024-02-17 NOTE — Anesthesia Procedure Notes (Signed)
 Procedure Name: Intubation Date/Time: 02/17/2024 12:42 PM  Performed by: Juanda Noon, CRNAPre-anesthesia Checklist: Patient identified, Patient being monitored, Timeout performed, Emergency Drugs available and Suction available Patient Re-evaluated:Patient Re-evaluated prior to induction Oxygen Delivery Method: Circle system utilized Preoxygenation: Pre-oxygenation with 100% oxygen Induction Type: IV induction Ventilation: Mask ventilation without difficulty Laryngoscope Size: Mac, 3 and McGrath Grade View: Grade I Tube type: Oral Tube size: 7.0 mm Number of attempts: 1 Airway Equipment and Method: Stylet Placement Confirmation: ETT inserted through vocal cords under direct vision, positive ETCO2 and breath sounds checked- equal and bilateral Secured at: 22 cm Tube secured with: Tape Dental Injury: Teeth and Oropharynx as per pre-operative assessment

## 2024-02-17 NOTE — Interval H&P Note (Signed)
 History and Physical Interval Note:  02/17/2024 11:59 AM  Kathleen Carr  has presented today for surgery, with the diagnosis of G89.4 Chronic pain syndrome M96.1 Postlaminectomy syndrome, lumbar region.  The various methods of treatment have been discussed with the patient and family. After consideration of risks, benefits and other options for treatment, the patient has consented to  Procedure(s) with comments: THORACIC LAMINECTOMY FOR SPINAL CORD STIMULATOR (N/A) - THORACIC LAMINRCTOMY FOR SPINAL CORD STIMULATOR PLACEMENT as a surgical intervention.  The patient's history has been reviewed, patient examined, no change in status, stable for surgery.  I have reviewed the patient's chart and labs.  Questions were answered to the patient's satisfaction.    Heart sounds normal no MRG. Chest Clear to Auscultation Bilaterally.    Sharan Mcenaney

## 2024-02-17 NOTE — Anesthesia Preprocedure Evaluation (Addendum)
 Anesthesia Evaluation  Patient identified by MRN, date of birth, ID band Patient awake    Reviewed: Allergy & Precautions, NPO status , Patient's Chart, lab work & pertinent test results  History of Anesthesia Complications Negative for: history of anesthetic complications  Airway Mallampati: I   Neck ROM: Full    Dental  (+) Partial Lower, Missing, Loose   Pulmonary asthma , former smoker (quit 01/2023)   Pulmonary exam normal breath sounds clear to auscultation       Cardiovascular hypertension, Normal cardiovascular exam Rhythm:Regular Rate:Normal  ECG 02/09/24: normal   Neuro/Psych  PSYCHIATRIC DISORDERS (PTSD) Anxiety Depression Bipolar Disorder   Chronic back pain    GI/Hepatic ,GERD  ,,  Endo/Other  diabetes, Type 2Hypothyroidism    Renal/GU negative Renal ROS     Musculoskeletal   Abdominal   Peds  Hematology negative hematology ROS (+)   Anesthesia Other Findings   Reproductive/Obstetrics                             Anesthesia Physical Anesthesia Plan  ASA: 2  Anesthesia Plan: General   Post-op Pain Management:    Induction: Intravenous  PONV Risk Score and Plan: 3 and Ondansetron , Dexamethasone  and Treatment may vary due to age or medical condition  Airway Management Planned: Oral ETT  Additional Equipment:   Intra-op Plan:   Post-operative Plan: Extubation in OR  Informed Consent: I have reviewed the patients History and Physical, chart, labs and discussed the procedure including the risks, benefits and alternatives for the proposed anesthesia with the patient or authorized representative who has indicated his/her understanding and acceptance.     Dental advisory given  Plan Discussed with: CRNA  Anesthesia Plan Comments: (Patient consented for risks of anesthesia including but not limited to:  - adverse reactions to medications - damage to eyes, teeth, lips  or other oral mucosa - nerve damage due to positioning  - sore throat or hoarseness - damage to heart, brain, nerves, lungs, other parts of body or loss of life  Informed patient about role of CRNA in peri- and intra-operative care.  Patient voiced understanding.)        Anesthesia Quick Evaluation

## 2024-02-17 NOTE — Discharge Summary (Signed)
 Discharge Summary  Patient ID: Kathleen Carr MRN: [id number] DOB/AGE: 07-18-1971 53 y.o.  Admit date: 02/17/2024 Discharge date: 02/17/2024  Admission Diagnoses: G89.4 Chronic pain syndrome, M96.1 Postlaminectomy syndrome, lumbar region  Discharge Diagnoses:  Active Problems:   Chronic pain syndrome   Postlaminectomy syndrome of lumbar region   Discharged Condition: good  Hospital Course:  Kathleen Carr is a 53 y.o with chronic pain s/p SCS placement. Her intraoperative course was uncomplicated. She was monitored in PACU and discharged home after ambulating, urinating, and tolerating PO intake. She was given prescriptions of pain medication, muscle relaxer, and stool softener  Consults: None  Significant Diagnostic Studies: NA  Treatments: surgery: as above. Please see separately dictated operative report for further details   Discharge Exam: Blood pressure 132/70, pulse 69, temperature (!) 97.1 F (36.2 C), temperature source Temporal, resp. rate 16, height 5\' 3"  (1.6 m), weight 71.7 kg, SpO2 99%. CN grossly intact MAEW Incision c.d.I with post-op dressings in place  Disposition: Discharge disposition: 01-Home or Self Care        Allergies as of 02/17/2024       Reactions   Dust Mite Mixed Allergen Ext [mite (d. Farinae)]    Respiratory distresss   Nsaids    Acute renal failure   Other Hives   Allergy to Hickory, walnut and Birch trees and all grasses and allergic to Rabbits- patient reports anaphylactic    Sulfa Antibiotics Hives   Latex Itching   Misc. Sulfonamide Containing Compounds Hives   Wound Dressing Adhesive Rash        Medication List     STOP taking these medications    oxyCODONE  5 MG immediate release tablet Commonly known as: Oxy IR/ROXICODONE        TAKE these medications    albuterol  108 (90 Base) MCG/ACT inhaler Commonly known as: VENTOLIN  HFA Inhale 1-2 puffs into the lungs every 6 (six) hours as needed for wheezing or shortness of  breath.   ALPRAZolam  0.25 MG tablet Commonly known as: XANAX  Take 1 tablet (0.25 mg total) by mouth at bedtime as needed for anxiety.   atorvastatin  20 MG tablet Commonly known as: LIPITOR Take 1 tablet (20 mg total) by mouth daily.   Colace 100 MG capsule Generic drug: docusate sodium  Take 100 mg by mouth daily.   cyanocobalamin  1000 MCG/ML injection Commonly known as: VITAMIN B12 Inject 1 mL (1,000 mcg total) into the muscle every 30 (thirty) days.   levothyroxine  50 MCG tablet Commonly known as: SYNTHROID  Take 1 tablet (50 mcg total) by mouth daily before breakfast.   loratadine  10 MG tablet Commonly known as: CLARITIN  Take 10 mg by mouth daily as needed for allergies.   Magnesium  Glycinate 120 MG Caps Take 120 mg by mouth at bedtime.   metFORMIN  1000 MG tablet Commonly known as: GLUCOPHAGE  TAKE 1 TABLET BY MOUTH TWICE A DAY WITH FOOD   methocarbamol  500 MG tablet Commonly known as: ROBAXIN  Take 1 tablet (500 mg total) by mouth every 6 (six) hours as needed for muscle spasms. What changed:  when to take this reasons to take this   ondansetron  4 MG tablet Commonly known as: ZOFRAN  Take 1 tablet (4 mg total) by mouth every 6 (six) hours as needed for nausea.   oxybutynin  15 MG 24 hr tablet Commonly known as: DITROPAN  XL TAKE 1 TABLET BY MOUTH EVERYDAY AT BEDTIME   oxyCODONE -acetaminophen  5-325 MG tablet Commonly known as: Percocet Take 1 tablet by mouth every 4 (four) hours as  needed for up to 5 days.   pantoprazole  40 MG tablet Commonly known as: PROTONIX  Take 1 tablet (40 mg total) by mouth daily.   QUEtiapine  300 MG tablet Commonly known as: SEROQUEL  TAKE 1 TABLET BY MOUTH EVERYDAY AT BEDTIME   rOPINIRole  0.25 MG tablet Commonly known as: REQUIP  TAKE 1 TABLET BY MOUTH THREE TIMES A DAY   senna 8.6 MG Tabs tablet Commonly known as: SENOKOT Take 1 tablet (8.6 mg total) by mouth 2 (two) times daily as needed for mild constipation.   sertraline  100  MG tablet Commonly known as: ZOLOFT  Take 1 tablet (100 mg total) by mouth daily.   SYRINGE 3CC/25GX1" 25G X 1" 3 ML Misc 1 Syringe by Does not apply route every 30 (thirty) days.         Signed: Noble Bateman 02/17/2024, 7:23 PM

## 2024-02-17 NOTE — Op Note (Signed)
 Indications: the patient is a 53 yo female who was diagnosed with G89.4 Chronic pain syndrome, M96.1 Postlaminectomy syndrome, lumbar region . The patient had a successful trial for spinal cord stimulation, so was consented for placement of a permanent device   Findings: successful placement of a [employer] spinal cord stimulator.   Preoperative Diagnosis: G89.4 Chronic pain syndrome, M96.1 Postlaminectomy syndrome, lumbar region  Postoperative Diagnosis: same     EBL: 50 ml IVF: see anesthesia record Drains: none Disposition: Extubated and Stable to PACU Complications: none   No foley catheter was placed.     Preoperative Note:    Risks of surgery discussed in clinic.   Operative Note:      The patient was then brought from the preoperative center with intravenous access established.  The patient underwent general anesthesia and endotracheal tube intubation, then was rotated on the [facility] table where all pressure points were appropriately padded.  An incision was marked with flouroscopy at T9/10, and on the left flank. The skin was then thoroughly cleansed.  Perioperative antibiotic prophylaxis was administered.  Sterile prep and drapes were then applied and a timeout was then observed.     Once this was complete an incision was opened with the use of a #10 blade knife in the midline at the thoracic incision.  The paraspinus muscled were subperiosteally dissected until the laminae of T8 and T9 were visualized. Flouroscopy was used to confirm the level. A self-retaining retractor was placed.   The rongeur was used to remove the spinous process of T9 and T8 (inferior half).  The drill was used to thin the bone until the ligamentum flavum was visualized.  The ligamentum was then removed and the dura visualized. This was widened until placement of the paddle lead was possible.     The lead was then advanced to the top of T8 with care to stay inferior to T7 due to the disc herniation present.   The lead was secured with a 2-0 silk suture. Monitoring was checked and was stable.    The incision on the flank was then opened and a pocket formed until it was large enough for the pulse generator.  The tunneler was used to connect between the pocket and the incision.  The lead was inserted into the tunneler and tunneled to the flank.  The leads were attached to the IPG and impedances checked.  The leads were then tightened.  The IPG was then inserted into the pouch.   Both sites were irrigated.  The wounds were closed in layers with 0 and 2-0 vicryl.  The skin was approximated with monocryl. A sterile dressing was applied.   Monitoring was stable throughout.   Patient was then rotated back to the preoperative bed awakened from anesthesia and taken to recovery. All counts are correct in this case.   I performed the entire procedure with the assistance of Anastacio Karvonen PA as an Designer, television/film set. An assistant was required for this procedure due to the complexity.  The assistant provided assistance in tissue manipulation and suction, and was required for the successful and safe performance of the procedure. I performed the critical portions of the procedure.      Jodeen Munch MD

## 2024-02-17 NOTE — Discharge Instructions (Addendum)
 NEUROSURGERY DISCHARGE INSTRUCTIONS  Admission diagnosis: Chronic pain syndrome [G89.4] Postlaminectomy syndrome, lumbar region [M96.1]  Operative procedure: Spinal cord stimulator  What to do after you leave the hospital:  Recommended diet: regular diet. Increase protein intake to promote wound healing.  Recommended activity: no lifting, driving, or strenuous exercise for 2 weeks . ou should walk multiple times per day  Special Instructions  No straining, no heavy lifting > 10lbs x 4 weeks.  Keep incision area clean and dry. May shower in 2 days. No baths or pools for 6 weeks.  Please remove dressing tomorrow, no need to apply a bandage afterwards  You have no sutures to remove, the skin is closed with adhesive  Please take pain medications as directed. Take a stool softener if on pain medications  *Regarding compression stockings-  Please wear day and night until you are walking a couple hundred feet three times a day.   Please Report any of the following: Nausea or Vomiting, Temperature is greater than 101.67F (38.1C) degrees, Dizziness, Abdominal Pain, Difficulty Breathing or Shortness of Breath, Inability to Eat, drink Fluids, or Take medications, Bleeding, swelling, or drainage from surgical incision sites, New numbness or weakness, and Bowel or bladder dysfunction to the neurosurgeon on call. How to contact us :  If you have any questions/concerns before or after surgery, you can reach us  at [phone], or you can send a mychart message. We can be reached by phone or mychart 8am-4pm, Monday-Friday.  *Please note: Calls after 4pm are forwarded to a third party answering service. Mychart messages are not routinely monitored during evenings, weekends, and holidays. Please call our office to contact the answering service for urgent concerns during non-business hours.   Additional Follow up appointments Please follow up with Ludwig Safer PA-C as scheduled in 2-3 weeks   Please see  below for scheduled appointments:  Future Appointments  Date Time Provider Department Center  02/26/2024 11:00 AM Ludwig Safer, PA-C [facility] None  03/29/2024  1:45 PM Jodeen Munch, MD [facility] None  08/03/2024  1:30 PM Thersia Flax, MD [facility]

## 2024-02-18 ENCOUNTER — Encounter: Payer: Self-pay | Admitting: Neurosurgery

## 2024-02-19 ENCOUNTER — Encounter: Payer: Self-pay | Admitting: Neurosurgery

## 2024-02-19 NOTE — Telephone Encounter (Signed)
 Per Dr. Mont Antis, patient is not at risk for infection due to how soon the surgery was. He is not concerned about the dark redness at the incision site.

## 2024-02-22 ENCOUNTER — Ambulatory Visit: Admitting: Internal Medicine

## 2024-02-26 ENCOUNTER — Encounter: Payer: Self-pay | Admitting: Physician Assistant

## 2024-02-26 ENCOUNTER — Ambulatory Visit (INDEPENDENT_AMBULATORY_CARE_PROVIDER_SITE_OTHER): Admitting: Physician Assistant

## 2024-02-26 VITALS — BP 120/78 | Temp 98.2°F | Ht 63.0 in | Wt 158.0 lb

## 2024-02-26 DIAGNOSIS — Z4542 Encounter for adjustment and management of neuropacemaker (brain) (peripheral nerve) (spinal cord): Secondary | ICD-10-CM

## 2024-02-26 DIAGNOSIS — G894 Chronic pain syndrome: Secondary | ICD-10-CM

## 2024-02-26 DIAGNOSIS — Z09 Encounter for follow-up examination after completed treatment for conditions other than malignant neoplasm: Secondary | ICD-10-CM

## 2024-02-26 NOTE — Progress Notes (Signed)
   REFERRING PHYSICIAN:  [place] [address] [place],  [place] [address]  DOS: 02/17/24, thoracic laminectomy for spinal cord stimulator  HISTORY OF PRESENT ILLNESS: Kathleen Carr is approximately 1 week status post thoracic laminectomy for spinal cord stimulator placement. she is doing well.  She has some incisional pain and pain that radiates to bilateral ribs.  She is no longer taking oxycodone  and using Robaxin  as needed.  No other complaints at this time.   PHYSICAL EXAMINATION:  General: Patient is well developed, well nourished, calm, collected, and in no apparent distress.   NEUROLOGICAL:  General: In no acute distress.   Awake, alert, oriented to person, place, and time.  Pupils equal round and reactive to light.  Facial tone is symmetric.     Strength:            Side Iliopsoas Quads Hamstring PF DF EHL  R 4 5 5 5 5 5   L 5 5 5 5 5 5    Incisions c/d/I.  No erythema or drainage.   ROS (Neurologic):  Negative except as noted above  IMAGING: No new imaging completed prior to this appointment  ASSESSMENT/PLAN:  Kathleen Carr is doing well after undergoing thoracic laminectomy for spinal cord stimulator on 02/17/2024.She has some incisional pain and pain that radiates to bilateral ribs which is not uncommon considering location of surgery.  She is no longer taking oxycodone  and using Robaxin  as needed.  No other complaints at this time.  Stimulator rep is present in the office to optimize stimulator today.  Patient plans to follow-up in approximately 1 month with Dr. Jeris Montes.I have advised the patient to lift up to 10 pounds until 6 weeks after surgery, then increase up to 25 pounds until 12 weeks after surgery.  After 12 weeks post-op, the patient advised to increase activity as tolerated.  Advised to contact the office if any questions or concerns arise.  Ludwig Safer PA-C Department of neurosurgery

## 2024-03-15 DIAGNOSIS — Z79891 Long term (current) use of opiate analgesic: Secondary | ICD-10-CM | POA: Diagnosis not present

## 2024-03-15 DIAGNOSIS — F1120 Opioid dependence, uncomplicated: Secondary | ICD-10-CM | POA: Diagnosis not present

## 2024-03-16 DIAGNOSIS — F1120 Opioid dependence, uncomplicated: Secondary | ICD-10-CM | POA: Diagnosis not present

## 2024-03-16 DIAGNOSIS — M961 Postlaminectomy syndrome, not elsewhere classified: Secondary | ICD-10-CM | POA: Diagnosis not present

## 2024-03-16 DIAGNOSIS — F4312 Post-traumatic stress disorder, chronic: Secondary | ICD-10-CM | POA: Diagnosis not present

## 2024-03-16 DIAGNOSIS — M542 Cervicalgia: Secondary | ICD-10-CM | POA: Diagnosis not present

## 2024-03-16 DIAGNOSIS — F3189 Other bipolar disorder: Secondary | ICD-10-CM | POA: Diagnosis not present

## 2024-03-16 DIAGNOSIS — E119 Type 2 diabetes mellitus without complications: Secondary | ICD-10-CM | POA: Diagnosis not present

## 2024-03-16 DIAGNOSIS — Z716 Tobacco abuse counseling: Secondary | ICD-10-CM | POA: Diagnosis not present

## 2024-03-16 DIAGNOSIS — Z720 Tobacco use: Secondary | ICD-10-CM | POA: Diagnosis not present

## 2024-03-16 DIAGNOSIS — G894 Chronic pain syndrome: Secondary | ICD-10-CM | POA: Diagnosis not present

## 2024-03-16 DIAGNOSIS — M1991 Primary osteoarthritis, unspecified site: Secondary | ICD-10-CM | POA: Diagnosis not present

## 2024-03-16 DIAGNOSIS — I10 Essential (primary) hypertension: Secondary | ICD-10-CM | POA: Diagnosis not present

## 2024-03-16 DIAGNOSIS — M545 Low back pain, unspecified: Secondary | ICD-10-CM | POA: Diagnosis not present

## 2024-03-19 ENCOUNTER — Other Ambulatory Visit: Payer: Self-pay | Admitting: Internal Medicine

## 2024-03-19 DIAGNOSIS — F319 Bipolar disorder, unspecified: Secondary | ICD-10-CM

## 2024-03-19 DIAGNOSIS — G4700 Insomnia, unspecified: Secondary | ICD-10-CM

## 2024-03-22 NOTE — Telephone Encounter (Signed)
 Rx was sent in for a 90 day with refills 4/25 with the dx code. Please refuse refill request.

## 2024-03-29 ENCOUNTER — Ambulatory Visit (INDEPENDENT_AMBULATORY_CARE_PROVIDER_SITE_OTHER): Admitting: Neurosurgery

## 2024-03-29 ENCOUNTER — Encounter: Payer: Self-pay | Admitting: Neurosurgery

## 2024-03-29 VITALS — BP 110/72 | Ht 63.0 in | Wt 158.0 lb

## 2024-03-29 DIAGNOSIS — M961 Postlaminectomy syndrome, not elsewhere classified: Secondary | ICD-10-CM

## 2024-03-29 NOTE — Progress Notes (Signed)
   REFERRING PHYSICIAN:  Thersia Flax, Md [address] [place],  [place] [address]  DOS: 02/17/24, thoracic laminectomy for spinal cord stimulator  HISTORY OF PRESENT ILLNESS: Kathleen Carr is status post thoracic laminectomy for spinal cord stimulator placement.  She is off all narcotics.  She is doing very well.  She still has some pain in between her shoulder blades.  PHYSICAL EXAMINATION:  General: Patient is well developed, well nourished, calm, collected, and in no apparent distress.   NEUROLOGICAL:  General: In no acute distress.   Awake, alert, oriented to person, place, and time.  Pupils equal round and reactive to light.  Facial tone is symmetric.     Strength:            Side Iliopsoas Quads Hamstring PF DF EHL  R 5 5 5 5 5 5   L 5 5 5 5 5 5    Incisions c/d/I.   Better back incision has a small area of scabbing.  No evidence of infection or drainage.   ROS (Neurologic):  Negative except as noted above  IMAGING: No new imaging completed prior to this appointment  ASSESSMENT/PLAN:  Kathleen Carr is doing well after undergoing thoracic laminectomy for spinal cord stimulator on 02/17/2024.  Her thoracic pain should improve with time.  Overall, she is much better and very happy with her improvements.  She is off all narcotics.  We reviewed her activity limitations.  Jodeen Munch MD Department of neurosurgery

## 2024-03-30 ENCOUNTER — Encounter: Payer: Self-pay | Admitting: Internal Medicine

## 2024-04-02 ENCOUNTER — Other Ambulatory Visit: Payer: Self-pay | Admitting: Internal Medicine

## 2024-04-02 ENCOUNTER — Other Ambulatory Visit: Payer: Self-pay | Admitting: Neurosurgery

## 2024-04-02 MED ORDER — TIRZEPATIDE 2.5 MG/0.5ML ~~LOC~~ SOAJ: 2.5000 mg | SUBCUTANEOUS | 2 refills | Status: DC

## 2024-04-26 ENCOUNTER — Other Ambulatory Visit: Payer: Self-pay | Admitting: Internal Medicine

## 2024-05-23 ENCOUNTER — Other Ambulatory Visit: Payer: Self-pay | Admitting: Internal Medicine

## 2024-05-26 ENCOUNTER — Encounter: Payer: Self-pay | Admitting: Internal Medicine

## 2024-05-27 NOTE — Telephone Encounter (Signed)
 Pt requesting renewal of handicap plaque

## 2024-05-28 ENCOUNTER — Other Ambulatory Visit: Payer: Self-pay | Admitting: Internal Medicine

## 2024-05-31 NOTE — Telephone Encounter (Signed)
Completed and placed in quick sign folder for signature.

## 2024-06-02 ENCOUNTER — Other Ambulatory Visit: Payer: Self-pay | Admitting: Oncology

## 2024-06-15 DIAGNOSIS — M542 Cervicalgia: Secondary | ICD-10-CM | POA: Diagnosis not present

## 2024-06-15 DIAGNOSIS — G4709 Other insomnia: Secondary | ICD-10-CM | POA: Diagnosis not present

## 2024-06-15 DIAGNOSIS — M1991 Primary osteoarthritis, unspecified site: Secondary | ICD-10-CM | POA: Diagnosis not present

## 2024-06-15 DIAGNOSIS — M961 Postlaminectomy syndrome, not elsewhere classified: Secondary | ICD-10-CM | POA: Diagnosis not present

## 2024-06-15 DIAGNOSIS — E119 Type 2 diabetes mellitus without complications: Secondary | ICD-10-CM | POA: Diagnosis not present

## 2024-06-15 DIAGNOSIS — F1120 Opioid dependence, uncomplicated: Secondary | ICD-10-CM | POA: Diagnosis not present

## 2024-06-15 DIAGNOSIS — M545 Low back pain, unspecified: Secondary | ICD-10-CM | POA: Diagnosis not present

## 2024-06-15 DIAGNOSIS — G894 Chronic pain syndrome: Secondary | ICD-10-CM | POA: Diagnosis not present

## 2024-06-15 DIAGNOSIS — F3189 Other bipolar disorder: Secondary | ICD-10-CM | POA: Diagnosis not present

## 2024-06-15 DIAGNOSIS — Z720 Tobacco use: Secondary | ICD-10-CM | POA: Diagnosis not present

## 2024-06-15 DIAGNOSIS — F4312 Post-traumatic stress disorder, chronic: Secondary | ICD-10-CM | POA: Diagnosis not present

## 2024-06-15 DIAGNOSIS — I10 Essential (primary) hypertension: Secondary | ICD-10-CM | POA: Diagnosis not present

## 2024-06-16 ENCOUNTER — Other Ambulatory Visit: Payer: Self-pay | Admitting: Neurosurgery

## 2024-06-20 ENCOUNTER — Other Ambulatory Visit: Payer: Self-pay | Admitting: Internal Medicine

## 2024-06-26 ENCOUNTER — Encounter: Payer: Self-pay | Admitting: Internal Medicine

## 2024-06-28 MED ORDER — TIRZEPATIDE 5 MG/0.5ML ~~LOC~~ SOAJ: 5.0000 mg | SUBCUTANEOUS | 2 refills | Status: AC

## 2024-07-07 ENCOUNTER — Ambulatory Visit: Admitting: Neurosurgery

## 2024-07-07 ENCOUNTER — Encounter: Payer: Self-pay | Admitting: Neurosurgery

## 2024-07-07 VITALS — BP 102/64 | Ht 64.0 in | Wt 148.0 lb

## 2024-07-07 DIAGNOSIS — R200 Anesthesia of skin: Secondary | ICD-10-CM

## 2024-07-07 DIAGNOSIS — M4714 Other spondylosis with myelopathy, thoracic region: Secondary | ICD-10-CM

## 2024-07-07 DIAGNOSIS — Z9682 Presence of neurostimulator: Secondary | ICD-10-CM | POA: Diagnosis not present

## 2024-07-07 DIAGNOSIS — W182XXA Fall in (into) shower or empty bathtub, initial encounter: Secondary | ICD-10-CM | POA: Diagnosis not present

## 2024-07-07 DIAGNOSIS — R29898 Other symptoms and signs involving the musculoskeletal system: Secondary | ICD-10-CM

## 2024-07-07 NOTE — Progress Notes (Signed)
   REFERRING PHYSICIAN:  Marylynn Verneita CROME, Md [address] [place],  [place] [address]  DOS: 02/17/24, thoracic laminectomy for spinal cord stimulator  HISTORY OF PRESENT ILLNESS: Rotunda D. Ashraf is status post thoracic laminectomy for spinal cord stimulator placement.  She had a fall approximately a month ago when she slipped in her bathtub.  Since that time, she has had some weakness and numbness in her legs.  She is having some difficulty walking though she has not had any recent trips or falls.   PHYSICAL EXAMINATION:  General: Patient is well developed, well nourished, calm, collected, and in no apparent distress.   NEUROLOGICAL:  General: In no acute distress.   Awake, alert, oriented to person, place, and time.  Pupils equal round and reactive to light.  Facial tone is symmetric.     Strength:            Side Iliopsoas Quads Hamstring PF DF EHL  R 5 5 5 5 5 5   L 5 5 5 5 5 5    Incisions c/d/I.     ROS (Neurologic):  Negative except as noted above  IMAGING: No new imaging completed prior to this appointment  ASSESSMENT/PLAN:  Megha D. Glasner is doing well after undergoing thoracic laminectomy for spinal cord stimulator on 02/17/2024.  Unfortunately, she has developed a new area of concern in her upper back.  On review of her prior imaging, she does have a disc herniation in that area.  I am concerned that she may have worsened his disc herniation.  She has some symptoms of thoracic myelopathy.  I will repeat her thoracic MRI scan and make further decisions from there.    Reeves Daisy MD Department of neurosurgery

## 2024-07-10 ENCOUNTER — Ambulatory Visit
Admission: RE | Admit: 2024-07-10 | Discharge: 2024-07-10 | Disposition: A | Discharge status: Home / Self Care | Source: Ambulatory Visit | Attending: Neurosurgery | Admitting: Neurosurgery

## 2024-07-10 DIAGNOSIS — M4714 Other spondylosis with myelopathy, thoracic region: Secondary | ICD-10-CM | POA: Insufficient documentation

## 2024-07-10 DIAGNOSIS — M549 Dorsalgia, unspecified: Secondary | ICD-10-CM | POA: Diagnosis not present

## 2024-07-10 DIAGNOSIS — R29898 Other symptoms and signs involving the musculoskeletal system: Secondary | ICD-10-CM | POA: Diagnosis not present

## 2024-07-10 DIAGNOSIS — M5124 Other intervertebral disc displacement, thoracic region: Secondary | ICD-10-CM | POA: Diagnosis not present

## 2024-07-10 DIAGNOSIS — M4804 Spinal stenosis, thoracic region: Secondary | ICD-10-CM | POA: Diagnosis not present

## 2024-07-18 ENCOUNTER — Telehealth: Payer: Self-pay | Admitting: Neurosurgery

## 2024-07-18 NOTE — Telephone Encounter (Signed)
 Patient is calling to find out what the next step is in her treatment plan that her MRI has resulted. Please advise.

## 2024-07-18 NOTE — Telephone Encounter (Signed)
 Appointment made

## 2024-07-19 ENCOUNTER — Other Ambulatory Visit: Payer: Self-pay

## 2024-07-19 ENCOUNTER — Telehealth: Payer: Self-pay

## 2024-07-19 ENCOUNTER — Encounter: Payer: Self-pay | Admitting: Neurosurgery

## 2024-07-19 ENCOUNTER — Ambulatory Visit (INDEPENDENT_AMBULATORY_CARE_PROVIDER_SITE_OTHER): Admitting: Neurosurgery

## 2024-07-19 VITALS — BP 104/62 | Ht 64.0 in | Wt 148.0 lb

## 2024-07-19 DIAGNOSIS — Z01818 Encounter for other preprocedural examination: Secondary | ICD-10-CM

## 2024-07-19 DIAGNOSIS — Z9682 Presence of neurostimulator: Secondary | ICD-10-CM

## 2024-07-19 DIAGNOSIS — M5104 Intervertebral disc disorders with myelopathy, thoracic region: Secondary | ICD-10-CM

## 2024-07-19 DIAGNOSIS — M5124 Other intervertebral disc displacement, thoracic region: Secondary | ICD-10-CM

## 2024-07-19 DIAGNOSIS — M4714 Other spondylosis with myelopathy, thoracic region: Secondary | ICD-10-CM

## 2024-07-19 DIAGNOSIS — W19XXXA Unspecified fall, initial encounter: Secondary | ICD-10-CM | POA: Diagnosis not present

## 2024-07-19 DIAGNOSIS — M4804 Spinal stenosis, thoracic region: Secondary | ICD-10-CM | POA: Diagnosis not present

## 2024-07-19 NOTE — Telephone Encounter (Signed)
 We received a surgical clearance for patient from [facility] via fax.  I sent a copy to Dr. Verneita Osmond folder on the S drive and hand-delivered a copy to

## 2024-07-19 NOTE — Telephone Encounter (Signed)
 Surgical clearance form has been placed in red folder. Pt is scheduled for a T7-8 transpedicular discectomy, T7-8 posterior spinal fusion/general anesthesia on 07/22/2024.  They have instructed pt to hold metformin  2 days prior to surgery and to also hold mounjaro  7 days prior to surgery.   Pt's last appt with you was 02/02/2024.

## 2024-07-19 NOTE — Telephone Encounter (Signed)
 Faxed

## 2024-07-19 NOTE — Patient Instructions (Signed)
 Please see below for information in regards to your upcoming surgery:   Planned surgery: T7-8 transpedicular discectomy, T7-8 posterior spinal fusion   Surgery date: 07/22/24 at [facility] (Medical Mall: [address] [place], [place] [address]) - you will find out your arrival time the business day before your surgery.   Pre-op appointment at [facility] Pre-admit Testing: you will receive a call with a date/time for this appointment. If you are scheduled for an in person appointment, Pre-admit Testing is located on the first floor of the [facility] building, [address] [facility], [address]. During this appointment, they will advise you which medications you can take the morning of surgery, and which medications you will need to hold for surgery. Labs (such as blood work, EKG) may be done at your pre-op appointment. You are not required to fast for these labs. Should you need to change your pre-op appointment, please call Pre-admit testing at [phone].     Diabetes/heart failure/kidney disease/weight loss medications that require an extended hold: Per anesthesia guidelines (due to the increased risk of aspiration caused by delayed gastric emptying):   Metformin : hold for 2 days prior to surgery Mounjaro  injections: hold for 7 days prior to surgery    Surgical clearance: we will send a clearance form to Dr Marylynn. They may wish to see you in their office prior to signing the clearance form. If so, they may call you to schedule an appointment.     NSAIDS (Non-steroidal anti-inflammatory drugs): because you are having a fusion, please avoid taking any NSAIDS (examples: ibuprofen , motrin , aleve , naproxen , meloxicam, diclofenac) for 3 months after surgery. Celebrex  is an exception and is OK to take, if prescribed. Tylenol  is not an NSAID.    Common restrictions after spine surgery: No bending, lifting, or twisting ("BLT"). Avoid lifting objects  heavier than 10 pounds for the first 6 weeks after surgery. Where possible, avoid household activities that involve lifting, bending, reaching, pushing, or pulling such as laundry, vacuuming, grocery shopping, and childcare. Try to arrange for help from friends and family for these activities while you heal. Do not drive while taking prescription pain medication. Weeks 6 through 12 after surgery: avoid lifting more than 25 pounds.    X-rays after surgery: Because you are having a fusion or arthroplasty: for appointments after your 2 week follow-up: please arrive at the [facility] outpatient imaging center ([address], [address], [employer]) or [facility] one hour prior to your appointment for x-rays. This applies to every appointment after your 2 week follow-up. Failure to do so may result in your appointment being rescheduled. *We recently started construction to have x-ray in our office. This may be completed by the time you come in for your 6 week post-op appointment. Please check with us  closer to that time to see if you can have your x-rays at our office*    How to contact us :  If you have any questions/concerns before or after surgery, you can reach us  at [phone], or you can send a mychart message. We can be reached by phone or mychart 8am-4pm, Monday-Friday.  *Please note: Calls after 4pm are forwarded to a third party answering service. Mychart messages are not routinely monitored during evenings, weekends, and holidays. Please call our office to contact the answering service for urgent concerns during non-business hours.   If you have FMLA/disability paperwork, please drop it off or fax it to [phone]   Appointments/FMLA & disability  paperwork: [employer] Registered Nurse/Surgery scheduler: Zaylan Kissoon, RN Certified Medical Assistants: Don, CMA, Elenor, CMA, & Damien, CMA Physician Assistants: Lyle Decamp, PA-C, Edsel Goods, PA-C & Glade Boys, PA-C Surgeons: Penne Sharps, MD & Reeves Daisy, MD   [facility] PREADMIT TESTING VISIT and SURGERY INFORMATION SHEET   Now that surgery has been scheduled you can anticipate several phone calls from [facility] services. A pharmacy technician will call you to verify your current list of medications taken at home.               The Pre-Service Center will call to verify your insurance information and to give you billing estimates and information.             The Preadmit Testing Office will be calling to schedule a visit to obtain information for the anesthesia team and provide instructions on preparation for surgery.  What can you expect for the Preadmit Testing Visit: Appointments may be scheduled in-person or by telephone.  If a telephone visit is scheduled, you may be asked to come into the office to have lab tests or other studies performed.   This visit will not be completed any greater than 14 days prior to your surgery.  If your surgery has been scheduled for a future date, please do not be alarmed if we have not contacted you to schedule an appointment more than a month prior to the surgery date.    Please be prepared to provide the following information during this appointment:            -Personal medical history                                               -Medication and allergy list            -Any history of problems with anesthesia              -Recent lab work or diagnostic studies            -Please notify us  of any needs we should be aware of to provide the best care possible           -You will be provided with instructions on how to prepare for your surgery.    On The Day of Surgery:  You must have a driver to take you home after surgery, you will be asked not to drive for 24 hours following surgery.  Taxi, Gisele and non-medical transport will not be acceptable means of transportation unless you have a responsible individual who will be  traveling with you.  Visitors in the surgical area:   2 people will be able to visit you in your room once your preparation for surgery has been completed. During surgery, your visitors will be asked to wait in the Surgery Waiting Area.  It is not a requirement for them to stay, if they prefer to leave and come back.  Your visitor(s) will be given an update once the surgery has been completed.  No visitors are allowed in the initial recovery room to respect patient privacy and safety.  Once you are more awake and transfer to the secondary recovery area, or are transferred to an inpatient room, visitors will again be able to see you.  To respect and protect your privacy: We will ask  on the day of surgery who your driver will be and what the contact number for that individual will be. We will ask if it is okay to share information with this individual, or if there is an alternative individual that we, or the surgeon, should contact to provide updates and information. If family or friends come to the surgical information desk requesting information about you, who you have not listed with us , no information will be given.   It may be helpful to designate someone as the main contact who will be responsible for updating your other friends and family.    PREADMIT TESTING OFFICE: [phone] SAME DAY SURGERY: [phone] We look forward to caring for you before and throughout the process of your surgery.

## 2024-07-19 NOTE — Telephone Encounter (Signed)
 error

## 2024-07-19 NOTE — Addendum Note (Signed)
 Addended by: Rylea Selway on: 07/19/2024 12:19 PM   Modules accepted: Orders

## 2024-07-19 NOTE — H&P (View-Only) (Signed)
 REFERRING PHYSICIAN:  Marylynn Verneita CROME, Md [address] [place],  [place] [address]  DOS: 02/17/24, thoracic laminectomy for spinal cord stimulator  HISTORY OF PRESENT ILLNESS: 07/19/2024 Her balance has continued to be an issue.  She is struggling with walking.  07/07/2024  Kathleen Carr is status post thoracic laminectomy for spinal cord stimulator placement.  She had a fall approximately a month ago when she slipped in her bathtub.  Since that time, she has had some weakness and numbness in her legs.  She is having some difficulty walking though she has not had any recent trips or falls.   Family History  Problem Relation Age of Onset   Hypertension Mother    Thyroid  disease Mother    Lung cancer Father 57       carcinoid, right lung   Ulcerative colitis Father    Breast cancer Paternal Aunt        x3 aunts   Glaucoma Maternal Grandmother    Renal Disease Maternal Grandfather    Diabetes Maternal Grandfather        Type2   Stroke Paternal Grandfather    Hypertension Paternal Grandfather     Social History   Socioeconomic History   Marital status: Married    Spouse name: Olam   Number of children: 0   Years of education: Not on file   Highest education level: Associate degree: occupational, Scientist, product/process development, or vocational program  Occupational History   Not on file  Tobacco Use   Smoking status: Former    Current packs/day: 0.00    Average packs/day: 0.5 packs/day for 42.0 years (21.0 ttl pk-yrs)    Types: Cigarettes    Start date: 01/25/1981    Quit date: 01/26/2023    Years since quitting: 1.4   Smokeless tobacco: Never  Vaping Use   Vaping status: Former   Substances: Nicotine   Substance and Sexual Activity   Alcohol use: Not Currently   Drug use: Not Currently   Sexual activity: Yes    Partners: Female    Comment: married to wife  Other Topics Concern   Not on file  Social History Narrative   Lives at home with wife.    Social Drivers of Manufacturing engineer Strain: Low Risk  (01/31/2024)   Overall Financial Resource Strain (CARDIA)    Difficulty of Paying Living Expenses: Not hard at all  Food Insecurity: No Food Insecurity (01/31/2024)   Hunger Vital Sign    Worried About Running Out of Food in the Last Year: Never true    Ran Out of Food in the Last Year: Never true  Transportation Needs: No Transportation Needs (01/31/2024)   PRAPARE - Administrator, Civil Service (Medical): No    Lack of Transportation (Non-Medical): No  Physical Activity: Unknown (01/31/2024)   Exercise Vital Sign    Days of Exercise per Week: 0 days    Minutes of Exercise per Session: Not on file  Stress: No Stress Concern Present (01/31/2024)   [employer] of Occupational Health - Occupational Stress Questionnaire    Feeling of Stress : Only a little  Social Connections: Moderately Isolated (01/31/2024)   Social Connection and Isolation Panel    Frequency of Communication with Friends and Family: More than three times a week    Frequency of Social Gatherings with Friends and Family: Once a week    Attends Religious Services: Never    Database administrator or  Organizations: No    Attends Engineer, structural: Not on file    Marital Status: Married   Current Meds  Medication Sig   albuterol  (VENTOLIN  HFA) 108 (90 Base) MCG/ACT inhaler Inhale 1-2 puffs into the lungs every 6 (six) hours as needed for wheezing or shortness of breath.   ALPRAZolam  (XANAX ) 0.25 MG tablet Take 1 tablet (0.25 mg total) by mouth at bedtime as needed for anxiety.   atorvastatin  (LIPITOR) 20 MG tablet TAKE 1 TABLET BY MOUTH EVERY DAY   cyanocobalamin  (VITAMIN B12) 1000 MCG/ML injection INJECT 1 ML (1,000 MCG TOTAL) INTO THE MUSCLE EVERY 30 DAYS.   docusate sodium  (COLACE) 100 MG capsule Take 100 mg by mouth daily.   levothyroxine  (SYNTHROID ) 50 MCG tablet TAKE 1 TABLET BY MOUTH DAILY BEFORE BREAKFAST   loratadine  (CLARITIN ) 10 MG tablet Take 10 mg by  mouth daily as needed for allergies.   Magnesium  Glycinate 120 MG CAPS Take 120 mg by mouth at bedtime.   metFORMIN  (GLUCOPHAGE ) 1000 MG tablet TAKE 1 TABLET BY MOUTH TWICE A DAY WITH FOOD   methocarbamol  (ROBAXIN ) 500 MG tablet TAKE 1 TABLET BY MOUTH EVERY 6 HOURS AS NEEDED FOR MUSCLE SPASMS.   ondansetron  (ZOFRAN ) 4 MG tablet Take 1 tablet (4 mg total) by mouth every 6 (six) hours as needed for nausea.   oxybutynin  (DITROPAN  XL) 15 MG 24 hr tablet TAKE 1 TABLET BY MOUTH EVERYDAY AT BEDTIME   pantoprazole  (PROTONIX ) 40 MG tablet TAKE 1 TABLET BY MOUTH EVERY DAY   QUEtiapine  (SEROQUEL ) 300 MG tablet TAKE 1 TABLET BY MOUTH EVERYDAY AT BEDTIME   rOPINIRole  (REQUIP ) 0.25 MG tablet TAKE 1 TABLET BY MOUTH THREE TIMES A DAY   sertraline  (ZOLOFT ) 100 MG tablet Take 1 tablet (100 mg total) by mouth daily.   Syringe/Needle, Disp, (SYRINGE 3CC/25GX1) 25G X 1 3 ML MISC 1 Syringe by Does not apply route every 30 (thirty) days.   tirzepatide  (MOUNJARO ) 5 MG/0.5ML Pen Inject 5 mg into the skin once a week.   Allergies  Allergen Reactions   Dust Mite Mixed Allergen Ext [Mite (D. Farinae)]     Respiratory distresss   Nsaids     Acute renal failure   Other Hives    Allergy to Hickory, walnut and Birch trees and all grasses and allergic to Rabbits- patient reports anaphylactic    Sulfa Antibiotics Hives   Latex Itching   Misc. Sulfonamide Containing Compounds Hives   Wound Dressing Adhesive Rash     PHYSICAL EXAMINATION:  General: Patient is well developed, well nourished, calm, collected, and in no apparent distress.   NEUROLOGICAL:  General: In no acute distress.   Awake, alert, oriented to person, place, and time.  Pupils equal round and reactive to light.  Facial tone is symmetric.     Strength:            Side Iliopsoas Quads Hamstring PF DF EHL  R 4 5 5 5 5 5   L 4 5 5 5 5 5    Reflexes 1+ UE, 3+ BLE  Gait wide-based  Incisions c/d/I.     ROS (Neurologic):  Negative except as  noted above  IMAGING: MRI T spine 07/10/2024 IMPRESSION: 1. Spinal cord stimulator in the canal at the level of T7 through T9. Evaluation of the cord at these levels is suboptimal secondary to artifact. 2. 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 3. Moderate canal stenosis at T4-T5 secondary to right central disc  protrusion.     Electronically Signed   By: Clem Savory M.D.   On: 07/15/2024 15:43  ASSESSMENT/PLAN:  Kathleen Carr has recently developed worsening thoracic myelopathy.  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  She now has severe stenosis at that level.  She has objective weakness and issues with balance that were not present a month ago.  She has severe stenosis at T7-8.  This is due to a worsened disc herniation in addition to her spinal cord stimulator.  Based on review of her x-rays, it is unclear whether her lead has migrated superiorly as well.    There is no role for conservative management given her ongoing neurologic issues.  I recommended urgent surgical intervention with T7-8 transpedicular discectomy with T7-8 instrumentation and fusion.  This will require repositioning of her spinal cord stimulator intraoperatively.  I have recommended that the procedure be scheduled on an urgent basis to minimize the risk of neurological decline.  She is currently on Mounjaro .  The urgency of her current neurological symptoms justify proceeding with surgical intervention without waiting the full 7 days since her last dose.  I discussed the planned procedure at length with the patient, including the risks, benefits, alternatives, and indications. The risks discussed include but are not limited to bleeding, infection, need for reoperation, spinal fluid leak, stroke, vision loss, anesthetic complication, coma, paralysis, and even death. I also described in detail that improvement was not guaranteed.  The patient expressed  understanding of these risks, and asked that we proceed with surgery. I described the surgery in layman's terms, and gave ample opportunity for questions, which were answered to the best of my ability.    Reeves Daisy MD Department of neurosurgery

## 2024-07-19 NOTE — Progress Notes (Signed)
 REFERRING PHYSICIAN:  Marylynn Verneita CROME, Md [address] [place],  [place] [address]  DOS: 02/17/24, thoracic laminectomy for spinal cord stimulator  HISTORY OF PRESENT ILLNESS: 07/19/2024 Her balance has continued to be an issue.  She is struggling with walking.  07/07/2024  Kathleen Carr is status post thoracic laminectomy for spinal cord stimulator placement.  She had a fall approximately a month ago when she slipped in her bathtub.  Since that time, she has had some weakness and numbness in her legs.  She is having some difficulty walking though she has not had any recent trips or falls.   Family History  Problem Relation Age of Onset   Hypertension Mother    Thyroid  disease Mother    Lung cancer Father 57       carcinoid, right lung   Ulcerative colitis Father    Breast cancer Paternal Aunt        x3 aunts   Glaucoma Maternal Grandmother    Renal Disease Maternal Grandfather    Diabetes Maternal Grandfather        Type2   Stroke Paternal Grandfather    Hypertension Paternal Grandfather     Social History   Socioeconomic History   Marital status: Married    Spouse name: Olam   Number of children: 0   Years of education: Not on file   Highest education level: Associate degree: occupational, Scientist, product/process development, or vocational program  Occupational History   Not on file  Tobacco Use   Smoking status: Former    Current packs/day: 0.00    Average packs/day: 0.5 packs/day for 42.0 years (21.0 ttl pk-yrs)    Types: Cigarettes    Start date: 01/25/1981    Quit date: 01/26/2023    Years since quitting: 1.4   Smokeless tobacco: Never  Vaping Use   Vaping status: Former   Substances: Nicotine   Substance and Sexual Activity   Alcohol use: Not Currently   Drug use: Not Currently   Sexual activity: Yes    Partners: Female    Comment: married to wife  Other Topics Concern   Not on file  Social History Narrative   Lives at home with wife.    Social Drivers of Manufacturing engineer Strain: Low Risk  (01/31/2024)   Overall Financial Resource Strain (CARDIA)    Difficulty of Paying Living Expenses: Not hard at all  Food Insecurity: No Food Insecurity (01/31/2024)   Hunger Vital Sign    Worried About Running Out of Food in the Last Year: Never true    Ran Out of Food in the Last Year: Never true  Transportation Needs: No Transportation Needs (01/31/2024)   PRAPARE - Administrator, Civil Service (Medical): No    Lack of Transportation (Non-Medical): No  Physical Activity: Unknown (01/31/2024)   Exercise Vital Sign    Days of Exercise per Week: 0 days    Minutes of Exercise per Session: Not on file  Stress: No Stress Concern Present (01/31/2024)   [employer] of Occupational Health - Occupational Stress Questionnaire    Feeling of Stress : Only a little  Social Connections: Moderately Isolated (01/31/2024)   Social Connection and Isolation Panel    Frequency of Communication with Friends and Family: More than three times a week    Frequency of Social Gatherings with Friends and Family: Once a week    Attends Religious Services: Never    Database administrator or  Organizations: No    Attends Engineer, structural: Not on file    Marital Status: Married   Current Meds  Medication Sig   albuterol  (VENTOLIN  HFA) 108 (90 Base) MCG/ACT inhaler Inhale 1-2 puffs into the lungs every 6 (six) hours as needed for wheezing or shortness of breath.   ALPRAZolam  (XANAX ) 0.25 MG tablet Take 1 tablet (0.25 mg total) by mouth at bedtime as needed for anxiety.   atorvastatin  (LIPITOR) 20 MG tablet TAKE 1 TABLET BY MOUTH EVERY DAY   cyanocobalamin  (VITAMIN B12) 1000 MCG/ML injection INJECT 1 ML (1,000 MCG TOTAL) INTO THE MUSCLE EVERY 30 DAYS.   docusate sodium  (COLACE) 100 MG capsule Take 100 mg by mouth daily.   levothyroxine  (SYNTHROID ) 50 MCG tablet TAKE 1 TABLET BY MOUTH DAILY BEFORE BREAKFAST   loratadine  (CLARITIN ) 10 MG tablet Take 10 mg by  mouth daily as needed for allergies.   Magnesium  Glycinate 120 MG CAPS Take 120 mg by mouth at bedtime.   metFORMIN  (GLUCOPHAGE ) 1000 MG tablet TAKE 1 TABLET BY MOUTH TWICE A DAY WITH FOOD   methocarbamol  (ROBAXIN ) 500 MG tablet TAKE 1 TABLET BY MOUTH EVERY 6 HOURS AS NEEDED FOR MUSCLE SPASMS.   ondansetron  (ZOFRAN ) 4 MG tablet Take 1 tablet (4 mg total) by mouth every 6 (six) hours as needed for nausea.   oxybutynin  (DITROPAN  XL) 15 MG 24 hr tablet TAKE 1 TABLET BY MOUTH EVERYDAY AT BEDTIME   pantoprazole  (PROTONIX ) 40 MG tablet TAKE 1 TABLET BY MOUTH EVERY DAY   QUEtiapine  (SEROQUEL ) 300 MG tablet TAKE 1 TABLET BY MOUTH EVERYDAY AT BEDTIME   rOPINIRole  (REQUIP ) 0.25 MG tablet TAKE 1 TABLET BY MOUTH THREE TIMES A DAY   sertraline  (ZOLOFT ) 100 MG tablet Take 1 tablet (100 mg total) by mouth daily.   Syringe/Needle, Disp, (SYRINGE 3CC/25GX1) 25G X 1 3 ML MISC 1 Syringe by Does not apply route every 30 (thirty) days.   tirzepatide  (MOUNJARO ) 5 MG/0.5ML Pen Inject 5 mg into the skin once a week.   Allergies  Allergen Reactions   Dust Mite Mixed Allergen Ext [Mite (D. Farinae)]     Respiratory distresss   Nsaids     Acute renal failure   Other Hives    Allergy to Hickory, walnut and Birch trees and all grasses and allergic to Rabbits- patient reports anaphylactic    Sulfa Antibiotics Hives   Latex Itching   Misc. Sulfonamide Containing Compounds Hives   Wound Dressing Adhesive Rash     PHYSICAL EXAMINATION:  General: Patient is well developed, well nourished, calm, collected, and in no apparent distress.   NEUROLOGICAL:  General: In no acute distress.   Awake, alert, oriented to person, place, and time.  Pupils equal round and reactive to light.  Facial tone is symmetric.     Strength:            Side Iliopsoas Quads Hamstring PF DF EHL  R 4 5 5 5 5 5   L 4 5 5 5 5 5    Reflexes 1+ UE, 3+ BLE  Gait wide-based  Incisions c/d/I.     ROS (Neurologic):  Negative except as  noted above  IMAGING: MRI T spine 07/10/2024 IMPRESSION: 1. Spinal cord stimulator in the canal at the level of T7 through T9. Evaluation of the cord at these levels is suboptimal secondary to artifact. 2. 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 3. Moderate canal stenosis at T4-T5 secondary to right central disc  protrusion.     Electronically Signed   By: Clem Savory M.D.   On: 07/15/2024 15:43  ASSESSMENT/PLAN:  Kathleen Carr has recently developed worsening thoracic myelopathy.  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  She now has severe stenosis at that level.  She has objective weakness and issues with balance that were not present a month ago.  She has severe stenosis at T7-8.  This is due to a worsened disc herniation in addition to her spinal cord stimulator.  Based on review of her x-rays, it is unclear whether her lead has migrated superiorly as well.    There is no role for conservative management given her ongoing neurologic issues.  I recommended urgent surgical intervention with T7-8 transpedicular discectomy with T7-8 instrumentation and fusion.  This will require repositioning of her spinal cord stimulator intraoperatively.  I have recommended that the procedure be scheduled on an urgent basis to minimize the risk of neurological decline.  She is currently on Mounjaro .  The urgency of her current neurological symptoms justify proceeding with surgical intervention without waiting the full 7 days since her last dose.  I discussed the planned procedure at length with the patient, including the risks, benefits, alternatives, and indications. The risks discussed include but are not limited to bleeding, infection, need for reoperation, spinal fluid leak, stroke, vision loss, anesthetic complication, coma, paralysis, and even death. I also described in detail that improvement was not guaranteed.  The patient expressed  understanding of these risks, and asked that we proceed with surgery. I described the surgery in layman's terms, and gave ample opportunity for questions, which were answered to the best of my ability.    Reeves Daisy MD Department of neurosurgery

## 2024-07-20 ENCOUNTER — Other Ambulatory Visit: Payer: Self-pay

## 2024-07-20 ENCOUNTER — Encounter
Admission: RE | Admit: 2024-07-20 | Discharge: 2024-07-20 | Disposition: A | Discharge status: Home / Self Care | Source: Ambulatory Visit | Attending: Neurosurgery | Admitting: Neurosurgery

## 2024-07-20 DIAGNOSIS — I10 Essential (primary) hypertension: Secondary | ICD-10-CM | POA: Insufficient documentation

## 2024-07-20 DIAGNOSIS — E119 Type 2 diabetes mellitus without complications: Secondary | ICD-10-CM

## 2024-07-20 DIAGNOSIS — Z01812 Encounter for preprocedural laboratory examination: Secondary | ICD-10-CM | POA: Insufficient documentation

## 2024-07-20 DIAGNOSIS — M5124 Other intervertebral disc displacement, thoracic region: Secondary | ICD-10-CM | POA: Insufficient documentation

## 2024-07-20 DIAGNOSIS — E669 Obesity, unspecified: Secondary | ICD-10-CM | POA: Insufficient documentation

## 2024-07-20 DIAGNOSIS — Z794 Long term (current) use of insulin: Secondary | ICD-10-CM | POA: Insufficient documentation

## 2024-07-20 DIAGNOSIS — Z87448 Personal history of other diseases of urinary system: Secondary | ICD-10-CM | POA: Insufficient documentation

## 2024-07-20 DIAGNOSIS — E1169 Type 2 diabetes mellitus with other specified complication: Secondary | ICD-10-CM | POA: Insufficient documentation

## 2024-07-20 DIAGNOSIS — Z01818 Encounter for other preprocedural examination: Secondary | ICD-10-CM

## 2024-07-20 DIAGNOSIS — M4714 Other spondylosis with myelopathy, thoracic region: Secondary | ICD-10-CM | POA: Insufficient documentation

## 2024-07-20 HISTORY — DX: Other spondylosis with myelopathy, thoracic region: M47.14

## 2024-07-20 HISTORY — DX: Bipolar disorder, unspecified: F31.9

## 2024-07-20 HISTORY — DX: Other intervertebral disc displacement, thoracic region: M51.24

## 2024-07-20 HISTORY — DX: Chronic kidney disease, unspecified: N18.9

## 2024-07-20 HISTORY — DX: Cerebral infarction, unspecified: I63.9

## 2024-07-20 HISTORY — DX: Insomnia, unspecified: G47.00

## 2024-07-20 LAB — URINALYSIS, COMPLETE (UACMP) WITH MICROSCOPIC
Bilirubin Urine: NEGATIVE
Glucose, UA: NEGATIVE mg/dL
Hgb urine dipstick: NEGATIVE
Ketones, ur: NEGATIVE mg/dL
Nitrite: NEGATIVE
Protein, ur: NEGATIVE mg/dL
Specific Gravity, Urine: 1.016 (ref 1.005–1.030)
Squamous Epithelial / HPF: >50 /HPF (ref 0–5)
pH: 5 (ref 5.0–8.0)

## 2024-07-20 LAB — CBC
HCT: 36.8 % (ref 36.0–46.0)
Hemoglobin: 12.4 g/dL (ref 12.0–15.0)
MCH: 30.9 pg (ref 26.0–34.0)
MCHC: 33.7 g/dL (ref 30.0–36.0)
MCV: 91.8 fL (ref 80.0–100.0)
Platelets: 389 K/uL (ref 150–400)
RBC: 4.01 MIL/uL (ref 3.87–5.11)
RDW: 12.9 % (ref 11.5–15.5)
WBC: 10.1 K/uL (ref 4.0–10.5)
nRBC: 0 % (ref 0.0–0.2)

## 2024-07-20 LAB — BASIC METABOLIC PANEL WITH GFR
Anion gap: 12 (ref 5–15)
BUN: 13 mg/dL (ref 6–20)
CO2: 22 mmol/L (ref 22–32)
Calcium: 9.3 mg/dL (ref 8.9–10.3)
Chloride: 103 mmol/L (ref 98–111)
Creatinine, Ser: 0.9 mg/dL (ref 0.44–1.00)
GFR, Estimated: >60 mL/min (ref >60)
Glucose, Bld: 74 mg/dL (ref 70–99)
Potassium: 4.1 mmol/L (ref 3.5–5.1)
Sodium: 137 mmol/L (ref 135–145)

## 2024-07-20 LAB — TYPE AND SCREEN
ABO/RH(D): O POS
Antibody Screen: NEGATIVE

## 2024-07-20 LAB — SURGICAL PCR SCREEN
MRSA, PCR: NEGATIVE
Staphylococcus aureus: NEGATIVE

## 2024-07-20 NOTE — Patient Instructions (Signed)
 Your procedure is scheduled on: Friday 07/22/24 Report to the Registration Desk on the 1st floor of the [facility]. To find out your arrival time, please call [phone] between 1PM - 3PM on: Thursday 07/21/24 If your arrival time is 6:00 am, do not arrive before that time as the [facility] entrance doors do not open until 6:00 am.  REMEMBER: Instructions that are not followed completely may result in serious medical risk, up to and including death; or upon the discretion of your surgeon and anesthesiologist your surgery may need to be rescheduled.  Do not eat food after midnight the night before surgery.  No gum chewing or hard candies.  You may however, drink CLEAR liquids up to 2 hours before you are scheduled to arrive for your surgery. Do not drink anything within 2 hours of your scheduled arrival time.  Clear liquids include: - water   Do NOT drink anything that is not on this list.   One week prior to surgery: Stop Anti-inflammatories (NSAIDS) such as Advil , Aleve , Ibuprofen , Motrin , Naproxen , Naprosyn  and Aspirin  based products such as Excedrin, Goody's Powder, BC Powder. Stop ANY OVER THE COUNTER supplements until after surgery. cyanocobalamin  (VITAMIN B12)  Magnesium  Glycinate    Stop metFORMIN  (GLUCOPHAGE ) 1000 MG 2 days prior to surgery (take last dose 07/19/24) Stop tirzepatide  (MOUNJARO ) 5 MG/0.5ML Pen 7 days prior to surgery (Do not take after 07/14/24)  You may however, continue to take Tylenol  if needed for pain up until the day of surgery.   Continue taking all of your other prescription medications up until the day of surgery.  ON THE DAY OF SURGERY ONLY TAKE THESE MEDICATIONS WITH SIPS OF WATER :  ALPRAZolam  (XANAX ) 0.25 MG  levothyroxine  (SYNTHROID ) 50 MCG  pantoprazole  (PROTONIX ) 40 MG  rOPINIRole  (REQUIP ) 0.25 MG  sertraline  (ZOLOFT ) 100 MG   Use inhalers on the day of surgery and bring to the hospital. albuterol  (VENTOLIN  HFA) 108 (90 Base) MCG/ACT  inhaler   No Alcohol for 24 hours before or after surgery.  No Smoking including e-cigarettes for 24 hours before surgery.  No chewable tobacco products for at least 6 hours before surgery.  No nicotine  patches on the day of surgery.  Do not use any recreational drugs for at least a week (preferably 2 weeks) before your surgery.  Please be advised that the combination of cocaine and anesthesia may have negative outcomes, up to and including death. If you test positive for cocaine, your surgery will be cancelled.  On the morning of surgery brush your teeth with toothpaste and water , you may rinse your mouth with mouthwash if you wish. Do not swallow any toothpaste or mouthwash.  Use CHG Soap or wipes as directed on instruction sheet.  Do not wear jewelry, make-up, hairpins, clips or nail polish.  For welded (permanent) jewelry: bracelets, anklets, waist bands, etc.  Please have this removed prior to surgery.  If it is not removed, there is a chance that hospital personnel will need to cut it off on the day of surgery.  Do not wear lotions, powders, or perfumes.   Do not shave body hair from the neck down 48 hours before surgery.  Contact lenses, hearing aids and dentures may not be worn into surgery.  Do not bring valuables to the hospital. [facility] is not responsible for any missing/lost belongings or valuables.   Bring your C-PAP to the hospital in case you may have to spend the night.   Notify your doctor if  there is any change in your medical condition (cold, fever, infection).  Wear comfortable clothing (specific to your surgery type) to the hospital.  After surgery, you can help prevent lung complications by doing breathing exercises.  Take deep breaths and cough every 1-2 hours. Your doctor may order a device called an Incentive Spirometer to help you take deep breaths. When coughing or sneezing, hold a pillow firmly against your incision with both hands. This is  called "splinting." Doing this helps protect your incision. It also decreases belly discomfort.  If you are being admitted to the hospital overnight, leave your suitcase in the car. After surgery it may be brought to your room.  In case of increased patient census, it may be necessary for you, the patient, to continue your postoperative care in the Same Day Surgery department.  If you are being discharged the day of surgery, you will not be allowed to drive home. You will need a responsible individual to drive you home and stay with you for 24 hours after surgery.   If you are taking public transportation, you will need to have a responsible individual with you.  Please call the Pre-admissions Testing Dept. at [phone] if you have any questions about these instructions.  Surgery Visitation Policy:  Patients having surgery or a procedure may have two visitors.  Children under the age of 40 must have an adult with them who is not the patient.  Inpatient Visitation:    Visiting hours are 7 a.m. to 8 p.m. Up to four visitors are allowed at one time in a patient room. The visitors may rotate out with other people during the day.  One visitor age 9 or older may stay with the patient overnight and must be in the room by 8 p.m.   Merchandiser, retail to address health-related social needs:  [email or web address]    Pre-operative 5 CHG Bath Instructions   You can play a key role in reducing the risk of infection after surgery. Your skin needs to be as free of germs as possible. You can reduce the number of germs on your skin by washing with CHG (chlorhexidine  gluconate) soap before surgery. CHG is an antiseptic soap that kills germs and continues to kill germs even after washing.   DO NOT use if you have an allergy to chlorhexidine /CHG or antibacterial soaps. If your skin becomes reddened or irritated, stop using the CHG and notify one of our RNs at [phone].    Please shower with the CHG soap starting 4 days before surgery using the following schedule:     Please keep in mind the following:  DO NOT shave, including legs and underarms, starting the day of your first shower.   You may shave your face at any point before/day of surgery.  Place clean sheets on your bed the day you start using CHG soap. Use a clean washcloth (not used since being washed) for each shower. DO NOT sleep with pets once you start using the CHG.   CHG Shower Instructions:  If you choose to wash your hair and private area, wash first with your normal shampoo/soap.  After you use shampoo/soap, rinse your hair and body thoroughly to remove shampoo/soap residue.  Turn the water  OFF and apply about 3 tablespoons (45 ml) of CHG soap to a CLEAN washcloth.  Apply CHG soap ONLY FROM YOUR NECK DOWN TO YOUR TOES (washing for 3-5 minutes)  DO NOT use CHG soap on face, private areas, open  wounds, or sores.  Pay special attention to the area where your surgery is being performed.  If you are having back surgery, having someone wash your back for you may be helpful. Wait 2 minutes after CHG soap is applied, then you may rinse off the CHG soap.  Pat dry with a clean towel  Put on clean clothes/pajamas   If you choose to wear lotion, please use ONLY the CHG-compatible lotions on the back of this paper.     Additional instructions for the day of surgery: DO NOT APPLY any lotions, deodorants, cologne, or perfumes.   Put on clean/comfortable clothes.  Brush your teeth.  Ask your nurse before applying any prescription medications to the skin.      CHG Compatible Lotions   Aveeno Moisturizing lotion  Cetaphil Moisturizing Cream  Cetaphil Moisturizing Lotion  Clairol Herbal Essence Moisturizing Lotion, Dry Skin  Clairol Herbal Essence Moisturizing Lotion, Extra Dry Skin  Clairol Herbal Essence Moisturizing Lotion, Normal Skin  Curel Age Defying Therapeutic Moisturizing Lotion  with Alpha Hydroxy  Curel Extreme Care Body Lotion  Curel Soothing Hands Moisturizing Hand Lotion  Curel Therapeutic Moisturizing Cream, Fragrance-Free  Curel Therapeutic Moisturizing Lotion, Fragrance-Free  Curel Therapeutic Moisturizing Lotion, Original Formula  Eucerin Daily Replenishing Lotion  Eucerin Dry Skin Therapy Plus Alpha Hydroxy Crme  Eucerin Dry Skin Therapy Plus Alpha Hydroxy Lotion  Eucerin Original Crme  Eucerin Original Lotion  Eucerin Plus Crme Eucerin Plus Lotion  Eucerin TriLipid Replenishing Lotion  Keri Anti-Bacterial Hand Lotion  Keri Deep Conditioning Original Lotion Dry Skin Formula Softly Scented  Keri Deep Conditioning Original Lotion, Fragrance Free Sensitive Skin Formula  Keri Lotion Fast Absorbing Fragrance Free Sensitive Skin Formula  Keri Lotion Fast Absorbing Softly Scented Dry Skin Formula  Keri Original Lotion  Keri Skin Renewal Lotion Keri Silky Smooth Lotion  Keri Silky Smooth Sensitive Skin Lotion  Nivea Body Creamy Conditioning Oil  Nivea Body Extra Enriched Lotion  Nivea Body Original Lotion  Nivea Body Sheer Moisturizing Lotion Nivea Crme  Nivea Skin Firming Lotion  NutraDerm 30 Skin Lotion  NutraDerm Skin Lotion  NutraDerm Therapeutic Skin Cream  NutraDerm Therapeutic Skin Lotion  ProShield Protective Hand Cream  Provon moisturizing lotion  How to Use an Incentive Spirometer  An incentive spirometer is a tool that measures how well you are filling your lungs with each breath. Learning to take long, deep breaths using this tool can help you keep your lungs clear and active. This may help to reverse or lessen your chance of developing breathing (pulmonary) problems, especially infection. You may be asked to use a spirometer: After a surgery. If you have a lung problem or a history of smoking. After a long period of time when you have been unable to move or be active. If the spirometer includes an indicator to show the highest number  that you have reached, your health care provider or respiratory therapist will help you set a goal. Keep a log of your progress as told by your health care provider. What are the risks? Breathing too quickly may cause dizziness or cause you to pass out. Take your time so you do not get dizzy or light-headed. If you are in pain, you may need to take pain medicine before doing incentive spirometry. It is harder to take a deep breath if you are having pain. How to use your incentive spirometer  Sit up on the edge of your bed or on a chair. Hold the incentive spirometer so  that it is in an upright position. Before you use the spirometer, breathe out normally. Place the mouthpiece in your mouth. Make sure your lips are closed tightly around it. Breathe in slowly and as deeply as you can through your mouth, causing the piston or the ball to rise toward the top of the chamber. Hold your breath for 3-5 seconds, or for as long as possible. If the spirometer includes a coach indicator, use this to guide you in breathing. Slow down your breathing if the indicator goes above the marked areas. Remove the mouthpiece from your mouth and breathe out normally. The piston or ball will return to the bottom of the chamber. Rest for a few seconds, then repeat the steps 10 or more times. Take your time and take a few normal breaths between deep breaths so that you do not get dizzy or light-headed. Do this every 1-2 hours when you are awake. If the spirometer includes a goal marker to show the highest number you have reached (best effort), use this as a goal to work toward during each repetition. After each set of 10 deep breaths, cough a few times. This will help to make sure that your lungs are clear. If you have an incision on your chest or abdomen from surgery, place a pillow or a rolled-up towel firmly against the incision when you cough. This can help to reduce pain while taking deep breaths and coughing. General  tips When you are able to get out of bed: Walk around often. Continue to take deep breaths and cough in order to clear your lungs. Keep using the incentive spirometer until your health care provider says it is okay to stop using it. If you have been in the hospital, you may be told to keep using the spirometer at home. Contact a health care provider if: You are having difficulty using the spirometer. You have trouble using the spirometer as often as instructed. Your pain medicine is not giving enough relief for you to use the spirometer as told. You have a fever. Get help right away if: You develop shortness of breath. You develop a cough with bloody mucus from the lungs. You have fluid or blood coming from an incision site after you cough. Summary An incentive spirometer is a tool that can help you learn to take long, deep breaths to keep your lungs clear and active. You may be asked to use a spirometer after a surgery, if you have a lung problem or a history of smoking, or if you have been inactive for a long period of time. Use your incentive spirometer as instructed every 1-2 hours while you are awake. If you have an incision on your chest or abdomen, place a pillow or a rolled-up towel firmly against your incision when you cough. This will help to reduce pain. Get help right away if you have shortness of breath, you cough up bloody mucus, or blood comes from your incision when you cough. This information is not intended to replace advice given to you by your health care provider. Make sure you discuss any questions you have with your health care provider.

## 2024-07-21 MED ORDER — VANCOMYCIN HCL IN DEXTROSE 1-5 GM/200ML-% IV SOLN
1000.0000 mg | Freq: Once | INTRAVENOUS | Status: AC
  Administered 2024-07-22: 1000 mg via INTRAVENOUS
  Filled 2024-07-21: qty 200

## 2024-07-21 MED ORDER — ORAL CARE MOUTH RINSE: 15.0000 mL | Freq: Once | OROMUCOSAL | Status: AC

## 2024-07-21 MED ORDER — CHLORHEXIDINE GLUCONATE 0.12 % MT SOLN
15.0000 mL | Freq: Once | OROMUCOSAL | Status: AC
Start: 2024-07-21 — End: 2024-07-22
  Administered 2024-07-22: 15 mL via OROMUCOSAL

## 2024-07-21 MED ORDER — SODIUM CHLORIDE 0.9 % IV SOLN: INTRAVENOUS | Status: DC

## 2024-07-22 ENCOUNTER — Other Ambulatory Visit: Payer: Self-pay

## 2024-07-22 ENCOUNTER — Encounter: Payer: Self-pay | Admitting: Neurosurgery

## 2024-07-22 ENCOUNTER — Encounter
Admission: RE | Disposition: A | Discharge status: Home Health | Payer: Self-pay | Source: Home / Self Care | Attending: Neurosurgery

## 2024-07-22 ENCOUNTER — Inpatient Hospital Stay

## 2024-07-22 ENCOUNTER — Inpatient Hospital Stay
Admission: RE | Admit: 2024-07-22 | Discharge: 2024-07-25 | DRG: 451 | Disposition: A | Discharge status: Home Health | Attending: Neurosurgery | Admitting: Neurosurgery

## 2024-07-22 DIAGNOSIS — Z803 Family history of malignant neoplasm of breast: Secondary | ICD-10-CM | POA: Diagnosis not present

## 2024-07-22 DIAGNOSIS — M5104 Intervertebral disc disorders with myelopathy, thoracic region: Secondary | ICD-10-CM | POA: Diagnosis not present

## 2024-07-22 DIAGNOSIS — I10 Essential (primary) hypertension: Secondary | ICD-10-CM | POA: Diagnosis not present

## 2024-07-22 DIAGNOSIS — K219 Gastro-esophageal reflux disease without esophagitis: Secondary | ICD-10-CM | POA: Diagnosis present

## 2024-07-22 DIAGNOSIS — Z886 Allergy status to analgesic agent status: Secondary | ICD-10-CM

## 2024-07-22 DIAGNOSIS — Z833 Family history of diabetes mellitus: Secondary | ICD-10-CM | POA: Diagnosis not present

## 2024-07-22 DIAGNOSIS — Z823 Family history of stroke: Secondary | ICD-10-CM

## 2024-07-22 DIAGNOSIS — M5124 Other intervertebral disc displacement, thoracic region: Secondary | ICD-10-CM | POA: Diagnosis not present

## 2024-07-22 DIAGNOSIS — F4310 Post-traumatic stress disorder, unspecified: Secondary | ICD-10-CM | POA: Diagnosis present

## 2024-07-22 DIAGNOSIS — Z8249 Family history of ischemic heart disease and other diseases of the circulatory system: Secondary | ICD-10-CM

## 2024-07-22 DIAGNOSIS — M4714 Other spondylosis with myelopathy, thoracic region: Secondary | ICD-10-CM | POA: Diagnosis not present

## 2024-07-22 DIAGNOSIS — E1169 Type 2 diabetes mellitus with other specified complication: Principal | ICD-10-CM

## 2024-07-22 DIAGNOSIS — Z9682 Presence of neurostimulator: Secondary | ICD-10-CM | POA: Diagnosis not present

## 2024-07-22 DIAGNOSIS — M4804 Spinal stenosis, thoracic region: Secondary | ICD-10-CM | POA: Diagnosis present

## 2024-07-22 DIAGNOSIS — Z8349 Family history of other endocrine, nutritional and metabolic diseases: Secondary | ICD-10-CM | POA: Diagnosis not present

## 2024-07-22 DIAGNOSIS — Z8673 Personal history of transient ischemic attack (TIA), and cerebral infarction without residual deficits: Secondary | ICD-10-CM

## 2024-07-22 DIAGNOSIS — Z87891 Personal history of nicotine dependence: Secondary | ICD-10-CM

## 2024-07-22 DIAGNOSIS — E119 Type 2 diabetes mellitus without complications: Secondary | ICD-10-CM | POA: Diagnosis present

## 2024-07-22 DIAGNOSIS — Z882 Allergy status to sulfonamides status: Secondary | ICD-10-CM

## 2024-07-22 DIAGNOSIS — F419 Anxiety disorder, unspecified: Secondary | ICD-10-CM | POA: Diagnosis present

## 2024-07-22 DIAGNOSIS — Z91040 Latex allergy status: Secondary | ICD-10-CM

## 2024-07-22 DIAGNOSIS — Z723 Lack of physical exercise: Secondary | ICD-10-CM

## 2024-07-22 DIAGNOSIS — E039 Hypothyroidism, unspecified: Secondary | ICD-10-CM | POA: Diagnosis not present

## 2024-07-22 DIAGNOSIS — Z01818 Encounter for other preprocedural examination: Secondary | ICD-10-CM

## 2024-07-22 DIAGNOSIS — F319 Bipolar disorder, unspecified: Secondary | ICD-10-CM | POA: Diagnosis not present

## 2024-07-22 DIAGNOSIS — Z9181 History of falling: Secondary | ICD-10-CM | POA: Diagnosis not present

## 2024-07-22 DIAGNOSIS — Z01812 Encounter for preprocedural laboratory examination: Secondary | ICD-10-CM

## 2024-07-22 DIAGNOSIS — G894 Chronic pain syndrome: Secondary | ICD-10-CM | POA: Diagnosis present

## 2024-07-22 DIAGNOSIS — Z96641 Presence of right artificial hip joint: Secondary | ICD-10-CM | POA: Diagnosis not present

## 2024-07-22 DIAGNOSIS — Z90710 Acquired absence of both cervix and uterus: Secondary | ICD-10-CM

## 2024-07-22 DIAGNOSIS — Z801 Family history of malignant neoplasm of trachea, bronchus and lung: Secondary | ICD-10-CM | POA: Diagnosis not present

## 2024-07-22 DIAGNOSIS — Z981 Arthrodesis status: Secondary | ICD-10-CM

## 2024-07-22 DIAGNOSIS — Z23 Encounter for immunization: Secondary | ICD-10-CM | POA: Diagnosis not present

## 2024-07-22 DIAGNOSIS — J45909 Unspecified asthma, uncomplicated: Secondary | ICD-10-CM | POA: Diagnosis present

## 2024-07-22 DIAGNOSIS — R531 Weakness: Secondary | ICD-10-CM | POA: Diagnosis present

## 2024-07-22 DIAGNOSIS — Z87448 Personal history of other diseases of urinary system: Secondary | ICD-10-CM

## 2024-07-22 DIAGNOSIS — E669 Obesity, unspecified: Secondary | ICD-10-CM

## 2024-07-22 HISTORY — PX: THORACIC DISCECTOMY: SHX6113

## 2024-07-22 HISTORY — PX: APPLICATION OF INTRAOPERATIVE CT SCAN: SHX6668

## 2024-07-22 LAB — GLUCOSE, CAPILLARY
Glucose-Capillary: 85 mg/dL (ref 70–99)
Glucose-Capillary: 95 mg/dL (ref 70–99)

## 2024-07-22 SURGERY — THORACIC DISCECTOMY
Anesthesia: General | Site: Spine Thoracic

## 2024-07-22 MED ORDER — FENTANYL CITRATE (PF) 100 MCG/2ML IJ SOLN
INTRAMUSCULAR | Status: DC | PRN
  Administered 2024-07-22 (×2): 50 ug via INTRAVENOUS

## 2024-07-22 MED ORDER — SURGIFLO WITH THROMBIN (HEMOSTATIC MATRIX KIT) OPTIME
TOPICAL | Status: DC | PRN
  Administered 2024-07-22: 1 via TOPICAL

## 2024-07-22 MED ORDER — SODIUM CHLORIDE 0.9% FLUSH
3.0000 mL | Freq: Two times a day (BID) | INTRAVENOUS | Status: DC
  Administered 2024-07-22 – 2024-07-25 (×6): 3 mL via INTRAVENOUS

## 2024-07-22 MED ORDER — FENTANYL CITRATE (PF) 100 MCG/2ML IJ SOLN
25.0000 ug | INTRAMUSCULAR | Status: AC | PRN
  Administered 2024-07-22: 25 ug via INTRAVENOUS
  Administered 2024-07-22 (×2): 50 ug via INTRAVENOUS
  Administered 2024-07-22 (×3): 25 ug via INTRAVENOUS

## 2024-07-22 MED ORDER — METHOCARBAMOL 1000 MG/10ML IJ SOLN: 500.0000 mg | Freq: Four times a day (QID) | INTRAMUSCULAR | Status: DC | PRN

## 2024-07-22 MED ORDER — BUPIVACAINE-EPINEPHRINE (PF) 0.5% -1:200000 IJ SOLN
INTRAMUSCULAR | Status: DC | PRN
  Administered 2024-07-22: 10 mL

## 2024-07-22 MED ORDER — ROCURONIUM BROMIDE 10 MG/ML (PF) SYRINGE
PREFILLED_SYRINGE | INTRAVENOUS | Status: AC
Start: 2024-07-22 — End: 2024-07-22
  Filled 2024-07-22: qty 10

## 2024-07-22 MED ORDER — HYDROMORPHONE HCL 1 MG/ML IJ SOLN
0.5000 mg | INTRAMUSCULAR | Status: DC | PRN
  Administered 2024-07-22 (×2): 0.5 mg via INTRAVENOUS

## 2024-07-22 MED ORDER — GLYCOPYRROLATE 0.2 MG/ML IJ SOLN
INTRAMUSCULAR | Status: DC | PRN
  Administered 2024-07-22: .2 mg via INTRAVENOUS

## 2024-07-22 MED ORDER — SUCCINYLCHOLINE CHLORIDE 200 MG/10ML IV SOSY
PREFILLED_SYRINGE | INTRAVENOUS | Status: DC | PRN
  Administered 2024-07-22: 100 mg via INTRAVENOUS

## 2024-07-22 MED ORDER — REMIFENTANIL HCL 1 MG IV SOLR
INTRAVENOUS | Status: AC
  Filled 2024-07-22: qty 1000

## 2024-07-22 MED ORDER — PHENYLEPHRINE 80 MCG/ML (10ML) SYRINGE FOR IV PUSH (FOR BLOOD PRESSURE SUPPORT)
PREFILLED_SYRINGE | INTRAVENOUS | Status: DC | PRN
  Administered 2024-07-22: 160 ug via INTRAVENOUS
  Administered 2024-07-22: 80 ug via INTRAVENOUS
  Administered 2024-07-22: 160 ug via INTRAVENOUS

## 2024-07-22 MED ORDER — ONDANSETRON HCL 4 MG/2ML IJ SOLN
INTRAMUSCULAR | Status: AC
  Filled 2024-07-22: qty 2

## 2024-07-22 MED ORDER — FENTANYL CITRATE (PF) 100 MCG/2ML IJ SOLN
INTRAMUSCULAR | Status: AC
  Filled 2024-07-22: qty 2

## 2024-07-22 MED ORDER — SODIUM CHLORIDE (PF) 0.9 % IJ SOLN
INTRAMUSCULAR | Status: AC
  Filled 2024-07-22: qty 20

## 2024-07-22 MED ORDER — MIDAZOLAM HCL 2 MG/2ML IJ SOLN
INTRAMUSCULAR | Status: AC
  Filled 2024-07-22: qty 2

## 2024-07-22 MED ORDER — LIDOCAINE HCL (PF) 2 % IJ SOLN
INTRAMUSCULAR | Status: AC
  Filled 2024-07-22: qty 5

## 2024-07-22 MED ORDER — CHLORHEXIDINE GLUCONATE 0.12 % MT SOLN
OROMUCOSAL | Status: AC
  Filled 2024-07-22: qty 15

## 2024-07-22 MED ORDER — DOCUSATE SODIUM 100 MG PO CAPS
100.0000 mg | ORAL_CAPSULE | Freq: Two times a day (BID) | ORAL | Status: DC
  Administered 2024-07-22 – 2024-07-24 (×5): 100 mg via ORAL
  Filled 2024-07-22 (×6): qty 1

## 2024-07-22 MED ORDER — SUCCINYLCHOLINE CHLORIDE 200 MG/10ML IV SOSY
PREFILLED_SYRINGE | INTRAVENOUS | Status: AC
  Filled 2024-07-22: qty 10

## 2024-07-22 MED ORDER — ONDANSETRON HCL 4 MG PO TABS: 4.0000 mg | ORAL_TABLET | Freq: Four times a day (QID) | ORAL | Status: DC | PRN

## 2024-07-22 MED ORDER — ONDANSETRON HCL 4 MG/2ML IJ SOLN: 4.0000 mg | Freq: Four times a day (QID) | INTRAMUSCULAR | Status: DC | PRN

## 2024-07-22 MED ORDER — SODIUM CHLORIDE (PF) 0.9 % IJ SOLN
INTRAMUSCULAR | Status: DC | PRN
  Administered 2024-07-22: 60 mL

## 2024-07-22 MED ORDER — SODIUM CHLORIDE 0.9 % IV SOLN: 250.0000 mL | INTRAVENOUS | Status: AC

## 2024-07-22 MED ORDER — BUPIVACAINE HCL (PF) 0.5 % IJ SOLN
INTRAMUSCULAR | Status: AC
  Filled 2024-07-22: qty 30

## 2024-07-22 MED ORDER — PHENYLEPHRINE HCL-NACL 20-0.9 MG/250ML-% IV SOLN
INTRAVENOUS | Status: AC
  Filled 2024-07-22: qty 250

## 2024-07-22 MED ORDER — IRRISEPT - 450ML BOTTLE WITH 0.05% CHG IN STERILE WATER, USP 99.95% OPTIME
TOPICAL | Status: DC | PRN
  Administered 2024-07-22: 450 mL

## 2024-07-22 MED ORDER — METHOCARBAMOL 500 MG PO TABS
ORAL_TABLET | ORAL | Status: AC
Start: 2024-07-22 — End: 2024-07-22
  Filled 2024-07-22: qty 1

## 2024-07-22 MED ORDER — ACETAMINOPHEN 325 MG PO TABS: 650.0000 mg | ORAL_TABLET | ORAL | Status: DC | PRN

## 2024-07-22 MED ORDER — BUPIVACAINE LIPOSOME 1.3 % IJ SUSP
INTRAMUSCULAR | Status: AC
Start: 2024-07-22 — End: 2024-07-22
  Filled 2024-07-22: qty 20

## 2024-07-22 MED ORDER — ACETAMINOPHEN 500 MG PO TABS
1000.0000 mg | ORAL_TABLET | Freq: Four times a day (QID) | ORAL | Status: DC
  Administered 2024-07-22 – 2024-07-25 (×11): 1000 mg via ORAL
  Filled 2024-07-22 (×12): qty 2

## 2024-07-22 MED ORDER — CEFAZOLIN SODIUM-DEXTROSE 2-4 GM/100ML-% IV SOLN
INTRAVENOUS | Status: AC
  Filled 2024-07-22: qty 100

## 2024-07-22 MED ORDER — PROPOFOL 1000 MG/100ML IV EMUL
INTRAVENOUS | Status: AC
  Filled 2024-07-22: qty 100

## 2024-07-22 MED ORDER — SENNA 8.6 MG PO TABS
1.0000 | ORAL_TABLET | Freq: Two times a day (BID) | ORAL | Status: DC
  Administered 2024-07-22 – 2024-07-25 (×7): 8.6 mg via ORAL
  Filled 2024-07-22 (×8): qty 1

## 2024-07-22 MED ORDER — HYDROMORPHONE HCL 1 MG/ML IJ SOLN
INTRAMUSCULAR | Status: AC
  Filled 2024-07-22: qty 1

## 2024-07-22 MED ORDER — SODIUM CHLORIDE 0.9% FLUSH: 3.0000 mL | INTRAVENOUS | Status: DC | PRN

## 2024-07-22 MED ORDER — MIDAZOLAM HCL 2 MG/2ML IJ SOLN
INTRAMUSCULAR | Status: DC | PRN
  Administered 2024-07-22: 2 mg via INTRAVENOUS

## 2024-07-22 MED ORDER — ONDANSETRON HCL 4 MG/2ML IJ SOLN
INTRAMUSCULAR | Status: DC | PRN
  Administered 2024-07-22 (×2): 4 mg via INTRAVENOUS

## 2024-07-22 MED ORDER — DEXAMETHASONE SODIUM PHOSPHATE 10 MG/ML IJ SOLN
INTRAMUSCULAR | Status: AC
Start: 2024-07-22 — End: 2024-07-22
  Filled 2024-07-22: qty 1

## 2024-07-22 MED ORDER — REMIFENTANIL HCL 1 MG IV SOLR
INTRAVENOUS | Status: DC | PRN
  Administered 2024-07-22: .2 ug/kg/min via INTRAVENOUS

## 2024-07-22 MED ORDER — PROPOFOL 10 MG/ML IV BOLUS
INTRAVENOUS | Status: DC | PRN
  Administered 2024-07-22: 50 mg via INTRAVENOUS
  Administered 2024-07-22: 100 mg via INTRAVENOUS
  Administered 2024-07-22: 50 mg via INTRAVENOUS
  Administered 2024-07-22: 200 mg via INTRAVENOUS

## 2024-07-22 MED ORDER — ACETAMINOPHEN 650 MG RE SUPP: 650.0000 mg | RECTAL | Status: DC | PRN

## 2024-07-22 MED ORDER — SORBITOL 70 % SOLN: 30.0000 mL | Freq: Every day | Status: DC | PRN

## 2024-07-22 MED ORDER — PROPOFOL 10 MG/ML IV BOLUS
INTRAVENOUS | Status: AC
Start: 2024-07-22 — End: 2024-07-22
  Filled 2024-07-22: qty 20

## 2024-07-22 MED ORDER — CEFAZOLIN SODIUM-DEXTROSE 2-4 GM/100ML-% IV SOLN
2.0000 g | Freq: Once | INTRAVENOUS | Status: AC
  Administered 2024-07-22: 2 g via INTRAVENOUS

## 2024-07-22 MED ORDER — OXYCODONE HCL 5 MG PO TABS
5.0000 mg | ORAL_TABLET | Freq: Once | ORAL | Status: AC | PRN
  Administered 2024-07-22: 5 mg via ORAL

## 2024-07-22 MED ORDER — PROPOFOL 10 MG/ML IV BOLUS
INTRAVENOUS | Status: AC
  Filled 2024-07-22: qty 20

## 2024-07-22 MED ORDER — DEXMEDETOMIDINE HCL IN NACL 200 MCG/50ML IV SOLN
INTRAVENOUS | Status: DC | PRN
  Administered 2024-07-22: 8 ug via INTRAVENOUS

## 2024-07-22 MED ORDER — POLYETHYLENE GLYCOL 3350 17 G PO PACK: 17.0000 g | PACK | Freq: Every day | ORAL | Status: DC | PRN

## 2024-07-22 MED ORDER — BUPIVACAINE-EPINEPHRINE (PF) 0.5% -1:200000 IJ SOLN
INTRAMUSCULAR | Status: AC
Start: 2024-07-22 — End: 2024-07-22
  Filled 2024-07-22: qty 10

## 2024-07-22 MED ORDER — METHOCARBAMOL 500 MG PO TABS
500.0000 mg | ORAL_TABLET | Freq: Four times a day (QID) | ORAL | Status: DC | PRN
  Administered 2024-07-22 – 2024-07-25 (×6): 500 mg via ORAL
  Filled 2024-07-22 (×6): qty 1

## 2024-07-22 MED ORDER — ACETAMINOPHEN 10 MG/ML IV SOLN
INTRAVENOUS | Status: DC | PRN
  Administered 2024-07-22: 1000 mg via INTRAVENOUS

## 2024-07-22 MED ORDER — DEXAMETHASONE SODIUM PHOSPHATE 10 MG/ML IJ SOLN
INTRAMUSCULAR | Status: DC | PRN
  Administered 2024-07-22: 10 mg via INTRAVENOUS

## 2024-07-22 MED ORDER — MAGNESIUM CITRATE PO SOLN: 1.0000 | Freq: Once | ORAL | Status: DC | PRN

## 2024-07-22 MED ORDER — PHENYLEPHRINE HCL-NACL 20-0.9 MG/250ML-% IV SOLN
INTRAVENOUS | Status: DC | PRN
  Administered 2024-07-22: 25 ug/min via INTRAVENOUS

## 2024-07-22 MED ORDER — HYDROMORPHONE HCL 1 MG/ML IJ SOLN
0.5000 mg | INTRAMUSCULAR | Status: AC | PRN
  Administered 2024-07-22 – 2024-07-23 (×5): 0.5 mg via INTRAVENOUS
  Filled 2024-07-22 (×5): qty 0.5

## 2024-07-22 MED ORDER — ENOXAPARIN SODIUM 40 MG/0.4ML IJ SOSY
40.0000 mg | PREFILLED_SYRINGE | INTRAMUSCULAR | Status: DC
  Administered 2024-07-23 – 2024-07-25 (×3): 40 mg via SUBCUTANEOUS
  Filled 2024-07-22 (×3): qty 0.4

## 2024-07-22 MED ORDER — PHENYLEPHRINE 80 MCG/ML (10ML) SYRINGE FOR IV PUSH (FOR BLOOD PRESSURE SUPPORT)
PREFILLED_SYRINGE | INTRAVENOUS | Status: AC
Start: 2024-07-22 — End: 2024-07-22
  Filled 2024-07-22: qty 10

## 2024-07-22 MED ORDER — OXYCODONE HCL 5 MG PO TABS
ORAL_TABLET | ORAL | Status: AC
  Filled 2024-07-22: qty 1

## 2024-07-22 MED ORDER — OXYCODONE HCL 5 MG PO TABS
10.0000 mg | ORAL_TABLET | ORAL | Status: DC | PRN
  Administered 2024-07-23 – 2024-07-25 (×9): 10 mg via ORAL
  Filled 2024-07-22 (×10): qty 2

## 2024-07-22 MED ORDER — PROPOFOL 1000 MG/100ML IV EMUL
INTRAVENOUS | Status: AC
Start: 2024-07-22 — End: 2024-07-22
  Filled 2024-07-22: qty 100

## 2024-07-22 MED ORDER — 0.9 % SODIUM CHLORIDE (POUR BTL) OPTIME
TOPICAL | Status: DC | PRN
  Administered 2024-07-22: 500 mL

## 2024-07-22 MED ORDER — ACETAMINOPHEN 10 MG/ML IV SOLN
INTRAVENOUS | Status: AC
Start: 2024-07-22 — End: 2024-07-22
  Filled 2024-07-22: qty 100

## 2024-07-22 MED ORDER — LIDOCAINE HCL (CARDIAC) PF 100 MG/5ML IV SOSY
PREFILLED_SYRINGE | INTRAVENOUS | Status: DC | PRN
  Administered 2024-07-22: 100 mg via INTRAVENOUS

## 2024-07-22 MED ORDER — PROPOFOL 500 MG/50ML IV EMUL
INTRAVENOUS | Status: DC | PRN
  Administered 2024-07-22: 125 ug/kg/min via INTRAVENOUS

## 2024-07-22 MED ORDER — OXYCODONE HCL 5 MG PO TABS
5.0000 mg | ORAL_TABLET | ORAL | Status: DC | PRN
  Administered 2024-07-22 – 2024-07-23 (×5): 5 mg via ORAL
  Filled 2024-07-22 (×6): qty 1

## 2024-07-22 MED ORDER — HYDROMORPHONE HCL 1 MG/ML IJ SOLN
INTRAMUSCULAR | Status: DC | PRN
  Administered 2024-07-22 (×2): .5 mg via INTRAVENOUS

## 2024-07-22 MED ORDER — OXYCODONE HCL 5 MG/5ML PO SOLN: 5.0000 mg | Freq: Once | ORAL | Status: AC | PRN

## 2024-07-22 SURGICAL SUPPLY — 57 items
ALLOGRAFT BONESTRIP KORE 2.5X5 (Bone Implant) IMPLANT
BASIN KIT SINGLE STR (MISCELLANEOUS) ×1 IMPLANT
BUR NEURO DRILL SOFT 3.0X3.8M (BURR) ×1 IMPLANT
COVER PROBE FLX POLY STRL (MISCELLANEOUS) IMPLANT
COVER PROBE GAMMA FINDER SLV (MISCELLANEOUS) IMPLANT
DERMABOND ADVANCED .7 DNX12 (GAUZE/BANDAGES/DRESSINGS) ×1 IMPLANT
DRAPE C ARM PK CFD 31 SPINE (DRAPES) ×1 IMPLANT
DRAPE C-ARMOR (DRAPES) IMPLANT
DRAPE LAPAROTOMY 100X77 ABD (DRAPES) ×1 IMPLANT
DRAPE LAPAROTOMY 77X122 PED (DRAPES) ×1 IMPLANT
DRAPE SCAN PATIENT (DRAPES) ×1 IMPLANT
DRAPE SPINE LEICA/WILD 54X150 (DRAPES) ×1 IMPLANT
DRAPE TABLE BACK 80X90 (DRAPES) ×1 IMPLANT
DRSG OPSITE POSTOP 4X6 (GAUZE/BANDAGES/DRESSINGS) IMPLANT
DRSG OPSITE POSTOP 4X8 (GAUZE/BANDAGES/DRESSINGS) IMPLANT
DRSG TEGADERM 4X4.75 (GAUZE/BANDAGES/DRESSINGS) IMPLANT
ELECTRODE REM PT RTRN 9FT ADLT (ELECTROSURGICAL) ×1 IMPLANT
EVACUATOR 1/8 PVC DRAIN (DRAIN) IMPLANT
EX-PIN ORTHOLOCK NAV 4X150 (PIN) IMPLANT
FEE CVG SUPP BRAINLAB NG SPNE (MISCELLANEOUS) ×1 IMPLANT
FEE INTRAOP CADWELL SUPPLY NCS (MISCELLANEOUS) IMPLANT
FEE INTRAOP MONITOR IMPULS NCS (MISCELLANEOUS) IMPLANT
GAUZE SPONGE 2X2 STRL 8-PLY (GAUZE/BANDAGES/DRESSINGS) IMPLANT
GLOVE BIOGEL PI IND STRL 6.5 (GLOVE) ×1 IMPLANT
GLOVE SURG SYN 6.5 PF PI (GLOVE) ×2 IMPLANT
GLOVE SURG SYN 8.5 PF PI (GLOVE) ×3 IMPLANT
GOWN SRG LRG LVL 4 IMPRV REINF (GOWNS) ×2 IMPLANT
GOWN SRG XL LVL 3 NONREINFORCE (GOWNS) ×1 IMPLANT
HOLDER FOLEY CATH W/STRAP (MISCELLANEOUS) IMPLANT
KIT PREVENA INCISION MGT 13 (CANNISTER) IMPLANT
KIT SPINAL PRONEVIEW (KITS) ×1 IMPLANT
KIT TURNOVER KIT A (KITS) ×1 IMPLANT
LAVAGE JET IRRISEPT WOUND (IRRIGATION / IRRIGATOR) ×1 IMPLANT
MANIFOLD NEPTUNE II (INSTRUMENTS) ×1 IMPLANT
MARKER SKIN DUAL TIP RULER LAB (MISCELLANEOUS) ×1 IMPLANT
MARKER SPHERE PSV REFLC 13MM (MARKER) ×7 IMPLANT
NDL HYPO 22X1.5 SAFETY MO (MISCELLANEOUS) IMPLANT
NDL SAFETY ECLIPSE 18X1.5 (NEEDLE) ×1 IMPLANT
NEEDLE HYPO 22X1.5 SAFETY MO (MISCELLANEOUS) ×1 IMPLANT
NS IRRIG 500ML POUR BTL (IV SOLUTION) ×1 IMPLANT
PACK LAMINECTOMY ARMC (PACKS) ×1 IMPLANT
PAD ARMBOARD POSITIONER FOAM (MISCELLANEOUS) ×2 IMPLANT
ROD RELINE-O LORD 5.5X40 (Rod) IMPLANT
SCREW LOCK RELINE 5.5 TULIP (Screw) IMPLANT
SCREW RELINE-O POLY 5.5X40 (Screw) IMPLANT
STAPLER SKIN PROX 35W (STAPLE) IMPLANT
SURGIFLO W/THROMBIN 8M KIT (HEMOSTASIS) ×2 IMPLANT
SUT SILK 3 0 SH 30 (SUTURE) IMPLANT
SUT STRATA 3-0 15 PS-2 (SUTURE) ×1 IMPLANT
SUT VIC AB 0 CT1 27XCR 8 STRN (SUTURE) ×2 IMPLANT
SUT VIC AB 2-0 CT1 18 (SUTURE) ×2 IMPLANT
SUTURE EHLN 3-0 FS-10 30 BLK (SUTURE) IMPLANT
SYR 20ML LL LF (SYRINGE) ×1 IMPLANT
SYR 30ML LL (SYRINGE) ×2 IMPLANT
TAPE CLOTH 3X10 WHT NS LF (GAUZE/BANDAGES/DRESSINGS) ×3 IMPLANT
TRAP FLUID SMOKE EVACUATOR (MISCELLANEOUS) ×1 IMPLANT
TRAY FOLEY SLVR 16FR LF STAT (SET/KITS/TRAYS/PACK) IMPLANT

## 2024-07-22 NOTE — Discharge Instructions (Signed)
 Your surgeon has performed an operation on your spine to relieve pressure on one or more nerves. Many times, patients feel better immediately after surgery and can "overdo it." Even if you feel well, it is important that you follow these activity guidelines. If you do not let your back heal properly from the surgery, you can increase the chance of hardware complications and/or return of your symptoms. The following are instructions to help in your recovery once you have been discharged from the hospital.  Do not use NSAIDs for 3 months after surgery.  *Regarding compression stockings-  Please wear day and night until you are walking a couple hundred feet three times a day.   Activity    No bending, lifting, or twisting ("BLT"). Avoid lifting objects heavier than 10 pounds (gallon milk jug).  Where possible, avoid household activities that involve lifting, bending, pushing, or pulling such as laundry, vacuuming, grocery shopping, and childcare. Try to arrange for help from friends and family for these activities while your back heals.  Increase physical activity slowly as tolerated.  Taking short walks is encouraged, but avoid strenuous exercise. Do not jog, run, bicycle, lift weights, or participate in any other exercises unless specifically allowed by your doctor. Avoid prolonged sitting, including car rides.  Talk to your doctor before resuming sexual activity.  You should not drive until cleared by your doctor.  Until released by your doctor, you should not return to work or school.  You should rest at home and let your body heal.   You may shower three days after your surgery.  After showering, lightly dab your incision dry. Do not take a tub bath or go swimming for 3 weeks, or until approved by your doctor at your follow-up appointment.  If you smoke, we strongly recommend that you quit.  Smoking has been proven to interfere with normal healing in your back and will dramatically reduce the  success rate of your surgery. Please contact [facility] (800-QUIT-NOW) and use the resources at www.[facility].com for assistance in stopping smoking.  Surgical Incision   If you have a dressing on your incision, you may remove it three days after your surgery. Keep your incision area clean and dry.  Your incision was closed with Dermabond glue. The glue should begin to peel away within about a week.  Diet            You may return to your usual diet. Be sure to stay hydrated.  When to Contact Us   Although your surgery and recovery will likely be uneventful, you may have some residual numbness, aches, and pains in your back and/or legs. This is normal and should improve in the next few weeks.  However, should you experience any of the following, contact us  immediately: New numbness or weakness Pain that is progressively getting worse, and is not relieved by your pain medications or rest Bleeding, redness, swelling, pain, or drainage from surgical incision Chills or flu-like symptoms Fever greater than 101.0 F (38.3 C) Problems with bowel or bladder functions Difficulty breathing or shortness of breath Warmth, tenderness, or swelling in your calf  Contact Information How to contact us :  If you have any questions/concerns before or after surgery, you can reach us  at [phone], or you can send a mychart message. We can be reached by phone or mychart 8am-4pm, Monday-Friday.  *Please note: Calls after 4pm are forwarded to a third party answering service. Mychart messages are not routinely monitored during evenings, weekends, and holidays.  Please call our office to contact the answering service for urgent concerns during non-business hours.

## 2024-07-22 NOTE — Anesthesia Procedure Notes (Signed)
 Procedure Name: Intubation Date/Time: 07/22/2024 7:22 AM  Performed by: Ledora Duncan, CRNAPre-anesthesia Checklist: Patient identified, Emergency Drugs available, Suction available and Patient being monitored Patient Re-evaluated:Patient Re-evaluated prior to induction Oxygen Delivery Method: Circle system utilized Preoxygenation: Pre-oxygenation with 100% oxygen Induction Type: IV induction Ventilation: Mask ventilation without difficulty Laryngoscope Size: McGrath and 3 Grade View: Grade II Tube type: Oral Tube size: 7.0 mm Number of attempts: 1 Airway Equipment and Method: Stylet and Oral airway Placement Confirmation: ETT inserted through vocal cords under direct vision, positive ETCO2 and breath sounds checked- equal and bilateral Tube secured with: Tape Dental Injury: Teeth and Oropharynx as per pre-operative assessment

## 2024-07-22 NOTE — Transfer of Care (Signed)
 Immediate Anesthesia Transfer of Care Note  Patient: Kathleen Carr  Procedure(s) Performed: THORACIC DISCECTOMY (Spine Thoracic) POSTERIOR THORACIC FUSION 1 LEVEL (Spine Thoracic) APPLICATION OF INTRAOPERATIVE CT SCAN (Spine Thoracic)  Patient Location: PACU  Anesthesia Type:General  Level of Consciousness: awake, drowsy, and patient cooperative  Airway & Oxygen Therapy: Patient Spontanous Breathing and Patient connected to face mask oxygen  Post-op Assessment: Report given to RN and Post -op Vital signs reviewed and stable  Post vital signs: Reviewed and stable  Last Vitals:  Vitals Value Taken Time  BP 112/70 07/22/24 11:00  Temp    Pulse 86 07/22/24 11:04  Resp 17 07/22/24 11:04  SpO2 95 % 07/22/24 11:04  Vitals shown include unfiled device data.  Last Pain:  Vitals:   07/22/24 0633  TempSrc: Temporal  PainSc: 8          Complications: No notable events documented.

## 2024-07-22 NOTE — Op Note (Signed)
 Indications: Ms. 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 The patient fell a few weeks ago and suffered a disc herniation at T7-8 where her spinal cord stimulator had been placed.  She developed weakness prompting concern for thoracic spinal cord compression.  MRI scan showed disc herniation causing significant impingement on her thoracic spinal cord at that level.    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   Preoperative Note:   Risks of surgery discussed include: infection, bleeding, stroke, coma, death, paralysis, CSF leak, nerve/spinal cord injury, numbness, tingling, weakness, complex regional pain syndrome, recurrent stenosis and/or disc herniation, vascular injury, development of instability, neck/back pain, need for further surgery, persistent symptoms, development of deformity, and the risks of anesthesia. The patient understood these risks and agreed to proceed.  Operative Note:  1.  Right sided transpedicular discectomy at T7-8 including T7 and T8 laminectomies 2. Posterolateral arthrodesis T7 to T8 3. Posterior nonsegmental instrumentation T7 to T8 using Nuvasive Reline implants 4. Harvesting of autograft via the same incision 5. Use of stereotaxis    The patient was brought to the Operating Room, intubated and turned into the prone position. All pressure points were checked and double checked. The prior incision was marked. The patient was prepped and draped in the standard fashion. A full timeout was performed. Preoperative antibiotics were given. The incision was injected with local anesthetic.  The incision was opened with a scalpel,  then the soft tissues divided with the Bovie. Self-retaining retractors were placed. The paraspinus muscles were reflected laterally in subperiosteal fashion until the transverse processes were visible.   The previously placed spinal cord stimulator leads were identified.  This assisted in localization.  The stereotactic array was placed.  Stereotactic images were acquired and registered to the patient.  We then used stereotactically guided drill guides to cannulate the pedicles bilaterally from T7 to T8.   The pedicle screws were placed on the left and on the right side of T7. Nuvasive Reline screws were used.    After placement of pedicle screws, I utilized the high-speed drill to perform laminectomy of the inferior two thirds of T7 and the entirety of T8.  The right T7 intra-articular in process was removed.  The right T8 superior articulating process was removed.  The ligamentum flavum was identified and a plane developed.  Using the 2 mm punch, the remainder of the ligamentum flavum and soft tissue overlying the spinal cord was removed.  The spinal cord stimulator lead was identified.  This was carefully removed from the dorsum of the spinal cord and protected.  A Penfield 4 was used to develop the epidural plane on the right lateral aspect of the spinal cord.  Using a Penfield 1, the spinal cord was protected for removal of the superior 50% of the right T8 pedicle.  The disc herniation was palpated.  While protecting the right T7 nerve root and the lateral aspect of the dura, the drill was used to drill into the superior portion of the right T8 vertebral body and into the disc.  This was used to create a trough into which the herniated disc material could be pushed.  The Leavy was used to develop the epidural plane ventral to the spinal  cord.  The disc herniation was palpated.  Using the down pushing curette, the disc herniation material was pushed into the space created with the drill.  This  was then removed in piecemeal fashion until the spinal cord was no longer compressed.  Intraoperative monitoring was utilized to confirm that there was no change in spinal cord signal.  After confirmation of decompression, the spinal cord stimulator lead was placed back on the dorsum of the spinal cord.  It was confirmed in position.  Using the penetrating towel clamps, a hole was made on each side of the pars of T8.  A 2-0 silk suture was then secured between the 2 holes to keep the lead in position.  Monitoring was checked 1 additional time.  The right sided T8 pedicle screw was placed.  Rods were placed and secured.  The stereotactic C-arm was then brought back in the field to confirm placement of all implants.  These were confirmed in good position.  The wound was copiously irrigated, then the external surfaces of the remaining lamina, facet, and transverse processes from T7 to T8 were decorticated. A mixture of allograft and autograft was placed over the decorticated surfaces for arthrodesis.  A drain was placed subfascially.   After hemostasis, the wound was closed in layers with 0 and 2-0 vicryl. 3-0 monocryl and dermabond  was applied to the incision.  The patient was then flipped supine and extubated with incident. All counts were correct times 2 at the end of the case. No immediate complications were noted.  Sheffield Berg PA student assisted in the entire procedure. I performed the critical portions of the procedure.   Reeves Daisy MD

## 2024-07-22 NOTE — Interval H&P Note (Signed)
 History and Physical Interval Note:  07/22/2024 6:56 AM  Keren D. 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  The various methods of treatment have been discussed with the patient and family. After consideration of risks, benefits and other options for treatment, the patient has consented to  Procedure(s) with comments: THORACIC DISCECTOMY (N/A) - T7-8 TRANSPEDICULAR DISCECTOMY POSTERIOR THORACIC FUSION 1 LEVEL (N/A) - T7-8 POSTERIOR SPINAL FUSION APPLICATION OF INTRAOPERATIVE CT SCAN (N/A) as a surgical intervention.  The patient's history has been reviewed, patient examined, no change in status, stable for surgery.  I have reviewed the patient's chart and labs.  Questions were answered to the patient's satisfaction.    Heart sounds normal no MRG. Chest Clear to Auscultation Bilaterally.   Ashleen Demma

## 2024-07-22 NOTE — Anesthesia Postprocedure Evaluation (Signed)
 Anesthesia Post Note  Patient: Kathleen Carr  Procedure(s) Performed: THORACIC DISCECTOMY (Spine Thoracic) POSTERIOR THORACIC FUSION 1 LEVEL (Spine Thoracic) APPLICATION OF INTRAOPERATIVE CT SCAN (Spine Thoracic)  Patient location during evaluation: PACU Anesthesia Type: General Level of consciousness: awake and alert Pain management: pain level controlled Vital Signs Assessment: post-procedure vital signs reviewed and stable Respiratory status: spontaneous breathing, nonlabored ventilation and respiratory function stable Cardiovascular status: blood pressure returned to baseline and stable Postop Assessment: no apparent nausea or vomiting Anesthetic complications: no   No notable events documented.   Last Vitals:  Vitals:   07/22/24 1210 07/22/24 1215  BP:    Pulse: 74 72  Resp: 17 13  Temp:    SpO2: 92% 93%    Last Pain:  Vitals:   07/22/24 1210  TempSrc:   PainSc: 6                  Fairy POUR Katriel Cutsforth

## 2024-07-22 NOTE — Plan of Care (Signed)
  Problem: Clinical Measurements: Goal: Will remain free from infection Outcome: Progressing Goal: Diagnostic test results will improve Outcome: Progressing Goal: Cardiovascular complication will be avoided Outcome: Progressing   Problem: Activity: Goal: Risk for activity intolerance will decrease Outcome: Progressing   

## 2024-07-22 NOTE — Anesthesia Preprocedure Evaluation (Signed)
 Anesthesia Evaluation  Patient identified by MRN, date of birth, ID band Patient awake    Reviewed: Allergy & Precautions, NPO status , Patient's Chart, lab work & pertinent test results  History of Anesthesia Complications Negative for: history of anesthetic complications  Airway Mallampati: III  TM Distance: >3 FB Neck ROM: full    Dental  (+) Chipped, Poor Dentition, Missing   Pulmonary neg shortness of breath, asthma , former smoker   Pulmonary exam normal        Cardiovascular Exercise Tolerance: Good hypertension, (-) angina (-) Past MI and (-) DOE Normal cardiovascular exam     Neuro/Psych TIA Neuromuscular disease CVA    GI/Hepatic Neg liver ROS,GERD  Controlled,,  Endo/Other  diabetes, Type 2Hypothyroidism    Renal/GU Renal disease     Musculoskeletal   Abdominal   Peds  Hematology negative hematology ROS (+)   Anesthesia Other Findings Past Medical History: No date: Allergy No date: Anxiety No date: Arthritis No date: Asthma No date: Bipolar 1 disorder (HCC) No date: Bipolar depression (HCC) No date: Carpal tunnel syndrome 01/14/2022: Cat bite No date: Chronic kidney disease No date: Chronic pain syndrome No date: Depression No date: Diabetes mellitus without complication (HCC) No date: GERD (gastroesophageal reflux disease) No date: Hypertension No date: Hypothyroidism No date: Insomnia, unspecified type 05/21/2022: Laceration of right thigh 01/14/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 No date: Pneumonia No date: Postlaminectomy syndrome, lumbar region No date: PTSD (post-traumatic stress disorder) No date: Smoker No date: Stroke [facility]) No date: Thoracic disc herniation No date: Thoracic spondylosis with myelopathy No date: Thyroid  disease 2024: TIA (transient ischemic attack)     Comment:  possible TIA per patient back in 2024  Past Surgical History: 12/03/2011: ABDOMINAL HYSTERECTOMY      Comment:  complete 1977: ADENOIDECTOMY, TONSILLECTOMY AND MYRINGOTOMY WITH TUBE PLACEMENT 03/12/2022: CARPAL TUNNEL RELEASE; Right     Comment:  Procedure: RIGHT CARPAL TUNNEL RELEASE;  Surgeon:               Romona Harari, MD;  Location: [facility];  Service: Orthopedics;  Laterality: Right; 04/16/2022: CARPAL TUNNEL RELEASE; Left     Comment:  Procedure: LEFT CARPAL TUNNEL RELEASE;  Surgeon:               Romona Harari, MD;  Location: [facility];  Service: Orthopedics;  Laterality: Left; 06/08/2011: CHOLECYSTECTOMY 02/05/2022: COLONOSCOPY WITH PROPOFOL ; N/A     Comment:  Procedure: COLONOSCOPY WITH PROPOFOL ;  Surgeon: Therisa Bi, MD;  Location: [facility] ENDOSCOPY;  Service:               Gastroenterology;  Laterality: N/A; 02/05/2022: ESOPHAGOGASTRODUODENOSCOPY; N/A     Comment:  Procedure: ESOPHAGOGASTRODUODENOSCOPY (EGD);  Surgeon:               Therisa Bi, MD;  Location: [facility] ENDOSCOPY;  Service:               Gastroenterology;  Laterality: N/A; 08/19/2022: HIP ARTHROSCOPY; Right     Comment:  Procedure: Right hip arthroscopy, acetabuloplasty,               labral repair, femoral osteochondroplasty, capsular               closure;  Surgeon: Tobie Priest, MD;  Location: [facility]S;              Service: Orthopedics;  Laterality: Right; 1993: KNEE ARTHROSCOPY; Right 1998: KNEE ARTHROSCOPY; Left 2021: LUMBAR LAMINECTOMY; Left 02/17/2024: THORACIC LAMINECTOMY FOR SPINAL CORD STIMULATOR; N/A     Comment:  Procedure: THORACIC LAMINECTOMY FOR SPINAL CORD               STIMULATOR;  Surgeon: Clois Fret, MD;  Location:               [facility] ORS;  Service: Neurosurgery;  Laterality: N/A;                THORACIC LAMINRCTOMY FOR SPINAL CORD STIMULATOR PLACEMENT 03/09/2023: TOTAL HIP ARTHROPLASTY; Right     Comment:  Procedure: TOTAL HIP ARTHROPLASTY ANTERIOR APPROACH;                Surgeon: Lorelle Hussar, MD;   Location: [facility];                Service: Orthopedics;  Laterality: Right;  BMI    Body Mass Index: 24.03 kg/m      Reproductive/Obstetrics negative OB ROS                              Anesthesia Physical Anesthesia Plan  ASA: 2  Anesthesia Plan: General ETT   Post-op Pain Management:    Induction: Intravenous  PONV Risk Score and Plan: Ondansetron , Dexamethasone , Midazolam  and Treatment may vary due to age or medical condition  Airway Management Planned: Oral ETT  Additional Equipment:   Intra-op Plan:   Post-operative Plan: Extubation in OR  Informed Consent: I have reviewed the patients History and Physical, chart, labs and discussed the procedure including the risks, benefits and alternatives for the proposed anesthesia with the patient or authorized representative who has indicated his/her understanding and acceptance.     Dental Advisory Given  Plan Discussed with: Anesthesiologist, CRNA and Surgeon  Anesthesia Plan Comments: (Patient consented for risks of anesthesia including but not limited to:  - adverse reactions to medications - damage to eyes, teeth, lips or other oral mucosa - nerve damage due to positioning  - sore throat or hoarseness - Damage to heart, brain, nerves, lungs, other parts of body or loss of life  Patient voiced understanding and assent.)        Anesthesia Quick Evaluation

## 2024-07-23 MED ORDER — HYDROMORPHONE HCL 1 MG/ML IJ SOLN
0.5000 mg | Freq: Once | INTRAMUSCULAR | Status: AC
  Administered 2024-07-23: 0.5 mg via INTRAVENOUS
  Filled 2024-07-23: qty 0.5

## 2024-07-23 NOTE — Plan of Care (Signed)
  Problem: Clinical Measurements: Goal: Diagnostic test results will improve Outcome: Progressing Goal: Cardiovascular complication will be avoided Outcome: Progressing   Problem: Activity: Goal: Risk for activity intolerance will decrease Outcome: Progressing   Problem: Nutrition: Goal: Adequate nutrition will be maintained Outcome: Progressing

## 2024-07-23 NOTE — Evaluation (Signed)
 Physical Therapy Evaluation & Discharge Patient Details Name: Kathleen Carr MRN: [id number] DOB: 1971-04-27 Today's Date: 07/23/2024  History of Present Illness  Pt is a 53 y/o F admitted on 07/19/24 for T7-8 transpedicular discectomy, posterior fusion. PMH: R THR, bipolar disorder, DM, GERD, HTN, PTSD  Clinical Impression  Pt seen for PT evaluation with pt agreeable to tx. Pt reports prior to admission she was independent without AD. On this date, pt is able to complete transfers with independence, ambulate around unit without AD, & negotiate stairs with 1 rail with mod I. At this time, pt does not require acute PT services. PT to complete current orders. Please re-consult if new needs arise.        If plan is discharge home, recommend the following:     Can travel by private vehicle        Equipment Recommendations None recommended by PT  Recommendations for Other Services       Functional Status Assessment Patient has not had a recent decline in their functional status     Precautions / Restrictions Precautions Precautions: Back Precaution/Restrictions Comments: no brace Restrictions Weight Bearing Restrictions Per Provider Order: No      Mobility  Bed Mobility               General bed mobility comments: not tested, pt received sitting EOB    Transfers Overall transfer level: Independent Equipment used: None                    Ambulation/Gait Ambulation/Gait assistance: Independent Gait Distance (Feet): 200 Feet Assistive device: None Gait Pattern/deviations: Step-through pattern          Stairs Stairs: Yes Stairs assistance: Modified independent (Device/Increase time) Stair Management:  (R descending rail/L ascending rail) Number of Stairs: 7 (6)    Wheelchair Mobility     Tilt Bed    Modified Rankin (Stroke Patients Only)       Balance Overall balance assessment: Modified Independent                                            Pertinent Vitals/Pain Pain Assessment Pain Assessment: No/denies pain    Home Living Family/patient expects to be discharged to:: Private residence Living Arrangements: Spouse/significant other Available Help at Discharge: Family;Available 24 hours/day Type of Home: House Home Access: Stairs to enter Entrance Stairs-Rails: Right;Left;Can reach both Entrance Stairs-Number of Steps: 4   Home Layout: One level Home Equipment: Agricultural consultant (2 wheels);BSC/3in1;Adaptive equipment      Prior Function Prior Level of Function : Independent/Modified Independent             Mobility Comments: reports she does not work (on disability), ambulatory without AD       Extremity/Trunk Assessment   Upper Extremity Assessment Upper Extremity Assessment: Overall WFL for tasks assessed    Lower Extremity Assessment Lower Extremity Assessment: Overall WFL for tasks assessed    Cervical / Trunk Assessment Cervical / Trunk Assessment: Back Surgery  Communication   Communication Communication: No apparent difficulties    Cognition Arousal: Alert Behavior During Therapy: WFL for tasks assessed/performed   PT - Cognitive impairments: No apparent impairments                         Following commands: Intact  Cueing Cueing Techniques: Verbal cues     General Comments General comments (skin integrity, edema, etc.): reviewed back precautions    Exercises     Assessment/Plan    PT Assessment Patient does not need any further PT services  PT Problem List         PT Treatment Interventions      PT Goals (Current goals can be found in the Care Plan section)  Acute Rehab PT Goals Patient Stated Goal: go home PT Goal Formulation: All assessment and education complete, DC therapy Time For Goal Achievement: 08/06/24 Potential to Achieve Goals: Good    Frequency       Co-evaluation               AM-PAC PT 6 Clicks Mobility   Outcome Measure Help needed turning from your back to your side while in a flat bed without using bedrails?: None Help needed moving from lying on your back to sitting on the side of a flat bed without using bedrails?: None Help needed moving to and from a bed to a chair (including a wheelchair)?: None Help needed standing up from a chair using your arms (e.g., wheelchair or bedside chair)?: None Help needed to walk in hospital room?: None Help needed climbing 3-5 steps with a railing? : None 6 Click Score: 24    End of Session   Activity Tolerance: Patient tolerated treatment well Patient left:  (standing in room) Nurse Communication: Mobility status      Time: 1250-1259 PT Time Calculation (min) (ACUTE ONLY): 9 min   Charges:   PT Evaluation $PT Eval Low Complexity: 1 Low   PT General Charges $$ ACUTE PT VISIT: 1 Visit         Richerd Pinal, PT, DPT 07/23/24, 2:02 PM   Richerd CHRISTELLA Pinal 07/23/2024, 2:00 PM

## 2024-07-23 NOTE — Evaluation (Signed)
 Occupational Therapy Evaluation Patient Details Name: Kathleen Carr MRN: [id number] DOB: 26-Jan-1971 Today's Date: 07/23/2024   History of Present Illness   53 y.o. female who is s/p t7-8 discectomy and fusion for thoracic myelopathy.     Clinical Impressions Upon entering the room, pt supine in bed and agreeable to OT intervention. Pt reports living at home with spouse and being Ind at baseline with self care, IADLs, and mobility. Pt educated on back precautions which she is already familiar with from prior surgery. Pt demonstrates figure four position to don B socks without assistance and ambulates without use of AD or LOB. Pt demonstrates ability to perform toilet transfer and then grooming tasks while standing at sink independently. Pt with no skilled acute OT need at this time. OT to complete orders. Pt agrees.      Functional Status Assessment   Patient has not had a recent decline in their functional status     Equipment Recommendations   None recommended by OT      Precautions/Restrictions   Precautions Precautions: Back Precaution/Restrictions Comments: no brace     Mobility Bed Mobility Overal bed mobility: Modified Independent                  Transfers Overall transfer level: Modified independent Equipment used: None                      Balance Overall balance assessment: Modified Independent                                         ADL either performed or assessed with clinical judgement   ADL Overall ADL's : Modified independent                                             Vision Patient Visual Report: No change from baseline              Pertinent Vitals/Pain Pain Assessment Pain Assessment: No/denies pain     Extremity/Trunk Assessment Upper Extremity Assessment Upper Extremity Assessment: Overall WFL for tasks assessed   Lower Extremity Assessment Lower Extremity Assessment:  Overall WFL for tasks assessed       Communication Communication Communication: No apparent difficulties   Cognition Arousal: Alert Behavior During Therapy: WFL for tasks assessed/performed Cognition: No apparent impairments                               Following commands: Intact                  Home Living Family/patient expects to be discharged to:: Private residence Living Arrangements: Spouse/significant other Available Help at Discharge: Family;Available 24 hours/day Type of Home: House Home Access: Stairs to enter [employer] of Steps: 4 Entrance Stairs-Rails: Right;Left Home Layout: One level     Bathroom Shower/Tub: Chief Strategy Officer: Standard     Home Equipment: Agricultural consultant (2 wheels);BSC/3in1;Adaptive equipment Adaptive Equipment: Reacher        Prior Functioning/Environment Prior Level of Function : Independent/Modified Independent  OT Goals(Current goals can be found in the care plan section)   Acute Rehab OT Goals Patient Stated Goal: to go home OT Goal Formulation: With patient Time For Goal Achievement: 07/23/24 Potential to Achieve Goals: Fair   AM-PAC OT 6 Clicks Daily Activity     Outcome Measure Help from another person eating meals?: None Help from another person taking care of personal grooming?: None Help from another person toileting, which includes using toliet, bedpan, or urinal?: None Help from another person bathing (including washing, rinsing, drying)?: None Help from another person to put on and taking off regular upper body clothing?: None Help from another person to put on and taking off regular lower body clothing?: None 6 Click Score: 24   End of Session Nurse Communication: Mobility status  Activity Tolerance: Patient tolerated treatment well Patient left: in bed;with family/visitor present                   Time: 1250-1259 OT  Time Calculation (min): 9 min Charges:  OT General Charges $OT Visit: 1 Visit OT Evaluation $OT Eval Low Complexity: 1 Low  Izetta Claude, MS, OTR/L , CBIS ascom [phone]  07/23/24, 1:56 PM

## 2024-07-23 NOTE — Progress Notes (Signed)
 History of Present Illness: Kathleen Carr is a 53 y.o. female who is s/p t7-8 discectomy and fusion for thoracic myelopathy.  POD 1:  Patient feels legs are stronger    The symptoms are causing a significant impact on the patient's life.   Review of Systems:  A 10 point review of systems is negative, except for the pertinent positives and negatives detailed in the HPI.  Past Medical History: Past Medical History:  Diagnosis Date   Allergy    Anxiety    Arthritis    Asthma    Bipolar 1 disorder (HCC)    Bipolar depression (HCC)    Carpal tunnel syndrome    Cat bite 01/14/2022   Chronic kidney disease    Chronic pain syndrome    Depression    Diabetes mellitus without complication (HCC)    GERD (gastroesophageal reflux disease)    Hypertension    Hypothyroidism    Insomnia, unspecified type    Laceration of right thigh 05/21/2022   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   Pneumonia    Postlaminectomy syndrome, lumbar region    PTSD (post-traumatic stress disorder)    Smoker    Stroke [facility])    Thoracic disc herniation    Thoracic spondylosis with myelopathy    Thyroid  disease    TIA (transient ischemic attack) 2024   possible TIA per patient back in 2024    Past Surgical History: Past Surgical History:  Procedure Laterality Date   ABDOMINAL HYSTERECTOMY  12/03/2011   complete   ADENOIDECTOMY, TONSILLECTOMY AND MYRINGOTOMY WITH TUBE PLACEMENT  1977   CARPAL TUNNEL RELEASE Right 03/12/2022   Procedure: RIGHT CARPAL TUNNEL RELEASE;  Surgeon: Romona Harari, MD;  Location: [facility];  Service: Orthopedics;  Laterality: Right;   CARPAL TUNNEL RELEASE Left 04/16/2022   Procedure: LEFT CARPAL TUNNEL RELEASE;  Surgeon: Romona Harari, MD;  Location: [facility];  Service: Orthopedics;  Laterality: Left;   CHOLECYSTECTOMY  06/08/2011   COLONOSCOPY WITH PROPOFOL  N/A 02/05/2022   Procedure: COLONOSCOPY WITH PROPOFOL ;  Surgeon: Therisa Bi, MD;  Location: [facility] END[facility]COPY;  Service: Gastroenterology;  Laterality: N/A;   ESOPHAGOGASTRODUODENOSCOPY N/A 02/05/2022   Procedure: ESOPHAGOGASTRODUODENOSCOPY (EGD);  Surgeon: Therisa Bi, MD;  Location: [facility] END[facility]COPY;  Service: Gastroenterology;  Laterality: N/A;   HIP ARTHROSCOPY Right 08/19/2022   Procedure: Right hip arthroscopy, acetabuloplasty, labral repair, femoral osteochondroplasty, capsular closure;  Surgeon: Tobie Priest, MD;  Location: [facility];  Service: Orthopedics;  Laterality: Right;   KNEE ARTHROSCOPY Right 1993   KNEE ARTHROSCOPY Left 1998   LUMBAR LAMINECTOMY Left 2021   THORACIC LAMINECTOMY FOR SPINAL CORD STIMULATOR N/A 02/17/2024   Procedure: THORACIC LAMINECTOMY FOR SPINAL CORD STIMULATOR;  Surgeon: Clois Fret, MD;  Location: [facility];  Service: Neurosurgery;  Laterality: N/A;  THORACIC LAMINRCTOMY FOR SPINAL CORD STIMULATOR PLACEMENT   TOTAL HIP ARTHROPLASTY Right 03/09/2023   Procedure: TOTAL HIP ARTHROPLASTY ANTERIOR APPROACH;  Surgeon: Lorelle Hussar, MD;  Location: [facility];  Service: Orthopedics;  Laterality: Right;    Problem List: Patient Active Problem List   Diagnosis Date Noted   S/P spinal fusion 07/22/2024   Thoracic spondylosis with myelopathy 07/22/2024   Thoracic disc herniation 07/22/2024   Chronic pain syndrome 02/17/2024   Postlaminectomy syndrome of lumbar region 02/17/2024   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 09/20/2023   History of acute renal failure 09/20/2023   Restless legs syndrome 07/08/2023   GERD without esophagitis 04/23/2023  Depression 04/23/2023   Type 2 diabetes mellitus with obesity 04/23/2023   Hospital discharge follow-up 03/18/2023   Status post THR (total hip replacement) 03/09/2023   Polyarthritis 12/31/2022   Femoroacetabular impingement of right hip 08/19/2022   S/P TAH (total abdominal hysterectomy) 06/27/2022   Trigger finger, left middle finger 06/20/2022   Carpal tunnel  syndrome on both sides    Smoker 04/02/2022   Tobacco abuse 04/02/2022   Long-term use of high-risk medication 04/02/2022   Situational anxiety 11/21/2021   Vitamin D  deficiency 11/21/2021   Hypothyroidism 11/21/2021   Carpal tunnel syndrome, right upper limb 11/02/2021   Bipolar disorder, currently in remission 07/21/2021   Gastritis 03/06/2021   Preoperative evaluation to rule out surgical contraindication 02/04/2021   Leukocytosis 01/21/2021   Insomnia 01/21/2021   Chronic midline low back pain 08/05/2020   Morbid obesity (HCC) 08/05/2020    Allergies: Allergies as of 07/19/2024 - Review Complete 07/19/2024  Allergen Reaction Noted   Dust mite mixed allergen ext [mite (d. farinae)]  08/01/2020   Nsaids  12/31/2022   Other Hives 08/01/2020   Sulfa antibiotics Hives 06/24/2020   Latex Itching 08/12/2022   Misc. sulfonamide containing compounds Hives 05/05/2022   Wound dressing adhesive Rash 02/05/2024    Medications: @ENCMED @  Social History: Social History   Tobacco Use   Smoking status: Former    Current packs/day: 0.00    Average packs/day: 0.5 packs/day for 42.0 years (21.0 ttl pk-yrs)    Types: Cigarettes    Start date: 01/25/1981    Quit date: 01/26/2023    Years since quitting: 1.4   Smokeless tobacco: Never  Vaping Use   Vaping status: Former   Substances: Nicotine   Substance Use Topics   Alcohol use: Not Currently   Drug use: Not Currently    Family Medical History: Family History  Problem Relation Age of Onset   Hypertension Mother    Thyroid  disease Mother    Lung cancer Father 28       carcinoid, right lung   Ulcerative colitis Father    Breast cancer Paternal Aunt        x3 aunts   Glaucoma Maternal Grandmother    Renal Disease Maternal Grandfather    Diabetes Maternal Grandfather        Type2   Stroke Paternal Grandfather    Hypertension Paternal Grandfather     Physical Examination: @VITALWITHPAIN @  General: Patient is well  developed, well nourished, calm, collected, and in no apparent distress.  Psychiatric: Patient is non-anxious.  Head:  Pupils equal, round, and reactive to light.  ENT:  Oral mucosa appears well hydrated.  Neck:   Supple.  Full range of motion.  Respiratory: Patient is breathing without any difficulty.  Extremities: No edema.  Vascular: Palpable pulses.  Skin:   On exposed skin, there are no abnormal skin lesions.  NEUROLOGICAL:  General: In no acute distress.   Awake, alert, oriented to person, place, and time.  Pupils equal round and reactive to light.  Facial tone is symmetric.  Tongue protrusion is midline.  There is no pronator drift.    Strength: Side Biceps Triceps Deltoid Interossei Grip Wrist Ext. Wrist Flex.  R 5 5 5 5 5 5 5   L 5 5 5 5 5 5 5    Side Iliopsoas Quads Hamstring PF DF EHL  R 5 5 5 5 5 5   L 5 5 5 5 5 5     Drains:  115 mL   Assessment  and Plan: Kathleen Carr is a pleasant 53 y.o. female s/p thoracic discectomy and fusion.  Doing very well. OT/PT Continue drain for now.   Lovenox 

## 2024-07-23 NOTE — TOC CAGE-AID Note (Signed)
 Transition of Care (TOC) - CAGE-AID Screening   Patient Details  Name: Kathleen Carr MRN: [id number] Date of Birth: 11-06-1970  Transition of Care [facility]) CM/SW Contact:    Edsel DELENA Fischer, LCSW Phone Number: 07/23/2024, 5:16 PM   Clinical Narrative:  TOC reached out to pt regarding HH options. Pt was asleep. TOC to follow up another time  CAGE-AID Screening:

## 2024-07-24 MED ORDER — PNEUMOCOCCAL 20-VAL CONJ VACC 0.5 ML IM SUSY
0.5000 mL | PREFILLED_SYRINGE | INTRAMUSCULAR | Status: AC
  Administered 2024-07-25: 0.5 mL via INTRAMUSCULAR
  Filled 2024-07-24: qty 0.5

## 2024-07-24 MED ORDER — ALPRAZOLAM 0.25 MG PO TABS
0.2500 mg | ORAL_TABLET | Freq: Every evening | ORAL | Status: DC | PRN
  Administered 2024-07-24: 0.25 mg via ORAL
  Filled 2024-07-24: qty 1

## 2024-07-24 MED ORDER — INFLUENZA VIRUS VACC SPLIT PF (FLUZONE) 0.5 ML IM SUSY
0.5000 mL | PREFILLED_SYRINGE | INTRAMUSCULAR | Status: AC
  Administered 2024-07-25: 0.5 mL via INTRAMUSCULAR
  Filled 2024-07-24: qty 0.5

## 2024-07-24 MED ORDER — DOCUSATE SODIUM 100 MG PO CAPS
100.0000 mg | ORAL_CAPSULE | Freq: Every day | ORAL | Status: DC
  Administered 2024-07-25: 100 mg via ORAL
  Filled 2024-07-24: qty 1

## 2024-07-24 MED ORDER — ALBUTEROL SULFATE (2.5 MG/3ML) 0.083% IN NEBU: 2.5000 mg | INHALATION_SOLUTION | Freq: Four times a day (QID) | RESPIRATORY_TRACT | Status: DC | PRN

## 2024-07-24 MED ORDER — ATORVASTATIN CALCIUM 20 MG PO TABS
20.0000 mg | ORAL_TABLET | Freq: Every day | ORAL | Status: DC
  Administered 2024-07-24: 20 mg via ORAL
  Filled 2024-07-24: qty 1

## 2024-07-24 NOTE — Progress Notes (Signed)
 History of Present Illness: Tram D. Browning is a 53 y.o. female who is s/p t7-8 discectomy and fusion for thoracic myelopathy.   POD 2:  Had some pain yesterday, but doing better POD 1:  Patient feels legs are stronger       The symptoms are causing a significant impact on the patient's life.    Review of Systems:  A 10 point review of systems is negative, except for the pertinent positives and negatives detailed in the HPI.   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   Pneumonia     Postlaminectomy syndrome, lumbar region     PTSD (post-traumatic stress disorder)     Smoker     Stroke [facility])     Thoracic disc herniation     Thoracic spondylosis with myelopathy     Thyroid  disease     TIA (transient ischemic attack) 2024    possible TIA per patient back in 2024          Past Surgical History:      Past Surgical History:  Procedure Laterality Date   ABDOMINAL HYSTERECTOMY   12/03/2011    complete   ADENOIDECTOMY, TONSILLECTOMY AND MYRINGOTOMY WITH TUBE PLACEMENT   1977   CARPAL TUNNEL RELEASE Right 03/12/2022    Procedure: RIGHT CARPAL TUNNEL RELEASE;  Surgeon: Romona Harari, MD;  Location: [facility];  Service: Orthopedics;  Laterality: Right;   CARPAL TUNNEL RELEASE Left 04/16/2022    Procedure: LEFT CARPAL TUNNEL RELEASE;  Surgeon: Romona Harari, MD;  Location: [facility];  Service: Orthopedics;  Laterality: Left;   CHOLECYSTECTOMY   06/08/2011    COLONOSCOPY WITH PROPOFOL  N/A 02/05/2022    Procedure: COLONOSCOPY WITH PROPOFOL ;  Surgeon: Therisa Bi, MD;  Location: [facility] END[facility]COPY;  Service: Gastroenterology;  Laterality: N/A;   ESOPHAGOGASTRODUODENOSCOPY N/A 02/05/2022    Procedure: ESOPHAGOGASTRODUODENOSCOPY (EGD);  Surgeon: Therisa Bi, MD;  Location: [facility] ENDOSCOPY;  Service: Gastroenterology;  Laterality: N/A;   HIP ARTHROSCOPY Right 08/19/2022    Procedure: Right hip arthroscopy, acetabuloplasty, labral repair, femoral osteochondroplasty, capsular closure;  Surgeon: Tobie Priest, MD;  Location: [facility]S;  Service: Orthopedics;  Laterality: Right;   KNEE ARTHROSCOPY Right 1993   KNEE ARTHROSCOPY Left 1998   LUMBAR LAMINECTOMY Left 2021   THORACIC LAMINECTOMY FOR SPINAL CORD STIMULATOR N/A 02/17/2024    Procedure: THORACIC LAMINECTOMY FOR SPINAL CORD STIMULATOR;  Surgeon: Clois Fret, MD;  Location: [facility];  Service: Neurosurgery;  Laterality: N/A;  THORACIC LAMINRCTOMY FOR SPINAL CORD STIMULATOR PLACEMENT   TOTAL HIP ARTHROPLASTY Right 03/09/2023    Procedure: TOTAL HIP ARTHROPLASTY ANTERIOR APPROACH;  Surgeon: Lorelle Hussar, MD;  Location: [facility];  Service: Orthopedics;  Laterality: Right;          Problem List:     Patient Active Problem  List    Diagnosis Date Noted   S/P spinal fusion 07/22/2024   Thoracic spondylosis with myelopathy 07/22/2024   Thoracic disc herniation 07/22/2024   Chronic pain syndrome 02/17/2024   Postlaminectomy syndrome of lumbar region 02/17/2024   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 09/20/2023   History of acute renal failure 09/20/2023   Restless legs syndrome 07/08/2023   GERD without esophagitis 04/23/2023   Depression 04/23/2023   Type 2 diabetes mellitus with obesity 04/23/2023   Hospital discharge follow-up 03/18/2023   Status post THR (total hip replacement) 03/09/2023   Polyarthritis 12/31/2022   Femoroacetabular impingement of right hip 08/19/2022    S/P TAH (total abdominal hysterectomy) 06/27/2022   Trigger finger, left middle finger 06/20/2022   Carpal tunnel syndrome on both sides     Smoker 04/02/2022   Tobacco abuse 04/02/2022   Long-term use of high-risk medication 04/02/2022   Situational anxiety 11/21/2021   Vitamin D  deficiency 11/21/2021   Hypothyroidism 11/21/2021   Carpal tunnel syndrome, right upper limb 11/02/2021   Bipolar disorder, currently in remission 07/21/2021   Gastritis 03/06/2021   Preoperative evaluation to rule out surgical contraindication 02/04/2021   Leukocytosis 01/21/2021   Insomnia 01/21/2021   Chronic midline low back pain 08/05/2020   Morbid obesity (HCC) 08/05/2020      Allergies:      Allergies as of 07/19/2024 - Review Complete 07/19/2024  Allergen Reaction Noted   Dust mite mixed allergen ext [mite (d. farinae)]   08/01/2020   Nsaids   12/31/2022   Other Hives 08/01/2020   Sulfa antibiotics Hives 06/24/2020   Latex Itching 08/12/2022   Misc. sulfonamide containing compounds Hives 05/05/2022   Wound dressing adhesive Rash 02/05/2024      Medications: @ENCMED @   Social History: Social History  Social History         Tobacco Use   Smoking status: Former      Current packs/day: 0.00      Average packs/day: 0.5 packs/day for 42.0 years (21.0 ttl pk-yrs)      Types: Cigarettes      Start date: 01/25/1981      Quit date: 01/26/2023      Years since quitting: 1.4   Smokeless tobacco: Never  Vaping Use   Vaping status: Former   Substances: Nicotine   Substance Use Topics   Alcohol use: Not Currently   Drug use: Not Currently        Family Medical History:      Family History  Problem Relation Age of Onset   Hypertension Mother     Thyroid  disease Mother     Lung cancer Father 20        carcinoid, right lung   Ulcerative colitis Father     Breast cancer Paternal Aunt          x3 aunts   Glaucoma Maternal Grandmother     Renal Disease Maternal Grandfather      Diabetes Maternal Grandfather          Type2   Stroke Paternal Grandfather     Hypertension Paternal Grandfather            Physical Examination: @VITALWITHPAIN @   General:          Patient is well developed, well nourished, calm, collected, and in no apparent distress.   Psychiatric:      Patient is non-anxious.   Head:  Pupils equal, round, and reactive to light.   ENT:                Oral mucosa appears well hydrated.   Neck:               Supple.  Full range of motion.   Respiratory:     Patient is breathing without any difficulty.   Extremities:     No edema.   Vascular:         Palpable pulses.   Skin:                On exposed skin, there are no abnormal skin lesions.   NEUROLOGICAL:  General: In no acute distress.   Awake, alert, oriented to person, place, and time.  Pupils equal round and reactive to light.  Facial tone is symmetric.  Tongue protrusion is midline.  There is no pronator drift.       Strength: Side Biceps Triceps Deltoid Interossei Grip Wrist Ext. Wrist Flex.  R 5 5 5 5 5 5 5   L 5 5 5 5 5 5 5     Side Iliopsoas Quads Hamstring PF DF EHL  R 5 5 5 5 5 5   L 5 5 5 5 5 5       Drains:  150 mL     Assessment and Plan: Ms. Carel is a pleasant 53 y.o. female s/p thoracic discectomy and fusion.   Doing very well. OT/PT Continue drain for now.   Lovenox 

## 2024-07-24 NOTE — Plan of Care (Signed)
  Problem: Clinical Measurements: Goal: Cardiovascular complication will be avoided Outcome: Progressing   Problem: Activity: Goal: Risk for activity intolerance will decrease Outcome: Progressing   Problem: Nutrition: Goal: Adequate nutrition will be maintained Outcome: Progressing   Problem: Coping: Goal: Level of anxiety will decrease Outcome: Progressing   

## 2024-07-25 ENCOUNTER — Other Ambulatory Visit: Payer: Self-pay

## 2024-07-25 ENCOUNTER — Encounter: Payer: Self-pay | Admitting: Neurosurgery

## 2024-07-25 ENCOUNTER — Other Ambulatory Visit: Payer: Self-pay | Admitting: Neurosurgery

## 2024-07-25 MED ORDER — OXYCODONE HCL 5 MG PO TABS
5.0000 mg | ORAL_TABLET | ORAL | 0 refills | Status: DC | PRN
  Filled 2024-07-25: qty 45, 3d supply, fill #0

## 2024-07-25 MED ORDER — POLYETHYLENE GLYCOL 3350 17 GM/SCOOP PO POWD
17.0000 g | Freq: Every day | ORAL | 0 refills | Status: AC | PRN
  Filled 2024-07-25: qty 238, 14d supply, fill #0

## 2024-07-25 MED ORDER — SERTRALINE HCL 50 MG PO TABS
100.0000 mg | ORAL_TABLET | Freq: Every day | ORAL | Status: DC
  Administered 2024-07-25: 100 mg via ORAL
  Filled 2024-07-25: qty 2

## 2024-07-25 MED ORDER — ROPINIROLE HCL 0.25 MG PO TABS
0.2500 mg | ORAL_TABLET | Freq: Three times a day (TID) | ORAL | Status: DC
  Administered 2024-07-25: 0.25 mg via ORAL
  Filled 2024-07-25 (×2): qty 1

## 2024-07-25 MED ORDER — QUETIAPINE FUMARATE 25 MG PO TABS
100.0000 mg | ORAL_TABLET | Freq: Two times a day (BID) | ORAL | Status: DC
  Administered 2024-07-25: 100 mg via ORAL
  Filled 2024-07-25: qty 4

## 2024-07-25 MED ORDER — OXYBUTYNIN CHLORIDE ER 5 MG PO TB24: 5.0000 mg | ORAL_TABLET | Freq: Every day | ORAL | Status: DC

## 2024-07-25 MED ORDER — LEVOTHYROXINE SODIUM 50 MCG PO TABS
50.0000 ug | ORAL_TABLET | Freq: Every day | ORAL | Status: DC
  Administered 2024-07-25: 50 ug via ORAL
  Filled 2024-07-25: qty 1

## 2024-07-25 MED ORDER — SENNA 8.6 MG PO TABS
1.0000 | ORAL_TABLET | Freq: Two times a day (BID) | ORAL | 0 refills | Status: AC
  Filled 2024-07-25: qty 120, 60d supply, fill #0

## 2024-07-25 MED ORDER — METHOCARBAMOL 500 MG PO TABS
500.0000 mg | ORAL_TABLET | Freq: Four times a day (QID) | ORAL | 0 refills | Status: AC | PRN
  Filled 2024-07-25: qty 120, 30d supply, fill #0

## 2024-07-25 NOTE — Progress Notes (Signed)
 Attending Progress Note  History: Kathleen Carr is here s/p Discectomy and posterior fusion of T7-8 , they are currently 3 Days Post-Op.   POD 3: Pt is eager to d/c home this afternoon POD 2:  Had some pain yesterday, but doing better POD 1:  Patient feels legs are stronger    Physical Exam: Vitals:   07/24/24 2100 07/25/24 0437  BP: (!) 88/64 94/62  Pulse: 68 69  Resp: 16 16  Temp: 98 F (36.7 C) 98.9 F (37.2 C)  SpO2: 98% 96%    Drowsy but arouses  Strength:5/5 bilaterally throughout  Drain: 60mL total Incision: C/D/I  Data:  Recent Labs  Lab 07/20/24 1337  NA 137  K 4.1  CL 103  CO2 22  BUN 13  CREATININE 0.90  GLUCOSE 74  CALCIUM  9.3   No results for input(s): AST, ALT, ALKPHOS in the last 168 hours.  Invalid input(s): TBILI   Recent Labs  Lab 07/20/24 1337  WBC 10.1  HGB 12.4  HCT 36.8  PLT 389   No results for input(s): APTT, INR in the last 168 hours.      No results found.  Other tests/results: NA  Assessment/Plan:  Kathleen Carr is a 53y/o female s/p thoracic discectomy and fusion.  - Will remove HV drain today.  - mobilize - pain control - DVT prophylaxis - PTOT  Edsel Goods PA-C Department of Neurosurgery

## 2024-07-25 NOTE — Progress Notes (Addendum)
 Discharge education completed to family and IV removed without complications. Patient didn't wake up during entire process. Family assisting patient to get dressed. Writer will look at patients level of consciousness again prior to discharge.  Bee Hammerschmidt V Kajal Scalici   1336 Patient much more awake. Sitting upright on edge of bed. Wheeled to discharge lounge to await delivery of meds to bed.  Sherrill Mckamie V Symia Herdt

## 2024-07-25 NOTE — Discharge Summary (Signed)
 Discharge Summary  Patient ID: Kathleen Carr MRN: [id number] DOB/AGE: 1971/07/01 53 y.o.  Admit date: 07/22/2024 Discharge date: 07/25/2024  Admission Diagnoses: M47.14 Thoracic spondylosis with myelopathy, M51.24 Thoracic disc herniation  Discharge Diagnoses:  Principal Problem:   S/P spinal fusion Active Problems:   Thoracic spondylosis with myelopathy   Thoracic disc herniation   Discharged Condition: good  Hospital Course:  Kathleen Carr is a 53 y.o presenting with thoracic myelopathy s/p T7-8 transpedicular discectomy and T7-8 PSF for decompression. Her intraoperative course was uncomplicated. She was admitted for pain control, therapy evaluation, and drain output monitoring. She was seen and evaluated by therapy. Therapy signed off on POD1 not recommending further needs. Her drain output taper off and was removed on POD3. She reported significant back pain but overall felt it was managed with oral pain medications. She was discussion home on POD3 with medications as needed for pain  Consults: None  Significant Diagnostic Studies: NA  Treatments: surgery: as above. Please see separately dictated operative report for further details   Discharge Exam: Blood pressure (!) 101/55, pulse 65, temperature 98.2 F (36.8 C), resp. rate 14, height 5' 4 (1.626 m), weight 63.5 kg, SpO2 94%. Drowsy but arouses to voice Incision covered with post-op dressing 5/5 throughout BLE except 4+/5 bilateral HF  Disposition: Discharge disposition: 06-Home-Health Care Svc       Discharge Instructions     Incentive spirometry RT   Complete by: As directed    Remove dressing in 24 hours   Complete by: As directed       Allergies as of 07/25/2024       Reactions   Dust Mite Mixed Allergen Ext [mite (d. Farinae)]    Respiratory distresss   Nsaids    Acute renal failure   Other Hives   Allergy to [place], walnut and Birch trees and all grasses and allergic to Rabbits- patient reports  anaphylactic    Sulfa Antibiotics Hives   Latex Itching   Misc. Sulfonamide Containing Compounds Hives   Wound Dressing Adhesive Rash        Medication List     TAKE these medications    albuterol  108 (90 Base) MCG/ACT inhaler Commonly known as: VENTOLIN  HFA Inhale 1-2 puffs into the lungs every 6 (six) hours as needed for wheezing or shortness of breath.   ALPRAZolam  0.25 MG tablet Commonly known as: XANAX  Take 1 tablet (0.25 mg total) by mouth at bedtime as needed for anxiety.   atorvastatin  20 MG tablet Commonly known as: LIPITOR TAKE 1 TABLET BY MOUTH EVERY DAY   Colace 100 MG capsule Generic drug: docusate sodium  Take 100 mg by mouth daily.   cyanocobalamin  1000 MCG/ML injection Commonly known as: VITAMIN B12 INJECT 1 ML (1,000 MCG TOTAL) INTO THE MUSCLE EVERY 30 DAYS.   levothyroxine  50 MCG tablet Commonly known as: SYNTHROID  TAKE 1 TABLET BY MOUTH DAILY BEFORE BREAKFAST   loratadine  10 MG tablet Commonly known as: CLARITIN  Take 10 mg by mouth daily as needed for allergies.   Magnesium  Glycinate 120 MG Caps Take 120 mg by mouth at bedtime.   metFORMIN  1000 MG tablet Commonly known as: GLUCOPHAGE  TAKE 1 TABLET BY MOUTH TWICE A DAY WITH FOOD   methocarbamol  500 MG tablet Commonly known as: ROBAXIN  Take 1 tablet (500 mg total) by mouth every 6 (six) hours as needed for muscle spasms.   ondansetron  4 MG tablet Commonly known as: ZOFRAN  Take 1 tablet (4 mg total) by mouth every 6 (six)  hours as needed for nausea.   oxybutynin  15 MG 24 hr tablet Commonly known as: DITROPAN  XL TAKE 1 TABLET BY MOUTH EVERYDAY AT BEDTIME   oxyCODONE  5 MG immediate release tablet Commonly known as: Oxy IR/ROXICODONE  Take 1-2 tablets (5-10 mg total) by mouth every 3 (three) hours as needed for up to 5 days for moderate pain (pain score 4-6) or severe pain (pain score 7-10).   pantoprazole  40 MG tablet Commonly known as: PROTONIX  TAKE 1 TABLET BY MOUTH EVERY DAY    polyethylene glycol 17 g packet Commonly known as: MIRALAX  / GLYCOLAX  Take 17 g by mouth daily as needed for mild constipation.   QUEtiapine  300 MG tablet Commonly known as: SEROQUEL  TAKE 1 TABLET BY MOUTH EVERYDAY AT BEDTIME   rOPINIRole  0.25 MG tablet Commonly known as: REQUIP  TAKE 1 TABLET BY MOUTH THREE TIMES A DAY   senna 8.6 MG Tabs tablet Commonly known as: SENOKOT Take 1 tablet (8.6 mg total) by mouth 2 (two) times daily.   sertraline  100 MG tablet Commonly known as: ZOLOFT  Take 1 tablet (100 mg total) by mouth daily.   SYRINGE 3CC/25GX1 25G X 1 3 ML Misc 1 Syringe by Does not apply route every 30 (thirty) days.   tirzepatide  5 MG/0.5ML Pen Commonly known as: MOUNJARO  Inject 5 mg into the skin once a week.         Signed: Edsel Jama Goods 07/25/2024, 12:34 PM

## 2024-07-25 NOTE — TOC Transition Note (Signed)
 Transition of Care (TOC) - Discharge Note   Patient Details  Name: Corby D. Abeyta MRN: [id number] Date of Birth: 04/08/1971  Transition of Care [facility]) CM/SW Contact:  Corean ONEIDA Haddock, RN Phone Number: 07/25/2024, 1:32 PM   Clinical Narrative:      Patient to discharge today.  Therapy does not recommend any follow up or DME Per PA no indication for home health services at discharge        Patient Goals and CMS Choice            Discharge Placement                       Discharge Plan and Services Additional resources added to the After Visit Summary for                                       Social Drivers of Health (SDOH) Interventions SDOH Screenings   Food Insecurity: No Food Insecurity (07/22/2024)  Housing: Low Risk  (07/22/2024)  Transportation Needs: No Transportation Needs (07/22/2024)  Utilities: Not At Risk (07/22/2024)  Alcohol Screen: Low Risk  (01/21/2021)  Depression (PHQ2-9): Medium Risk (02/02/2024)  Financial Resource Strain: Low Risk  (01/31/2024)  Physical Activity: Unknown (01/31/2024)  Social Connections: Moderately Isolated (01/31/2024)  Stress: No Stress Concern Present (01/31/2024)  Tobacco Use: Medium Risk (07/22/2024)     Readmission Risk Interventions     No data to display

## 2024-07-25 NOTE — Plan of Care (Signed)
  Problem: Education: Goal: Knowledge of General Education information will improve Description: Including pain rating scale, medication(s)/side effects and non-pharmacologic comfort measures Outcome: Adequate for Discharge   Problem: Health Behavior/Discharge Planning: Goal: Ability to manage health-related needs will improve Outcome: Adequate for Discharge   Problem: Clinical Measurements: Goal: Ability to maintain clinical measurements within normal limits will improve Outcome: Adequate for Discharge Goal: Will remain free from infection Outcome: Adequate for Discharge Goal: Diagnostic test results will improve Outcome: Adequate for Discharge Goal: Respiratory complications will improve Outcome: Adequate for Discharge Goal: Cardiovascular complication will be avoided Outcome: Adequate for Discharge   Problem: Activity: Goal: Risk for activity intolerance will decrease Outcome: Adequate for Discharge   Problem: Nutrition: Goal: Adequate nutrition will be maintained Outcome: Adequate for Discharge   Problem: Coping: Goal: Level of anxiety will decrease Outcome: Adequate for Discharge   Problem: Elimination: Goal: Will not experience complications related to bowel motility Outcome: Adequate for Discharge Goal: Will not experience complications related to urinary retention Outcome: Adequate for Discharge   Problem: Pain Managment: Goal: General experience of comfort will improve and/or be controlled Outcome: Adequate for Discharge   Problem: Safety: Goal: Ability to remain free from injury will improve Outcome: Adequate for Discharge   Problem: Skin Integrity: Goal: Risk for impaired skin integrity will decrease Outcome: Adequate for Discharge   Problem: Education: Goal: Ability to verbalize activity precautions or restrictions will improve Outcome: Adequate for Discharge Goal: Knowledge of the prescribed therapeutic regimen will improve Outcome: Adequate for  Discharge Goal: Understanding of discharge needs will improve Outcome: Adequate for Discharge   Problem: Activity: Goal: Ability to avoid complications of mobility impairment will improve Outcome: Adequate for Discharge Goal: Ability to tolerate increased activity will improve Outcome: Adequate for Discharge Goal: Will remain free from falls Outcome: Adequate for Discharge   Problem: Bowel/Gastric: Goal: Gastrointestinal status for postoperative course will improve Outcome: Adequate for Discharge   Problem: Clinical Measurements: Goal: Ability to maintain clinical measurements within normal limits will improve Outcome: Adequate for Discharge Goal: Postoperative complications will be avoided or minimized Outcome: Adequate for Discharge Goal: Diagnostic test results will improve Outcome: Adequate for Discharge   Problem: Pain Management: Goal: Pain level will decrease Outcome: Adequate for Discharge   Problem: Skin Integrity: Goal: Will show signs of wound healing Outcome: Adequate for Discharge   Problem: Health Behavior/Discharge Planning: Goal: Identification of resources available to assist in meeting health care needs will improve Outcome: Adequate for Discharge   Problem: Bladder/Genitourinary: Goal: Urinary functional status for postoperative course will improve Outcome: Adequate for Discharge

## 2024-07-26 ENCOUNTER — Telehealth: Payer: Self-pay | Admitting: Neurosurgery

## 2024-07-26 ENCOUNTER — Telehealth: Payer: Self-pay

## 2024-07-26 MED ORDER — CELECOXIB 200 MG PO CAPS
200.0000 mg | ORAL_CAPSULE | Freq: Two times a day (BID) | ORAL | 0 refills | Status: AC
Start: 2024-07-26 — End: 2024-08-10

## 2024-07-26 NOTE — Transitions of Care (Post Inpatient/ED Visit) (Signed)
   07/26/2024  Name: Kathleen Carr MRN: [id number] DOB: 07/09/1971  Today's TOC FU Call Status: Today's TOC FU Call Status:: Unsuccessful Call (1st Attempt) Unsuccessful Call (1st Attempt) Date: 07/26/24  Attempted to reach the patient regarding the most recent Inpatient/ED visit.  HIPAA verified with patient. Patient tearful stating her pain level was 9 even after taking her pain medication.  Patient advised to call the surgeon's office today to report increase pain unrelieved by prescribed pain medication/ treatment.  Patient verbalized understanding and agreement.   Follow Up Plan: Additional outreach attempts will be made to reach the patient to complete the Transitions of Care (Post Inpatient/ED visit) call.   Arvin Seip RN, BSN, CCM [employer], Population Health Case Manager Phone: [phone]

## 2024-07-26 NOTE — Telephone Encounter (Signed)
 Patient is calling to let our office know that she is not able to get her pain under control since being discharged from the hospital and is rating her pain at a 9/10.

## 2024-07-26 NOTE — Telephone Encounter (Signed)
 I spoke with the patient and relayed your recommendations. She states she is agreeable to trying the celebrex  if we think it will help. I told her you will send it in and advised she call us  back if these changes are not helping.

## 2024-07-26 NOTE — Telephone Encounter (Signed)
 I spoke with the patient. Her pain is at her incision site and across the middle of her back and her right hip. She cannot get comfortable due to the pain. She denies new/different symptoms from when [facility] saw her prior to discharge.  She is taking: Methocarbamol  500mg  every 6 hours Oxycodone  2 tablets every 3 hours Tylenol  1000mg  with the methocarbamol    [facility]

## 2024-07-27 ENCOUNTER — Telehealth: Payer: Self-pay

## 2024-07-27 NOTE — Transitions of Care (Post Inpatient/ED Visit) (Signed)
 07/27/2024  Name: Kathleen Carr MRN: [id number] DOB: 1971-04-27  Today's TOC FU Call Status: Today's TOC FU Call Status:: Successful TOC FU Call Completed TOC FU Call Complete Date: 07/27/24 Patient's Name and Date of Birth confirmed.  Transition Care Management Follow-up Telephone Call How have you been since you were released from the hospital?: Better Any questions or concerns?: No  Items Reviewed: Did you receive and understand the discharge instructions provided?: Yes Medications obtained,verified, and reconciled?: Yes (Medications Reviewed) Any new allergies since your discharge?: No Dietary orders reviewed?: Yes Type of Diet Ordered:: diabetic, Do you have support at home?: Yes People in Home [RPT]: significant other  Medications Reviewed Today: Medications Reviewed Today     Reviewed by Rumalda Alan PENNER, RN (Registered Nurse) on 07/27/24 at 1513  Med List Status: <None>   Medication Order Taking? Sig Documenting Provider Last Dose Status Informant  albuterol  (VENTOLIN  HFA) 108 (90 Base) MCG/ACT inhaler [id number] Yes Inhale 1-2 puffs into the lungs every 6 (six) hours as needed for wheezing or shortness of breath. [provider]  Active Self  ALPRAZolam  (XANAX ) 0.25 MG tablet [id number] Yes Take 1 tablet (0.25 mg total) by mouth at bedtime as needed for anxiety. Marylynn Verneita CROME, MD  Active Self  atorvastatin  (LIPITOR) 20 MG tablet [id number] Yes TAKE 1 TABLET BY MOUTH EVERY DAY Tullo, Teresa L, MD  Active   celecoxib  (CELEBREX ) 200 MG capsule [id number] Yes Take 1 capsule (200 mg total) by mouth 2 (two) times daily for 15 days. Gregory Edsel Ruth, PA  Active   cyanocobalamin  (VITAMIN B12) 1000 MCG/ML injection [id number] Yes INJECT 1 ML (1,000 MCG TOTAL) INTO THE MUSCLE EVERY 30 DAYS. Melanee Annah BROCKS, MD  Active   docusate sodium  (COLACE) 100 MG capsule [id number] Yes Take 100 mg by mouth daily. [provider]  Active Self  levothyroxine  (SYNTHROID ) 50 MCG tablet  [id number] Yes TAKE 1 TABLET BY MOUTH DAILY BEFORE BREAKFAST Marylynn Verneita CROME, MD  Active   loratadine  (CLARITIN ) 10 MG tablet [id number] Yes Take 10 mg by mouth daily as needed for allergies. [provider]  Active Self  Magnesium  Glycinate 120 MG CAPS [id number] Yes Take 120 mg by mouth at bedtime. [provider]  Active Self  metFORMIN  (GLUCOPHAGE ) 1000 MG tablet [id number] Yes TAKE 1 TABLET BY MOUTH TWICE A DAY WITH FOOD Marylynn Verneita CROME, MD  Active Self  methocarbamol  (ROBAXIN ) 500 MG tablet [id number] Yes Take 1 tablet (500 mg total) by mouth every 6 (six) hours as needed for muscle spasms. Gregory Edsel Ruth, PA  Active   ondansetron  (ZOFRAN ) 4 MG tablet [id number] Yes Take 1 tablet (4 mg total) by mouth every 6 (six) hours as needed for nausea. Marylynn Verneita CROME, MD  Active Self  oxybutynin  (DITROPAN  XL) 15 MG 24 hr tablet [id number] Yes TAKE 1 TABLET BY MOUTH EVERYDAY AT BEDTIME Marylynn Verneita CROME, MD  Active Self  oxyCODONE  (OXY IR/ROXICODONE ) 5 MG immediate release tablet [id number] Yes Take 1-2 tablets (5-10 mg total) by mouth every 3 (three) hours as needed for up to 5 days for moderate pain (pain score 4-6) or severe pain (pain score 7-10). Gregory Edsel Ruth, [place]  Active   pantoprazole  (PROTONIX ) 40 MG tablet [id number] Yes TAKE 1 TABLET BY MOUTH EVERY DAY Tullo, Teresa L, MD  Active   polyethylene glycol powder (GLYCOLAX /MIRALAX ) 17 GM/SCOOP powder [id number] Yes Take 17 g by mouth daily as needed for mild constipation. Dissolve 1 capful (17g) in 4-8 ounces  of liquid and take by mouth daily. Gregory Edsel Ruth, [place]  Active   QUEtiapine  (SEROQUEL ) 300 MG tablet [id number] Yes TAKE 1 TABLET BY MOUTH EVERYDAY AT BEDTIME Tullo, Teresa L, MD  Active Self  rOPINIRole  (REQUIP ) 0.25 MG tablet [id number] Yes TAKE 1 TABLET BY MOUTH THREE TIMES A DAY Marylynn Verneita CROME, MD  Active Self  senna (SENOKOT) 8.6 MG TABS tablet [id number] Yes Take 1 tablet (8.6 mg total) by mouth 2 (two) times daily. Gregory Edsel Ruth, PA  Active   sertraline  (ZOLOFT ) 100 MG tablet [id number] Yes Take 1 tablet (100 mg total) by mouth daily. Marylynn Verneita CROME, MD  Active Self    Discontinued 07/28/22 1620 (Change in therapy)     Discontinued 01/22/21 1321 (Not covered by the pt's insurance)   Syringe/Needle, Disp, (SYRINGE 3CC/25GX1) 25G X 1 3 ML MISC [id number]  1 Syringe by Does not apply route every 30 (thirty) days. Melanee Annah BROCKS, MD  Active Self  tirzepatide  (MOUNJARO ) 5 MG/0.5ML Pen [id number] Yes Inject 5 mg into the skin once a week. Marylynn Verneita CROME, MD  Active             Home Care and Equipment/Supplies: Were Home Health Services Ordered?: No Any new equipment or medical supplies ordered?: No  Functional Questionnaire: Do you need assistance with bathing/showering or dressing?: Yes Do you need assistance with meal preparation?: Yes Do you need assistance with eating?: No Do you have difficulty maintaining continence: No Do you need assistance with getting out of bed/getting out of a chair/moving?: No Do you have difficulty managing or taking your medications?: No  Follow up appointments reviewed: PCP Follow-up appointment confirmed?: Yes Date of PCP follow-up appointment?: 08/03/24 Follow-up Provider: PCP Specialist Hospital Follow-up appointment confirmed?: Yes Date of Specialist follow-up appointment?: 08/03/24 Follow-Up Specialty Provider:: neurosurgeon Do you need transportation to your follow-up appointment?: No Do you understand care options if your condition(s) worsen?: Yes-patient verbalized understanding  SDOH Interventions Today    Flowsheet Row Most Recent Value  SDOH Interventions   Food Insecurity Interventions Intervention Not Indicated  Housing Interventions Intervention Not Indicated  Transportation Interventions Intervention Not Indicated  Utilities Interventions Intervention Not Indicated   Patient enrolled in [facility] program.   Goals Addressed             This  Visit's Progress    [facility] Transitions of Care (TOC) Care Plan       Problems:  Recent Hospitalization for treatment of lumbar fusion  Goal:  Over the next 30 days, the patient will not experience hospital readmission  Interventions:  Transitions of Care: Doctor Visits  - discussed the importance of doctor visits Post discharge activity limitations prescribed by provider reviewed Post-op wound/incision care reviewed with patient/caregiver Reviewed Signs and symptoms of infection Reviewed treatment for constipation. Reviewed pain level and pain medications Discuss NSAID that was newly RX. Reviewed bleeding precaution, GI precaution and renal precaution related to previous allergic reaction to NSAID. Reviewed good support system Confirmed patient has all her medications and is taking them as prescribed. Reviewed importance of hydration, OTC meds for constipation and importance of getting up and moving around frequently.  Reviewed and offered 30 day TOC and patient consented.  Scheduled next follow up appointment Provided my contact information for patient to call me if needed.  This note sent to PCP  Surgery (s/p lumbar fusion): Evaluation of current treatment plan related to lumbar infusion surgery assessed patient/caregiver understanding of surgical procedure   reviewed  post-operative instructions with patient/caregiver reviewed medications with patient and addressed questions reviewed scheduled provider appointments with patient- PCP and surgeon. Suggested PCP physical be changed to hospital follow up. Patient to call MD office and change visit type. confirmed availability of transportation to all appointments yes performed PHQ2-PHQ9 assessment    Reviewed constipation  Patient Self Care Activities:  Attend all scheduled provider appointments Call pharmacy for medication refills 3-7 days in advance of running out of medications Call provider office for new concerns or questions   Notify RN Care Manager of TOC call rescheduling needs Participate in Transition of Care Program/Attend TOC scheduled calls Take medications as prescribed   Take medications to help with constipation Call PCP office and change appointment type.   Plan:  Telephone follow up appointment with care management team member scheduled for:  Davina Green RN CM  08/04/2024 at 1pm The patient has been provided with contact information for the care management team and has been advised to call with any health related questions or concerns.         Alan Ee, RN, BSN, CEN [employer]- Transition of Care Team.  [facility] [phone]

## 2024-07-27 NOTE — Transitions of Care (Post Inpatient/ED Visit) (Signed)
   07/27/2024  Name: Temika D. Fuchs MRN: [id number] DOB: 1971-04-12  Today's TOC FU Call Status: Today's TOC FU Call Status:: Unsuccessful Call (2nd Attempt) Unsuccessful Call (2nd Attempt) Date: 07/27/24  Attempted to reach the patient regarding the most recent Inpatient/ED visit.  Follow Up Plan: Additional outreach attempts will be made to reach the patient to complete the Transitions of Care (Post Inpatient/ED visit) call.   Arvin Seip RN, BSN, CCM [employer], Population Health Case Manager Phone: [phone]

## 2024-08-01 ENCOUNTER — Other Ambulatory Visit: Payer: Self-pay | Admitting: Internal Medicine

## 2024-08-01 DIAGNOSIS — G4700 Insomnia, unspecified: Secondary | ICD-10-CM

## 2024-08-01 DIAGNOSIS — F319 Bipolar disorder, unspecified: Secondary | ICD-10-CM

## 2024-08-03 ENCOUNTER — Encounter: Payer: Self-pay | Admitting: Physician Assistant

## 2024-08-03 ENCOUNTER — Encounter: Payer: Self-pay | Admitting: Internal Medicine

## 2024-08-03 ENCOUNTER — Ambulatory Visit: Admitting: Physician Assistant

## 2024-08-03 ENCOUNTER — Telehealth: Payer: Self-pay | Admitting: Physician Assistant

## 2024-08-03 ENCOUNTER — Ambulatory Visit: Admitting: Internal Medicine

## 2024-08-03 VITALS — BP 116/68 | Temp 98.7°F | Ht 64.0 in | Wt 140.0 lb

## 2024-08-03 VITALS — BP 94/52 | HR 88 | Ht 64.0 in | Wt 140.0 lb

## 2024-08-03 DIAGNOSIS — E119 Type 2 diabetes mellitus without complications: Secondary | ICD-10-CM | POA: Diagnosis not present

## 2024-08-03 DIAGNOSIS — R7989 Other specified abnormal findings of blood chemistry: Secondary | ICD-10-CM

## 2024-08-03 DIAGNOSIS — E785 Hyperlipidemia, unspecified: Secondary | ICD-10-CM | POA: Diagnosis not present

## 2024-08-03 DIAGNOSIS — F3170 Bipolar disorder, currently in remission, most recent episode unspecified: Secondary | ICD-10-CM

## 2024-08-03 DIAGNOSIS — M4714 Other spondylosis with myelopathy, thoracic region: Secondary | ICD-10-CM

## 2024-08-03 DIAGNOSIS — G4700 Insomnia, unspecified: Secondary | ICD-10-CM

## 2024-08-03 DIAGNOSIS — E039 Hypothyroidism, unspecified: Secondary | ICD-10-CM

## 2024-08-03 DIAGNOSIS — M5124 Other intervertebral disc displacement, thoracic region: Secondary | ICD-10-CM

## 2024-08-03 DIAGNOSIS — Z09 Encounter for follow-up examination after completed treatment for conditions other than malignant neoplasm: Secondary | ICD-10-CM

## 2024-08-03 DIAGNOSIS — E669 Obesity, unspecified: Secondary | ICD-10-CM

## 2024-08-03 DIAGNOSIS — F319 Bipolar disorder, unspecified: Secondary | ICD-10-CM

## 2024-08-03 MED ORDER — OXYBUTYNIN CHLORIDE ER 15 MG PO TB24: ORAL_TABLET | ORAL | 2 refills | Status: AC

## 2024-08-03 MED ORDER — METFORMIN HCL 1000 MG PO TABS: 1000.0000 mg | ORAL_TABLET | Freq: Two times a day (BID) | ORAL | 2 refills | Status: AC

## 2024-08-03 MED ORDER — ALPRAZOLAM 0.25 MG PO TABS: 0.2500 mg | ORAL_TABLET | Freq: Every evening | ORAL | 5 refills | Status: AC | PRN

## 2024-08-03 MED ORDER — QUETIAPINE FUMARATE 300 MG PO TABS: ORAL_TABLET | ORAL | 2 refills | Status: AC

## 2024-08-03 MED ORDER — LEVOTHYROXINE SODIUM 50 MCG PO TABS: 50.0000 ug | ORAL_TABLET | Freq: Every day | ORAL | 1 refills | Status: DC

## 2024-08-03 MED ORDER — ROPINIROLE HCL 0.25 MG PO TABS: 0.2500 mg | ORAL_TABLET | Freq: Three times a day (TID) | ORAL | 2 refills | Status: AC

## 2024-08-03 MED ORDER — OXYCODONE HCL 5 MG PO TABS: 5.0000 mg | ORAL_TABLET | Freq: Four times a day (QID) | ORAL | 0 refills | Status: DC | PRN

## 2024-08-03 MED ORDER — OXYCODONE HCL 5 MG PO TABS: 5.0000 mg | ORAL_TABLET | Freq: Four times a day (QID) | ORAL | 0 refills | Status: AC | PRN

## 2024-08-03 NOTE — Telephone Encounter (Signed)
 Dr. Claudene sent the medication to the pharmacy. I informed patient and she is requesting a brace. She states she is bending over and not sitting straight.

## 2024-08-03 NOTE — Patient Instructions (Signed)
 You need to increase your protein stores ,  use the Premier Protein shakes ONCE DAILY  DO NOT LOSE ANY MORE WEIGHT.  You will not heal if you continue to be in a catabolic state  Alprazolam  refilled

## 2024-08-03 NOTE — Progress Notes (Unsigned)
 Subjective:  Patient ID: Kathleen Carr, female    DOB: 03/23/1971  Age: 53 y.o. MRN: [id number]  CC: The primary encounter diagnosis was Hypothyroidism, unspecified type. Diagnoses of Bipolar 1 disorder (HCC), Insomnia, unspecified type, Type 2 diabetes mellitus without complication, without long-term current use of insulin  (HCC), and Hyperlipidemia, unspecified hyperlipidemia type were also pertinent to this visit.   HPI Kathleen Carr presents for  Chief Complaint  Patient presents with   Hospitalization Follow-up    Hospital follow up from having back surgery on 07/22/2024    Kathleen Carr was admitted to [facility] on Sept 26 for  T7-8 transpedicular discectomy and T7-8 PSF for decompression.   Severe stenosis aggravated y   several falls,  the last one end of August  Her intraoperative course was uncomplicated. She was admitted for pain control, therapy evaluation, and drain output monitoring. She was seen and evaluated by therapy. Therapy signed off on POD1 not recommending further needs. Her drain output taper off and was removed on POD3. She reported significant back pain but overall felt it was managed with oral pain medications. She was discharged home on POD3 with medications as needed for pain , but did not receive her home meds until Day 3,  including metformin  seroqeul and sertraline    Staples were removed today .  Restricted from lifting anything , bending at the waist or twisting and squatting .  bowels started moving a few days ago.  Has resumed Mounjaro   at 5 mg .  Diet reviewed:  appetite is reduced,  need for increase in daily protein intake dicussed  Insomnia: needs refill on alprazolam    Lab Results  Component Value Date   HGBA1C 6.2 02/02/2024     Outpatient Medications Prior to Visit  Medication Sig Dispense Refill   albuterol  (VENTOLIN  HFA) 108 (90 Base) MCG/ACT inhaler Inhale 1-2 puffs into the lungs every 6 (six) hours as needed for wheezing or shortness of breath.      ALPRAZolam  (XANAX ) 0.25 MG tablet Take 1 tablet (0.25 mg total) by mouth at bedtime as needed for anxiety. 30 tablet 5   atorvastatin  (LIPITOR) 20 MG tablet TAKE 1 TABLET BY MOUTH EVERY DAY 90 tablet 3   celecoxib  (CELEBREX ) 200 MG capsule Take 1 capsule (200 mg total) by mouth 2 (two) times daily for 15 days. 30 capsule 0   cyanocobalamin  (VITAMIN B12) 1000 MCG/ML injection INJECT 1 ML (1,000 MCG TOTAL) INTO THE MUSCLE EVERY 30 DAYS. 3 mL 3   docusate sodium  (COLACE) 100 MG capsule Take 100 mg by mouth daily.     levothyroxine  (SYNTHROID ) 50 MCG tablet TAKE 1 TABLET BY MOUTH DAILY BEFORE BREAKFAST 90 tablet 1   loratadine  (CLARITIN ) 10 MG tablet Take 10 mg by mouth daily as needed for allergies.     Magnesium  Glycinate 120 MG CAPS Take 120 mg by mouth at bedtime.     metFORMIN  (GLUCOPHAGE ) 1000 MG tablet TAKE 1 TABLET BY MOUTH TWICE A DAY WITH FOOD 180 tablet 2   methocarbamol  (ROBAXIN ) 500 MG tablet Take 1 tablet (500 mg total) by mouth every 6 (six) hours as needed for muscle spasms. 120 tablet 0   ondansetron  (ZOFRAN ) 4 MG tablet Take 1 tablet (4 mg total) by mouth every 6 (six) hours as needed for nausea. 20 tablet 0   oxybutynin  (DITROPAN  XL) 15 MG 24 hr tablet TAKE 1 TABLET BY MOUTH EVERYDAY AT BEDTIME 90 tablet 2   oxyCODONE  (OXY IR/ROXICODONE ) 5 MG immediate release  tablet Take 1 tablet (5 mg total) by mouth every 6 (six) hours as needed for up to 5 days for moderate pain (pain score 4-6) or severe pain (pain score 7-10). 20 tablet 0   pantoprazole  (PROTONIX ) 40 MG tablet TAKE 1 TABLET BY MOUTH EVERY DAY 90 tablet 3   polyethylene glycol powder (GLYCOLAX /MIRALAX ) 17 GM/SCOOP powder Take 17 g by mouth daily as needed for mild constipation. Dissolve 1 capful (17g) in 4-8 ounces of liquid and take by mouth daily. 238 g 0   QUEtiapine  (SEROQUEL ) 300 MG tablet TAKE 1 TABLET BY MOUTH EVERYDAY AT BEDTIME 90 tablet 2   rOPINIRole  (REQUIP ) 0.25 MG tablet TAKE 1 TABLET BY MOUTH THREE TIMES A DAY 270  tablet 2   senna (SENOKOT) 8.6 MG TABS tablet Take 1 tablet (8.6 mg total) by mouth 2 (two) times daily. 120 tablet 0   sertraline  (ZOLOFT ) 100 MG tablet TAKE 1 TABLET BY MOUTH EVERY DAY 30 tablet 5   Syringe/Needle, Disp, (SYRINGE 3CC/25GX1) 25G X 1 3 ML MISC 1 Syringe by Does not apply route every 30 (thirty) days. 12 each 0   tirzepatide  (MOUNJARO ) 5 MG/0.5ML Pen Inject 5 mg into the skin once a week. 6 mL 2   No facility-administered medications prior to visit.    Review of Systems;  Patient denies headache, fevers, malaise, unintentional weight loss, skin rash, eye pain, sinus congestion and sinus pain, sore throat, dysphagia,  hemoptysis , cough, dyspnea, wheezing, chest pain, palpitations, orthopnea, edema, abdominal pain, nausea, melena, diarrhea, constipation, flank pain, dysuria, hematuria, urinary  Frequency, nocturia, numbness, tingling, seizures,  Focal weakness, Loss of consciousness,  Tremor, insomnia, depression, anxiety, and suicidal ideation.      Objective:  BP (!) 94/52   Pulse 88   Ht 5' 4 (1.626 m)   Wt 140 lb (63.5 kg)   SpO2 97%   BMI 24.03 kg/m   BP Readings from Last 3 Encounters:  08/03/24 (!) 94/52  08/03/24 116/68  07/25/24 (!) 101/55    Wt Readings from Last 3 Encounters:  08/03/24 140 lb (63.5 kg)  08/03/24 140 lb (63.5 kg)  07/22/24 140 lb (63.5 kg)    Physical Exam Vitals reviewed.  Constitutional:      General: She is not in acute distress.    Appearance: Normal appearance. She is normal weight. She is not ill-appearing, toxic-appearing or diaphoretic.  HENT:     Head: Normocephalic.  Eyes:     General: No scleral icterus.       Right eye: No discharge.        Left eye: No discharge.     Conjunctiva/sclera: Conjunctivae normal.  Cardiovascular:     Rate and Rhythm: Normal rate and regular rhythm.     Heart sounds: Normal heart sounds.  Pulmonary:     Effort: Pulmonary effort is normal. No respiratory distress.     Breath  sounds: Normal breath sounds.  Musculoskeletal:        General: Normal range of motion.  Skin:    General: Skin is warm and dry.     Findings: Wound present.         Comments: Midline surgical wound without erythema or drainage .  Tender to palpation  Neurological:     General: No focal deficit present.     Mental Status: She is alert and oriented to person, place, and time. Mental status is at baseline.  Psychiatric:        Mood and Affect:  Mood normal.        Behavior: Behavior normal.        Thought Content: Thought content normal.        Judgment: Judgment normal.    Lab Results  Component Value Date   HGBA1C 6.2 02/02/2024   HGBA1C 6.4 07/08/2023   HGBA1C 6.4 12/29/2022    Lab Results  Component Value Date   CREATININE 0.90 07/20/2024   CREATININE 1.00 02/02/2024   CREATININE 0.95 07/08/2023    Lab Results  Component Value Date   WBC 10.1 07/20/2024   HGB 12.4 07/20/2024   HCT 36.8 07/20/2024   PLT 389 07/20/2024   GLUCOSE 74 07/20/2024   CHOL 130 02/02/2024   TRIG 195.0 (H) 02/02/2024   HDL 43.50 02/02/2024   LDLDIRECT 62.0 02/02/2024   LDLCALC 48 02/02/2024   ALT 11 02/02/2024   AST 15 02/02/2024   NA 137 07/20/2024   K 4.1 07/20/2024   CL 103 07/20/2024   CREATININE 0.90 07/20/2024   BUN 13 07/20/2024   CO2 22 07/20/2024   TSH 1.39 02/02/2024   INR 0.9 02/02/2024   HGBA1C 6.2 02/02/2024   MICROALBUR 1.7 02/02/2024    DG Thoracic Spine 2 View Result Date: 07/22/2024 EXAM: 2 VIEW(S) XRAY OF THE THORACIC SPINE 07/22/2024 10:20:19 AM COMPARISON: MRI dated 07/10/2024. CLINICAL HISTORY: Elective thoracic fusion surgery. FINDINGS: BONES: Multiple rod and pedicle screw fixation placement is demonstrated at the intervertebral level at the upper margin of the dorsal column stimulator leads. On the MRI of 07/10/2024, this corresponds to the T7-T8 level. No acute fracture. No aggressive appearing osseous lesion. Alignment is normal. DISCS AND DEGENERATIVE  CHANGES: No severe degenerative changes. SOFT TISSUES: Initial images demonstrate the dorsal column stimulator and tissue spreaders. The visualized lungs are clear. IMPRESSION: 1. No acute abnormalities. 2. Dorsal column stimulator and tissue spreaders. 3. Thoracic pedicle screw and rod fixation at the level corresponding to T7-8 based on corroboration with dorsal column stimulator lead positioning . Electronically signed by: Ryan Salvage MD 07/22/2024 03:01 PM EDT RP Workstation: [id number]   DG C-Arm 1-60 Min-No Report Result Date: 07/22/2024 Fluoroscopy was utilized by the requesting physician.  No radiographic interpretation.   DG C-Arm 1-60 Min-No Report Result Date: 07/22/2024 Fluoroscopy was utilized by the requesting physician.  No radiographic interpretation.   DG C-Arm 1-60 Min-No Report Result Date: 07/22/2024 Fluoroscopy was utilized by the requesting physician.  No radiographic interpretation.    Assessment & Plan:  .Hypothyroidism, unspecified type  Bipolar 1 disorder (HCC)  Insomnia, unspecified type  Type 2 diabetes mellitus without complication, without long-term current use of insulin  (HCC)  Hyperlipidemia, unspecified hyperlipidemia type     I spent 34 minutes on the day of this face to face encounter reviewing patient's  most recent visit with cardiology,  nephrology,  and neurology,  prior relevant surgical and non surgical procedures, recent  labs and imaging studies, counseling on weight management,  reviewing the assessment and plan with patient, and post visit ordering and reviewing of  diagnostics and therapeutics with patient  .   Follow-up: No follow-ups on file.   Verneita LITTIE Kettering, MD

## 2024-08-03 NOTE — Progress Notes (Signed)
   REFERRING PHYSICIAN:  Marylynn Verneita CROME, Md 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   HISTORY OF PRESENT ILLNESS: Kathleen Carr is approximately 2 weeks status post T7-8 discectomy and fusoin for thoracic myelopathy. she is doing well.  She continues to have some unsteadiness on her feet but her walking has improved.  Her chest pain has also improved.  She continues to take Robaxin  and oxycodone  for pain    PHYSICAL EXAMINATION:  General: Patient is well developed, well nourished, calm, collected, and in no apparent distress.   NEUROLOGICAL:  General: In no acute distress.   Awake, alert, oriented to person, place, and time.  Pupils equal round and reactive to light.  Facial tone is symmetric.     Strength:  Patient at least a 4+/5 in bilateral upper extremities.  Some pain when testing strength.          Side Iliopsoas Quads Hamstring PF DF EHL  R 4+ 5 5 5 5 5   L 4+ 5 5 5 5 5    Incision c/d/i   ROS (Neurologic):  Negative except as noted above  IMAGING: No new imaging since surgery.  ASSESSMENT/PLAN:  Kathleen Carr is doing well approximately 2 weeks after T7-8 discectomy fusion for thoracic myelopathy. she will follow up in approximately 1 month for her 6-week postop visit. I have advised the patient to lift up to 10 pounds until 6 weeks after surgery, then increase up to 25 pounds until 12 weeks after surgery.  After 12 weeks post-op, the patient advised to increase activity as tolerated.  Robaxin  and oxycodone  refills were given to the patient.  Advised to contact the office if any questions or concerns arise.  Lyle Decamp PA-C Department of neurosurgery

## 2024-08-03 NOTE — Telephone Encounter (Signed)
 Patient states her oxycodone  is out of stock at the [facility] on [facility].  Please switch to [facility] [address] [place], [place] [address]

## 2024-08-04 ENCOUNTER — Other Ambulatory Visit: Payer: Self-pay | Admitting: Physician Assistant

## 2024-08-04 ENCOUNTER — Telehealth: Payer: Self-pay | Admitting: Neurosurgery

## 2024-08-04 ENCOUNTER — Other Ambulatory Visit: Payer: Self-pay

## 2024-08-04 DIAGNOSIS — M4714 Other spondylosis with myelopathy, thoracic region: Secondary | ICD-10-CM

## 2024-08-04 LAB — HEMOGLOBIN A1C: Hgb A1c MFr Bld: 5.7 % (ref 4.6–6.5)

## 2024-08-04 LAB — LDL CHOLESTEROL, DIRECT: Direct LDL: 58 mg/dL

## 2024-08-04 LAB — COMPREHENSIVE METABOLIC PANEL WITH GFR
ALT: 42 U/L — ABNORMAL HIGH (ref 0–35)
AST: 39 U/L — ABNORMAL HIGH (ref 0–37)
Albumin: 4.4 g/dL (ref 3.5–5.2)
Alkaline Phosphatase: 72 U/L (ref 39–117)
BUN: 11 mg/dL (ref 6–23)
CO2: 26 meq/L (ref 19–32)
Calcium: 9.4 mg/dL (ref 8.4–10.5)
Chloride: 98 meq/L (ref 96–112)
Creatinine, Ser: 1.04 mg/dL (ref 0.40–1.20)
GFR: 61.64 mL/min (ref >60.00)
Glucose, Bld: 75 mg/dL (ref 70–99)
Potassium: 4.6 meq/L (ref 3.5–5.1)
Sodium: 135 meq/L (ref 135–145)
Total Bilirubin: 0.3 mg/dL (ref 0.2–1.2)
Total Protein: 6.9 g/dL (ref 6.0–8.3)

## 2024-08-04 LAB — LIPID PANEL
Cholesterol: 127 mg/dL (ref 0–200)
HDL: 30.7 mg/dL — ABNORMAL LOW (ref >39.00)
LDL Cholesterol: 41 mg/dL (ref 0–99)
NonHDL: 95.81
Total CHOL/HDL Ratio: 4
Triglycerides: 274 mg/dL — ABNORMAL HIGH (ref 0.0–149.0)
VLDL: 54.8 mg/dL — ABNORMAL HIGH (ref 0.0–40.0)

## 2024-08-04 LAB — TSH: TSH: 0.58 u[IU]/mL (ref 0.35–5.50)

## 2024-08-04 NOTE — Transitions of Care (Post Inpatient/ED Visit) (Signed)
 Transition of Care week 2  Visit Note  08/04/2024  Name: Kathleen Carr MRN: [id number]          DOB: [birth year]  Situation: Patient enrolled in [facility] 30-day program. Visit completed with patient by telephone.   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   Pneumonia    Postlaminectomy syndrome, lumbar region    PTSD (post-traumatic stress disorder)    Sleep apnea 2016   Smoker    Stroke [facility])    Thoracic disc herniation    Thoracic spondylosis with myelopathy    Thyroid  disease 2021   TIA (transient ischemic attack) 2024   possible TIA per patient back in 2024    Assessment: Patient Reported Symptoms: Cognitive Cognitive Status: No symptoms reported, Alert and oriented to person, place, and time, Insightful and able to interpret abstract concepts, Normal speech and language skills      Neurological Neurological Review of Symptoms: No symptoms reported    HEENT HEENT Symptoms Reported: No symptoms reported      Cardiovascular Cardiovascular Symptoms Reported: No symptoms reported    Respiratory Respiratory Symptoms Reported: No symptoms reported    Endocrine Endocrine Symptoms Reported: No symptoms reported Is patient diabetic?: Yes Endocrine Comment: Discussed Hgb A1c lab result from 08/03/24. Congratulated patient on Hgb A1c of 5.7   Patient states she does not monitor her blood sugars at home.   Gastrointestinal Gastrointestinal Symptoms Reported: No symptoms reported      Genitourinary Genitourinary Symptoms Reported: No symptoms reported    Integumentary Integumentary Symptoms Reported: Incision Additional Integumentary Details: patient reports having surgical post op visit on yesterday 08/03/24.  She states her staples were removed and she was advised by the surgeon that her incision was healing well. Patient reports ongoing surgical pain with pain level being a 7 today.  she states she took her pain medication this morning and her wife will be picking up her pain medication prescription this afternoon from the pharmacy Skin Management Strategies: Routine screening, Medication therapy  Musculoskeletal Musculoskelatal Symptoms Reviewed: No symptoms reported        Psychosocial Psychosocial Symptoms Reported: No symptoms reported         There were no vitals filed for this visit.  Medications Reviewed Today     Reviewed by Tomasita Beevers E, RN (Registered Nurse) on 08/04/24 at 1306  Med List Status: <None>   Medication Order Taking? Sig Documenting Provider Last Dose Status Informant  albuterol  (VENTOLIN  HFA) 108 (90 Base) MCG/ACT inhaler [id number] Yes Inhale 1-2 puffs into the lungs every 6 (six) hours as needed for wheezing or shortness of breath. [provider]  Active Self  ALPRAZolam  (XANAX ) 0.25 MG tablet [id number] Yes Take 1 tablet (0.25 mg total) by mouth at bedtime as needed for anxiety. Marylynn Verneita CROME, MD  Active  atorvastatin  (LIPITOR) 20 MG tablet [id number] Yes TAKE 1 TABLET BY MOUTH EVERY DAY Tullo, Teresa L, MD  Active   celecoxib  (CELEBREX ) 200 MG capsule [id number] Yes Take 1 capsule (200 mg total) by mouth 2 (two) times daily for 15 days. Gregory Edsel Ruth, PA  Active   cyanocobalamin  (VITAMIN B12) 1000 MCG/ML injection [id number] Yes INJECT 1 ML (1,000 MCG TOTAL) INTO THE MUSCLE EVERY 30 DAYS. Melanee Annah BROCKS, MD  Active   docusate sodium  (COLACE)  100 MG capsule [id number] Yes Take 100 mg by mouth daily. [provider]  Active Self  levothyroxine  (SYNTHROID ) 50 MCG tablet [id number] Yes Take 1 tablet (50 mcg total) by mouth daily before breakfast. Marylynn Verneita CROME, MD  Active   loratadine  (CLARITIN ) 10 MG tablet [id number] Yes Take 10 mg by mouth daily as needed for allergies. [provider]  Active Self  Magnesium  Glycinate 120 MG CAPS [id number] Yes Take 120 mg by mouth at bedtime. [provider]  Active Self  metFORMIN  (GLUCOPHAGE ) 1000 MG tablet [id number] Yes Take 1 tablet (1,000 mg total) by mouth 2 (two) times daily with a meal. Marylynn Verneita CROME, MD  Active   methocarbamol  (ROBAXIN ) 500 MG tablet [id number] Yes Take 1 tablet (500 mg total) by mouth every 6 (six) hours as needed for muscle spasms. Gregory Edsel Ruth, PA  Active   ondansetron  (ZOFRAN ) 4 MG tablet [id number] Yes Take 1 tablet (4 mg total) by mouth every 6 (six) hours as needed for nausea. Tullo, Teresa L, MD  Active Self  oxybutynin  (DITROPAN  XL) 15 MG 24 hr tablet [id number] Yes TAKE 1 TABLET BY MOUTH EVERYDAY AT BEDTIME Marylynn Verneita CROME, MD  Active   oxyCODONE  (OXY IR/ROXICODONE ) 5 MG immediate release tablet [id number] Yes Take 1 tablet (5 mg total) by mouth every 6 (six) hours as needed for up to 5 days for moderate pain (pain score 4-6) or severe pain (pain score 7-10). Claudene Penne ORN, MD  Active   pantoprazole  (PROTONIX ) 40 MG tablet [id number] Yes TAKE 1 TABLET BY MOUTH EVERY DAY Tullo, Teresa L, MD  Active   polyethylene glycol powder (GLYCOLAX /MIRALAX ) 17 GM/SCOOP powder [id number] Yes Take 17 g by mouth daily as needed for mild constipation. Dissolve 1 capful (17g) in 4-8 ounces of liquid and take by mouth daily. Gregory Edsel Ruth, [place]  Active   QUEtiapine  (SEROQUEL ) 300 MG tablet [id number] Yes TAKE 1 TABLET BY MOUTH EVERYDAY AT BEDTIME Tullo, Teresa L, MD  Active   rOPINIRole  (REQUIP ) 0.25 MG tablet [id number] Yes Take 1 tablet (0.25 mg total) by  mouth 3 (three) times daily. Marylynn Verneita CROME, MD  Active   senna (SENOKOT) 8.6 MG TABS tablet [id number] Yes Take 1 tablet (8.6 mg total) by mouth 2 (two) times daily. Gregory Edsel Ruth, [place]  Active   sertraline  (ZOLOFT ) 100 MG tablet [id number] Yes TAKE 1 TABLET BY MOUTH EVERY DAY Marylynn Verneita CROME, MD  Active     Discontinued 07/28/22 1620 (Change in therapy)     Discontinued 01/22/21 1321 (Not covered by the pt's insurance)   Syringe/Needle, Disp, (SYRINGE 3CC/25GX1) 25G X 1 3 ML MISC [id number] Yes 1 Syringe by Does not apply route every 30 (thirty) days. Melanee Annah BROCKS, MD  Active Self  tirzepatide  (MOUNJARO ) 5 MG/0.5ML Pen [id number] Yes Inject 5 mg into the skin once a week. Marylynn Verneita CROME, MD  Active             Goals Addressed  This Visit's Progress    VBCI Transitions of Care (TOC) Care Plan       Problems:  Recent Hospitalization for treatment of lumbar fusion  Goal:  Over the next 30 days, the patient will not experience hospital readmission  Interventions:  Transitions of Care: Post discharge activity limitations prescribed by provider reviewed Post-op wound/incision care reviewed with patient/caregiver Reviewed Signs and symptoms of infection Reviewed treatment for constipation. Reviewed pain level and pain medications Advised patient to increase protein consumption as recommended by her primary care provider Confirmed patient has all her medications and is taking them as prescribed. Reinforced importance of hydration, OTC meds for constipation and importance of getting up and moving around frequently.  Provided my contact information for patient to call me if needed.  Reviewed upcoming provider visits Reviewed signs of infection. Assessed for infection symptoms at surgical site.  Discussed primary care provider hospital follow up visit and post of surgery visit.   Evaluation of current treatment plan related to lumbar infusion surgery    Patient  Self Care Activities:  Call pharmacy for medication refills 3-7 days in advance of running out of medications Call provider office for new concerns or questions  Notify RN Care Manager of TOC call rescheduling needs Participate in Transition of Care Program/Attend TOC scheduled calls Take medications as prescribed   Take medications to help with constipation Increase protein consumption as recommended by your primary care provider.  Re-suggested protein supplement.  Plan:  Telephone follow up appointment with care management team member scheduled for:  Aqil Goetting RN CM  08/11/2024 at 1pm The patient has been provided with contact information for the care management team and has been advised to call with any health related questions or concerns.          Recommendation:   Continue Current Plan of Care  Follow Up Plan:   Telephone follow-up in 1 week  Arvin Seip RN, BSN, CCM [place]  [facility], Population Health Case Manager Phone: [phone]

## 2024-08-04 NOTE — Patient Instructions (Signed)
 Visit Information  Thank you for taking time to visit with me today. Please don't hesitate to contact me if I can be of assistance to you before our next scheduled telephone appointment.  Our next appointment is by telephone on 08/11/24 at 1 pm  Following is a copy of your care plan:   Goals Addressed             This Visit's Progress    VBCI Transitions of Care (TOC) Care Plan       Problems:  Recent Hospitalization for treatment of lumbar fusion  Goal:  Over the next 30 days, the patient will not experience hospital readmission  Interventions:  Transitions of Care: Post discharge activity limitations prescribed by provider reviewed Post-op wound/incision care reviewed with patient/caregiver Reviewed Signs and symptoms of infection Reviewed treatment for constipation. Reviewed pain level and pain medications Advised patient to increase protein consumption as recommended by her primary care provider Confirmed patient has all her medications and is taking them as prescribed. Reinforced importance of hydration, OTC meds for constipation and importance of getting up and moving around frequently.  Provided my contact information for patient to call me if needed.  Reviewed upcoming provider visits Reviewed signs of infection. Assessed for infection symptoms at surgical site.  Discussed primary care provider hospital follow up visit and post of surgery visit.   Evaluation of current treatment plan related to lumbar infusion surgery    Patient Self Care Activities:  Call pharmacy for medication refills 3-7 days in advance of running out of medications Call provider office for new concerns or questions  Notify RN Care Manager of TOC call rescheduling needs Participate in Transition of Care Program/Attend TOC scheduled calls Take medications as prescribed   Take medications to help with constipation Increase protein consumption as recommended by your primary care provider.   Re-suggested protein supplement.  Plan:  Telephone follow up appointment with care management team member scheduled for:  Kailia Starry RN CM  08/11/2024 at 1pm The patient has been provided with contact information for the care management team and has been advised to call with any health related questions or concerns.         Patient verbalizes understanding of instructions and care plan provided today and agrees to view in MyChart. Active MyChart status and patient understanding of how to access instructions and care plan via MyChart confirmed with patient.     The patient has been provided with contact information for the care management team and has been advised to call with any health related questions or concerns.   Please call the care guide team at [phone] if you need to cancel or reschedule your appointment.   Please call the Suicide and Crisis Lifeline: 988 call the [place]  National Suicide Prevention Lifeline: (843)561-7629 or TTY: (443)482-3980 TTY (601)220-4944) to talk to a trained counselor call 1-800-273-TALK (toll free, 24 hour hotline) if you are experiencing a Mental Health or Behavioral Health Crisis or need someone to talk to.  Arvin Seip RN, BSN, CCM [employer], Population Health Case Manager Phone: [phone]

## 2024-08-04 NOTE — Telephone Encounter (Signed)
 This is a duplicate message. There is a telephone call about this from yesterday afternoon that has already been routed to [place] and is pending a response. Will close this message.

## 2024-08-04 NOTE — Telephone Encounter (Signed)
 Patient called after hours nurse line and just now to the office about getting back brace and how to go about it and what kind if any particular suggestions on this.

## 2024-08-04 NOTE — Telephone Encounter (Signed)
 Please see below message from the answering service and advise.     Media Information   Document Information  AMB Correspondence  NEUROSURGERY ANSWERING SERVICE  08/03/2024 08:41  Attached To:  Kathleen Carr  Source [employer], Provider, MD

## 2024-08-05 ENCOUNTER — Encounter: Payer: Self-pay | Admitting: Internal Medicine

## 2024-08-05 NOTE — Assessment & Plan Note (Signed)
Patient is stable post discharge and has no new issues or questions about discharge plans at the visit today for hospital follow up. All labs , imaging studies and progress notes from admission were reviewed with patient today   

## 2024-08-05 NOTE — Assessment & Plan Note (Signed)
 Thyroid  function is WNL on current dose.  No current changes needed.   Lab Results  Component Value Date   TSH 0.58 08/03/2024

## 2024-08-05 NOTE — Assessment & Plan Note (Signed)
 She has normalized her BMI with use  GLP 1 agonist  and A1c is now < 6.0  cautioned to avoid excessive weight loss in the setting of recent surgery. .  She has no proteinuria and is taking a statin .  LDL is at goal  .   Lab Results  Component Value Date   HGBA1C 5.7 08/03/2024   Lab Results  Component Value Date   MICROALBUR 1.7 02/02/2024   MICROALBUR 0.4 09/18/2023     Lab Results  Component Value Date   CHOL 127 08/03/2024   HDL 30.70 (L) 08/03/2024   LDLCALC 41 08/03/2024   LDLDIRECT 58.0 08/03/2024   TRIG 274.0 (H) 08/03/2024   CHOLHDL 4 08/03/2024

## 2024-08-05 NOTE — Telephone Encounter (Signed)
 [facility] informing patient we have sent the request for patient to get a brace. She should hear from them to get set up.

## 2024-08-05 NOTE — Assessment & Plan Note (Signed)
 S/p surgical decompression Sept 26 with moderate relief of pain, still using opioids for postoperative pain relief but pain is improving

## 2024-08-05 NOTE — Assessment & Plan Note (Signed)
Symptoms and mood are stable on seroquel and zoloft.  No changes today

## 2024-08-06 ENCOUNTER — Ambulatory Visit: Payer: Self-pay | Admitting: Internal Medicine

## 2024-08-06 NOTE — Addendum Note (Signed)
 Addended by: MARYLYNN VERNEITA CROME on: 08/06/2024 07:59 PM   Modules accepted: Orders

## 2024-08-08 NOTE — Telephone Encounter (Signed)
 Noted

## 2024-08-10 DIAGNOSIS — M4714 Other spondylosis with myelopathy, thoracic region: Secondary | ICD-10-CM | POA: Diagnosis not present

## 2024-08-11 NOTE — Telephone Encounter (Signed)
 Copied from CRM [id number]. Topic: Clinical - Lab/Test Results >> Aug 11, 2024  3:37 PM Kathleen Carr wrote: Reason for CRM: Patient called back to go over the lab results. Results reviewed verbatim. Patient had no other questions and states she will call back to schedule for the repeat labs another time.

## 2024-08-11 NOTE — Progress Notes (Signed)
 LVM for pt stating to call the office back to receive results. Ok to give results and document

## 2024-08-12 ENCOUNTER — Telehealth: Payer: Self-pay

## 2024-08-12 NOTE — Transitions of Care (Post Inpatient/ED Visit) (Signed)
 Transition of Care week 3  Visit Note  08/12/2024  Name: Kathleen Carr MRN: [id number]          DOB: 26-Jun-1971  Situation: Patient enrolled in [facility] 30-day program. Visit completed with patient by telephone.   Background:   Initial Transition Care Management Follow-up Telephone Call Discharge Date and Diagnosis: 07/25/24, status post spinal fusion   Past Medical History:  Diagnosis Date   Allergy    Anxiety    Arthritis    Asthma    EKG reflects Incomplete Blockage   Bipolar 1 disorder (HCC)    Bipolar depression (HCC)    Carpal tunnel syndrome    Cat bite 01/14/2022   Chronic kidney disease    Chronic pain syndrome    Depression    Diabetes mellitus without complication (HCC) 2018   GERD (gastroesophageal reflux disease)    Hypertension    Hypothyroidism    Insomnia, unspecified type    Laceration of right thigh 05/21/2022   Morbid obesity (HCC) 08/05/2020   Body mass index is 30.11 kg/m.        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   Pneumonia    Postlaminectomy syndrome, lumbar region    PTSD (post-traumatic stress disorder)    Sleep apnea 2016   Smoker    Stroke [facility]    Thoracic disc herniation    Thoracic spondylosis with myelopathy    Thyroid  disease 2021   TIA (transient ischemic attack) 2024   possible TIA per patient back in 2024    Assessment: Patient Reported Symptoms: Cognitive Cognitive Status: No symptoms reported, Alert and oriented to person, place, and time, Insightful and able to interpret abstract concepts, Normal speech and language skills      Neurological Neurological Review of Symptoms: No symptoms reported    HEENT HEENT Symptoms Reported: No symptoms reported      Cardiovascular Cardiovascular Symptoms Reported: No symptoms reported    Respiratory Respiratory Symptoms Reported: No symptoms reported    Endocrine Endocrine Symptoms Reported: No symptoms reported    Gastrointestinal Gastrointestinal Symptoms Reported: No symptoms  reported      Genitourinary Genitourinary Symptoms Reported: No symptoms reported    Integumentary Integumentary Symptoms Reported: Other Other Integumentary Symptoms: status post lumbar fusion Additional Integumentary Details: Patient states she continues to have pain. Reports pain level is a 4.  Patient states she is only able to take tylenol  for pain. She states she was recently fitted for a back brace that she is currently wearing. Patient made aware that next follow up visit with neurosurgeon is 09/06/24. patient states her incision is healing well. Denies any signs of infection. Denies any falls. Skin Management Strategies: Routine screening, Medication therapy, Medical device  Musculoskeletal Musculoskelatal Symptoms Reviewed: No symptoms reported        Psychosocial Psychosocial Symptoms Reported: No symptoms reported         There were no vitals filed for this visit.  Medications Reviewed Today     Reviewed by Parvin Stetzer E, RN (Registered Nurse) on 08/12/24 at 1040  Med List Status: <None>   Medication Order Taking? Sig Documenting Provider Last Dose Status Informant  albuterol  (VENTOLIN  HFA) 108 (90 Base) MCG/ACT inhaler [id number] Yes Inhale 1-2 puffs into the lungs every 6 (six) hours as needed for wheezing or shortness of breath. [provider]  Active Self  ALPRAZolam  (XANAX ) 0.25 MG tablet [id number] Yes Take 1 tablet (0.25 mg total) by mouth at bedtime as needed for anxiety. Marylynn Verneita CROME, MD  Active   atorvastatin  (LIPITOR) 20 MG tablet [id number] Yes TAKE 1 TABLET BY MOUTH EVERY DAY Marylynn Verneita CROME, MD  Active   cyanocobalamin  (VITAMIN B12) 1000 MCG/ML injection [id number] Yes INJECT 1 ML (1,000 MCG TOTAL) INTO THE MUSCLE EVERY 30 DAYS. Melanee Annah BROCKS, MD  Active   docusate sodium  (COLACE) 100 MG capsule [id number] Yes Take 100 mg by mouth daily. [provider]  Active Self  levothyroxine  (SYNTHROID ) 50 MCG tablet [id number] Yes Take 1 tablet (50 mcg  total) by mouth daily before breakfast. Marylynn Verneita CROME, MD  Active   loratadine  (CLARITIN ) 10 MG tablet [id number] Yes Take 10 mg by mouth daily as needed for allergies. [provider]  Active Self  Magnesium  Glycinate 120 MG CAPS [id number] Yes Take 120 mg by mouth at bedtime. [provider]  Active Self  metFORMIN  (GLUCOPHAGE ) 1000 MG tablet [id number] Yes Take 1 tablet (1,000 mg total) by mouth 2 (two) times daily with a meal. Marylynn Verneita CROME, MD  Active   methocarbamol  (ROBAXIN ) 500 MG tablet [id number] Yes Take 1 tablet (500 mg total) by mouth every 6 (six) hours as needed for muscle spasms. Gregory Edsel Ruth, PA  Active   ondansetron  (ZOFRAN ) 4 MG tablet [id number] Yes Take 1 tablet (4 mg total) by mouth every 6 (six) hours as needed for nausea. Marylynn Verneita CROME, MD  Active Self  oxybutynin  (DITROPAN  XL) 15 MG 24 hr tablet [id number] Yes TAKE 1 TABLET BY MOUTH EVERYDAY AT BEDTIME Marylynn Verneita CROME, MD  Active   pantoprazole  (PROTONIX ) 40 MG tablet [id number] Yes TAKE 1 TABLET BY MOUTH EVERY DAY Tullo, Teresa L, MD  Active   polyethylene glycol powder (GLYCOLAX /MIRALAX ) 17 GM/SCOOP powder [id number] Yes Take 17 g by mouth daily as needed for mild constipation. Dissolve 1 capful (17g) in 4-8 ounces of liquid and take by mouth daily. Gregory Edsel Ruth, [place]  Active   QUEtiapine  (SEROQUEL ) 300 MG tablet [id number] Yes TAKE 1 TABLET BY MOUTH EVERYDAY AT BEDTIME Tullo, Teresa L, MD  Active   rOPINIRole  (REQUIP ) 0.25 MG tablet [id number] Yes Take 1 tablet (0.25 mg total) by mouth 3 (three) times daily. Marylynn Verneita CROME, MD  Active   senna (SENOKOT) 8.6 MG TABS tablet [id number] Yes Take 1 tablet (8.6 mg total) by mouth 2 (two) times daily. Gregory Edsel Ruth, [place]  Active   sertraline  (ZOLOFT ) 100 MG tablet [id number] Yes TAKE 1 TABLET BY MOUTH EVERY DAY Marylynn Verneita CROME, MD  Active     Discontinued 07/28/22 1620 (Change in therapy)     Discontinued 01/22/21 1321 (Not covered by the pt's insurance)    Syringe/Needle, Disp, (SYRINGE 3CC/25GX1) 25G X 1 3 ML MISC [id number] Yes 1 Syringe by Does not apply route every 30 (thirty) days. Melanee Annah BROCKS, MD  Active Self  tirzepatide  (MOUNJARO ) 5 MG/0.5ML Pen [id number] Yes Inject 5 mg into the skin once a week. Marylynn Verneita CROME, MD  Active             Goals Addressed             This Visit's Progress    VBCI Transitions of Care (TOC) Care Plan       Problems:  Recent Hospitalization for treatment of lumbar fusion  Goal:  Over the next 30 days, the patient will not experience hospital readmission  Interventions:  Transitions of Care: Post discharge activity limitations prescribed by provider reviewed Post-op wound/incision care reviewed with patient/caregiver Reviewed Signs and symptoms of  infection Assessed for ongoing constipation Reviewed pain level  Assessed for increase protein consumption as recommended by her primary care provider Medications reviewed and compliance discussed Reinforced importance of hydration, OTC meds for constipation and importance of getting up and moving around frequently.  Reviewed upcoming provider visits Assessed for infection symptoms to surgical site. Evaluation of current treatment plan related to lumbar infusion surgery    Patient Self Care Activities:  Call pharmacy for medication refills 3-7 days in advance of running out of medications Call provider office for new concerns or questions  Notify RN Care Manager of TOC call rescheduling needs Participate in Transition of Care Program/Attend TOC scheduled calls Take medications as prescribed   Take medications to help with constipation Increase protein consumption as recommended by your primary care provider.  Re-suggested protein supplement.  Plan:  Telephone follow up appointment with care management team member scheduled for:  Dericka Ostenson RN CM  08/18/2024 at 11am        Recommendation:   Continue Current Plan of Care  Follow  Up Plan:   Telephone follow-up in 1 week  Arvin Seip RN, BSN, CCM [place]  [facility], Population Health Case Manager Phone: [phone]

## 2024-08-12 NOTE — Patient Instructions (Signed)
 Visit Information  Thank you for taking time to visit with me today. Please don't hesitate to contact me if I can be of assistance to you before our next scheduled telephone appointment.  Our next appointment is by telephone on 08/18/24 at 11:00 am  Following is a copy of your care plan:   Goals Addressed             This Visit's Progress    [facility] Transitions of Care (TOC) Care Plan       Problems:  Recent Hospitalization for treatment of lumbar fusion  Goal:  Over the next 30 days, the patient will not experience hospital readmission  Interventions:  Transitions of Care: Post discharge activity limitations prescribed by provider reviewed Post-op wound/incision care reviewed with patient/caregiver Reviewed Signs and symptoms of infection Assessed for ongoing constipation Reviewed pain level  Assessed for increase protein consumption as recommended by her primary care provider Medications reviewed and compliance discussed Reinforced importance of hydration, OTC meds for constipation and importance of getting up and moving around frequently.  Reviewed upcoming provider visits Assessed for infection symptoms to surgical site. Evaluation of current treatment plan related to lumbar infusion surgery    Patient Self Care Activities:  Call pharmacy for medication refills 3-7 days in advance of running out of medications Call provider office for new concerns or questions  Notify RN Care Manager of TOC call rescheduling needs Participate in Transition of Care Program/Attend TOC scheduled calls Take medications as prescribed   Take medications to help with constipation Increase protein consumption as recommended by your primary care provider.  Re-suggested protein supplement.  Plan:  Telephone follow up appointment with care management team member scheduled for:  Berle Fitz RN CM  08/18/2024 at 11am        Patient verbalizes understanding of instructions and care plan  provided today and agrees to view in MyChart. Active MyChart status and patient understanding of how to access instructions and care plan via MyChart confirmed with patient.     The patient has been provided with contact information for the care management team and has been advised to call with any health related questions or concerns.   Please call the care guide team at [phone] if you need to cancel or reschedule your appointment.   Please call the Suicide and Crisis Lifeline: 988 call the [place]  National Suicide Prevention Lifeline: 270-401-3639 or TTY: 610-310-2131 TTY (289)715-9565) to talk to a trained counselor call 1-800-273-TALK (toll free, 24 hour hotline) if you are experiencing a Mental Health or Behavioral Health Crisis or need someone to talk to.  Arvin Seip RN, BSN, CCM [employer], Population Health Case Manager Phone: [phone]

## 2024-08-17 ENCOUNTER — Ambulatory Visit: Payer: Self-pay

## 2024-08-17 NOTE — Telephone Encounter (Signed)
 FYI Only or Action Required?: Action required by provider: request for appointment. Pt also needs enzymes checked: please call pt  Patient was last seen in primary care on 08/03/2024 by Marylynn Verneita CROME, MD.  Called Nurse Triage reporting Pain.  Symptoms began 3-4 days.  Interventions attempted: Nothing.  Symptoms are: gradually worsening.  Triage Disposition: Call EMS 911 Now  Patient/caregiver understands and will follow disposition?:       Copied from CRM #[id number]. Topic: Clinical - Red Word Triage >> Aug 17, 2024  2:26 PM Pinkey ORN wrote: Red Word that prompted transfer to Nurse Triage: Pain >> Aug 17, 2024  2:27 PM Pinkey ORN wrote: Patient states she's experiencing pain where her liver is supposed to be. Patient also mentions she needs to schedule to have her enzymes rechecked.  Reason for Disposition . Fever present > 3 days (72 hours)  Answer Assessment - Initial Assessment Questions 1. ONSET: When did the muscle aches or body pains start?      3 or 4 days 2. LOCATION: What part of your body is hurting? (e.g., entire body, arms, legs)      Liver area 3. SEVERITY: How bad is the pain? (Scale 1-10; or mild, moderate, severe)     4/10 and more and times 4. CAUSE: What do you think is causing the pains?     unsure 5. FEVER: Do you have a fever? If Yes, ask: What is your temperature, how was it measured, and  when did it start?      no 6. OTHER SYMPTOMS: Do you have any other symptoms? (e.g., chest pain, cold or flu symptoms, rash, weakness, weight loss)     swelling 7. PREGNANCY: Is there any chance you are pregnant? When was your last menstrual period?     na 8. TRAVEL: Have you traveled out of the country in the last month? (e.g., exposures, travel history)     Na  Pt also requesting enzymes rechecked.  Nurse checked PCP schedule and no openings until Dec: pt requesting to  be worked in with PCP and also per PCP pt needs to have enzymes  rechecked.  Pt would like call back with update on plan of care.  Protocols used: Muscle Aches and Body Pain-A-AH

## 2024-08-18 ENCOUNTER — Telehealth: Payer: Self-pay

## 2024-08-18 ENCOUNTER — Emergency Department
Admission: EM | Admit: 2024-08-18 | Discharge: 2024-08-19 | Disposition: A | Discharge status: Home / Self Care | Attending: Emergency Medicine | Admitting: Emergency Medicine

## 2024-08-18 DIAGNOSIS — I129 Hypertensive chronic kidney disease with stage 1 through stage 4 chronic kidney disease, or unspecified chronic kidney disease: Secondary | ICD-10-CM | POA: Diagnosis not present

## 2024-08-18 DIAGNOSIS — E119 Type 2 diabetes mellitus without complications: Secondary | ICD-10-CM | POA: Diagnosis not present

## 2024-08-18 DIAGNOSIS — J45909 Unspecified asthma, uncomplicated: Secondary | ICD-10-CM | POA: Insufficient documentation

## 2024-08-18 DIAGNOSIS — R109 Unspecified abdominal pain: Secondary | ICD-10-CM | POA: Diagnosis present

## 2024-08-18 DIAGNOSIS — N189 Chronic kidney disease, unspecified: Secondary | ICD-10-CM | POA: Diagnosis not present

## 2024-08-18 DIAGNOSIS — Z96641 Presence of right artificial hip joint: Secondary | ICD-10-CM | POA: Insufficient documentation

## 2024-08-18 DIAGNOSIS — R1011 Right upper quadrant pain: Secondary | ICD-10-CM | POA: Diagnosis not present

## 2024-08-18 DIAGNOSIS — K2900 Acute gastritis without bleeding: Secondary | ICD-10-CM | POA: Insufficient documentation

## 2024-08-18 DIAGNOSIS — R1010 Upper abdominal pain, unspecified: Secondary | ICD-10-CM

## 2024-08-18 DIAGNOSIS — F17200 Nicotine dependence, unspecified, uncomplicated: Secondary | ICD-10-CM | POA: Diagnosis not present

## 2024-08-18 DIAGNOSIS — E039 Hypothyroidism, unspecified: Secondary | ICD-10-CM | POA: Insufficient documentation

## 2024-08-18 LAB — URINALYSIS, ROUTINE W REFLEX MICROSCOPIC
Bilirubin Urine: NEGATIVE
Glucose, UA: NEGATIVE mg/dL
Hgb urine dipstick: NEGATIVE
Ketones, ur: NEGATIVE mg/dL
Nitrite: NEGATIVE
Protein, ur: NEGATIVE mg/dL
Specific Gravity, Urine: 1.008 (ref 1.005–1.030)
Squamous Epithelial / HPF: >50 /HPF (ref 0–5)
pH: 7 (ref 5.0–8.0)

## 2024-08-18 LAB — COMPREHENSIVE METABOLIC PANEL WITH GFR
ALT: 19 U/L (ref 0–44)
AST: 24 U/L (ref 15–41)
Albumin: 4.6 g/dL (ref 3.5–5.0)
Alkaline Phosphatase: 62 U/L (ref 38–126)
Anion gap: 15 (ref 5–15)
BUN: 16 mg/dL (ref 6–20)
CO2: 23 mmol/L (ref 22–32)
Calcium: 10 mg/dL (ref 8.9–10.3)
Chloride: 100 mmol/L (ref 98–111)
Creatinine, Ser: 1.1 mg/dL — ABNORMAL HIGH (ref 0.44–1.00)
GFR, Estimated: >60 mL/min (ref >60)
Glucose, Bld: 91 mg/dL (ref 70–99)
Potassium: 4 mmol/L (ref 3.5–5.1)
Sodium: 138 mmol/L (ref 135–145)
Total Bilirubin: 0.6 mg/dL (ref 0.0–1.2)
Total Protein: 7.9 g/dL (ref 6.5–8.1)

## 2024-08-18 LAB — CBC WITH DIFFERENTIAL/PLATELET
Abs Immature Granulocytes: 0.02 K/uL (ref 0.00–0.07)
Basophils Absolute: 0 K/uL (ref 0.0–0.1)
Basophils Relative: 1 %
Eosinophils Absolute: 0.2 K/uL (ref 0.0–0.5)
Eosinophils Relative: 3 %
HCT: 35.6 % — ABNORMAL LOW (ref 36.0–46.0)
Hemoglobin: 12 g/dL (ref 12.0–15.0)
Immature Granulocytes: 0 %
Lymphocytes Relative: 46 %
Lymphs Abs: 4 K/uL (ref 0.7–4.0)
MCH: 31.6 pg (ref 26.0–34.0)
MCHC: 33.7 g/dL (ref 30.0–36.0)
MCV: 93.7 fL (ref 80.0–100.0)
Monocytes Absolute: 0.5 K/uL (ref 0.1–1.0)
Monocytes Relative: 6 %
Neutro Abs: 4 K/uL (ref 1.7–7.7)
Neutrophils Relative %: 44 %
Platelets: 458 K/uL — ABNORMAL HIGH (ref 150–400)
RBC: 3.8 MIL/uL — ABNORMAL LOW (ref 3.87–5.11)
RDW: 12.7 % (ref 11.5–15.5)
WBC: 8.8 K/uL (ref 4.0–10.5)
nRBC: 0 % (ref 0.0–0.2)

## 2024-08-18 LAB — LIPASE, BLOOD: Lipase: 51 U/L (ref 11–51)

## 2024-08-18 MED ORDER — ONDANSETRON 4 MG PO TBDP
4.0000 mg | ORAL_TABLET | Freq: Once | ORAL | Status: AC
  Administered 2024-08-18: 4 mg via ORAL
  Filled 2024-08-18: qty 1

## 2024-08-18 NOTE — Telephone Encounter (Signed)
 Spoke with pt and scheduled pt to be seen tomorrow with Charanpreet Kaur, NP.   Pt stated that she is has been experiencing some RUQ pain for about 5 days. Pt stated that the pain is getting more intense with each day. Pt stated that she has had elevated liver enzymes in the past so she has stopped all tylenol  products. Pt was advised that if pain gets any worse or she develops any other symptoms to please not wait for her appt tomorrow to go to the ED.

## 2024-08-18 NOTE — ED Triage Notes (Signed)
 Pt arrived to ED d/t right sided abd pain x4 days ago. Pt states the pain got worse today and N/V began today as well. Pt states the pain is constant and it feels tight. Pt stated that she had a right hip replacement on sept, 26

## 2024-08-18 NOTE — Patient Instructions (Signed)
 Visit Information  Thank you for taking time to visit with me today. Please don't hesitate to contact me if I can be of assistance to you before our next scheduled telephone appointment.  Our next appointment is by telephone on 08/24/24  at 1 pm  Following is a copy of your care plan:   Goals Addressed             This Visit's Progress    [facility]BCI Transitions of Care (TOC) Care Plan       Problems:  Recent Hospitalization for treatment of lumbar fusion  Goal:  Over the next 30 days, the patient will not experience hospital readmission  Interventions:  Transitions of Care: Post discharge activity limitations prescribed by provider reviewed Post-op wound/incision care reviewed with patient/caregiver Reviewed Signs and symptoms of infection Assessed for ongoing constipation Reviewed pain level  Medications reviewed and compliance discussed Reinforced importance of hydration, OTC meds for constipation and importance of getting up and moving around frequently.  Reviewed upcoming provider visits Assessed for infection symptoms to surgical site. Evaluation of current treatment plan related to lumbar infusion surgery    Patient Self Care Activities:  Call pharmacy for medication refills 3-7 days in advance of running out of medications Call provider office for new concerns or questions  Notify RN Care Manager of TOC call rescheduling needs Participate in Transition of Care Program/Attend TOC scheduled calls Take medications as prescribed   Take medications to help with constipation  Plan:  Telephone follow up appointment with care management team member scheduled for:  Joren Rehm RN CM  08/23/2024 at 1pm        Patient verbalizes understanding of instructions and care plan provided today and agrees to view in MyChart. Active MyChart status and patient understanding of how to access instructions and care plan via MyChart confirmed with patient.     The patient has been  provided with contact information for the care management team and has been advised to call with any health related questions or concerns.   Please call the care guide team at [phone] if you need to cancel or reschedule your appointment.   Please call the Suicide and Crisis Lifeline: 988 call the [place]  National Suicide Prevention Lifeline: 6146234659 or TTY: 989 621 9931 TTY 984-734-7736) to talk to a trained counselor call 1-800-273-TALK (toll free, 24 hour hotline) if you are experiencing a Mental Health or Behavioral Health Crisis or need someone to talk to.  Arvin Seip RN, BSN, CCM [employer], Population Health Case Manager Phone: [phone]

## 2024-08-18 NOTE — Transitions of Care (Post Inpatient/ED Visit) (Signed)
 Transition of Care week 4  Visit Note  08/18/2024  Name: Kathleen Carr MRN: [id number]          DOB: 15-Feb-1971  Situation: Patient enrolled in [facility] 30-day program. Visit completed with patient by telephone.   Background:   Initial Transition Care Management Follow-up Telephone Call Discharge Date and Diagnosis: 07/25/24, status post spinal fusion   Past Medical History:  Diagnosis Date   Allergy    Anxiety    Arthritis    Asthma    EKG reflects Incomplete Blockage   Bipolar 1 disorder (HCC)    Bipolar depression (HCC)    Carpal tunnel syndrome    Cat bite 01/14/2022   Chronic kidney disease    Chronic pain syndrome    Depression    Diabetes mellitus without complication (HCC) 2018   GERD (gastroesophageal reflux disease)    Hypertension    Hypothyroidism    Insomnia, unspecified type    Laceration of right thigh 05/21/2022   Morbid obesity (HCC) 08/05/2020   Body mass index is 30.11 kg/m.        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   Pneumonia    Postlaminectomy syndrome, lumbar region    PTSD (post-traumatic stress disorder)    Sleep apnea 2016   Smoker    Stroke [facility])    Thoracic disc herniation    Thoracic spondylosis with myelopathy    Thyroid  disease 2021   TIA (transient ischemic attack) 2024   possible TIA per patient back in 2024    Assessment: Patient Reported Symptoms: Cognitive Cognitive Status: No symptoms reported, Difficulties with attention and concentration, Normal speech and language skills, Insightful and able to interpret abstract concepts, Alert and oriented to person, place, and time      Neurological Neurological Review of Symptoms: No symptoms reported    HEENT HEENT Symptoms Reported: No symptoms reported      Cardiovascular Cardiovascular Symptoms Reported: No symptoms reported    Respiratory Respiratory Symptoms Reported: No symptoms reported    Endocrine Endocrine Symptoms Reported: No symptoms reported    Gastrointestinal  Gastrointestinal Symptoms Reported: Abdominal pain or discomfort Additional Gastrointestinal Details: patient reports having right upper quad discomfort x 5 day. She states she notified her primary care provider office on yesterday and is scheduled to see her primary provider on 08/19/24.      Genitourinary Genitourinary Symptoms Reported: No symptoms reported    Integumentary Additional Integumentary Details: Patient reports her lumbar fusion site incision is healing well and is without signs of infection. She reports pain level is 2. Patient states she is not taking anymore tylenol  for now due to having the RUQ pain and elevated liver enzymes. She states she will discuss further with her primary care provider. Skin Management Strategies: Routine screening  Musculoskeletal Musculoskelatal Symptoms Reviewed: No symptoms reported        Psychosocial Psychosocial Symptoms Reported: No symptoms reported         There were no vitals filed for this visit.  Medications Reviewed Today     Reviewed by Edwen Mclester E, RN (Registered Nurse) on 08/18/24 at 1354  Med List Status: <None>   Medication Order Taking? Sig Documenting Provider Last Dose Status Informant  albuterol  (VENTOLIN  HFA) 108 (90 Base) MCG/ACT inhaler [id number] Yes Inhale 1-2 puffs into the lungs every 6 (six) hours as needed for wheezing or shortness of breath. [provider]  Active Self  ALPRAZolam  (XANAX ) 0.25 MG tablet [id number] Yes Take 1 tablet (0.25 mg total) by  mouth at bedtime as needed for anxiety. Marylynn Verneita CROME, MD  Active   atorvastatin  (LIPITOR) 20 MG tablet [id number] Yes TAKE 1 TABLET BY MOUTH EVERY DAY Tullo, Teresa L, MD  Active   cyanocobalamin  (VITAMIN B12) 1000 MCG/ML injection [id number] Yes INJECT 1 ML (1,000 MCG TOTAL) INTO THE MUSCLE EVERY 30 DAYS. Melanee Annah BROCKS, MD  Active   docusate sodium  (COLACE) 100 MG capsule [id number] Yes Take 100 mg by mouth daily. [provider]  Active Self   levothyroxine  (SYNTHROID ) 50 MCG tablet [id number] Yes Take 1 tablet (50 mcg total) by mouth daily before breakfast. Marylynn Verneita CROME, MD  Active   loratadine  (CLARITIN ) 10 MG tablet [id number] Yes Take 10 mg by mouth daily as needed for allergies. [provider]  Active Self  Magnesium  Glycinate 120 MG CAPS [id number] Yes Take 120 mg by mouth at bedtime. [provider]  Active Self  metFORMIN  (GLUCOPHAGE ) 1000 MG tablet [id number] Yes Take 1 tablet (1,000 mg total) by mouth 2 (two) times daily with a meal. Marylynn Verneita CROME, MD  Active   methocarbamol  (ROBAXIN ) 500 MG tablet [id number] Yes Take 1 tablet (500 mg total) by mouth every 6 (six) hours as needed for muscle spasms. Gregory Edsel Ruth, PA  Active   ondansetron  (ZOFRAN ) 4 MG tablet [id number] Yes Take 1 tablet (4 mg total) by mouth every 6 (six) hours as needed for nausea. Marylynn Verneita CROME, MD  Active Self  oxybutynin  (DITROPAN  XL) 15 MG 24 hr tablet [id number] Yes TAKE 1 TABLET BY MOUTH EVERYDAY AT BEDTIME Marylynn Verneita CROME, MD  Active   pantoprazole  (PROTONIX ) 40 MG tablet [id number] Yes TAKE 1 TABLET BY MOUTH EVERY DAY Tullo, Teresa L, MD  Active   polyethylene glycol powder (GLYCOLAX /MIRALAX ) 17 GM/SCOOP powder [id number] Yes Take 17 g by mouth daily as needed for mild constipation. Dissolve 1 capful (17g) in 4-8 ounces of liquid and take by mouth daily. Gregory Edsel Ruth, [place]  Active   QUEtiapine  (SEROQUEL ) 300 MG tablet [id number] Yes TAKE 1 TABLET BY MOUTH EVERYDAY AT BEDTIME Marylynn Verneita CROME, MD  Active   rOPINIRole  (REQUIP ) 0.25 MG tablet [id number] Yes Take 1 tablet (0.25 mg total) by mouth 3 (three) times daily. Marylynn Verneita CROME, MD  Active   senna (SENOKOT) 8.6 MG TABS tablet [id number] Yes Take 1 tablet (8.6 mg total) by mouth 2 (two) times daily. Gregory Edsel Ruth, [place]  Active   sertraline  (ZOLOFT ) 100 MG tablet [id number] Yes TAKE 1 TABLET BY MOUTH EVERY DAY Marylynn Verneita CROME, MD  Active     Discontinued 07/28/22 1620 (Change in  therapy)     Discontinued 01/22/21 1321 (Not covered by the pt's insurance)   Syringe/Needle, Disp, (SYRINGE 3CC/25GX1) 25G X 1 3 ML MISC [id number] Yes 1 Syringe by Does not apply route every 30 (thirty) days. Melanee Annah BROCKS, MD  Active Self  tirzepatide  (MOUNJARO ) 5 MG/0.5ML Pen [id number] Yes Inject 5 mg into the skin once a week. Marylynn Verneita CROME, MD  Active             Goals Addressed             This Visit's Progress    VBCI Transitions of Care (TOC) Care Plan       Problems:  Recent Hospitalization for treatment of lumbar fusion  Goal:  Over the next 30 days, the patient will not experience hospital readmission  Interventions:  Transitions of Care: Post discharge activity limitations prescribed by provider  reviewed Post-op wound/incision care reviewed with patient/caregiver Reviewed Signs and symptoms of infection Assessed for ongoing constipation Reviewed pain level  Medications reviewed and compliance discussed Reinforced importance of hydration, OTC meds for constipation and importance of getting up and moving around frequently.  Reviewed upcoming provider visits Assessed for infection symptoms to surgical site. Evaluation of current treatment plan related to lumbar infusion surgery    Patient Self Care Activities:  Call pharmacy for medication refills 3-7 days in advance of running out of medications Call provider office for new concerns or questions  Notify RN Care Manager of TOC call rescheduling needs Participate in Transition of Care Program/Attend TOC scheduled calls Take medications as prescribed   Take medications to help with constipation  Plan:  Telephone follow up appointment with care management team member scheduled for:  Alwin Lanigan RN CM  08/23/2024 at 1pm        Recommendation:   Continue Current Plan of Care  Follow Up Plan:   Telephone follow-up in 1 week  Arvin Seip RN, BSN, CCM [place]  [facility],  Population Health Case Manager Phone: [phone]

## 2024-08-19 ENCOUNTER — Emergency Department

## 2024-08-19 ENCOUNTER — Ambulatory Visit: Admitting: Nurse Practitioner

## 2024-08-19 DIAGNOSIS — R1031 Right lower quadrant pain: Secondary | ICD-10-CM | POA: Diagnosis not present

## 2024-08-19 DIAGNOSIS — K760 Fatty (change of) liver, not elsewhere classified: Secondary | ICD-10-CM | POA: Diagnosis not present

## 2024-08-19 DIAGNOSIS — R112 Nausea with vomiting, unspecified: Secondary | ICD-10-CM | POA: Diagnosis not present

## 2024-08-19 MED ORDER — PANTOPRAZOLE SODIUM 40 MG IV SOLR
40.0000 mg | Freq: Once | INTRAVENOUS | Status: AC
  Administered 2024-08-19: 40 mg via INTRAVENOUS
  Filled 2024-08-19: qty 10

## 2024-08-19 MED ORDER — IOHEXOL 300 MG/ML  SOLN
100.0000 mL | Freq: Once | INTRAMUSCULAR | Status: AC | PRN
  Administered 2024-08-19: 100 mL via INTRAVENOUS

## 2024-08-19 MED ORDER — MORPHINE SULFATE (PF) 4 MG/ML IV SOLN
4.0000 mg | Freq: Once | INTRAVENOUS | Status: AC
  Administered 2024-08-19: 4 mg via INTRAVENOUS
  Filled 2024-08-19: qty 1

## 2024-08-19 MED ORDER — ONDANSETRON HCL 4 MG/2ML IJ SOLN
4.0000 mg | Freq: Once | INTRAMUSCULAR | Status: AC
  Administered 2024-08-19: 4 mg via INTRAVENOUS
  Filled 2024-08-19: qty 2

## 2024-08-19 MED ORDER — FAMOTIDINE 20 MG PO TABS: 20.0000 mg | ORAL_TABLET | Freq: Two times a day (BID) | ORAL | 0 refills | Status: AC

## 2024-08-19 MED ORDER — METOCLOPRAMIDE HCL 10 MG PO TABS: 10.0000 mg | ORAL_TABLET | Freq: Four times a day (QID) | ORAL | 0 refills | Status: AC | PRN

## 2024-08-19 MED ORDER — ALUMINUM-MAGNESIUM-SIMETHICONE 200-200-20 MG/5ML PO SUSP: 30.0000 mL | Freq: Three times a day (TID) | ORAL | 0 refills | Status: AC

## 2024-08-19 MED ORDER — SODIUM CHLORIDE 0.9 % IV BOLUS
1000.0000 mL | Freq: Once | INTRAVENOUS | Status: AC
  Administered 2024-08-19: 1000 mL via INTRAVENOUS

## 2024-08-19 NOTE — ED Provider Notes (Signed)
 [facility] Provider Note    Event Date/Time   First MD Initiated Contact with Patient 08/19/24 0007     (approximate)   History   Chief Complaint: Abdominal Pain   HPI  Kathleen Carr is a 53 y.o. female with a history of diabetes, GERD, bipolar disorder, hypertension who comes ED complaining of right sided abdominal pain for the past 4 days, worse in the right upper quadrant under the ribs, hurts to move.  Had a cholecystectomy about 12 years ago.  No chest pain or shortness of breath, not pleuritic.  No fever.  Has had nausea and today also vomiting after eating.  Also reports a feeling of fullness in the throat and discomfort with drinking water         Past Medical History:  Diagnosis Date   Allergy    Anxiety    Arthritis    Asthma    EKG reflects Incomplete Blockage   Bipolar 1 disorder (HCC)    Bipolar depression (HCC)    Carpal tunnel syndrome    Cat bite 01/14/2022   Chronic kidney disease    Chronic pain syndrome    Depression    Diabetes mellitus without complication (HCC) 2018   GERD (gastroesophageal reflux disease)    Hypertension    Hypothyroidism    Insomnia, unspecified type    Laceration of right thigh 05/21/2022   Morbid obesity (HCC) 08/05/2020   Body mass index is 30.11 kg/m.        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   Pneumonia    Postlaminectomy syndrome, lumbar region    PTSD (post-traumatic stress disorder)    Sleep apnea 2016   Smoker    Stroke [facility])    Thoracic disc herniation    Thoracic spondylosis with myelopathy    Thyroid  disease 2021   TIA (transient ischemic attack) 2024   possible TIA per patient back in 2024    Current Outpatient Rx   Order #: [id number] Class: Normal   Order #: [id number] Class: Normal   Order #: [id number] Class: Normal   Order #: [id number] Class: Historical Med   Order #: [id number] Class: Normal   Order #: [id number] Class: Normal   Order #: [id number] Class: Normal   Order #:  [id number] Class: Historical Med   Order #: [id number] Class: Normal   Order #: [id number] Class: Historical Med   Order #: [id number] Class: Historical Med   Order #: [id number] Class: Normal   Order #: [id number] Class: Normal   Order #: [id number] Class: Normal   Order #: [id number] Class: Normal   Order #: [id number] Class: Normal   Order #: [id number] Class: Normal   Order #: [id number] Class: Normal   Order #: [id number] Class: Normal   Order #: [id number] Class: Normal   Order #: [id number] Class: Normal   Order #: [id number] Class: Normal   Order #: [id number] Class: Normal    Past Surgical History:  Procedure Laterality Date   ABDOMINAL HYSTERECTOMY     complete   ADENOIDECTOMY, TONSILLECTOMY AND MYRINGOTOMY WITH TUBE PLACEMENT  1977   APPLICATION OF INTRAOPERATIVE CT SCAN N/A 07/22/2024   Procedure: APPLICATION OF INTRAOPERATIVE CT SCAN;  Surgeon: Clois Fret, MD;  Location: [facility] ORS;  Service: Neurosurgery;  Laterality: N/A;   CARPAL TUNNEL RELEASE Right 03/12/2022   Procedure: RIGHT CARPAL TUNNEL RELEASE;  Surgeon: Romona Harari, MD;  Location: [facility];  Service: Orthopedics;  Laterality: Right;   CARPAL TUNNEL RELEASE Left 04/16/2022   Procedure: LEFT CARPAL TUNNEL RELEASE;  Surgeon: Romona Harari, MD;  Location: [facility];  Service: Orthopedics;  Laterality: Left;   CHOLECYSTECTOMY     COLONOSCOPY WITH PROPOFOL  N/A 02/05/2022   Procedure: COLONOSCOPY WITH PROPOFOL ;  Surgeon: Therisa Bi, MD;  Location: [facility];  Service: Gastroenterology;  Laterality: N/A;   ESOPHAGOGASTRODUODENOSCOPY N/A 02/05/2022   Procedure: ESOPHAGOGASTRODUODENOSCOPY (EGD);  Surgeon: Therisa Bi, MD;  Location: [facility] ENDOSCOPY;  Service: Gastroenterology;  Laterality: N/A;   HIP ARTHROSCOPY Right 08/19/2022   Procedure: Right hip arthroscopy, acetabuloplasty, labral repair, femoral osteochondroplasty, capsular closure;  Surgeon: Tobie Priest, MD;  Location: [facility];   Service: Orthopedics;  Laterality: Right;   JOINT REPLACEMENT  03/09/2023   Total Hip, Right   KNEE ARTHROSCOPY Right 1993   KNEE ARTHROSCOPY Left 1998   LUMBAR LAMINECTOMY Left 2021   SPINE SURGERY     THORACIC DISCECTOMY N/A 07/22/2024   Procedure: THORACIC DISCECTOMY;  Surgeon: Clois Fret, MD;  Location: [facility];  Service: Neurosurgery;  Laterality: N/A;  T7-8 TRANSPEDICULAR DISCECTOMY   THORACIC LAMINECTOMY FOR SPINAL CORD STIMULATOR N/A 02/17/2024   Procedure: THORACIC LAMINECTOMY FOR SPINAL CORD STIMULATOR;  Surgeon: Clois Fret, MD;  Location: [facility];  Service: Neurosurgery;  Laterality: N/A;  THORACIC LAMINRCTOMY FOR SPINAL CORD STIMULATOR PLACEMENT   TOTAL HIP ARTHROPLASTY Right 03/09/2023   Procedure: TOTAL HIP ARTHROPLASTY ANTERIOR APPROACH;  Surgeon: Lorelle Hussar, MD;  Location: [facility];  Service: Orthopedics;  Laterality: Right;    Physical Exam   Triage Vital Signs: ED Triage Vitals  Encounter Vitals Group     BP 08/18/24 2024 (!) 114/90     Girls Systolic BP Percentile --      Girls Diastolic BP Percentile --      Boys Systolic BP Percentile --      Boys Diastolic BP Percentile --      Pulse Rate 08/18/24 2024 79     Resp 08/18/24 2024 16     Temp 08/18/24 2024 99.1 F (37.3 C)     Temp Source 08/18/24 2024 Oral     SpO2 08/18/24 2024 100 %     Weight 08/18/24 2025 140 lb (63.5 kg)     Height 08/18/24 2025 5' 3 (1.6 m)     Head Circumference --      Peak Flow --      Pain Score 08/18/24 2037 6     Pain Loc --      Pain Education --      Exclude from Growth Chart --     Most recent vital signs: Vitals:   08/19/24 0030 08/19/24 0100  BP: 101/63 105/68  Pulse: 68 66  Resp:    Temp:    SpO2: 100% 98%    General: Awake, no distress.  CV:  Good peripheral perfusion.  Regular rate rhythm Resp:  Normal effort.  Clear lungs Abd:  No distention.  Soft with right upper quadrant tenderness Other:  No lower extremity edema.  Symmetric  calf circumference.   ED Results / Procedures / Treatments   Labs (all labs ordered are listed, but only abnormal results are displayed) Labs Reviewed  CBC WITH DIFFERENTIAL/PLATELET - Abnormal; Notable for the following components:      Result Value   RBC 3.80 (*)    HCT 35.6 (*)    Platelets 458 (*)    All other components within normal limits  COMPREHENSIVE METABOLIC PANEL WITH GFR - Abnormal; Notable for the following components:   Creatinine, Ser 1.10 (*)    All other components within normal limits  URINALYSIS, ROUTINE W REFLEX MICROSCOPIC -  Abnormal; Notable for the following components:   Color, Urine YELLOW (*)    APPearance CLOUDY (*)    Leukocytes,Ua SMALL (*)    Bacteria, UA RARE (*)    All other components within normal limits  LIPASE, BLOOD     EKG Interpreted by me Sinus rhythm rate of 82.  Normal axis intervals QRS ST segments T waves   RADIOLOGY CT abdomen pelvis interpreted by me, no bowel obstruction or free air.  Stomach extends into the right upper quadrant   PROCEDURES:  Procedures   MEDICATIONS ORDERED IN ED: Medications  ondansetron  (ZOFRAN -ODT) disintegrating tablet 4 mg (4 mg Oral Given 08/18/24 2043)  sodium chloride  0.9 % bolus 1,000 mL (0 mLs Intravenous Stopped 08/19/24 0244)  ondansetron  (ZOFRAN ) injection 4 mg (4 mg Intravenous Given 08/19/24 0054)  pantoprazole  (PROTONIX ) injection 40 mg (40 mg Intravenous Given 08/19/24 0054)  morphine  (PF) 4 MG/ML injection 4 mg (4 mg Intravenous Given 08/19/24 0055)  iohexol  (OMNIPAQUE ) 300 MG/ML solution 100 mL (100 mLs Intravenous Contrast Given 08/19/24 0134)     IMPRESSION / MDM / ASSESSMENT AND PLAN / ED COURSE  I reviewed the triage vital signs and the nursing notes.  DDx: Pancreatitis, internal hernia, ileus/bowel obstruction, gastritis  Patient's presentation is most consistent with acute presentation with potential threat to life or bodily function.  Patient presents with right  upper quadrant abdominal pain, tenderness on exam.  Vitals unremarkable.  Labs reassuring.  CT negative for acute findings.  With stomach extending in the right upper quadrant, I think this is compatible with gastritis.  Symptoms are supportive of acid reflux/GERD as well.  Patient is feeling better.  Will start on antacids, stable for discharge       FINAL CLINICAL IMPRESSION(S) / ED DIAGNOSES   Final diagnoses:  Pain of upper abdomen  Acute gastritis without hemorrhage, unspecified gastritis type     Rx / DC Orders   ED Discharge Orders          Ordered    famotidine (PEPCID) 20 MG tablet  2 times daily        08/19/24 0216    metoCLOPramide  (REGLAN ) 10 MG tablet  Every 6 hours PRN        08/19/24 0216    aluminum-magnesium  hydroxide-simethicone (MAALOX) 200-200-20 MG/5ML SUSP  3 times daily before meals & bedtime        08/19/24 0216             Note:  This document was prepared using Dragon voice recognition software and may include unintentional dictation errors.   Viviann Pastor, MD 08/19/24 [phone]

## 2024-08-19 NOTE — ED Notes (Signed)
 Patient transported to CT

## 2024-08-22 ENCOUNTER — Other Ambulatory Visit: Payer: Self-pay | Admitting: Internal Medicine

## 2024-08-23 ENCOUNTER — Telehealth: Payer: Self-pay

## 2024-08-23 NOTE — Patient Instructions (Signed)
 Visit Information  Thank you for taking time to visit with me today. Please don't hesitate to contact me if I can be of assistance to you before our next scheduled telephone appointment.  Our next appointment is by telephone on 09/01/24 at 2 pm  Following is a copy of your care plan:   Goals Addressed             This Visit's Progress    VBCI Transitions of Care (TOC) Care Plan       Problems:  Recent Hospitalization for treatment of lumbar fusion Abdominal pain x 5 day. ED visit on 08/18/24.  Goal:  Over the next 30 days, the patient will not experience hospital readmission  Interventions:  Transitions of Care: Post discharge activity limitations prescribed by provider reviewed Post-op wound/incision care reviewed with patient/caregiver Reviewed Signs and symptoms of infection Assessed for ongoing constipation.  Advised to notify provider if constipation does not improve.  Assessed for ongoing abdominal pain/ nausea and vomiting  Reviewed pain level  Medications reviewed and compliance discussed.  Discussed new medications Pepcid/ Reglan  added to treatment regimen from ED visit on 08/18/24 for abdominal pain.  Reviewed/ discussed Lipase labs/ liver enzymes Reinforced importance of hydration, OTC meds for constipation and importance of getting up and moving around frequently.  Reviewed upcoming provider visits Assessed for infection symptoms to surgical site. Evaluation of current treatment plan related to lumbar infusion surgery    Patient Self Care Activities:  Call pharmacy for medication refills 3-7 days in advance of running out of medications Call provider office for new concerns or questions  Notify RN Care Manager of TOC call rescheduling needs Participate in Transition of Care Program/Attend TOC scheduled calls Take medications as prescribed   Take medications to help with constipation Notify provider for any new/ ongoing symptoms. Notify provider for ongoing  constipation and /or increase in abdominal pain  Plan:  Telephone follow up appointment with care management team member scheduled for:  Biviana Saddler RN CM  11/02/2023 at 2pm        Patient verbalizes understanding of instructions and care plan provided today and agrees to view in MyChart. Active MyChart status and patient understanding of how to access instructions and care plan via MyChart confirmed with patient.     The patient has been provided with contact information for the care management team and has been advised to call with any health related questions or concerns.   Please call the care guide team at [phone] if you need to cancel or reschedule your appointment.   Please call the Suicide and Crisis Lifeline: 988 call the [place]  National Suicide Prevention Lifeline: (251) 706-2921 or TTY: 337 220 6546 TTY 540-355-3189) to talk to a trained counselor call 1-800-273-TALK (toll free, 24 hour hotline) if you are experiencing a Mental Health or Behavioral Health Crisis or need someone to talk to.  Arvin Seip RN, BSN, CCM [employer], Population Health Case Manager Phone: [phone]

## 2024-08-23 NOTE — Transitions of Care (Post Inpatient/ED Visit) (Signed)
 Transition of Care week 4  Visit Note  08/23/2024  Name: Kathleen Carr MRN: [id number]          DOB: Mar 18, 1971  Situation: Patient enrolled in [facility] 30-day program. Visit completed with patient by telephone.   Background:   Initial Transition Care Management Follow-up Telephone Call Discharge Date and Diagnosis: 07/25/24, status post spinal fusion   Past Medical History:  Diagnosis Date   Allergy    Anxiety    Arthritis    Asthma    EKG reflects Incomplete Blockage   Bipolar 1 disorder (HCC)    Bipolar depression (HCC)    Carpal tunnel syndrome    Cat bite 01/14/2022   Chronic kidney disease    Chronic pain syndrome    Depression    Diabetes mellitus without complication (HCC) 2018   GERD (gastroesophageal reflux disease)    Hypertension    Hypothyroidism    Insomnia, unspecified type    Laceration of right thigh 05/21/2022   Morbid obesity (HCC) 08/05/2020   Body mass index is 30.11 kg/m.        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   Pneumonia    Postlaminectomy syndrome, lumbar region    PTSD (post-traumatic stress disorder)    Sleep apnea 2016   Smoker    Stroke [facility])    Thoracic disc herniation    Thoracic spondylosis with myelopathy    Thyroid  disease 2021   TIA (transient ischemic attack) 2024   possible TIA per patient back in 2024    Assessment: Patient Reported Symptoms: Cognitive Cognitive Status: Alert and oriented to person, place, and time, Insightful and able to interpret abstract concepts, Normal speech and language skills      Neurological Neurological Review of Symptoms: No symptoms reported    HEENT HEENT Symptoms Reported: No symptoms reported      Cardiovascular Cardiovascular Symptoms Reported: No symptoms reported    Respiratory Respiratory Symptoms Reported: No symptoms reported    Endocrine Endocrine Symptoms Reported: No symptoms reported    Gastrointestinal Gastrointestinal Symptoms Reported: Abdominal pain or discomfort,  Nausea Additional Gastrointestinal Details: patient reports being seen in the ED on 08/18/24 for ongoing abdominal pain/ nausea/ vomiting. Patient states she was prescribed Reglan  and pepsid.  She states her abdominal pain and nausea have eased some.  She states she knows to follow up with her primary care provider if needed. Gastrointestinal Management Strategies: Medication therapy    Genitourinary Genitourinary Symptoms Reported: No symptoms reported    Integumentary Integumentary Symptoms Reported: Incision Additional Integumentary Details: Patient incision from lumbar fusion has healed well.  She states she is scheduled to follow up with the surgeon on 09/06/24 and to have xray.    Musculoskeletal Musculoskelatal Symptoms Reviewed: No symptoms reported        Psychosocial Psychosocial Symptoms Reported: No symptoms reported         There were no vitals filed for this visit.  Medications Reviewed Today     Reviewed by Samaa Ueda E, RN (Registered Nurse) on 08/23/24 at 1322  Med List Status: <None>   Medication Order Taking? Sig Documenting Provider Last Dose Status Informant  albuterol  (VENTOLIN  HFA) 108 (90 Base) MCG/ACT inhaler [id number] Yes Inhale 1-2 puffs into the lungs every 6 (six) hours as needed for wheezing or shortness of breath. [provider]  Active Self  ALPRAZolam  (XANAX ) 0.25 MG tablet [id number] Yes Take 1 tablet (0.25 mg total) by mouth at bedtime as needed for anxiety. Marylynn Verneita CROME, MD  Active   aluminum-magnesium  hydroxide-simethicone (MAALOX) 200-200-20 MG/5ML SUSP [id number] Yes Take 30 mLs by mouth 4 (four) times daily -  before meals and at bedtime. Viviann Pastor, MD  Active   atorvastatin  (LIPITOR) 20 MG tablet [id number] Yes TAKE 1 TABLET BY MOUTH EVERY DAY Marylynn Verneita CROME, MD  Active   cyanocobalamin  (VITAMIN B12) 1000 MCG/ML injection [id number] Yes INJECT 1 ML (1,000 MCG TOTAL) INTO THE MUSCLE EVERY 30 DAYS. Melanee Annah BROCKS, MD  Active    docusate sodium  (COLACE) 100 MG capsule [id number] Yes Take 100 mg by mouth daily. [provider]  Active Self  famotidine (PEPCID) 20 MG tablet [id number] Yes Take 1 tablet (20 mg total) by mouth 2 (two) times daily. Viviann Pastor, MD  Active   levothyroxine  (SYNTHROID ) 50 MCG tablet [id number] Yes Take 1 tablet (50 mcg total) by mouth daily before breakfast. Marylynn Verneita CROME, MD  Active   loratadine  (CLARITIN ) 10 MG tablet [id number] Yes Take 10 mg by mouth daily as needed for allergies. [provider]  Active Self  Magnesium  Glycinate 120 MG CAPS [id number] Yes Take 120 mg by mouth at bedtime. [provider]  Active Self  metFORMIN  (GLUCOPHAGE ) 1000 MG tablet [id number] Yes Take 1 tablet (1,000 mg total) by mouth 2 (two) times daily with a meal. Marylynn Verneita CROME, MD  Active   methocarbamol  (ROBAXIN ) 500 MG tablet [id number] Yes Take 1 tablet (500 mg total) by mouth every 6 (six) hours as needed for muscle spasms. Gregory Edsel Ruth, PA  Active   metoCLOPramide  (REGLAN ) 10 MG tablet [id number] Yes Take 1 tablet (10 mg total) by mouth every 6 (six) hours as needed. Viviann Pastor, MD  Active   ondansetron  (ZOFRAN ) 4 MG tablet [id number] Yes Take 1 tablet (4 mg total) by mouth every 6 (six) hours as needed for nausea. Marylynn Verneita CROME, MD  Active Self  oxybutynin  (DITROPAN  XL) 15 MG 24 hr tablet [id number] Yes TAKE 1 TABLET BY MOUTH EVERYDAY AT BEDTIME Marylynn Verneita CROME, MD  Active   pantoprazole  (PROTONIX ) 40 MG tablet [id number] Yes TAKE 1 TABLET BY MOUTH EVERY DAY Tullo, Teresa L, MD  Active   polyethylene glycol powder (GLYCOLAX /MIRALAX ) 17 GM/SCOOP powder [id number] Yes Take 17 g by mouth daily as needed for mild constipation. Dissolve 1 capful (17g) in 4-8 ounces of liquid and take by mouth daily. Gregory Edsel Ruth, PA  Active   QUEtiapine  (SEROQUEL ) 300 MG tablet [id number] Yes TAKE 1 TABLET BY MOUTH EVERYDAY AT BEDTIME Tullo, Teresa L, MD  Active   rOPINIRole  (REQUIP ) 0.25  MG tablet [id number] Yes Take 1 tablet (0.25 mg total) by mouth 3 (three) times daily. Marylynn Verneita CROME, MD  Active   senna (SENOKOT) 8.6 MG TABS tablet [id number] Yes Take 1 tablet (8.6 mg total) by mouth 2 (two) times daily. Gregory Edsel Ruth, [place]  Active   sertraline  (ZOLOFT ) 100 MG tablet [id number] Yes TAKE 1 TABLET BY MOUTH EVERY DAY Marylynn Verneita CROME, MD  Active     Discontinued 07/28/22 1620 (Change in therapy)     Discontinued 01/22/21 1321 (Not covered by the pt's insurance)   Syringe/Needle, Disp, (SYRINGE 3CC/25GX1) 25G X 1 3 ML MISC [id number] Yes 1 Syringe by Does not apply route every 30 (thirty) days. Melanee Annah BROCKS, MD  Active Self  tirzepatide  (MOUNJARO ) 5 MG/0.5ML Pen [id number] Yes Inject 5 mg into the skin once a week. Marylynn Verneita CROME, MD  Active  Goals Addressed             This Visit's Progress    VBCI Transitions of Care (TOC) Care Plan       Problems:  Recent Hospitalization for treatment of lumbar fusion Abdominal pain x 5 day. ED visit on 08/18/24.  Goal:  Over the next 30 days, the patient will not experience hospital readmission  Interventions:  Transitions of Care: Post discharge activity limitations prescribed by provider reviewed Post-op wound/incision care reviewed with patient/caregiver Reviewed Signs and symptoms of infection Assessed for ongoing constipation.  Advised to notify provider if constipation does not improve.  Assessed for ongoing abdominal pain/ nausea and vomiting  Reviewed pain level  Medications reviewed and compliance discussed.  Discussed new medications Pepcid/ Reglan  added to treatment regimen from ED visit on 08/18/24 for abdominal pain.  Reviewed/ discussed Lipase labs/ liver enzymes Reinforced importance of hydration, OTC meds for constipation and importance of getting up and moving around frequently.  Reviewed upcoming provider visits Assessed for infection symptoms to surgical site. Evaluation of current  treatment plan related to lumbar infusion surgery    Patient Self Care Activities:  Call pharmacy for medication refills 3-7 days in advance of running out of medications Call provider office for new concerns or questions  Notify RN Care Manager of TOC call rescheduling needs Participate in Transition of Care Program/Attend TOC scheduled calls Take medications as prescribed   Take medications to help with constipation Notify provider for any new/ ongoing symptoms. Notify provider for ongoing constipation and /or increase in abdominal pain  Plan:  Telephone follow up appointment with care management team member scheduled for:  Zabdiel Dripps RN CM  11/02/2023 at 2pm         Recommendation:   Continue Current Plan of Care  Follow Up Plan:   Telephone follow-up in 1 week  Arvin Seip RN, BSN, CCM [place]  [facility], Population Health Case Manager Phone: [phone]

## 2024-08-30 ENCOUNTER — Encounter: Admitting: Neurosurgery

## 2024-09-01 ENCOUNTER — Telehealth: Payer: Self-pay

## 2024-09-01 NOTE — Patient Instructions (Signed)
 Visit Information  Thank you for taking time to visit with me today. You have completed the 30 day TOC program. Please contact your provider for any new and/ or ongoing issues/ concerns.  Following is a copy of your care plan:   Goals Addressed             This Visit's Progress    COMPLETED: VBCI Transitions of Care (TOC) Care Plan       Patient has completed the 30 day TOC program.  Goals met.  Problems:  Recent Hospitalization for treatment of lumbar fusion Abdominal pain x 5 day. ED visit on 08/18/24.  Goal:  Over the next 30 days, the patient will not experience hospital readmission  Interventions:  Transitions of Care: Assessed for lumbar fusion wound healing Assessed for ongoing abdominal pain/ nausea and vomiting  Reviewed pain level  Medications reviewed and compliance discussed.   Reviewed upcoming provider visits Assessed for infection symptoms to surgical site. Discussed and offered ongoing follow up with longitudinal nurse case manager- patient declined.    Patient Self Care Activities:  Call pharmacy for medication refills 3-7 days in advance of running out of medications Call provider office for new concerns or questions  Take medications as prescribed   Notify provider for any new/ ongoing symptoms.   Plan:   No further follow up required: patient completed 30 day TOC program. Goals met.         Patient verbalizes understanding of instructions and care plan provided today and agrees to view in MyChart. Active MyChart status and patient understanding of how to access instructions and care plan via MyChart confirmed with patient.     The patient has been provided with contact information for the care management team and has been advised to call with any health related questions or concerns.   Please call the care guide team at [phone] if you need to cancel or reschedule your appointment.   Please call the Suicide and Crisis Lifeline: 988 call  the [place]  National Suicide Prevention Lifeline: (857)810-7451 or TTY: 218-274-4421 TTY (424) 704-0168) to talk to a trained counselor call 1-800-273-TALK (toll free, 24 hour hotline) if you are experiencing a Mental Health or Behavioral Health Crisis or need someone to talk to.  Arvin Seip RN, BSN, CCM [employer], Population Health Case Manager Phone: [phone]

## 2024-09-01 NOTE — Transitions of Care (Post Inpatient/ED Visit) (Signed)
 Transition of Care week #5  Visit Note  09/01/2024  Name: Kathleen Carr MRN: [id number]          DOB: Mar 11, 1971  Situation: Patient enrolled in [facility] 30-day program. Visit completed with patient by telephone.   Background:   Initial Transition Care Management Follow-up Telephone Call Discharge Date and Diagnosis: No data recorded   Past Medical History:  Diagnosis Date   Allergy    Anxiety    Arthritis    Asthma    EKG reflects Incomplete Blockage   Bipolar 1 disorder (HCC)    Bipolar depression (HCC)    Carpal tunnel syndrome    Cat bite 01/14/2022   Chronic kidney disease    Chronic pain syndrome    Depression    Diabetes mellitus without complication (HCC) 2018   GERD (gastroesophageal reflux disease)    Hypertension    Hypothyroidism    Insomnia, unspecified type    Laceration of right thigh 05/21/2022   Morbid obesity (HCC) 08/05/2020   Body mass index is 30.11 kg/m.        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   Pneumonia    Postlaminectomy syndrome, lumbar region    PTSD (post-traumatic stress disorder)    Sleep apnea 2016   Smoker    Stroke [facility])    Thoracic disc herniation    Thoracic spondylosis with myelopathy    Thyroid  disease 2021   TIA (transient ischemic attack) 2024   possible TIA per patient back in 2024    Assessment: Patient Reported Symptoms: Cognitive Cognitive Status: Alert and oriented to person, place, and time, Insightful and able to interpret abstract concepts, Normal speech and language skills      Neurological Neurological Review of Symptoms: No symptoms reported    HEENT HEENT Symptoms Reported: No symptoms reported      Cardiovascular Cardiovascular Symptoms Reported: No symptoms reported    Respiratory Respiratory Symptoms Reported: No symptoms reported    Endocrine Endocrine Symptoms Reported: No symptoms reported    Gastrointestinal Gastrointestinal Symptoms Reported: No symptoms reported Additional Gastrointestinal  Details: patient states gastrointestinal symptoms have resolved.  She states, I feel like I'm back to my normal self.      Genitourinary Genitourinary Symptoms Reported: No symptoms reported    Integumentary Integumentary Symptoms Reported: Incision Additional Integumentary Details: patient states incision from lumbar fusion has healed.  She states she has her wife to monitor it frequently.    Musculoskeletal Musculoskelatal Symptoms Reviewed: No symptoms reported        Psychosocial Psychosocial Symptoms Reported: No symptoms reported         There were no vitals filed for this visit.  Medications Reviewed Today     Reviewed by Youlanda Tomassetti E, RN (Registered Nurse) on 09/01/24 at 1353  Med List Status: <None>   Medication Order Taking? Sig Documenting Provider Last Dose Status Informant  albuterol  (VENTOLIN  HFA) 108 (90 Base) MCG/ACT inhaler [id number] Yes Inhale 1-2 puffs into the lungs every 6 (six) hours as needed for wheezing or shortness of breath. [provider]  Active Self  ALPRAZolam  (XANAX ) 0.25 MG tablet [id number] Yes Take 1 tablet (0.25 mg total) by mouth at bedtime as needed for anxiety. Marylynn Verneita CROME, MD  Active   aluminum-magnesium  hydroxide-simethicone (MAALOX) 200-200-20 MG/5ML SUSP [id number] Yes Take 30 mLs by mouth 4 (four) times daily -  before meals and at bedtime. Viviann Pastor, MD  Active   atorvastatin  (LIPITOR) 20 MG tablet [id number] Yes TAKE 1 TABLET BY  MOUTH EVERY DAY Marylynn Verneita CROME, MD  Active   cyanocobalamin  (VITAMIN B12) 1000 MCG/ML injection [id number] Yes INJECT 1 ML (1,000 MCG TOTAL) INTO THE MUSCLE EVERY 30 DAYS. Melanee Annah BROCKS, MD  Active   docusate sodium  (COLACE) 100 MG capsule [id number] Yes Take 100 mg by mouth daily. [provider]  Active Self  famotidine (PEPCID) 20 MG tablet [id number] Yes Take 1 tablet (20 mg total) by mouth 2 (two) times daily. Viviann Pastor, MD  Active   levothyroxine  (SYNTHROID ) 50 MCG  tablet [id number] Yes TAKE 1 TABLET BY MOUTH EVERY DAY BEFORE BREAKFAST Marylynn Verneita CROME, MD  Active   loratadine  (CLARITIN ) 10 MG tablet [id number] Yes Take 10 mg by mouth daily as needed for allergies. [provider]  Active Self  Magnesium  Glycinate 120 MG CAPS [id number] Yes Take 120 mg by mouth at bedtime. [provider]  Active Self  metFORMIN  (GLUCOPHAGE ) 1000 MG tablet [id number] Yes Take 1 tablet (1,000 mg total) by mouth 2 (two) times daily with a meal. Marylynn Verneita CROME, MD  Active   methocarbamol  (ROBAXIN ) 500 MG tablet [id number] Yes Take 1 tablet (500 mg total) by mouth every 6 (six) hours as needed for muscle spasms. Gregory Edsel Ruth, PA  Active   metoCLOPramide  (REGLAN ) 10 MG tablet [id number] Yes Take 1 tablet (10 mg total) by mouth every 6 (six) hours as needed. Viviann Pastor, MD  Active   ondansetron  (ZOFRAN ) 4 MG tablet [id number] Yes Take 1 tablet (4 mg total) by mouth every 6 (six) hours as needed for nausea. Tullo, Teresa L, MD  Active Self  oxybutynin  (DITROPAN  XL) 15 MG 24 hr tablet [id number] Yes TAKE 1 TABLET BY MOUTH EVERYDAY AT BEDTIME Marylynn Verneita CROME, MD  Active   pantoprazole  (PROTONIX ) 40 MG tablet [id number] Yes TAKE 1 TABLET BY MOUTH EVERY DAY Tullo, Teresa L, MD  Active   polyethylene glycol powder (GLYCOLAX /MIRALAX ) 17 GM/SCOOP powder [id number] Yes Take 17 g by mouth daily as needed for mild constipation. Dissolve 1 capful (17g) in 4-8 ounces of liquid and take by mouth daily. Gregory Edsel Ruth, [place]  Active   QUEtiapine  (SEROQUEL ) 300 MG tablet [id number] Yes TAKE 1 TABLET BY MOUTH EVERYDAY AT BEDTIME Tullo, Teresa L, MD  Active   rOPINIRole  (REQUIP ) 0.25 MG tablet [id number] Yes Take 1 tablet (0.25 mg total) by mouth 3 (three) times daily. Marylynn Verneita CROME, MD  Active   senna (SENOKOT) 8.6 MG TABS tablet [id number] Yes Take 1 tablet (8.6 mg total) by mouth 2 (two) times daily. Gregory Edsel Ruth, [place]  Active   sertraline  (ZOLOFT ) 100 MG tablet [id number] Yes  TAKE 1 TABLET BY MOUTH EVERY DAY Marylynn Verneita CROME, MD  Active     Discontinued 07/28/22 1620 (Change in therapy)     Discontinued 01/22/21 1321 (Not covered by the pt's insurance)   Syringe/Needle, Disp, (SYRINGE 3CC/25GX1) 25G X 1 3 ML MISC [id number] Yes 1 Syringe by Does not apply route every 30 (thirty) days. Melanee Annah BROCKS, MD  Active Self  tirzepatide  (MOUNJARO ) 5 MG/0.5ML Pen [id number] Yes Inject 5 mg into the skin once a week. Marylynn Verneita CROME, MD  Active             Goals Addressed             This Visit's Progress    COMPLETED: [facility] Transitions of Care (TOC) Care Plan       Patient has completed the 30 day TOC program.  Goals  met.  Problems:  Recent Hospitalization for treatment of lumbar fusion Abdominal pain x 5 day. ED visit on 08/18/24.  Goal:  Over the next 30 days, the patient will not experience hospital readmission  Interventions:  Transitions of Care: Assessed for lumbar fusion wound healing Assessed for ongoing abdominal pain/ nausea and vomiting  Reviewed pain level  Medications reviewed and compliance discussed.   Reviewed upcoming provider visits Assessed for infection symptoms to surgical site. Discussed and offered ongoing follow up with longitudinal nurse case manager- patient declined.    Patient Self Care Activities:  Call pharmacy for medication refills 3-7 days in advance of running out of medications Call provider office for new concerns or questions  Take medications as prescribed   Notify provider for any new/ ongoing symptoms.   Plan:   No further follow up required: patient completed 30 day TOC program. Goals met.          Recommendation:   Continue Current Plan of Care  Follow Up Plan:   Closing From:  Transitions of Care Program. Goals met  Arvin Seip RN, BSN, CCM [place]  [facility], Population Health Case Manager Phone: [phone]

## 2024-09-06 ENCOUNTER — Other Ambulatory Visit: Payer: Self-pay | Admitting: Physician Assistant

## 2024-09-06 ENCOUNTER — Ambulatory Visit

## 2024-09-06 ENCOUNTER — Ambulatory Visit (INDEPENDENT_AMBULATORY_CARE_PROVIDER_SITE_OTHER): Admitting: Neurosurgery

## 2024-09-06 ENCOUNTER — Telehealth: Payer: Self-pay | Admitting: Neurosurgery

## 2024-09-06 ENCOUNTER — Encounter: Payer: Self-pay | Admitting: Neurosurgery

## 2024-09-06 VITALS — BP 120/76 | Temp 98.1°F | Ht 63.0 in | Wt 140.0 lb

## 2024-09-06 DIAGNOSIS — M5124 Other intervertebral disc displacement, thoracic region: Secondary | ICD-10-CM

## 2024-09-06 DIAGNOSIS — M4714 Other spondylosis with myelopathy, thoracic region: Secondary | ICD-10-CM | POA: Diagnosis not present

## 2024-09-06 DIAGNOSIS — R29898 Other symptoms and signs involving the musculoskeletal system: Secondary | ICD-10-CM

## 2024-09-06 DIAGNOSIS — Z4789 Encounter for other orthopedic aftercare: Secondary | ICD-10-CM | POA: Diagnosis not present

## 2024-09-06 DIAGNOSIS — Z981 Arthrodesis status: Secondary | ICD-10-CM | POA: Diagnosis not present

## 2024-09-06 DIAGNOSIS — M4804 Spinal stenosis, thoracic region: Secondary | ICD-10-CM | POA: Diagnosis not present

## 2024-09-06 DIAGNOSIS — Z09 Encounter for follow-up examination after completed treatment for conditions other than malignant neoplasm: Secondary | ICD-10-CM

## 2024-09-06 DIAGNOSIS — M47814 Spondylosis without myelopathy or radiculopathy, thoracic region: Secondary | ICD-10-CM | POA: Diagnosis not present

## 2024-09-06 MED ORDER — OXYCODONE HCL 5 MG PO TABS: 5.0000 mg | ORAL_TABLET | Freq: Four times a day (QID) | ORAL | 0 refills | Status: AC | PRN

## 2024-09-06 MED ORDER — OXYCODONE HCL 5 MG PO TABS: 5.0000 mg | ORAL_TABLET | Freq: Four times a day (QID) | ORAL | 0 refills | Status: DC | PRN

## 2024-09-06 NOTE — Telephone Encounter (Signed)
 Spoke with Alan, at [facility], they do not get Oxycodone  5 mg or 10 mg doses at that location. They cancelled patient's RX that was sent in today. Patient states this refill should be sent to [facility] [address] location instead. Please resend

## 2024-09-06 NOTE — Telephone Encounter (Signed)
 Noted, patient aware RX sent in today

## 2024-09-06 NOTE — Progress Notes (Signed)
   REFERRING PHYSICIAN:  Marylynn Verneita CROME, Md [address] [place],  [place] [address]  DOS: 07/22/24, T7-8 discectomy and fusion for thoracic myelopathy.   HISTORY OF PRESENT ILLNESS: Kathleen Carr is status post T7-8 discectomy and fusion for thoracic myelopathy. she is doing well.  She has some imbalance and some pain in between her shoulder blades.     PHYSICAL EXAMINATION:  General: Patient is well developed, well nourished, calm, collected, and in no apparent distress.   NEUROLOGICAL:  General: In no acute distress.   Awake, alert, oriented to person, place, and time.  Pupils equal round and reactive to light.  Facial tone is symmetric.     Strength:         Side Iliopsoas Quads Hamstring PF DF EHL  R 4+ 5 5 5 5 5   L 5 5 5 5 5 5    Incision c/d/i   ROS (Neurologic):  Negative except as noted above  IMAGING: No complications noted  ASSESSMENT/PLAN:  Kathleen Carr is doing well after T7-8 discectomy fusion for thoracic myelopathy.  Her stimulator is not working quite as well.  I will ask the rep's to reach out to her.  I will refill her pain meds 1 additional time.  Will start some physical therapy for her balance.  We reviewed activity limitations.  Reeves Daisy MD Department of neurosurgery

## 2024-09-06 NOTE — Telephone Encounter (Signed)
 Pharmacy just called and informed that this medication cannot be dispensed at this location. Pharmacy is going to call the pt.

## 2024-09-09 ENCOUNTER — Other Ambulatory Visit: Payer: Self-pay | Admitting: Oncology

## 2024-09-09 DIAGNOSIS — E538 Deficiency of other specified B group vitamins: Secondary | ICD-10-CM

## 2024-10-05 ENCOUNTER — Other Ambulatory Visit: Payer: Self-pay | Admitting: Physician Assistant

## 2024-10-05 DIAGNOSIS — M47814 Spondylosis without myelopathy or radiculopathy, thoracic region: Secondary | ICD-10-CM

## 2024-10-11 ENCOUNTER — Other Ambulatory Visit

## 2024-10-11 ENCOUNTER — Encounter: Admitting: Physician Assistant

## 2024-11-08 ENCOUNTER — Encounter: Payer: Self-pay | Admitting: Physician Assistant

## 2024-11-08 ENCOUNTER — Ambulatory Visit: Admitting: Physician Assistant

## 2024-11-08 ENCOUNTER — Ambulatory Visit

## 2024-11-08 VITALS — BP 122/70 | Ht 64.0 in | Wt 130.5 lb

## 2024-11-08 DIAGNOSIS — M542 Cervicalgia: Secondary | ICD-10-CM

## 2024-11-08 DIAGNOSIS — M5412 Radiculopathy, cervical region: Secondary | ICD-10-CM

## 2024-11-08 DIAGNOSIS — R2689 Other abnormalities of gait and mobility: Secondary | ICD-10-CM

## 2024-11-08 DIAGNOSIS — W19XXXA Unspecified fall, initial encounter: Secondary | ICD-10-CM | POA: Diagnosis not present

## 2024-11-08 DIAGNOSIS — Z981 Arthrodesis status: Secondary | ICD-10-CM

## 2024-11-08 DIAGNOSIS — M47814 Spondylosis without myelopathy or radiculopathy, thoracic region: Secondary | ICD-10-CM | POA: Diagnosis not present

## 2024-11-08 MED ORDER — METHYLPREDNISOLONE 4 MG PO TBPK: ORAL_TABLET | ORAL | 0 refills | Status: AC

## 2024-11-08 NOTE — Progress Notes (Signed)
" ° °  REFERRING PHYSICIAN:  Marylynn Verneita CROME, Md [address] [place],  [place] [address]  DOS: 07/22/24, T7-8 discectomy and fusion for thoracic myelopathy.   HISTORY OF PRESENT ILLNESS: Kathleen Carr is status post T7-8 discectomy and fusion for thoracic myelopathy. she is doing well.  She has some imbalance and some pain in between her shoulder blades.  Unfortunately she is having increased gait instability and pain.  She describes a fall in October when she was getting out of the shower in which she hit her side and back.  Since then her neck pain has become significantly worse and she started experiencing increased arm weakness, pain, numbness and tingling.  She started dropping things and falling more as a result of her gait instability.  Is been taking Robaxin  and Tylenol  for her pain.    PHYSICAL EXAMINATION:  General: Patient is well developed, well nourished, calm, collected, and in no apparent distress.   NEUROLOGICAL:  General: In no acute distress.   Awake, alert, oriented to person, place, and time.  Pupils equal round and reactive to light.  Facial tone is symmetric.     Strength:  Patient has some upper extremity weakness in her hands at a 4/5.  Appears to have good strength proximally, but giveaway weakness is noted.         Side Iliopsoas Quads Hamstring PF DF EHL  R 4+ 5 5 5 5 5   L 5 5 5 5 5 5    Incision c/d/i   ROS (Neurologic):  Negative except as noted above  IMAGING: No complications noted  ASSESSMENT/PLAN:  Kathleen Carr is doing okay after T7-8 discectomy fusion for thoracic myelopathy.  She unfortunately had a fall and is having increased neck pain, arm numbness, weakness, tingling.  Also noticed changes to her gait in which she is more unstable and falling.  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  Plan includes the following:  -X-rays today of cervical spine - MRI cervical spine to evaluate for any significant stenosis or myelopathy -  Patient has issues with her previous physical therapy referral.  Will check on this and also send in new PT order - Encouraged patient to call spinal cord stimulator rep to fine to this for her - Plan for short dose of steroids to help with her pain.  Will review imaging once complete.  Encouraged to reach out to us  in the meantime for any questions or concerns.    Lyle Decamp Department of neurosurgery    "

## 2024-11-11 ENCOUNTER — Encounter: Payer: Self-pay | Admitting: Physician Assistant

## 2024-11-15 ENCOUNTER — Inpatient Hospital Stay: Admission: RE | Admit: 2024-11-15 | Source: Ambulatory Visit

## 2024-11-16 NOTE — Addendum Note (Signed)
 Addended by: MARQUES-DAVIS, Saprina Chuong M on: 11/16/2024 05:12 PM   Modules accepted: Orders

## 2024-11-23 ENCOUNTER — Ambulatory Visit
Admission: RE | Admit: 2024-11-23 | Discharge: 2024-11-23 | Disposition: A | Discharge status: Home / Self Care | Source: Ambulatory Visit | Attending: Physician Assistant | Admitting: Physician Assistant

## 2024-11-23 DIAGNOSIS — M542 Cervicalgia: Secondary | ICD-10-CM

## 2024-11-23 DIAGNOSIS — M4802 Spinal stenosis, cervical region: Secondary | ICD-10-CM | POA: Insufficient documentation

## 2024-11-23 DIAGNOSIS — W19XXXA Unspecified fall, initial encounter: Secondary | ICD-10-CM | POA: Insufficient documentation

## 2024-11-23 DIAGNOSIS — M47812 Spondylosis without myelopathy or radiculopathy, cervical region: Secondary | ICD-10-CM | POA: Diagnosis not present

## 2024-11-23 DIAGNOSIS — M50321 Other cervical disc degeneration at C4-C5 level: Secondary | ICD-10-CM | POA: Insufficient documentation

## 2024-11-24 ENCOUNTER — Ambulatory Visit: Payer: Self-pay | Admitting: Physician Assistant

## 2025-01-06 ENCOUNTER — Encounter: Admitting: Internal Medicine

## 2025-02-01 ENCOUNTER — Ambulatory Visit: Admitting: Internal Medicine
